# Patient Record
Sex: Female | Born: 1960 | Race: White | Hispanic: No | Smoking: Never smoker
Health system: Southern US, Community
[De-identification: ages and names within clinical notes are randomized; demographics above are authoritative.]

## PROBLEM LIST (undated history)

## (undated) DIAGNOSIS — M5030 Other cervical disc degeneration, unspecified cervical region: Secondary | ICD-10-CM

## (undated) DIAGNOSIS — N19 Unspecified kidney failure: Secondary | ICD-10-CM

## (undated) DIAGNOSIS — N289 Disorder of kidney and ureter, unspecified: Secondary | ICD-10-CM

## (undated) DIAGNOSIS — R569 Unspecified convulsions: Secondary | ICD-10-CM

## (undated) DIAGNOSIS — M961 Postlaminectomy syndrome, not elsewhere classified: Secondary | ICD-10-CM

## (undated) DIAGNOSIS — R519 Headache, unspecified: Secondary | ICD-10-CM

## (undated) DIAGNOSIS — C649 Malignant neoplasm of unspecified kidney, except renal pelvis: Secondary | ICD-10-CM

## (undated) DIAGNOSIS — K219 Gastro-esophageal reflux disease without esophagitis: Secondary | ICD-10-CM

## (undated) DIAGNOSIS — R011 Cardiac murmur, unspecified: Secondary | ICD-10-CM

## (undated) DIAGNOSIS — C679 Malignant neoplasm of bladder, unspecified: Secondary | ICD-10-CM

## (undated) DIAGNOSIS — M1990 Unspecified osteoarthritis, unspecified site: Secondary | ICD-10-CM

## (undated) DIAGNOSIS — G9050 Complex regional pain syndrome I, unspecified: Secondary | ICD-10-CM

## (undated) DIAGNOSIS — F329 Major depressive disorder, single episode, unspecified: Secondary | ICD-10-CM

## (undated) DIAGNOSIS — M797 Fibromyalgia: Secondary | ICD-10-CM

## (undated) DIAGNOSIS — F32A Depression, unspecified: Secondary | ICD-10-CM

## (undated) DIAGNOSIS — I10 Essential (primary) hypertension: Secondary | ICD-10-CM

## (undated) DIAGNOSIS — G500 Trigeminal neuralgia: Secondary | ICD-10-CM

## (undated) DIAGNOSIS — K259 Gastric ulcer, unspecified as acute or chronic, without hemorrhage or perforation: Secondary | ICD-10-CM

## (undated) DIAGNOSIS — K589 Irritable bowel syndrome without diarrhea: Secondary | ICD-10-CM

## (undated) DIAGNOSIS — F419 Anxiety disorder, unspecified: Secondary | ICD-10-CM

## (undated) DIAGNOSIS — E785 Hyperlipidemia, unspecified: Secondary | ICD-10-CM

## (undated) HISTORY — DX: Anxiety disorder, unspecified: F41.9

## (undated) HISTORY — DX: Hyperlipidemia, unspecified: E78.5

## (undated) HISTORY — DX: Gastro-esophageal reflux disease without esophagitis: K21.9

## (undated) HISTORY — DX: Complex regional pain syndrome I, unspecified: G90.50

## (undated) HISTORY — DX: Irritable bowel syndrome, unspecified: K58.9

## (undated) HISTORY — PX: CHOLECYSTECTOMY: SHX55

## (undated) HISTORY — DX: Depression, unspecified: F32.A

## (undated) HISTORY — DX: Essential (primary) hypertension: I10

## (undated) HISTORY — DX: Fibromyalgia: M79.7

## (undated) HISTORY — DX: Trigeminal neuralgia: G50.0

## (undated) HISTORY — DX: Gastric ulcer, unspecified as acute or chronic, without hemorrhage or perforation: K25.9

## (undated) HISTORY — DX: Unspecified osteoarthritis, unspecified site: M19.90

## (undated) HISTORY — DX: Postlaminectomy syndrome, not elsewhere classified: M96.1

## (undated) HISTORY — DX: Cardiac murmur, unspecified: R01.1

## (undated) HISTORY — DX: Unspecified kidney failure: N19

## (undated) HISTORY — DX: Unspecified convulsions: R56.9

## (undated) HISTORY — DX: Malignant neoplasm of unspecified kidney, except renal pelvis: C64.9

## (undated) HISTORY — PX: TRANSURETHRAL RESECTION OF BLADDER TUMOR WITH GYRUS (TURBT-GYRUS): SHX6458

## (undated) HISTORY — DX: Other cervical disc degeneration, unspecified cervical region: M50.30

## (undated) HISTORY — DX: Malignant neoplasm of bladder, unspecified: C67.9

## (undated) HISTORY — DX: Major depressive disorder, single episode, unspecified: F32.9

---

## 1993-04-29 HISTORY — PX: BACK SURGERY: SHX140

## 2002-04-29 HISTORY — PX: BRAIN SURGERY: SHX531

## 2002-04-29 HISTORY — PX: SPINAL CORD STIMULATOR IMPLANT: SHX2422

## 2002-04-29 HISTORY — PX: OTHER SURGICAL HISTORY: SHX169

## 2004-04-29 HISTORY — PX: OTHER SURGICAL HISTORY: SHX169

## 2009-04-29 HISTORY — PX: NEPHROURETERECTOMY: SHX5135

## 2015-04-30 HISTORY — PX: BREAST BIOPSY: SHX20

## 2018-01-08 ENCOUNTER — Ambulatory Visit (INDEPENDENT_AMBULATORY_CARE_PROVIDER_SITE_OTHER): Payer: Medicaid Other | Admitting: Urology

## 2018-01-08 ENCOUNTER — Encounter: Payer: Self-pay | Admitting: Urology

## 2018-01-08 VITALS — BP 145/78 | HR 91 | Ht 64.0 in | Wt 193.0 lb

## 2018-01-08 DIAGNOSIS — R32 Unspecified urinary incontinence: Secondary | ICD-10-CM

## 2018-01-08 DIAGNOSIS — C689 Malignant neoplasm of urinary organ, unspecified: Secondary | ICD-10-CM

## 2018-01-08 LAB — URINALYSIS, COMPLETE
BILIRUBIN UA: NEGATIVE
Glucose, UA: NEGATIVE
Ketones, UA: NEGATIVE
Leukocytes, UA: NEGATIVE
NITRITE UA: NEGATIVE
PH UA: 7 (ref 5.0–7.5)
Protein, UA: NEGATIVE
RBC UA: NEGATIVE
SPEC GRAV UA: 1.02 (ref 1.005–1.030)
Urobilinogen, Ur: 0.2 mg/dL (ref 0.2–1.0)

## 2018-01-08 LAB — BLADDER SCAN AMB NON-IMAGING

## 2018-01-08 MED ORDER — MIRABEGRON ER 25 MG PO TB24: 25.0000 mg | ORAL_TABLET | Freq: Every day | ORAL | 11 refills | Status: DC

## 2018-01-08 NOTE — Progress Notes (Addendum)
01/08/2018 3:08 PM   Lindsay Austin 1960/09/15 [id number]  Referring provider: Tandy Gaw, [address] [place], [place] [address]  CC: Establish care, history of urothelial cell cancer  HPI: I had the pleasure of seeing Lindsay Austin in urology clinic today in consultation for history of urothelial cell cancer from [name], [place].  She recently moved to the area from [place].  Her urologic history is notable for a left nephroureterectomy in 2011 at [facility] for presumed urothelial cell cancer.  Her pathology is not available to me currently, but we will obtain these records.  She also underwent follow-up TURBT in 2012 and 2013, as well as BCG.  She had been on surveillance with cystoscopy every 6 months, and CT scan yearly.  Her last CT scan was October 2018, she is also due for cystoscopy.  She denies any gross hematuria or flank pain.  She is a non-smoker.  She also reports urinary frequency and stress incontinence.  She denies urge incontinence.  She has been on Myrbetriq which she feels like helps her urgency significantly.  She reportedly had been followed for a cystocele and rectocele by a uro-gynecologist at [facility], and was planning to undergo surgery for this prior to her move.  Her primary complaint is feeling of pelvic pressure and prolapse.   PMH: Past Medical History:  Diagnosis Date  . Anxiety   . Arthritis   . Bladder cancer ([place])   . Cancer of kidney ([place])   . Depression   . GERD (gastroesophageal reflux disease)   . Heart murmur   . Hyperlipemia   . Hypertension   . Kidney failure   . Seizure ([place])   . Stomach ulcer     Surgical History: Past Surgical History:  Procedure Laterality Date  . NEPHROURETERECTOMY  2011  . TRANSURETHRAL RESECTION OF BLADDER TUMOR WITH GYRUS (TURBT-GYRUS)  2012, 2013    Allergies: No Known Allergies  Family History: History reviewed. No pertinent family history.  Social History:  reports that she has never  smoked. She has never used smokeless tobacco. She reports that she does not drink alcohol or use drugs.  ROS: Please see flowsheet from today's date for complete review of systems.  Physical Exam: BP (!) 145/78   Pulse 91   Ht 5\' 4"  (1.626 m)   Wt 193 lb (87.5 kg)   BMI 33.13 kg/m    Constitutional:  Alert and oriented, No acute distress. Cardiovascular: No clubbing, cyanosis, or edema. Respiratory: Normal respiratory effort, no increased work of breathing. GI: Abdomen is soft, nontender, nondistended, no abdominal masses GU: No CVA tenderness Lymph: No cervical or inguinal lymphadenopathy. Skin: No rashes, bruises or suspicious lesions. Neurologic: Grossly intact, no focal deficits, moving all 4 extremities. Psychiatric: Normal mood and affect.  Laboratory Data: Urinalysis today 9.  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  We will need to obtain her records and pathology from [facility].  She is due for surveillance with both cystoscopy and CT scan.  Additionally, she reports pelvic pressure and prolapse with previously diagnosed cystocele and rectocele, as well as stress incontinence.  She does desire intervention for this.  1. 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 See Dr. McDiarmid [id number], if he recommends surgical repair, may consider concomitant sling in setting of her simultaneous bothersome stress incontinence  ADDENDUM: We reviewed her 04/03/2018 CT urogram results, 1.3cm omental lesion of unclear significance. Need to compare to prior imaging. Her original path in 2011 was HG UCC 2cm, with  focal invasion into subepithelial tissue and negative margins. We are still attempting to obtain outside imaging results and will evaluate the stability of this omental lesion. If new, will require biopsy to r/o urothelial cell cancer recurrence.   Billey Co, [facility] [address] [place], [place] [address] [phone]

## 2018-01-15 ENCOUNTER — Ambulatory Visit
Payer: Medicaid Other | Attending: Student in an Organized Health Care Education/Training Program | Admitting: Student in an Organized Health Care Education/Training Program

## 2018-01-15 ENCOUNTER — Encounter: Payer: Self-pay | Admitting: Student in an Organized Health Care Education/Training Program

## 2018-01-15 ENCOUNTER — Other Ambulatory Visit: Payer: Self-pay

## 2018-01-15 VITALS — BP 165/105 | HR 102 | Temp 98.1°F | Resp 16 | Ht 64.0 in | Wt 179.0 lb

## 2018-01-15 DIAGNOSIS — M5030 Other cervical disc degeneration, unspecified cervical region: Secondary | ICD-10-CM | POA: Diagnosis not present

## 2018-01-15 DIAGNOSIS — Z79899 Other long term (current) drug therapy: Secondary | ICD-10-CM | POA: Insufficient documentation

## 2018-01-15 DIAGNOSIS — M25551 Pain in right hip: Secondary | ICD-10-CM | POA: Insufficient documentation

## 2018-01-15 DIAGNOSIS — Z888 Allergy status to other drugs, medicaments and biological substances status: Secondary | ICD-10-CM | POA: Insufficient documentation

## 2018-01-15 DIAGNOSIS — Q761 Klippel-Feil syndrome: Secondary | ICD-10-CM | POA: Insufficient documentation

## 2018-01-15 DIAGNOSIS — M549 Dorsalgia, unspecified: Secondary | ICD-10-CM | POA: Diagnosis not present

## 2018-01-15 DIAGNOSIS — I10 Essential (primary) hypertension: Secondary | ICD-10-CM | POA: Diagnosis not present

## 2018-01-15 DIAGNOSIS — M797 Fibromyalgia: Secondary | ICD-10-CM | POA: Insufficient documentation

## 2018-01-15 DIAGNOSIS — M25552 Pain in left hip: Secondary | ICD-10-CM | POA: Insufficient documentation

## 2018-01-15 DIAGNOSIS — Z8551 Personal history of malignant neoplasm of bladder: Secondary | ICD-10-CM | POA: Insufficient documentation

## 2018-01-15 DIAGNOSIS — Z85528 Personal history of other malignant neoplasm of kidney: Secondary | ICD-10-CM | POA: Insufficient documentation

## 2018-01-15 DIAGNOSIS — F329 Major depressive disorder, single episode, unspecified: Secondary | ICD-10-CM | POA: Diagnosis not present

## 2018-01-15 DIAGNOSIS — Z981 Arthrodesis status: Secondary | ICD-10-CM | POA: Insufficient documentation

## 2018-01-15 DIAGNOSIS — G9050 Complex regional pain syndrome I, unspecified: Secondary | ICD-10-CM | POA: Insufficient documentation

## 2018-01-15 DIAGNOSIS — F419 Anxiety disorder, unspecified: Secondary | ICD-10-CM | POA: Insufficient documentation

## 2018-01-15 DIAGNOSIS — G894 Chronic pain syndrome: Secondary | ICD-10-CM | POA: Insufficient documentation

## 2018-01-15 DIAGNOSIS — K589 Irritable bowel syndrome without diarrhea: Secondary | ICD-10-CM | POA: Insufficient documentation

## 2018-01-15 DIAGNOSIS — T85192S Other mechanical complication of implanted electronic neurostimulator (electrode) of spinal cord, sequela: Secondary | ICD-10-CM | POA: Diagnosis not present

## 2018-01-15 DIAGNOSIS — Z9889 Other specified postprocedural states: Secondary | ICD-10-CM | POA: Diagnosis not present

## 2018-01-15 DIAGNOSIS — E785 Hyperlipidemia, unspecified: Secondary | ICD-10-CM | POA: Diagnosis not present

## 2018-01-15 DIAGNOSIS — K219 Gastro-esophageal reflux disease without esophagitis: Secondary | ICD-10-CM | POA: Diagnosis not present

## 2018-01-15 NOTE — Progress Notes (Signed)
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 cervical spine fusion syndrome.  Of note patient has an impressive surgical history with a L5-S1 lumbar discectomy 1995 followed by L5-S1 lumbar fusion in 1998.  Patient had a spinal cord stimulator placed in 1999 with revision in 2000 and subsequent removal in November 2000 for cellulitis and associated complications.  She went on to have a thoracic laminectomy in March 2001.  These were done by Belzberg and Dr. Davis at [facility].  During her SCS revision, she did experience cellulitis secondary to staph infection.  Patient also has a history of trigeminal neuralgia status post gamma knife radiation in 2005.  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  Patient also has a history of a bladder tumor has had subsequent bladder biopsies after bladder tumor removal which were negative.  In 2018 patient went on to have a C4-C7 cervical fusion  for symptoms of cervical radiculopathy.  She states prior to the cervical fusion she did undergo 2 sets of cervical facet medial branch nerve blocks at C4, 5, 6, 7 which were beneficial for axial neck pain.  She never went on to have radiofrequency ablation.  In regards to medication management, patient has been on very high-dose opioid therapy in the past which she is currently weaning herself down from.  In the past she was on MS Contin 200 mg 3 times daily which she has reduced down to 200 mg daily.  She was also on Robaxin in the past.  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  She was on Lexapro 10 mg for her depression.  Patient denies any bowel or bladder dysfunction.  She denies ketamine infusion.  I had an extensive discussion with the patient regarding my concerns about her high dose MME.  Patient was previously on 200 mg of morphine up to 3 times a day but has weaned herself down to 200 mg daily.  She does not have any fills in the [place] PMP.  She states that she has been utilizing medications particularly morphine that she had in the past.   We had a discussion about opioid-induced hyperalgesia as well as tolerance.  Was very honest with the patient and told her that if she were a candidate for opioid therapy at this clinic it would be no more than 120 MME's.  This is a significant reduction since the patient was previously on 600 MME's in the past.  Before considering the patient for chronic opioid therapy, I will send the patient for substance abuse evaluation by way of pain psychology.  If no concerns from that evaluation and so long as UDS is appropriate, can consider taking patient on for therapy.  Patient has expressed interest in repeating cervical facet blocks and going through with cervical facet radiofrequency ablation which she was unable to do in [place].  Plan of Care (Initial workup plan)  Note: Please be advised that as per protocol, today's  visit has been an evaluation only. We have not taken over the patient's controlled substance management.  Ordered Lab-work, Procedure(s),   Referral(s), & Consult(s): Orders Placed This Encounter  Procedures  . Compliance Drug Analysis, Ur  . Ambulatory referral to Psychology   Pharmacotherapy (current): Medications ordered:  No orders of the defined types were placed in this encounter.  Medications administered during this visit: Litzi Suitt had no medications administered during this visit.   Pharmacological management options:  Opioid Analgesics: The patient was informed that there is no guarantee that she would be a candidate for opioid analgesics. The decision will be made following CDC guidelines. This decision will be based on the results of diagnostic studies, as well as Lindsay Austin's risk profile.   Membrane stabilizer: To be determined at a later time  Muscle relaxant: To be determined at a later time  NSAID: To be determined at a later time  Other analgesic(s): To be determined at a later time   Interventional management options: Ms. Bogdanski was informed that there is no guarantee that she would be a candidate for interventional therapies. The decision will be based on the results of diagnostic studies, as well as Lindsay Austin's risk profile.  Procedure(s) under consideration:  Cervical facet medial branch nerve blocks with possible cervical radiofrequency ablation at C4, 5, 6, 7 Caudal ESI Cervical thoracic trigger point injections   Provider-requested follow-up: Return in about 5 weeks (around 02/19/2018) for After Psychological evaluation.  Future Appointments  Date Time Provider Department Center  01/28/2018  3:30 PM Sninsky, Brian C, MD [facility] None  02/09/2018  2:00 PM MacDiarmid, Scott, MD [facility] None    Primary Care Physician: Markly, Linda, PA Location: [facility] Note by: Bilal Lateef, M.D, Date: 01/15/2018; Time: 11:51 AM  Patient  Instructions  1. Look into buprenorphine and or butrans 2.UDS 3. Referral to pain psych for initial evaluation   

## 2018-01-15 NOTE — Patient Instructions (Signed)
1. Look into buprenorphine and or butrans 2.UDS 3. Referral to pain psych for initial evaluation

## 2018-01-15 NOTE — Progress Notes (Signed)
Safety precautions to be maintained throughout the outpatient stay will include: orient to surroundings, keep bed in low position, maintain call bell within reach at all times, provide assistance with transfer out of bed and ambulation.  

## 2018-01-15 NOTE — Progress Notes (Deleted)
Patient's Name: Lindsay Austin  01/15/2018  [place] [address]    DOB: [birth year]    MRN: [id number]        Re: Results of urine drug screening test and change in treatment plan.     Dear [name]:  The Test: We do not use a POC (Point-of-care) immunoassay testing urine drug screen (UDS) test, due to the fact that for illicit drugs, the false-positive rate is 0% for cocaine, 2% for marijuana, 0.9% for amphetamines, and 1.2% for methamphetamines. Instead, our facility uses a forensic state-of-the-art ultra-high performance liquid chromatography and mass spectrometry system (UPLC/MS-MS), the most sophisticated and accurate method currently available. It is 1,000 times more precise and accurate than standard gas chromatography and mass spectrometry (GC/MS) testing. This is the test used to detect false positive and false negative results in immunoassay testing. The system can analyze 26 drug categories and 180 drug compounds. It will not only detect the presence of a parent compound its break down products (metabolites), but it will also provide the exact amounts, down to nanograms (1/1,000,000 of a milligram).  Findings: Findings (results) of this test are reported as abnormal or unexpected when there is a discrepancy or inconsistency between the expected results and those detected during testing. Expectations are based on the medication history provided by the patient at the time of sample collection.  Abnormal Results:  These generally fall under one of two primary categories: Category 1: Substances reported to be taken, but found to be absent: If you are being prescribed a pain medication, it should be detected in your urine sample. Not detecting the medication indicates that the medication is not in the system and has not been taken for a couple of days. Abnormal results under this category may suggest one of the following two possible medication agreement violation(s): 1).  Opioid diversion:  (Selling, lending, or giving medications to an individual for whom the medication was not intended or prescribed.) 2). Non-compliance with prescription instructions on how to take the medication. (Taking more medication than allowed.) Category 2: An unreported substance is found to be present: These results may suggest the following medication agreement violation(s): 1). The illegal use of a substance. This includes the use of illegal substances (such as cocaine, marijuana, PCP, etc.) or the illegal use of a legal prescription drug (such as using a medication that was not prescribed to you or for you; or purchasing a prescription drug from a street dealer or a friend). 2). Obtaining medications from multiple sources. (i.e.: "Doctor shopping")  Medication Policy: When it comes to illicit or illegal use, I have a "Zero Tolerance" policy.  Your Results: ***  Determination: The findings of this UDT are unacceptable and incompatible with appropriate, safe, and responsible use of controlled substances. These results are  incompatible with our medication policy, as well as our opioid treatment agreement. This represents non-compliance with the safe use of controlled substances.  To ensure your safety, I am modifying you treatment plan as follows:  New Treatment Plan: Effective immediately, you are no longer a candidate for opioid, or opioid-related medication management. This means that effective today, I will not be prescribing any opioid or opioid-related pain medication as part of your treatment. Your new treatment plan will exclude the use of any and all controlled substances. This decision  is based on my belief that the risks outweigh the benefits.  Appeals: This decision is final, irreversible, and not open for debate. This represents  a change in your treatment plan and not a discharge from our practice. Should you decide that what we have to offer is not in accordance to your  wishes, you have the right to transfer your care to another practice or facility. From this point on, we will remain available to you for injection therapies only.  If you are already taking pain medications, I recommend that you immediately begin tapering them down, so as to stop them. I recommend decreasing the number of pills you take on a daily basis, by one pill every seven (7) days. If you do not believe that you can do this on your own, please contact one of the following drug rehabilitation centers below:  [facility] [address], [place], [place] [address] Tel.: [phone]  [facility] [address] [place], [place] [address] Tel.: [phone]      [facility] [place], [place], [place] [address] Tel.: [phone]  [facility] [address] [place], [place] [address] Tel.: [phone]      [facility] [address], [place], [place] [address] Tel.: [phone]  [place], [place], [place] [address] Tel.: [phone]   Most sincerely,  ________________________ Beatriz Chancellor A. Dossie Arbour M.D. Pain Management

## 2018-01-21 LAB — COMPLIANCE DRUG ANALYSIS, UR

## 2018-01-26 ENCOUNTER — Other Ambulatory Visit: Payer: Self-pay | Admitting: Family Medicine

## 2018-01-26 DIAGNOSIS — Z1231 Encounter for screening mammogram for malignant neoplasm of breast: Secondary | ICD-10-CM

## 2018-01-27 ENCOUNTER — Encounter: Payer: Self-pay | Admitting: Family Medicine

## 2018-01-28 ENCOUNTER — Other Ambulatory Visit: Payer: Medicaid Other | Admitting: Urology

## 2018-02-04 ENCOUNTER — Ambulatory Visit
Admission: RE | Admit: 2018-02-04 | Discharge: 2018-02-04 | Disposition: A | Discharge status: Home / Self Care | Payer: Self-pay | Source: Ambulatory Visit | Attending: Urology | Admitting: Urology

## 2018-02-04 DIAGNOSIS — C689 Malignant neoplasm of urinary organ, unspecified: Secondary | ICD-10-CM

## 2018-02-05 ENCOUNTER — Encounter: Payer: Self-pay | Admitting: Psychology

## 2018-02-09 ENCOUNTER — Other Ambulatory Visit: Payer: Self-pay | Admitting: Urology

## 2018-02-18 ENCOUNTER — Encounter: Payer: Self-pay | Admitting: *Deleted

## 2018-03-18 ENCOUNTER — Encounter: Payer: Medicaid Other | Admitting: Nurse Practitioner

## 2018-04-01 ENCOUNTER — Encounter: Payer: Self-pay | Admitting: Gastroenterology

## 2018-04-01 ENCOUNTER — Ambulatory Visit (INDEPENDENT_AMBULATORY_CARE_PROVIDER_SITE_OTHER): Payer: Medicaid Other | Admitting: Gastroenterology

## 2018-04-01 VITALS — BP 147/78 | HR 81 | Ht 64.0 in | Wt 172.0 lb

## 2018-04-01 DIAGNOSIS — K219 Gastro-esophageal reflux disease without esophagitis: Secondary | ICD-10-CM

## 2018-04-01 NOTE — Progress Notes (Signed)
Lindsay Antigua, MD [address]  [place]  [place], [place] [address]  Main: [phone]  Fax: [phone]   Primary Care Physician: Tandy Gaw, [place]  Primary Gastroenterologist:  Dr. Vonda Austin  Chief Complaint  Patient presents with  . New Patient (Initial Visit)    referral from [place] [facility] for screening colonoscopy    HPI: Lindsay Austin is a 57 y.o. female is here to discuss a screening colonoscopy.  Patient reports history of one colonoscopy done 5 or 6 years ago in [place] and states polyps were removed.  She does not know any further details about the results or types of polyps.  She does not have the procedure report or pathology report.  Denies any family history of colon cancer.  Denies any blood in her stool.  Good appetite.  No nausea or vomiting.  Reports also having an EGD at the same time of her colonoscopy and thinks that it was done due to heartburn.  Reports intermittent dysphagia, very occasional.  It is usually able to eat solids and liquids without difficulty, but will have occasional symptoms, such as last episode was a year ago where she was swallowing steak and it felt like it got stuck and she massaged her throat and it went down.  No episodes of food impaction.  No prior dilations.  Takes Carafate daily and states it was given to her due to heartburn.  Denies any breakthrough symptoms.  Not on any acid reducers.  Current Outpatient Medications  Medication Sig Dispense Refill  . buPROPion (WELLBUTRIN SR) 150 MG 12 hr tablet Take 150 mg by mouth 2 (two) times daily.    Marland Kitchen dicyclomine (BENTYL) 20 MG tablet Take 20 mg by mouth 3 (three) times daily.    Marland Kitchen escitalopram (LEXAPRO) 10 MG tablet TAKE 1 TABLET BY MOUTH EVERY DAY FOR MOOD  2  . methocarbamol (ROBAXIN) 750 MG tablet Take 750 mg by mouth 2 (two) times daily as needed.  2  . Morphine Sulfate ER (MS CONTIN) 200 MG TBCR Take 200 mg by mouth daily.    . ondansetron (ZOFRAN-ODT) 4 MG  disintegrating tablet Take 4 mg by mouth every 8 (eight) hours as needed. for nausea  2  . oxybutynin (DITROPAN) 5 MG tablet Take 5 mg by mouth 3 (three) times daily.    . sucralfate (CARAFATE) 1 g tablet Take 1 g by mouth 2 (two) times daily.    . traZODone (DESYREL) 50 MG tablet TAKE 2 TABLETS BY MOUTH NIGHTLY AT BEDTIME  2  . Milnacipran (SAVELLA) 50 MG TABS tablet Take 50 mg by mouth 2 (two) times daily.    . mirabegron ER (MYRBETRIQ) 25 MG TB24 tablet Take 1 tablet (25 mg total) by mouth daily. (Patient not taking: Reported on 04/01/2018) 30 tablet 11   No current facility-administered medications for this visit.     Allergies as of 04/01/2018 - Review Complete 04/01/2018  Allergen Reaction Noted  . Methadone Palpitations 01/15/2018  . Other Swelling and Other (See Comments) 01/15/2018  . Tape Other (See Comments) 01/15/2018    ROS:  General: Negative for anorexia, weight loss, fever, chills, fatigue, weakness. ENT: Negative for hoarseness, difficulty swallowing , nasal congestion. CV: Negative for chest pain, angina, palpitations, dyspnea on exertion, peripheral edema.  Respiratory: Negative for dyspnea at rest, dyspnea on exertion, cough, sputum, wheezing.  GI: See history of present illness. GU:  Negative for dysuria, hematuria, urinary incontinence, urinary frequency, nocturnal urination.  Endo: Negative  for unusual weight change.    Physical Examination:   BP (!) 147/78   Pulse 81   Ht 5\' 4"  (1.626 m)   Wt 172 lb (78 kg)   BMI 29.52 kg/m   General: Well-nourished, well-developed in no acute distress.  Eyes: No icterus. Conjunctivae pink. Mouth: Oropharyngeal mucosa moist and pink , no lesions erythema or exudate. Neck: Supple, Trachea midline Abdomen: Bowel sounds are normal, nontender, nondistended, no hepatosplenomegaly or masses, no abdominal bruits or hernia , no rebound or guarding.   Extremities: No lower extremity edema. No clubbing or deformities. Neuro:  Alert and oriented x 3.  Grossly intact. Skin: Warm and dry, no jaundice.   Psych: Alert and cooperative, normal mood and affect.   Labs: CMP  No results found for: NA, K, CL, CO2, GLUCOSE, BUN, CREATININE, CALCIUM, PROT, ALBUMIN, AST, ALT, ALKPHOS, BILITOT, GFRNONAA, GFRAA No results found for: WBC, HGB, HCT, MCV, PLT  Imaging Studies: No results found.  Assessment and Plan:   [name] is a 57 y.o. y/o female referred for evaluation of screening colonoscopy and intermittent dysphagia  Patient is agreeable to proceeding with screening colonoscopy Due to intermittent dysphagia and EGD was also discussed and she is agreeable with proceeding  Given how infrequent her symptoms she may just have globus sensation and not true dysphagia However, will rule out any underlying lesions with EGD and obtain biopsies for EOE  We also discussed that the Carafate is not for heartburn and if there are no abnormalities in her esophagus to indicate Carafate she can discontinue it and she verbalized understanding  I have discussed alternative options, risks & benefits,  which include, but are not limited to, bleeding, infection, perforation,respiratory complication & drug reaction.  The patient agrees with this plan & written consent will be obtained.       Dr Lindsay Austin

## 2018-04-02 ENCOUNTER — Ambulatory Visit
Admission: RE | Admit: 2018-04-02 | Discharge: 2018-04-02 | Disposition: A | Discharge status: Home / Self Care | Payer: Medicaid Other | Source: Ambulatory Visit | Attending: Urology | Admitting: Urology

## 2018-04-02 DIAGNOSIS — C689 Malignant neoplasm of urinary organ, unspecified: Secondary | ICD-10-CM | POA: Diagnosis not present

## 2018-04-02 HISTORY — DX: Disorder of kidney and ureter, unspecified: N28.9

## 2018-04-02 LAB — POCT I-STAT CREATININE: Creatinine, Ser: 0.8 mg/dL (ref 0.44–1.00)

## 2018-04-02 MED ORDER — IOPAMIDOL (ISOVUE-300) INJECTION 61%: 125.0000 mL | Freq: Once | INTRAVENOUS | Status: DC | PRN

## 2018-04-02 MED ORDER — IOPAMIDOL (ISOVUE-300) INJECTION 61%
100.0000 mL | Freq: Once | INTRAVENOUS | Status: AC | PRN
  Administered 2018-04-02: 100 mL via INTRAVENOUS

## 2018-04-03 ENCOUNTER — Other Ambulatory Visit: Payer: Self-pay

## 2018-04-03 DIAGNOSIS — R1310 Dysphagia, unspecified: Secondary | ICD-10-CM

## 2018-04-03 DIAGNOSIS — Z1211 Encounter for screening for malignant neoplasm of colon: Secondary | ICD-10-CM

## 2018-04-03 DIAGNOSIS — K219 Gastro-esophageal reflux disease without esophagitis: Secondary | ICD-10-CM

## 2018-04-06 ENCOUNTER — Ambulatory Visit: Payer: Medicaid Other | Admitting: Urology

## 2018-04-06 ENCOUNTER — Encounter: Payer: Self-pay | Admitting: Urology

## 2018-04-06 VITALS — BP 176/80 | HR 62 | Ht 64.0 in | Wt 171.1 lb

## 2018-04-06 DIAGNOSIS — N3946 Mixed incontinence: Secondary | ICD-10-CM

## 2018-04-06 DIAGNOSIS — C689 Malignant neoplasm of urinary organ, unspecified: Secondary | ICD-10-CM

## 2018-04-06 LAB — URINALYSIS, COMPLETE
BILIRUBIN UA: NEGATIVE
Glucose, UA: NEGATIVE
KETONES UA: NEGATIVE
Leukocytes, UA: NEGATIVE
Nitrite, UA: NEGATIVE
Protein, UA: NEGATIVE
RBC UA: NEGATIVE
SPEC GRAV UA: 1.02 (ref 1.005–1.030)
Urobilinogen, Ur: 0.2 mg/dL (ref 0.2–1.0)
pH, UA: 6 (ref 5.0–7.5)

## 2018-04-06 LAB — MICROSCOPIC EXAMINATION
RBC, UA: NONE SEEN /hpf (ref 0–2)
WBC, UA: NONE SEEN /hpf (ref 0–5)

## 2018-04-06 NOTE — Progress Notes (Signed)
04/06/2018 1:57 PM   [name] May 28, 1960 [id number]  Referring provider: Tandy Gaw, [place],  [address]  Chief Complaint  Patient presents with  . Cysto    HPI: Dr Bethann Goo: Patient has been followed for urothelial carcinoma and has had a left nephroureterectomy and BCG dating back 2013.  She is on Myrbetriq for incontinence and is known to have prolapse as noted  The patient leaks with coughing sneezing and bending but not every time.  She has urge incontinence.  She has no bedwetting.  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  Both components are significant especially during coughing season.  She voids 2 or 3 times a night and voids every 2 hours during the day.  She has had 2 recent bladder infections.  She has not had previous bladder surgery.  She describes feeling fullness in the vagina but was nonspecific.  She did report pressing her hand in the vagina and even digitally to pass reasonably normal bowel movements.  She thinks there is a shelf.  She tries to avoid sitting but I felt it was more from her back and not from vaginal symptoms.  Modifying factors: There are no other modifying factors  Associated signs and symptoms: There are no other associated signs and symptoms Aggravating and relieving factors: There are no other aggravating or relieving factors Severity: Moderate Duration: Persistent  On pelvic examination at rest she had a small grade 1 cystocele.  When she would bear down she would have rotational descent of the anterior vaginal wall with a grade 2 cystocele that was smaller.  Her uterus descended from 8 or 9 cm to approximately 6 cm.  She had a small distal grade 2 rectocele.  She leaked a few drops with coughing.  Cystoscopy: She underwent flexible cystoscopy utilizing sterile technique.  Bladder mucosa and trigone were normal.  There is no carcinoma.  There is no carcinoma in situ.  There is no cystitis.   PMH: Past  Medical History:  Diagnosis Date  . Anxiety   . Arthritis   . Bladder cancer ([place])   . Cancer of kidney ([place])   . DDD (degenerative disc disease), cervical   . Depression   . Failed back syndrome   . Fibromyalgia   . GERD (gastroesophageal reflux disease)   . Heart murmur   . Hyperlipemia   . Hypertension   . IBS (irritable bowel syndrome)   . Kidney failure   . Renal insufficiency   . RSD (reflex sympathetic dystrophy)   . Seizure ([place])   . Stomach ulcer   . Trigeminal neuralgia     Surgical History: Past Surgical History:  Procedure Laterality Date  . NEPHROURETERECTOMY  2011  . TRANSURETHRAL RESECTION OF BLADDER TUMOR WITH GYRUS (TURBT-GYRUS)  2012, 2013    Home Medications:  Allergies as of 04/06/2018      Reactions   Methadone Palpitations   Other Swelling, Other (See Comments)   Tricyclic antidepressants Unable to void   Tape Other (See Comments)   blisters      Medication List        Accurate as of 04/06/18  1:57 PM. Always use your most recent med list.          dicyclomine 20 MG tablet Commonly known as:  BENTYL Take 20 mg by mouth 3 (three) times daily.   escitalopram 10 MG tablet Commonly known as:  LEXAPRO TAKE 1 TABLET BY MOUTH EVERY DAY FOR  MOOD   methocarbamol 750 MG tablet Commonly known as:  ROBAXIN Take 750 mg by mouth 2 (two) times daily as needed.   mirabegron ER 25 MG Tb24 tablet Commonly known as:  MYRBETRIQ Take 1 tablet (25 mg total) by mouth daily.   MS CONTIN 200 MG Tbcr Generic drug:  Morphine Sulfate ER Take 200 mg by mouth daily.   ondansetron 4 MG disintegrating tablet Commonly known as:  ZOFRAN-ODT Take 4 mg by mouth every 8 (eight) hours as needed. for nausea   oxybutynin 5 MG tablet Commonly known as:  DITROPAN Take 5 mg by mouth 3 (three) times daily.   SAVELLA 50 MG Tabs tablet Generic drug:  Milnacipran Take 50 mg by mouth 2 (two) times daily.   sucralfate 1 g tablet Commonly known as:  CARAFATE Take  1 g by mouth 2 (two) times daily.   traZODone 50 MG tablet Commonly known as:  DESYREL TAKE 2 TABLETS BY MOUTH NIGHTLY AT BEDTIME   WELLBUTRIN SR 150 MG 12 hr tablet Generic drug:  buPROPion Take 150 mg by mouth 2 (two) times daily.       Allergies:  Allergies  Allergen Reactions  . Methadone Palpitations  . Other Swelling and Other (See Comments)    Tricyclic antidepressants Unable to void   . Tape Other (See Comments)    blisters    Family History: Family History  Problem Relation Age of Onset  . Diabetes Mother   . Heart disease Mother   . Dementia Father   . Kidney disease Father     Social History:  reports that she has never smoked. She has never used smokeless tobacco. She reports that she does not drink alcohol or use drugs.  ROS:                                        Physical Exam: BP (!) 176/80 (BP Location: Left Arm, Patient Position: Sitting, Cuff Size: Normal)   Pulse 62   Ht 5\' 4"  (1.626 m)   Wt 171 lb 1.6 oz (77.6 kg)   BMI 29.37 kg/m   Constitutional:  Alert and oriented, No acute distress.   Laboratory Data: No results found for: WBC, HGB, HCT, MCV, PLT  Lab Results  Component Value Date   CREATININE 0.80 04/02/2018    No results found for: PSA  No results found for: TESTOSTERONE  No results found for: HGBA1C  Urinalysis    Component Value Date/Time   APPEARANCEUR Clear 04/06/2018 1313   GLUCOSEU Negative 04/06/2018 1313   BILIRUBINUR Negative 04/06/2018 1313   PROTEINUR Negative 04/06/2018 1313   NITRITE Negative 04/06/2018 1313   LEUKOCYTESUR Negative 04/06/2018 1313    Pertinent Imaging:   Assessment & Plan: The patient has mixed incontinence and no question an overactive bladder.  She is frequency nocturia.  She has mild prolapse symptoms but does have rectocele symptoms.  The role of urodynamics discussed.  We will also look at the size of the cystocele during urodynamics.  A picture was drawn.   If she ever had surgery she would likely best benefit from a transvaginal hysterectomy with possible vault suspension cystocele repair and graft and rectocele repair.  If the uterus was left in situ it may be difficult to actually perform a cystocele because at rest she has very little cystocele defects.  Depending on goals her incontinence likely could  be managed nonsurgically.  Likely she had a hysterectomy with fixation of the cuff to the ureteral sacral ligament she would probably not need a vault suspension.  I again with the uterus removed she may not need a cystocele repair.  Based upon today's examination one could do the rectocele and not the cystocele if the findings were sent to during surgery.  She has more of off elasticity issue with descensus than a central defect.  Urodynamics were ordered.  1. Urothelial cancer ([place]) Mixed incontinence   - Urinalysis, Complete   No follow-ups on file.  Reece Packer, MD  [facility] [address] [place], [place] [address] [phone]

## 2018-04-09 ENCOUNTER — Telehealth: Payer: Self-pay | Admitting: Urology

## 2018-04-09 NOTE — Telephone Encounter (Signed)
Lm to cb to reschd UDS results app She was scheduled with sninksy for results and she soul have been with Macdiarmid. She has not been schd for UDS yet. Patient needs to cb once she has been scheduled.   Sharyn Lull

## 2018-04-20 ENCOUNTER — Encounter

## 2018-04-20 ENCOUNTER — Ambulatory Visit: Payer: Self-pay | Admitting: Psychology

## 2018-05-04 ENCOUNTER — Telehealth: Payer: Self-pay | Admitting: Gastroenterology

## 2018-05-04 NOTE — Telephone Encounter (Signed)
Pt left vm to r/s her procedure 05/13/18 she will not have a driver that day please call pt

## 2018-05-05 ENCOUNTER — Encounter: Payer: Self-pay | Admitting: Nurse Practitioner

## 2018-05-06 NOTE — Telephone Encounter (Signed)
Returned patients call LVM for her to call back to reschedule her Colonoscopy from 05/13/18 to another date. Will await for her call back to reschedule.  Thanks [employer]

## 2018-05-07 ENCOUNTER — Ambulatory Visit: Payer: Medicaid Other | Admitting: Urology

## 2018-05-13 ENCOUNTER — Ambulatory Visit: Admit: 2018-05-13 | Payer: Medicaid Other | Admitting: Gastroenterology

## 2018-05-13 ENCOUNTER — Other Ambulatory Visit: Payer: Self-pay | Admitting: Urology

## 2018-05-13 SURGERY — COLONOSCOPY WITH PROPOFOL
Anesthesia: General

## 2018-05-26 ENCOUNTER — Other Ambulatory Visit (HOSPITAL_COMMUNITY): Payer: Self-pay | Admitting: Acute Care

## 2018-05-26 ENCOUNTER — Other Ambulatory Visit: Payer: Self-pay | Admitting: Acute Care

## 2018-05-26 DIAGNOSIS — M545 Low back pain: Secondary | ICD-10-CM

## 2018-05-26 DIAGNOSIS — G8929 Other chronic pain: Secondary | ICD-10-CM

## 2018-05-26 DIAGNOSIS — R202 Paresthesia of skin: Principal | ICD-10-CM

## 2018-05-26 DIAGNOSIS — R200 Anesthesia of skin: Secondary | ICD-10-CM

## 2018-06-01 ENCOUNTER — Encounter: Payer: Self-pay | Admitting: Urology

## 2018-06-01 ENCOUNTER — Ambulatory Visit (INDEPENDENT_AMBULATORY_CARE_PROVIDER_SITE_OTHER): Payer: Medicaid Other | Admitting: Urology

## 2018-06-01 VITALS — BP 162/89 | HR 112 | Ht 64.0 in | Wt 163.0 lb

## 2018-06-01 DIAGNOSIS — N3946 Mixed incontinence: Secondary | ICD-10-CM | POA: Diagnosis not present

## 2018-06-01 MED ORDER — MIRABEGRON ER 50 MG PO TB24: 50.0000 mg | ORAL_TABLET | Freq: Every day | ORAL | 11 refills | Status: DC

## 2018-06-01 NOTE — Progress Notes (Signed)
06/01/2018 3:35 PM   Lindsay Austin [birth year] [id number]  Referring provider: Ranae Austin, [address], [place] [address]  Chief Complaint  Patient presents with  . UDS results    HPI: Dr Lindsay Austin: Patient has been followed for urothelial carcinoma and has had a left nephroureterectomy and BCG dating back 2013.  She is on Myrbetriq for incontinence and is known to have prolapse as noted  The patient leaks with coughing sneezing and bending but not every time.  She has urge incontinence.  She has no bedwetting.  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  Both components are significant especially during coughing season.  She voids 2 or 3 times a night and voids every 2 hours during the day.  She has had 2 recent bladder infections.  She has not had previous bladder surgery.  She describes feeling fullness in the vagina but was nonspecific.  She did report pressing her hand in the vagina and even digitally to pass reasonably normal bowel movements.  She thinks there is a shelf.  She tries to avoid sitting but I felt it was more from her back and not from vaginal symptoms.  On pelvic examination at rest she had a small grade 1 cystocele.  When she would bear down she would have rotational descent of the anterior vaginal wall with a grade 2 cystocele that was smaller.  Her uterus descended from 8 or 9 cm to approximately 6 cm.  She had a small distal grade 2 rectocele.  She leaked a few drops with coughing.  Cystoscopy: normal  The patient has mixed incontinence and no question an overactive bladder.  She is frequency nocturia.  She has mild prolapse symptoms but does have rectocele symptoms.  The role of urodynamics discussed.  We will also look at the size of the cystocele during urodynamics.  A picture was drawn.  If she ever had surgery she would likely best benefit from a transvaginal hysterectomy with possible vault suspension cystocele repair and graft and  rectocele repair.  If the uterus was left in situ it may be difficult to actually perform a cystocele because at rest she has very little cystocele defects.  Depending on goals her incontinence likely could be managed nonsurgically.  Likely she had a hysterectomy with fixation of the cuff to the ureteral sacral ligament she would probably not need a vault suspension.  I again with the uterus removed she may not need a cystocele repair.  Based upon today's examination one could do the rectocele and not the cystocele if the findings were sent to during surgery.  She has more of off elasticity issue with descensus than a central defect.  Urodynamics were ordered.  Today Continued incontinence are stable Frequency stable During urodynamics the patient was catheterized for 30 mL.  Maximum bladder capacity was 100 mL.  Bladder had sensory urgency.  Bladder was unstable reaching a pressure of 17 cm of water.  She had multiple unstable bladder contractions.  She would leak.  Pressure reached 25 cm of water and she had to void.  At 100 mL of Valsalva leak point pressure was 86 cm of water.  It was difficult to assess the Valsalva leak point pressure he has a small capacity instability.  Lindsay Austin thinks the following was a voluntary void but was not definitive.  She voided 38 mils a max flow 5 mils per second.  Maximum voiding pressure was 25 cm of water.  EMG activity  increased during the voiding phase.  Bladder neck descended 3 cm.  She was quite uncomfortable it with back and leg pain during the test.  She said she never feels in control of her bladder.  It did not appear that she had bladder pain during filling.  The details of the urodynamics are signed and dictated  She really did not have much of a cystocele fluoroscopically.     PMH: Past Medical History:  Diagnosis Date  . Anxiety   . Arthritis   . Bladder cancer ([place])   . Cancer of kidney ([place])   . DDD (degenerative disc disease), cervical   .  Depression   . Failed back syndrome   . Fibromyalgia   . GERD (gastroesophageal reflux disease)   . Heart murmur   . Hyperlipemia   . Hypertension   . IBS (irritable bowel syndrome)   . Kidney failure   . Renal insufficiency   . RSD (reflex sympathetic dystrophy)   . Seizure ([place])   . Stomach ulcer   . Trigeminal neuralgia     Surgical History: Past Surgical History:  Procedure Laterality Date  . NEPHROURETERECTOMY  2011  . TRANSURETHRAL RESECTION OF BLADDER TUMOR WITH GYRUS (TURBT-GYRUS)  2012, 2013    Home Medications:  Allergies as of 06/01/2018      Reactions   Methadone Palpitations   Other Swelling, Other (See Comments)   Tricyclic antidepressants Unable to void   Tape Other (See Comments)   blisters      Medication List       Accurate as of June 01, 2018  3:35 PM. Always use your most recent med list.        dicyclomine 20 MG tablet Commonly known as:  BENTYL Take 20 mg by mouth 3 (three) times daily.   escitalopram 10 MG tablet Commonly known as:  LEXAPRO TAKE 1 TABLET BY MOUTH EVERY DAY FOR MOOD   methocarbamol 750 MG tablet Commonly known as:  ROBAXIN Take 750 mg by mouth 2 (two) times daily as needed.   mirabegron ER 50 MG Tb24 tablet Commonly known as:  MYRBETRIQ Take 1 tablet (50 mg total) by mouth daily.   MS CONTIN 200 MG Tbcr Generic drug:  Morphine Sulfate ER Take 200 mg by mouth daily.   ondansetron 4 MG disintegrating tablet Commonly known as:  ZOFRAN-ODT Take 4 mg by mouth every 8 (eight) hours as needed. for nausea   oxybutynin 5 MG tablet Commonly known as:  DITROPAN Take 5 mg by mouth 3 (three) times daily.   SAVELLA 50 MG Tabs tablet Generic drug:  Milnacipran Take 50 mg by mouth 2 (two) times daily.   sucralfate 1 g tablet Commonly known as:  CARAFATE Take 1 g by mouth 2 (two) times daily.   traZODone 50 MG tablet Commonly known as:  DESYREL TAKE 2 TABLETS BY MOUTH NIGHTLY AT BEDTIME   WELLBUTRIN SR 150 MG 12  hr tablet Generic drug:  buPROPion Take 150 mg by mouth 2 (two) times daily.       Allergies:  Allergies  Allergen Reactions  . Methadone Palpitations  . Other Swelling and Other (See Comments)    Tricyclic antidepressants Unable to void   . Tape Other (See Comments)    blisters    Family History: Family History  Problem Relation Age of Onset  . Diabetes Mother   . Heart disease Mother   . Dementia Father   . Kidney disease Father  Social History:  reports that she has never smoked. She has never used smokeless tobacco. She reports that she does not drink alcohol or use drugs.  ROS: UROLOGY Frequent Urination?: Yes Hard to postpone urination?: Yes Burning/pain with urination?: No Get up at night to urinate?: Yes Leakage of urine?: Yes Urine stream starts and stops?: No Trouble starting stream?: No Do you have to strain to urinate?: No Blood in urine?: No Urinary tract infection?: No Sexually transmitted disease?: No Injury to kidneys or bladder?: No Painful intercourse?: No Weak stream?: No Currently pregnant?: No Vaginal bleeding?: No Last menstrual period?: n  Gastrointestinal Nausea?: Yes Vomiting?: No Indigestion/heartburn?: No Diarrhea?: No Constipation?: No  Constitutional Fever: No Night sweats?: No Weight loss?: No Fatigue?: No  Skin Skin rash/lesions?: No Itching?: No  Eyes Blurred vision?: No Double vision?: No  Ears/Nose/Throat Sore throat?: No Sinus problems?: No  Hematologic/Lymphatic Swollen glands?: No Easy bruising?: No  Cardiovascular Leg swelling?: No Chest pain?: No  Respiratory Cough?: No Shortness of breath?: No  Endocrine Excessive thirst?: No  Musculoskeletal Back pain?: Yes Joint pain?: Yes  Neurological Headaches?: Yes Dizziness?: Yes  Psychologic Depression?: Yes Anxiety?: Yes  Physical Exam: BP (!) 162/89 (BP Location: Left Arm, Patient Position: Sitting)   Pulse (!) 112   Ht 5\' 4"   (1.626 m)   Wt 163 lb (73.9 kg)   BMI 27.98 kg/m   Constitutional:  Alert and oriented, No acute distress. HEENT: [place] AT, moist mucus membranes.  Trachea midline, no masses. Cardiovascular: No clubbing, cyanosis, or edema. Respiratory: Normal respiratory effort, no increased work of breathing. GI: Abdomen is soft, nontender, nondistended, no abdominal masses GU: No CVA tenderness.  Skin: No rashes, bruises or suspicious lesions. Lymph: No cervical or inguinal adenopathy. Neurologic: Grossly intact, no focal deficits, moving all 4 extremities. Psychiatric: Normal mood and affect.  Laboratory Data: No results found for: WBC, HGB, HCT, MCV, PLT  Lab Results  Component Value Date   CREATININE 0.80 04/02/2018    No results found for: PSA  No results found for: TESTOSTERONE  No results found for: HGBA1C  Urinalysis    Component Value Date/Time   APPEARANCEUR Clear 04/06/2018 1313   GLUCOSEU Negative 04/06/2018 1313   BILIRUBINUR Negative 04/06/2018 1313   PROTEINUR Negative 04/06/2018 1313   NITRITE Negative 04/06/2018 1313   LEUKOCYTESUR Negative 04/06/2018 1313    Pertinent Imaging:   Assessment & Plan: I would like to treat the patient's mixed incontinence nonsurgically.  I would be exceptionally reluctant to perform a sling based upon her clinical presentation and urodynamic findings.  It does not appear that a cystocele repair is needed but I would want to repeat her examination if she was going to have a rectocele repair.  Whether or not she had a hysterectomy would depend again on the next examination.  Patient said that the incontinence and odor that bothers her the most.  We will try to help her nonsurgically.  We will proceed accordingly and may reevaluate her with the pelvic examination in the future pending goals of rectocele and prolapse.  Reassess in 6 weeks on Myrbetriq samples and prescription  There are no diagnoses linked to this encounter.  No follow-ups  on file.  Reece Packer, MD  [facility] [address] [place], [place] [address] [phone]

## 2018-06-08 ENCOUNTER — Telehealth: Payer: Self-pay | Admitting: Urology

## 2018-06-08 NOTE — Telephone Encounter (Signed)
Pt had severe back pain "from the Myrbetriq" also bladder retention, heart palpitations. She stopped meds and wants something else called in please.

## 2018-06-09 ENCOUNTER — Ambulatory Visit
Admission: RE | Admit: 2018-06-09 | Discharge: 2018-06-09 | Disposition: A | Discharge status: Home / Self Care | Payer: Medicaid Other | Source: Ambulatory Visit | Attending: Acute Care | Admitting: Acute Care

## 2018-06-09 ENCOUNTER — Ambulatory Visit: Admission: RE | Admit: 2018-06-09 | Payer: Medicaid Other | Source: Ambulatory Visit

## 2018-06-09 DIAGNOSIS — R202 Paresthesia of skin: Secondary | ICD-10-CM | POA: Diagnosis present

## 2018-06-09 DIAGNOSIS — G8929 Other chronic pain: Secondary | ICD-10-CM | POA: Diagnosis present

## 2018-06-09 DIAGNOSIS — R200 Anesthesia of skin: Secondary | ICD-10-CM | POA: Insufficient documentation

## 2018-06-09 DIAGNOSIS — M545 Low back pain: Secondary | ICD-10-CM | POA: Insufficient documentation

## 2018-06-09 NOTE — Telephone Encounter (Signed)
Any other medication you suggest? She is currently on Oxybutynin, Myrbetriq 25 did not help and she was having severe reactions to Myrbetriq 50mg 

## 2018-06-10 NOTE — Telephone Encounter (Signed)
detrol 4 mg po daily alone or with oxybutynin

## 2018-06-10 NOTE — Telephone Encounter (Signed)
LMOM for patient to return call.

## 2018-06-16 MED ORDER — TOLTERODINE TARTRATE ER 4 MG PO CP24: 4.0000 mg | ORAL_CAPSULE | Freq: Every day | ORAL | 11 refills | Status: DC

## 2018-06-16 NOTE — Telephone Encounter (Signed)
LMOM again and informed patient Detrol will be sent to her pharmacy to try.

## 2018-06-17 ENCOUNTER — Telehealth: Payer: Self-pay

## 2018-06-17 DIAGNOSIS — N3946 Mixed incontinence: Secondary | ICD-10-CM

## 2018-06-17 NOTE — Telephone Encounter (Signed)
TOviaz 8 mg

## 2018-06-17 NOTE — Telephone Encounter (Signed)
Patient's insurance does not cover Detrol LA. They will cover Toviaz or Vesicare ([facility] only) Please advise

## 2018-06-22 MED ORDER — FESOTERODINE FUMARATE ER 8 MG PO TB24: 8.0000 mg | ORAL_TABLET | Freq: Every day | ORAL | 11 refills | Status: DC

## 2018-06-22 NOTE — Telephone Encounter (Signed)
Called pt informed her of the need to change medication due to insurance. Pt gave verbal understanding. New RX sent in.

## 2018-06-23 ENCOUNTER — Encounter: Payer: Medicaid Other | Attending: Psychology | Admitting: Psychology

## 2018-06-23 DIAGNOSIS — Z9889 Other specified postprocedural states: Secondary | ICD-10-CM

## 2018-06-23 DIAGNOSIS — G894 Chronic pain syndrome: Secondary | ICD-10-CM

## 2018-06-23 DIAGNOSIS — F331 Major depressive disorder, recurrent, moderate: Secondary | ICD-10-CM | POA: Diagnosis not present

## 2018-06-23 DIAGNOSIS — T85192S Other mechanical complication of implanted electronic neurostimulator (electrode) of spinal cord, sequela: Secondary | ICD-10-CM

## 2018-06-23 DIAGNOSIS — G9050 Complex regional pain syndrome I, unspecified: Secondary | ICD-10-CM

## 2018-06-23 DIAGNOSIS — Z981 Arthrodesis status: Secondary | ICD-10-CM

## 2018-06-23 DIAGNOSIS — Q761 Klippel-Feil syndrome: Secondary | ICD-10-CM
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
## 2018-06-29 ENCOUNTER — Other Ambulatory Visit: Payer: Self-pay

## 2018-06-29 ENCOUNTER — Ambulatory Visit (HOSPITAL_BASED_OUTPATIENT_CLINIC_OR_DEPARTMENT_OTHER): Payer: Medicaid Other | Admitting: Nurse Practitioner

## 2018-06-29 ENCOUNTER — Encounter: Payer: Self-pay | Admitting: Nurse Practitioner

## 2018-06-29 VITALS — BP 166/86 | HR 75 | Temp 98.7°F | Resp 18 | Ht 64.0 in | Wt 160.0 lb

## 2018-06-29 DIAGNOSIS — G894 Chronic pain syndrome: Secondary | ICD-10-CM

## 2018-06-29 NOTE — Progress Notes (Signed)
Safety precautions to be maintained throughout the outpatient stay will include: orient to surroundings, keep bed in low position, maintain call bell within reach at all times, provide assistance with transfer out of bed and ambulation.  

## 2018-07-13 ENCOUNTER — Ambulatory Visit: Payer: Medicaid Other | Admitting: Urology

## 2018-07-13 ENCOUNTER — Encounter: Payer: Self-pay | Admitting: Urology

## 2018-07-13 ENCOUNTER — Other Ambulatory Visit: Payer: Self-pay

## 2018-07-13 VITALS — BP 127/77 | HR 112 | Ht 64.0 in | Wt 163.0 lb

## 2018-07-13 DIAGNOSIS — N3946 Mixed incontinence: Secondary | ICD-10-CM | POA: Diagnosis not present

## 2018-07-13 MED ORDER — SOLIFENACIN SUCCINATE 5 MG PO TABS: 5.0000 mg | ORAL_TABLET | Freq: Every day | ORAL | 11 refills | Status: DC

## 2018-07-13 NOTE — Progress Notes (Signed)
07/13/2018 3:28 PM   Lindsay Austin 03-Sep-1960 [id number]  Referring provider: [name], [facility] [place], [place] [address]  Chief Complaint  Patient presents with  . Urinary Incontinence    6wk    HPI: Dr [id number] has been followed for urothelial carcinoma and has had a left nephroureterectomy and BCG dating back 2013. She is on Myrbetriq for incontinence and is known to have prolapse as noted  The patient leaks with coughing sneezing and bending but not every time. She has urge incontinence. She has no bedwetting. 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 Both components are significant especially during coughing season. She voids 2 or 3 times a night and voids every 2 hours during the day. She has had 2 recent bladder infections. She has not had previous bladder surgery.  She describes feeling fullness in the vagina but was nonspecific. She did report pressing her hand in the vagina and even digitally to pass reasonably normal bowel movements. She thinks there is a shelf. She tries to avoid sitting but I felt it was more from her back and not from vaginal symptoms.  On pelvic examination at rest she had a small grade 1 cystocele. When she would bear down she would have rotational descent of the anterior vaginal wall with a grade 2 cystocele that was smaller. Her uterus descended from 8 or 9 cm to approximately 6 cm. She had a small distal grade 2 rectocele. She leaked a few drops with coughing.  Cystoscopy: normal  The patient has mixed incontinence and no question an overactive bladder. She is frequency nocturia. She has mild prolapse symptoms but does have rectocele symptoms. The role of urodynamics discussed. We will also look at the size of the cystocele during urodynamics. A picture was drawn. If she ever had surgery she would likely best benefit from a transvaginal hysterectomy with possible vault suspension cystocele repair  and graft and rectocele repair. If the uterus was left in situ it may be difficult to actually perform a cystocele because at rest she has very little cystocele defects. Depending on goals her incontinence likely could be managed nonsurgically.  Likely she had a hysterectomy with fixation of the cuff to the ureteral sacral ligament she would probably not need a vault suspension. I again with the uterus removed she may not need a cystocele repair. Based upon today's examination one could do the rectocele and not the cystocele if the findings were sent to during surgery. She has more of off elasticity issue with descensus than a central defect.   During urodynamics the patient was catheterized for 30 mL.  Maximum bladder capacity was 100 mL.  Bladder had sensory urgency.  Bladder was unstable reaching a pressure of 17 cm of water.  She had multiple unstable bladder contractions.  She would leak.  Pressure reached 25 cm of water and she had to void.  At 100 mL of Valsalva leak point pressure was 86 cm of water.  It was difficult to assess the Valsalva leak point pressure he has a small capacity instability.  [name] thinks the following was a voluntary void but was not definitive.  She voided 38 mils a max flow 5 mils per second.  Maximum voiding pressure was 25 cm of water.  EMG activity increased during the voiding phase.  Bladder neck descended 3 cm.  She was quite uncomfortable it with back and leg pain during the test.  She said she never feels in control  of her bladder.  It did not appear that she had bladder pain during filling.  She really did not have much of a cystocele fluoroscopically.   I would like to treat the patient's mixed incontinence nonsurgically.  I would be exceptionally reluctant to perform a sling based upon her clinical presentation and urodynamic findings.  It does not appear that a cystocele repair is needed but I would want to repeat her examination if she was going to have a  rectocele repair.  Whether or not she had a hysterectomy would depend again on the next examination.  Patient said that the incontinence and odor that bothers her the most.  We will try to help her nonsurgically.  We will proceed accordingly and may reevaluate her with the pelvic examination in the future pending goals of rectocele and prolapse.  Reassess in 6 weeks on Myrbetriq samples and prescription  Today Myrbetriq caused retention and potentially elevated blood pressure.  Toviaz her insurance would cover but it did not help.  Apparently insurance does not cover Detrol.  She is still bothered by the pressure in the vagina and the fact that she has to do some splinting for bowel movements.  She will has urgency and frequency  Reevaluate in 6 weeks on Vesicare's prescription since insurance covers this.  Reexamine patient and discuss treatment goals and potential for surgery as noted prior    PMH: Past Medical History:  Diagnosis Date  . Anxiety   . Arthritis   . Bladder cancer ([facility])   . Cancer of kidney ([facility])   . DDD (degenerative disc disease), cervical   . Depression   . Failed back syndrome   . Fibromyalgia   . GERD (gastroesophageal reflux disease)   . Heart murmur   . Hyperlipemia   . Hypertension   . IBS (irritable bowel syndrome)   . Kidney failure   . Renal insufficiency   . RSD (reflex sympathetic dystrophy)   . Seizure ([facility])   . Stomach ulcer   . Trigeminal neuralgia     Surgical History: Past Surgical History:  Procedure Laterality Date  . NEPHROURETERECTOMY  2011  . TRANSURETHRAL RESECTION OF BLADDER TUMOR WITH GYRUS (TURBT-GYRUS)  2012, 2013    Home Medications:  Allergies as of 07/13/2018      Reactions   Methadone Palpitations   Other Swelling, Other (See Comments)   Tricyclic antidepressants Unable to void   Tape Other (See Comments)   blisters      Medication List       Accurate as of July 13, 2018  3:28 PM. Always use your most recent med  list.        dicyclomine 20 MG tablet Commonly known as:  BENTYL Take 20 mg by mouth 3 (three) times daily.   escitalopram 10 MG tablet Commonly known as:  LEXAPRO TAKE 1 TABLET BY MOUTH EVERY DAY FOR MOOD   fesoterodine 8 MG Tb24 tablet Commonly known as:  TOVIAZ Take 1 tablet (8 mg total) by mouth daily.   meclizine 25 MG tablet Commonly known as:  ANTIVERT Take 25 mg by mouth 3 (three) times daily as needed for dizziness.   methocarbamol 750 MG tablet Commonly known as:  ROBAXIN Take 750 mg by mouth 2 (two) times daily as needed.   MS Contin 200 MG Tbcr Generic drug:  Morphine Sulfate ER Take 200 mg by mouth daily.   ondansetron 4 MG disintegrating tablet Commonly known as:  ZOFRAN-ODT Take 4 mg by  mouth every 8 (eight) hours as needed. for nausea   pregabalin 50 MG capsule Commonly known as:  LYRICA Take 50 mg by mouth 3 (three) times daily.   sucralfate 1 g tablet Commonly known as:  CARAFATE Take 1 g by mouth 2 (two) times daily.   topiramate 50 MG tablet Commonly known as:  TOPAMAX TAKE 1/2 TABLET EVERY DAY FOR 1 WEEK, THEN TAKE 1 TABLET EVERY DAY   traZODone 50 MG tablet Commonly known as:  DESYREL TAKE 2 TABLETS BY MOUTH NIGHTLY AT BEDTIME   Wellbutrin SR 150 MG 12 hr tablet Generic drug:  buPROPion Take 150 mg by mouth 2 (two) times daily.       Allergies:  Allergies  Allergen Reactions  . Methadone Palpitations  . Other Swelling and Other (See Comments)    Tricyclic antidepressants Unable to void   . Tape Other (See Comments)    blisters    Family History: Family History  Problem Relation Age of Onset  . Diabetes Mother   . Heart disease Mother   . Dementia Father   . Kidney disease Father     Social History:  reports that she has never smoked. She has never used smokeless tobacco. She reports that she does not drink alcohol or use drugs.  ROS: UROLOGY Frequent Urination?: Yes Hard to postpone urination?: Yes Burning/pain  with urination?: No Get up at night to urinate?: Yes Leakage of urine?: Yes Urine stream starts and stops?: No Trouble starting stream?: No Do you have to strain to urinate?: No Blood in urine?: No Urinary tract infection?: No Sexually transmitted disease?: No Injury to kidneys or bladder?: No Painful intercourse?: No Weak stream?: No Currently pregnant?: No Vaginal bleeding?: No Last menstrual period?: n  Gastrointestinal Nausea?: Yes Vomiting?: No Indigestion/heartburn?: No Diarrhea?: No Constipation?: Yes  Constitutional Fever: No Night sweats?: No Weight loss?: No Fatigue?: Yes  Skin Skin rash/lesions?: No Itching?: No  Eyes Blurred vision?: No Double vision?: No  Ears/Nose/Throat Sore throat?: No Sinus problems?: Yes  Hematologic/Lymphatic Swollen glands?: No Easy bruising?: Yes  Cardiovascular Leg swelling?: No Chest pain?: No  Respiratory Cough?: No Shortness of breath?: No  Endocrine Excessive thirst?: No  Musculoskeletal Back pain?: Yes Joint pain?: Yes  Neurological Headaches?: Yes Dizziness?: Yes  Psychologic Depression?: Yes Anxiety?: Yes  Physical Exam: BP 127/77   Pulse (!) 112   Ht 5\' 4"  (1.626 m)   Wt 163 lb (73.9 kg)   BMI 27.98 kg/m   Constitutional:  Alert and oriented, No acute distress.   Laboratory Data: No results found for: WBC, HGB, HCT, MCV, PLT  Lab Results  Component Value Date   CREATININE 0.80 04/02/2018    No results found for: PSA  No results found for: TESTOSTERONE  No results found for: HGBA1C  Urinalysis    Component Value Date/Time   APPEARANCEUR Clear 04/06/2018 1313   GLUCOSEU Negative 04/06/2018 1313   BILIRUBINUR Negative 04/06/2018 1313   PROTEINUR Negative 04/06/2018 1313   NITRITE Negative 04/06/2018 1313   LEUKOCYTESUR Negative 04/06/2018 1313    Pertinent Imaging:   Assessment & Plan: Follow-up on Vesicare in 7 weeks  There are no diagnoses linked to this encounter.   No follow-ups on file.  Reece Packer, MD  [facility] [address], [place],  [address] [phone]

## 2018-07-20 ENCOUNTER — Telehealth: Payer: Self-pay | Admitting: Urology

## 2018-07-20 NOTE — Telephone Encounter (Signed)
Pt is having neck pain and headache, low back pain and abdominal pain. Pt has stopped Vesicare, she wanted to let you know.

## 2018-07-21 NOTE — Telephone Encounter (Signed)
Spoke to patient and she states she is not going to take the Vesicare because it is causing headache, abdominal pain, back pain. She will call us if she needs [place].

## 2018-07-23 ENCOUNTER — Other Ambulatory Visit: Payer: Self-pay

## 2018-07-23 ENCOUNTER — Encounter: Payer: Medicaid Other | Admitting: Student in an Organized Health Care Education/Training Program

## 2018-07-23 ENCOUNTER — Encounter: Payer: Self-pay | Admitting: Student in an Organized Health Care Education/Training Program

## 2018-07-23 ENCOUNTER — Ambulatory Visit
Payer: Medicaid Other | Attending: Nurse Practitioner | Admitting: Student in an Organized Health Care Education/Training Program

## 2018-07-23 VITALS — BP 148/90 | HR 110 | Temp 98.4°F | Resp 16 | Ht 64.0 in | Wt 163.0 lb

## 2018-07-23 DIAGNOSIS — M47816 Spondylosis without myelopathy or radiculopathy, lumbar region: Secondary | ICD-10-CM | POA: Insufficient documentation

## 2018-07-23 DIAGNOSIS — Z981 Arthrodesis status: Secondary | ICD-10-CM | POA: Diagnosis not present

## 2018-07-23 DIAGNOSIS — T85192S Other mechanical complication of implanted electronic neurostimulator (electrode) of spinal cord, sequela: Secondary | ICD-10-CM | POA: Diagnosis not present

## 2018-07-23 DIAGNOSIS — F329 Major depressive disorder, single episode, unspecified: Secondary | ICD-10-CM | POA: Diagnosis present

## 2018-07-23 DIAGNOSIS — Q761 Klippel-Feil syndrome: Secondary | ICD-10-CM | POA: Diagnosis present

## 2018-07-23 DIAGNOSIS — G9050 Complex regional pain syndrome I, unspecified: Secondary | ICD-10-CM

## 2018-07-23 DIAGNOSIS — Z9889 Other specified postprocedural states: Secondary | ICD-10-CM

## 2018-07-23 DIAGNOSIS — G894 Chronic pain syndrome: Secondary | ICD-10-CM | POA: Diagnosis present

## 2018-07-23 MED ORDER — MORPHINE SULFATE ER 60 MG PO TBCR: 60.0000 mg | EXTENDED_RELEASE_TABLET | Freq: Two times a day (BID) | ORAL | 0 refills | Status: DC

## 2018-07-23 NOTE — Patient Instructions (Addendum)
Sign contractPreparing for Procedure with Sedation Instructions: . Oral Intake: Do not eat or drink anything for at least 8 hours prior to your procedure. . Transportation: Public transportation is not allowed. Bring an adult driver. The driver must be physically present in our waiting room before any procedure can be started. Marland Kitchen Physical Assistance: Bring an adult capable of physically assisting you, in the event you need help. . Blood Pressure Medicine: Take your blood pressure medicine with a sip of water the morning of the procedure. . Insulin: Take only  of your normal insulin dose. . Preventing infections: Shower with an antibacterial soap the morning of your procedure. . Build-up your immune system: Take 1000 mg of Vitamin C with every meal (3 times a day) the day prior to your procedure. . Pregnancy: If you are pregnant, call and cancel the procedure. . Sickness: If you have a cold, fever, or any active infections, call and cancel the procedure. . Arrival: You must be in the facility at least 30 minutes prior to your scheduled procedure. . Children: Do not bring children with you. . Dress appropriately: Bring dark clothing that you would not mind if they get stained. . Valuables: Do not bring any jewelry or valuables. Procedure appointments are reserved for interventional treatments only. Marland Kitchen No Prescription Refills. . No medication changes will be discussed during procedure appointments. No disability issues will be discussed.

## 2018-07-23 NOTE — Progress Notes (Signed)
Patient's Name: Lindsay Austin  MRN: [id number]  Referring Provider: [name], [place]  DOB: March 10, 1961  PCP: [name], [place]  DOS: 07/23/2018  Note by: [name], MD  Service setting: Ambulatory outpatient  Specialty: Interventional Pain Management  Location: [facility] (AMB) Pain Management Facility    Patient type: Established   Primary Reason(s) for Visit: Encounter for evaluation before starting new chronic pain management plan of care (Level of risk: moderate) CC: Pain ("nerve pain")  HPI  Lindsay Austin is a 58 y.o. year old, female patient, who comes today for a follow-up evaluation to review the test results and decide on a treatment plan. She has Hx of lumbosacral spine surgery; History of lumbar fusion (L5-S1 fusion); Failed spinal cord stimulator, sequela; Cervical fusion syndrome (C4-C7); RSD (reflex sympathetic dystrophy); Major depressive disorder with current active episode; and Lumbar facet arthropathy on their problem list. Her primarily concern today is the Pain ("nerve pain")  Pain Assessment: Location:   ("nerve pain") Radiating:   Onset: More than a month ago Duration: Chronic pain Quality: Burning, Tingling, Crushing, Shooting Severity: 3 /10 (subjective, self-reported pain score)  Note: Reported level is compatible with observation.                         When using our objective Pain Scale, levels between 6 and 10/10 are said to belong in an emergency room, as it progressively worsens from a 6/10, described as severely limiting, requiring emergency care not usually available at an outpatient pain management facility. At a 6/10 level, communication becomes difficult and requires great effort. Assistance to reach the emergency department may be required. Facial flushing and profuse sweating along with potentially dangerous increases in heart rate and blood pressure will be evident. Effect on ADL:   Timing: Constant Modifying factors: lying down with feet elevated BP: (!) 148/90   HR: (!) 110  Lindsay Austin comes in today for a follow-up visit after her initial evaluation on Visit date not found. Today we went over the results of her tests. These were explained in "Layman's terms". During today's appointment we went over my diagnostic impression, as well as the proposed treatment plan.  Patient follows up today for medication management.  She has been evaluated by psychologist.  Low risk for substance abuse misuse, no red flags obtained on psych evaluation.  Patient states that she has been taking morphine 200 mg every other day.  Please refer to my detailed clinic note in September 2019 regarding treatment plan.  I again reinforced to the patient that we would not exceed a total daily dose of 120 mg of morphine.  This equates to 120 MME.  We can do 60 mg twice daily dosing.  In considering the treatment plan options, Lindsay Austin was reminded that I no longer take patients for medication management only. I asked her to let me know if she had no intention of taking advantage of the interventional therapies, so that we could make arrangements to provide this space to someone interested. I also made it clear that undergoing interventional therapies for the purpose of getting pain medications is very inappropriate on the part of a patient, and it will not be tolerated in this practice. This type of behavior would suggest true addiction and therefore it requires referral to an addiction specialist.   Further details on both, my assessment(s), as well as the proposed treatment plan, please see below.  Controlled Substance Pharmacotherapy Assessment REMS (Risk Evaluation  and Mitigation Strategy)  Analgesic: Morphine 60 mg twice daily, quantity 60/month Pill Count: None expected due to no prior prescriptions written by our practice. Lindsay Martins, RN  07/23/2018 10:51 AM  Sign when Signing Visit Safety precautions to be maintained throughout the outpatient stay will include: orient to  surroundings, keep bed in low position, maintain call bell within reach at all times, provide assistance with transfer out of bed and ambulation.    Pharmacokinetics: Liberation and absorption (onset of action): WNL Distribution (time to peak effect): WNL Metabolism and excretion (duration of action): WNL         Pharmacodynamics: Desired effects: Analgesia: Lindsay Austin reports 50% benefit. Functional ability: Patient reports that medication allows her to accomplish basic ADLs Clinically meaningful improvement in function (CMIF): Sustained CMIF goals met Perceived effectiveness: Described as relatively effective, allowing for increase in activities of daily living (ADL) Undesirable effects: Side-effects or Adverse reactions: None reported Monitoring: [place] PMP: Online review of the past 77-monthperiod previously conducted. Not applicable at this point since we have not taken over the patient's medication management yet. List of other Serum/Urine Drug Screening Test(s):  No results found for: AMPHSCRSER, BARBSCRSER, BENZOSCRSER, COCAINSCRSER, COCAINSCRNUR, PCPSCRSER, THCSCRSER, THCU, CANNABQUANT, [place] PInterior EArispeList of all UDS test(s) done:  Lab Results  Component Value Date   SUMMARY FINAL 01/15/2018   Last UDS on record: Summary  Date Value Ref Range Status  01/15/2018 FINAL  Final    Comment:    ==================================================================== TOXASSURE COMP DRUG ANALYSIS,UR ==================================================================== Test                             Result       Flag       Units Drug Present and Declared for Prescription Verification   Morphine                       >13514       EXPECTED   ng/mg creat   Normorphine                    2788         EXPECTED   ng/mg creat    Potential sources of large amounts of morphine in the absence of    codeine include administration of morphine or use of heroin.     Normorphine is an expected metabolite of morphine.   Hydromorphone                  699          EXPECTED   ng/mg creat    Hydromorphone may be present as a metabolite of morphine;    concentrations of hydromorphone rarely exceed 5% of the morphine    concentration when this is the source of hydromorphone.   Citalopram                     PRESENT      EXPECTED   Desmethylcitalopram            PRESENT      EXPECTED    Desmethylcitalopram is an expected metabolite of citalopram or    the enantiomeric form, escitalopram.   Trazodone                      PRESENT      EXPECTED   1,3 chlorophenyl piperazine  PRESENT      EXPECTED    1,3-chlorophenyl piperazine is an expected metabolite of    trazodone. Drug Absent but Declared for Prescription Verification   Methocarbamol                  Not Detected UNEXPECTED   Bupropion                      Not Detected UNEXPECTED   Milnacipran                    Not Detected UNEXPECTED ==================================================================== Test                      Result    Flag   Units      Ref Range   Creatinine              74               mg/dL      >=20 ==================================================================== Declared Medications:  The flagging and interpretation on this report are based on the  following declared medications.  Unexpected results may arise from  inaccuracies in the declared medications.  **Note: The testing scope of this panel includes these medications:  Bupropion  Escitalopram  Methocarbamol  Milnacipran  Morphine (Morphine Sulfate)  Trazodone  **Note: The testing scope of this panel does not include following  reported medications:  Dicyclomine  Mirabegron  Ondansetron  Oxybutynin  Sucralfate ==================================================================== For clinical consultation, please call [phone]. ====================================================================     UDS interpretation: No unexpected findings.          Medication Assessment Form: Patient introduced to form today Treatment compliance: Treatment may start today if patient agrees with proposed plan. Evaluation of compliance is not applicable at this point Risk Assessment Profile: Aberrant behavior: See initial evaluations. None observed or detected today Comorbid factors increasing risk of overdose: See initial evaluation. No additional risks detected today Opioid risk tool (ORT):  Opioid Risk  06/29/2018  Alcohol 0  Illegal Drugs 0  Rx Drugs 0  Alcohol 0  Illegal Drugs 0  Rx Drugs 0  Age between 16-45 years  0  History of Preadolescent Sexual Abuse 0  Psychological Disease 0  Depression 1  Opioid Risk Tool Scoring 1  Opioid Risk Interpretation Low Risk    ORT Scoring interpretation table:  Score <3 = Low Risk for SUD  Score between 4-7 = Moderate Risk for SUD  Score >8 = High Risk for Opioid Abuse   Risk of substance use disorder (SUD): Low  Risk Mitigation Strategies:  Patient opioid safety counseling: Completed today. Counseling provided to patient as per "Patient Counseling Document". Document signed by patient, attesting to counseling and understanding Patient-Prescriber Agreement (PPA): Obtained today.  Controlled substance notification to other providers: Written and sent today.  Pharmacologic Plan: Today we may be taking over the patient's pharmacological regimen. See below.             Laboratory Chemistry  Inflammation Markers (CRP: Acute Phase) (ESR: Chronic Phase) No results found for: CRP, ESRSEDRATE, LATICACIDVEN                       Rheumatology Markers No results found for: RF, ANA, LABURIC, URICUR, LYMEIGGIGMAB, LYMEABIGMQN, HLAB27                      Renal Function Markers Lab Results  Component Value Date   CREATININE 0.80 04/02/2018                             Recent Diagnostic Imaging Review   Lumbosacral Imaging: Lumbar MR wo contrast:   Results for orders placed during the hospital encounter of 06/09/18  MR LUMBAR SPINE WO CONTRAST   Narrative CLINICAL DATA:  Low back pain.  Multiple prior surgeries x7.  EXAM: MRI LUMBAR SPINE WITHOUT CONTRAST  TECHNIQUE: Multiplanar, multisequence MR imaging of the lumbar spine was performed. No intravenous contrast was administered.  COMPARISON:  None.  FINDINGS: Segmentation:  Standard.  Alignment:  Physiologic.  Vertebrae: No fracture, evidence of discitis, or aggressive bone lesion. T1 and T2 hyperintense L3 vertebral body bone lesion most consistent with a hemangioma.  Conus medullaris and cauda equina: Conus extends to the T12 level. Conus and cauda equina appear normal.  Paraspinal and other soft tissues: No acute paraspinal abnormality. Prior left nephrectomy.  Disc levels:  Disc spaces: Interbody fusion at L5-S1.  T12-L1: No significant disc bulge. No evidence of neural foraminal stenosis. No central canal stenosis.  L1-L2: No significant disc bulge. No evidence of neural foraminal stenosis. No central canal stenosis. Mild bilateral facet arthropathy.  L2-L3: No significant disc bulge. No evidence of neural foraminal stenosis. No central canal stenosis.  L3-L4: Mild broad-based disc bulge. No evidence of neural foraminal stenosis. No central canal stenosis.  L4-L5: Broad-based disc bulge. Severe bilateral facet arthropathy with ligamentum flavum infolding. Moderate spinal stenosis. Right lateral recess stenosis. No foraminal stenosis.  L5-S1: Interbody fusion. No evidence of neural foraminal stenosis. No central canal stenosis.  IMPRESSION: 1. At L4-5 there is a broad-based disc bulge. Severe bilateral facet arthropathy with ligamentum flavum infolding. Moderate spinal stenosis. Right lateral recess stenosis.   Electronically Signed   By: Kathreen Devoid   On: 06/09/2018 14:49      Complexity Note: Imaging results reviewed. Results shared  with Ms. Mcdougald, using Layman's terms.                         Meds   Current Outpatient Medications:  .  buPROPion (WELLBUTRIN SR) 150 MG 12 hr tablet, Take 150 mg by mouth 2 (two) times daily., Disp: , Rfl:  .  dicyclomine (BENTYL) 20 MG tablet, Take 20 mg by mouth 3 (three) times daily., Disp: , Rfl:  .  escitalopram (LEXAPRO) 10 MG tablet, TAKE 1 TABLET BY MOUTH EVERY DAY FOR MOOD, Disp: , Rfl: 2 .  meclizine (ANTIVERT) 25 MG tablet, Take 25 mg by mouth 3 (three) times daily as needed for dizziness., Disp: , Rfl:  .  methocarbamol (ROBAXIN) 750 MG tablet, Take 750 mg by mouth 2 (two) times daily as needed., Disp: , Rfl: 2 .  ondansetron (ZOFRAN-ODT) 4 MG disintegrating tablet, Take 4 mg by mouth every 8 (eight) hours as needed. for nausea, Disp: , Rfl: 2 .  pregabalin (LYRICA) 50 MG capsule, Take 50 mg by mouth 3 (three) times daily., Disp: , Rfl:  .  sucralfate (CARAFATE) 1 g tablet, Take 1 g by mouth 2 (two) times daily., Disp: , Rfl:  .  traZODone (DESYREL) 50 MG tablet, TAKE 2 TABLETS BY MOUTH NIGHTLY AT BEDTIME, Disp: , Rfl: 2 .  morphine (MS CONTIN) 60 MG 12 hr tablet, Take 1 tablet (60 mg total) by mouth every 12 (twelve) hours for 30 days. Must last 30  days., Disp: 60 tablet, Rfl: 0 .  topiramate (TOPAMAX) 50 MG tablet, TAKE 1/2 TABLET EVERY DAY FOR 1 WEEK, THEN TAKE 1 TABLET EVERY DAY, Disp: , Rfl:   ROS  Constitutional: Denies any fever or chills Gastrointestinal: No reported hemesis, hematochezia, vomiting, or acute GI distress Musculoskeletal: Denies any acute onset joint swelling, redness, loss of ROM, or weakness Neurological: No reported episodes of acute onset apraxia, aphasia, dysarthria, agnosia, amnesia, paralysis, loss of coordination, or loss of consciousness  Allergies  Ms. Ruder is allergic to methadone; other; and tape.  [place]  Drug: Ms. Lamica  reports no history of drug use. Alcohol:  reports no history of alcohol use. Tobacco:  reports that she has never smoked.  She has never used smokeless tobacco. Medical:  has a past medical history of Anxiety, Arthritis, Bladder cancer ([place]), Cancer of kidney ([place]), DDD (degenerative disc disease), cervical, Depression, Failed back syndrome, Fibromyalgia, GERD (gastroesophageal reflux disease), Heart murmur, Hyperlipemia, Hypertension, IBS (irritable bowel syndrome), Kidney failure, Renal insufficiency, RSD (reflex sympathetic dystrophy), Seizure ([place]), Stomach ulcer, and Trigeminal neuralgia. Surgical: Ms. Mcdanel  has a past surgical history that includes Transurethral resection of bladder tumor with gyrus (turbt-gyrus) (2012, 2013) and Nephroureterectomy (2011). Family: family history includes Dementia in her father; Diabetes in her mother; Heart disease in her mother; Kidney disease in her father.  Constitutional Exam  General appearance: Well nourished, well developed, and well hydrated. In no apparent acute distress Vitals:   07/23/18 1050  BP: (!) 148/90  Pulse: (!) 110  Resp: 16  Temp: 98.4 F (36.9 C)  TempSrc: Oral  SpO2: 96%  Weight: 163 lb (73.9 kg)  Height: 5' 4" (1.626 m)   BMI Assessment: Estimated body mass index is 27.98 kg/m as calculated from the following:   Height as of this encounter: 5' 4" (1.626 m).   Weight as of this encounter: 163 lb (73.9 kg).  BMI interpretation table: BMI level Category Range association with higher incidence of chronic pain  <18 kg/m2 Underweight   18.5-24.9 kg/m2 Ideal body weight   25-29.9 kg/m2 Overweight Increased incidence by 20%  30-34.9 kg/m2 Obese (Class I) Increased incidence by 68%  35-39.9 kg/m2 Severe obesity (Class II) Increased incidence by 136%  >40 kg/m2 Extreme obesity (Class III) Increased incidence by 254%   Patient's current BMI Ideal Body weight  Body mass index is 27.98 kg/m. Ideal body weight: 54.7 kg (120 lb 9.5 oz) Adjusted ideal body weight: 62.4 kg (137 lb 8.9 oz)   BMI Readings from Last 4 Encounters:  07/23/18 27.98 kg/m   07/13/18 27.98 kg/m  06/29/18 27.46 kg/m  06/01/18 27.98 kg/m   Wt Readings from Last 4 Encounters:  07/23/18 163 lb (73.9 kg)  07/13/18 163 lb (73.9 kg)  06/29/18 160 lb (72.6 kg)  06/01/18 163 lb (73.9 kg)  Psych/Mental status: Alert, oriented x 3 (person, place, & time)       Eyes: PERLA Respiratory: No evidence of acute respiratory distress   Cervical Spine Area Exam  Skin & Axial Inspection: Well healed scar from previous spine surgery detected Alignment: Symmetrical Functional ROM: Decreased ROM      Stability: No instability detected Muscle Tone/Strength: Functionally intact. No obvious neuro-muscular anomalies detected. Sensory (Neurological): Musculoskeletal pain pattern and neuropathic Palpation: No palpable anomalies             4 out of 5 strength bilateral upper extremity: Shoulder abduction, elbow flexion, elbow extension, thumb extension.  Upper Extremity (UE) Exam    Side: Right upper extremity  Side: Left upper extremity   Skin & Extremity Inspection: Skin color, temperature, and hair growth are WNL. No peripheral edema or cyanosis. No masses, redness, swelling, asymmetry, or associated skin lesions. No contractures.  Skin & Extremity Inspection: Skin color, temperature, and hair growth are WNL. No peripheral edema or cyanosis. No masses, redness, swelling, asymmetry, or associated skin lesions. No contractures.   Functional ROM: Decreased ROM for shoulder and elbow  Functional ROM: Decreased ROM for shoulder   Muscle Tone/Strength: Functionally intact. No obvious neuro-muscular anomalies detected.  Muscle Tone/Strength: Functionally intact. No obvious neuro-muscular anomalies detected.   Sensory (Neurological): Neuropathic pain pattern          Sensory (Neurological): Neuropathic pain pattern           Palpation: No palpable anomalies              Palpation: No palpable anomalies               Provocative Test(s):  Phalen's test: deferred Tinel's  test: deferred Apley's scratch test (touch opposite shoulder):  Action 1 (Across chest): deferred Action 2 (Overhead): deferred Action 3 (LB reach): deferred   Provocative Test(s):  Phalen's test: deferred Tinel's test: deferred Apley's scratch test (touch opposite shoulder):  Action 1 (Across chest): deferred Action 2 (Overhead): deferred Action 3 (LB reach): deferred     Thoracic Spine Area Exam  Skin & Axial Inspection: Well healed scar from previous spine surgery detected Alignment: Symmetrical Functional ROM: Decreased ROM Stability: No instability detected Muscle Tone/Strength: Functionally intact. No obvious neuro-muscular anomalies detected. Sensory (Neurological): Musculoskeletal pain pattern Muscle strength & Tone: No palpable anomalies  Lumbar Spine Area Exam  Skin & Axial Inspection: Well healed scar from previous spine surgery detected Alignment: Scoliosis detected Functional ROM: Decreased ROM       Stability: No instability detected Muscle Tone/Strength: Functionally intact. No obvious neuro-muscular anomalies detected. Sensory (Neurological): Musculoskeletal pain pattern and neuropathic Palpation: Complains of area being tender to palpation       Provocative Tests: Hyperextension/rotation test: deferred today       Lumbar quadrant test (Kemp's test): deferred today       Lateral bending test: deferred today       Patrick's Maneuver: deferred today                   FABER test: deferred today                   S-I anterior distraction/compression test: deferred today         S-I lateral compression test: deferred today         S-I Thigh-thrust test: deferred today         S-I Gaenslen's test: deferred today          Gait & Posture Assessment  Ambulation: Patient ambulates using a wheel chair Gait: Significantly limited. Dependent on assistive device to ambulate Posture: Difficulty with positional changes   Lower Extremity Exam    Side: Right  lower extremity  Side: Left lower extremity  Stability: No instability observed          Stability: No instability observed          Skin & Extremity Inspection: Skin color, temperature, and hair growth are WNL. No peripheral edema or cyanosis. No masses, redness, swelling, asymmetry, or associated skin lesions. No contractures.  Skin & Extremity Inspection: Skin color,  temperature, and hair growth are WNL. No peripheral edema or cyanosis. No masses, redness, swelling, asymmetry, or associated skin lesions. No contractures.  Functional ROM: Decreased ROM for all joints of the lower extremity          Functional ROM: Decreased ROM for all joints of the lower extremity          Muscle Tone/Strength: Functionally intact. No obvious neuro-muscular anomalies detected.  Muscle Tone/Strength: Functionally intact. No obvious neuro-muscular anomalies detected.  Sensory (Neurological): Neuropathic pain pattern  Sensory (Neurological): Neuropathic pain pattern  Palpation: No palpable anomalies  Palpation: No palpable anomalies   Assessment & Plan  Primary Diagnosis & Pertinent Problem List: The primary encounter diagnosis was Chronic pain syndrome. Diagnoses of Hx of lumbosacral spine surgery, History of lumbar fusion (L5-S1 fusion), Failed spinal cord stimulator, sequela, Cervical fusion syndrome (C4-C7), RSD (reflex sympathetic dystrophy) (bilateral hands and feet), Major depressive disorder with current active episode, unspecified depression episode severity, unspecified whether recurrent, and Lumbar facet arthropathy were also pertinent to this visit.  Visit Diagnosis: 1. Chronic pain syndrome   2. Hx of lumbosacral spine surgery   3. History of lumbar fusion (L5-S1 fusion)   4. Failed spinal cord stimulator, sequela   5. Cervical fusion syndrome (C4-C7)   6. RSD (reflex sympathetic dystrophy) (bilateral hands and feet)   7. Major depressive disorder with current active episode, unspecified  depression episode severity, unspecified whether recurrent   8. Lumbar facet arthropathy    General Recommendations: The pain condition that the patient suffers from is best treated with a multidisciplinary approach that involves an increase in physical activity to prevent de-conditioning and worsening of the pain cycle, as well as psychological counseling (formal and/or informal) to address the co-morbid psychological affects of pain. Treatment will often involve judicious use of pain medications and interventional procedures to decrease the pain, allowing the patient to participate in the physical activity that will ultimately produce long-lasting pain reductions. The goal of the multidisciplinary approach is to return the patient to a higher level of overall function and to restore their ability to perform activities of daily living.  Patient is a 58 year old female with a impressive past medical history who presents to establish care for chronic pain associated with RSD, lumbar postlaminectomy pain syndrome, failed back surgical syndrome, cervical spine fusion syndrome.  Of note patient has an impressive surgical history with a L5-S1 lumbar discectomy 1995 followed by L5-S1 lumbar fusion in 1998.  Patient had a spinal cord stimulator placed in 1999 with revision in 2000 and subsequent removal in November 2000 for cellulitis and associated complications.  She went on to have a thoracic laminectomy in March 2001.  These were done by [facility] and Dr. Rosana Hoes at [facility].  During her SCS revision, she did experience cellulitis secondary to staph infection.  Patient also has a history of trigeminal neuralgia status post gamma knife radiation in 2005.  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  Patient also has a history of a bladder tumor has had subsequent  bladder biopsies after bladder tumor removal which were negative.  In 2018 patient went on to have a C4-C7 cervical fusion for symptoms of cervical radiculopathy.  She states prior to the cervical fusion she did undergo 2 sets of cervical facet medial branch nerve blocks at C4, 5, 6, 7  which were beneficial for axial neck pain.  She never went on to have radiofrequency ablation.  In regards to medication management, patient has been on very high-dose opioid therapy in the past which she is currently weaning herself down from.  In the past she was on MS Contin 200 mg 3 times daily which she has reduced down to 200 mg every other day. I had an extensive discussion with the patient regarding my concerns about her high dose MME.  Patient was previously on 200 mg of morphine up to 3 times a day but has weaned herself down to 200 mg daily.  I explained to the patient at her first clinic visit that if she were to be taken on for chronic opioid therapy, her dose would not exceed 60 mg twice daily total MME equals 120.  Patient has been seen by pain psychology and no red flags regarding substance abuse misuse.  I informed her that I could take her on for opioid medications at a reduced dose for reasons listed above.  Patient is unaware how her body will respond but we can trial 60 mg twice daily.  Patient is having increased low back pain and lumbar MRI was performed in early February that shows severe lumbar facet arthropathy at lower lumbar levels primarily L2, L3, L4, L5.  Discussed diagnostic lumbar facet medial branch nerve blocks.  Risks and benefits were reviewed and patient like to proceed.  Plan: -Sign opioid contract -Morphine 60 mg twice daily, prescription for 1 month -Continue all multimodal analgesics including Robaxin 750 twice daily PRN, Lyrica 50 mg 3 times daily, Topamax 50 mg every other day. -Diagnostic lumbar facet medial branch nerve blocks at L2, L3, L4, L5   Plan of Care   Pharmacotherapy (Medications Ordered): Meds ordered this encounter  Medications  . morphine (MS CONTIN) 60 MG 12 hr tablet    Sig: Take 1 tablet (60 mg total) by mouth every 12 (twelve) hours for 30 days. Must last 30 days.    Dispense:  60 tablet    Refill:  0    [place] STOP ACT - Not applicable. Fill one day early if pharmacy is closed on scheduled refill date.   Lab-work, procedure(s), and/or referral(s): Orders Placed This Encounter  Procedures  . LUMBAR FACET(MEDIAL BRANCH NERVE BLOCK) MBNB    Time Note: Greater than 50% of the 40 minute(s) of face-to-face time spent with Ms. Heatwole, was spent in counseling/coordination of care regarding: Ms. Fratto primary cause of pain, the treatment plan, treatment alternatives, the risks and possible complications of proposed treatment, medication side effects, going over the informed consent, the opioid analgesic risks and possible complications, the results, interpretation and significance of  her recent diagnostic interventional treatment(s), the appropriate use of her medications, realistic expectations, the goals of pain management (increased in functionality), the difference between disability and impairment, the medication agreement, the importance of providing [place] with accurate post-procedure information, the patient's responsibilities when it comes to controlled substances and the need to collect and read the AVS material.  Pharmacological management options:  Opioid Analgesics: We'll take over management today. See above orders Membrane stabilizer: We have discussed the possibility of optimizing this mode of therapy, if tolerated Muscle relaxant: We have discussed the possibility of a trial NSAID: We have discussed the possibility of a trial Other analgesic(s): To be determined at a later time   Interventional management options:  Considering:   Diagnostic lumbar facet medial branch nerve blocks L2, L3, L4,  L5 Cervical facet medial branch  nerve blocks and possible cervical RFA had them done March 2018 prior to cervical spine fusion and they were effective.    Provider-requested follow-up: No follow-ups on file.  Future Appointments  Date Time Provider [place]  07/29/2018 10:00 AM [name], MD [facility] None  08/20/2018 12:15 PM [name], MD [facility] None  08/27/2018  3:00 PM Edgardo Roys, PsyD [facility] CPR  08/31/2018  3:45 PM Bjorn Loser, MD [facility] None  09/22/2018 10:00 AM Edgardo Roys, PsyD [facility] CPR  10/08/2018  1:00 PM Edgardo Roys, PsyD [facility] CPR  10/29/2018  1:00 PM Edgardo Roys, PsyD [facility] CPR  11/12/2018  1:00 PM Edgardo Roys, PsyD [facility] CPR    Primary Care Physician: [name], PA Location: [facility] Note by: [name], M.D Date: 07/23/2018; Time: 11:57 AM  Patient Instructions  Sign contractPreparing for Procedure with Sedation Instructions: . Oral Intake: Do not eat or drink anything for at least 8 hours prior to your procedure. . Transportation: Public transportation is not allowed. Bring an adult driver. The driver must be physically present in our waiting room before any procedure can be started. Marland Kitchen Physical Assistance: Bring an adult capable of physically assisting you, in the event you need help. . Blood Pressure Medicine: Take your blood pressure medicine with a sip of water the morning of the procedure. . Insulin: Take only  of your normal insulin dose. . Preventing infections: Shower with an antibacterial soap the morning of your procedure. . Build-up your immune system: Take 1000 mg of Vitamin C with every meal (3 times a day) the day prior to your procedure. . Pregnancy: If you are pregnant, call and cancel the procedure. . Sickness: If you have a cold, fever, or any active infections, call and cancel the procedure. . Arrival: You must be in the facility at least 30 minutes prior to your scheduled  procedure. . Children: Do not bring children with you. . Dress appropriately: Bring dark clothing that you would not mind if they get stained. . Valuables: Do not bring any jewelry or valuables. Procedure appointments are reserved for interventional treatments only. Marland Kitchen No Prescription Refills. . No medication changes will be discussed during procedure appointments. No disability issues will be discussed.

## 2018-07-23 NOTE — Progress Notes (Signed)
Safety precautions to be maintained throughout the outpatient stay will include: orient to surroundings, keep bed in low position, maintain call bell within reach at all times, provide assistance with transfer out of bed and ambulation.  

## 2018-07-29 ENCOUNTER — Ambulatory Visit: Payer: Self-pay | Admitting: Student in an Organized Health Care Education/Training Program

## 2018-08-05 ENCOUNTER — Ambulatory Visit: Payer: Self-pay | Admitting: Student in an Organized Health Care Education/Training Program

## 2018-08-20 ENCOUNTER — Other Ambulatory Visit: Payer: Self-pay

## 2018-08-20 ENCOUNTER — Ambulatory Visit
Payer: Medicaid Other | Attending: Student in an Organized Health Care Education/Training Program | Admitting: Student in an Organized Health Care Education/Training Program

## 2018-08-20 DIAGNOSIS — Z9889 Other specified postprocedural states: Secondary | ICD-10-CM | POA: Diagnosis not present

## 2018-08-20 DIAGNOSIS — G894 Chronic pain syndrome: Secondary | ICD-10-CM | POA: Diagnosis not present

## 2018-08-20 DIAGNOSIS — Z981 Arthrodesis status: Secondary | ICD-10-CM | POA: Diagnosis not present

## 2018-08-20 DIAGNOSIS — M47816 Spondylosis without myelopathy or radiculopathy, lumbar region: Secondary | ICD-10-CM

## 2018-08-20 DIAGNOSIS — T85192S Other mechanical complication of implanted electronic neurostimulator (electrode) of spinal cord, sequela: Secondary | ICD-10-CM

## 2018-08-20 DIAGNOSIS — M47812 Spondylosis without myelopathy or radiculopathy, cervical region: Secondary | ICD-10-CM

## 2018-08-20 DIAGNOSIS — F329 Major depressive disorder, single episode, unspecified: Secondary | ICD-10-CM

## 2018-08-20 DIAGNOSIS — G9050 Complex regional pain syndrome I, unspecified: Secondary | ICD-10-CM

## 2018-08-20 DIAGNOSIS — Q761 Klippel-Feil syndrome: Secondary | ICD-10-CM

## 2018-08-20 MED ORDER — MORPHINE SULFATE ER 60 MG PO TBCR: 60.0000 mg | EXTENDED_RELEASE_TABLET | Freq: Two times a day (BID) | ORAL | 0 refills | Status: AC

## 2018-08-20 MED ORDER — PREGABALIN 100 MG PO CAPS: 100.0000 mg | ORAL_CAPSULE | Freq: Two times a day (BID) | ORAL | 2 refills | Status: DC

## 2018-08-20 MED ORDER — MORPHINE SULFATE ER 60 MG PO TBCR: 60.0000 mg | EXTENDED_RELEASE_TABLET | Freq: Two times a day (BID) | ORAL | 0 refills | Status: DC

## 2018-08-20 NOTE — Progress Notes (Signed)
Pain Management Virtual Encounter Note - Virtual Visit via [place] (real-time audio visits between healthcare provider and patient).  Patient's Phone No. & Preferred Pharmacy:  [phone] (home); [phone] (mobile); (Preferred) [phone] [email or web address]  [id number] [name], [facility] [address] [place] [address] Phone: [phone] Fax: [phone]   Pre-screening note:  Our staff contacted [name] and offered her an "in person", "face-to-face" appointment versus a telephone encounter. She indicated preferring the telephone encounter, at this time.  Reason for Virtual Visit: COVID-19*  Social distancing based on CDC and AMA recommendations.   I contacted [name] on 08/20/2018 at 1:26 PM via video conference and clearly identified myself as [name], MD. I verified that I was speaking with the correct person using two identifiers (Name and date of birth: October 09, 1960).  Advanced Informed Consent I sought verbal advanced consent from [name] for virtual visit interactions. I informed [name] of possible security and privacy concerns, risks, and limitations associated with providing "not-in-person" medical evaluation and management services. I also informed [name] of the availability of "in-person" appointments. Finally, I informed her that there would be a charge for the virtual visit and that she could be  personally, fully or partially, financially responsible for it. [name] expressed understanding and agreed to proceed.   Historic Elements   [name] is a 58 y.o. year old, female patient evaluated today after her last encounter by our practice on 07/23/2018. Ms. Treat  has a past medical history of Anxiety, Arthritis, Bladder cancer ([place]), Cancer of kidney ([place]), DDD (degenerative disc disease), cervical, Depression, Failed back syndrome, Fibromyalgia, GERD (gastroesophageal reflux disease), Heart  murmur, Hyperlipemia, Hypertension, IBS (irritable bowel syndrome), Kidney failure, Renal insufficiency, RSD (reflex sympathetic dystrophy), Seizure ([place]), Stomach ulcer, and Trigeminal neuralgia. She also  has a past surgical history that includes Transurethral resection of bladder tumor with gyrus (turbt-gyrus) (2012, 2013) and Nephroureterectomy (2011). [name] has a current medication list which includes the following prescription(s): bupropion, dicyclomine, escitalopram, meclizine, methocarbamol, morphine, ondansetron, sucralfate, trazodone, triamcinolone cream, morphine, pregabalin, and topiramate. She  reports that she has never smoked. She has never used smokeless tobacco. She reports that she does not drink alcohol or use drugs. [name] is allergic to methadone; other; and tape.   HPI  I last saw her on 07/23/2018. She is being evaluated for medication management.   Patient is now endorsing neck pain that is worse with cervical extension.  She does have a history of cervical fusion from C4-C7.  Patient previously had cervical facet medial branch nerve blocks which she found effective but for some reason did not have an ablation done at her previous pain clinic.  She is requesting cervical facet blocks for her neck and shoulder pain.  This is reasonable since her neck pain seems to be related to cervical spondylosis and facet arthropathy.  Also refill medications as below.  Pharmacotherapy Assessment  Analgesic: Morphine 60 mg twice daily MME/day: 120 mg/day.   07/23/2018  1   07/23/2018  Morphine Sulf Er 60 MG Tablet  60.00 30 Bi Lat   [id number]   [place] ([id number])   0  120.00 MME  Medicaid   [place]     Monitoring: Pharmacotherapy: No side-effects or adverse reactions reported. [place] PMP: PDMP reviewed during this encounter.       Compliance: No problems identified or detected. Plan: Refer to "POC".  Review of recent tests  MR LUMBAR SPINE WO CONTRAST  CLINICAL DATA:  Low back pain.  Multiple  prior surgeries x7.  EXAM: MRI LUMBAR SPINE WITHOUT CONTRAST  TECHNIQUE: Multiplanar, multisequence MR imaging of the lumbar spine was performed. No intravenous contrast was administered.  COMPARISON:  None.  FINDINGS: Segmentation:  Standard.  Alignment:  Physiologic.  Vertebrae: No fracture, evidence of discitis, or aggressive bone lesion. T1 and T2 hyperintense L3 vertebral body bone lesion most consistent with a hemangioma.  Conus medullaris and cauda equina: Conus extends to the T12 level. Conus and cauda equina appear normal.  Paraspinal and other soft tissues: No acute paraspinal abnormality. Prior left nephrectomy.  Disc levels:  Disc spaces: Interbody fusion at L5-S1.  T12-L1: No significant disc bulge. No evidence of neural foraminal stenosis. No central canal stenosis.  L1-L2: No significant disc bulge. No evidence of neural foraminal stenosis. No central canal stenosis. Mild bilateral facet arthropathy.  L2-L3: No significant disc bulge. No evidence of neural foraminal stenosis. No central canal stenosis.  L3-L4: Mild broad-based disc bulge. No evidence of neural foraminal stenosis. No central canal stenosis.  L4-L5: Broad-based disc bulge. Severe bilateral facet arthropathy with ligamentum flavum infolding. Moderate spinal stenosis. Right lateral recess stenosis. No foraminal stenosis.  L5-S1: Interbody fusion. No evidence of neural foraminal stenosis. No central canal stenosis.  IMPRESSION: 1. At L4-5 there is a broad-based disc bulge. Severe bilateral facet arthropathy with ligamentum flavum infolding. Moderate spinal stenosis. Right lateral recess stenosis.  Electronically Signed   By: Kathreen Devoid   On: 06/09/2018 14:49   Procedure visit on 04/06/2018  Component Date Value Ref Range Status  . Specific Gravity, UA 04/06/2018 1.020  1.005 - 1.030 Final  . pH, UA 04/06/2018 6.0  5.0 - 7.5 Final  . Color, UA 04/06/2018 Yellow  Yellow Final   . Appearance Ur 04/06/2018 Clear  Clear Final  . Leukocytes, UA 04/06/2018 Negative  Negative Final  . Protein, UA 04/06/2018 Negative  Negative/Trace Final  . Glucose, UA 04/06/2018 Negative  Negative Final  . Ketones, UA 04/06/2018 Negative  Negative Final  . RBC, UA 04/06/2018 Negative  Negative Final  . Bilirubin, UA 04/06/2018 Negative  Negative Final  . Urobilinogen, Ur 04/06/2018 0.2  0.2 - 1.0 mg/dL Final  . Nitrite, UA 04/06/2018 Negative  Negative Final  . Microscopic Examination 04/06/2018 See below:   Final  . WBC, UA 04/06/2018 None seen  0 - 5 /hpf Final  . RBC, UA 04/06/2018 None seen  0 - 2 /hpf Final  . Epithelial Cells (non renal) 04/06/2018 0-10  0 - 10 /hpf Final  . Mucus, UA 04/06/2018 Present* Not Estab. Final  . Bacteria, UA 04/06/2018 Many* None seen/Few Final   Assessment  The primary encounter diagnosis was Chronic pain syndrome. Diagnoses of Hx of lumbosacral spine surgery, History of lumbar fusion (L5-S1 fusion), Failed spinal cord stimulator, sequela, Cervical fusion syndrome (C4-C7), RSD (reflex sympathetic dystrophy) (bilateral hands and feet), Major depressive disorder with current active episode, unspecified depression episode severity, unspecified whether recurrent, Lumbar facet arthropathy, Cervical facet joint syndrome, and Cervical spondylosis without myelopathy were also pertinent to this visit.  Plan of Care  I have discontinued Luwana Frenz's pregabalin. I am also having her start on pregabalin and morphine. Additionally, I am having her maintain her escitalopram, methocarbamol, ondansetron, traZODone, sucralfate, dicyclomine, buPROPion, meclizine, topiramate, triamcinolone cream, and morphine.  In regards to medication management, we will refill morphine 60 mg twice daily.  No dose escalation beyond this.  Patient's previous dose at prior pain  clinic was 200 mg 3 times daily which she weaned down to 200 mg twice daily and was then on 200 mg daily.   Before taking the patient on I told her that her maximum dose at this clinic would be 60 mg twice daily and that we could consider interventional therapies and non-opioid analgesics to further manage her pain.  Given increased neck pain and paresthesias of upper extremities, we discussed increasing Lyrica from 50 mg 3 times daily to 100 mg twice daily.  Furthermore patient could benefit from cervical facet medial branch nerve blocks which she has done in the past at C4, C5, C6, C7 bilaterally.    Nimrat Woolworth has a history of greater than 3 months of moderate to severe pain which is resulted in functional impairment.  The patient has tried various conservative therapeutic options such as NSAIDs, Tylenol, muscle relaxants, physical therapy which was inadequately effective.  Patient's pain is predominantly axial with findings suggestive of facet arthropathy.  Cervical facet medial branch nerve blocks were discussed with the patient.  Risks and benefits were reviewed.  Patient would like to proceed with bilateral C4, C5, C6, C7 medial branch nerve block.  Fill dates 08/22/2018 and 09/21/2018  Pharmacotherapy (Medications Ordered): Meds ordered this encounter  Medications  . pregabalin (LYRICA) 100 MG capsule    Sig: Take 1 capsule (100 mg total) by mouth 2 (two) times daily.    Dispense:  60 capsule    Refill:  2  . morphine (MS CONTIN) 60 MG 12 hr tablet    Sig: Take 1 tablet (60 mg total) by mouth every 12 (twelve) hours for 30 days. Must last 30 days.    Dispense:  60 tablet    Refill:  0    [place] STOP ACT - Not applicable. Fill one day early if pharmacy is closed on scheduled refill date.  . morphine (MS CONTIN) 60 MG 12 hr tablet    Sig: Take 1 tablet (60 mg total) by mouth every 12 (twelve) hours for 30 days. Must last 30 days.    Dispense:  60 tablet    Refill:  0    [place] STOP ACT - Not applicable. Fill one day early if pharmacy is closed on scheduled refill date.   Orders:  Orders Placed This  Encounter  Procedures  . CERVICAL FACET (MEDIAL BRANCH NERVE BLOCK)     Standing Status:   Future    Standing Expiration Date:   09/19/2018    Scheduling Instructions:     Side: Bilateral     Level: C3-4, C4-5, C5-6 Facet joints (C4, C5, C6, & C7 Medial Branch Nerves)     Sedation: With Sedation.     Timeframe: ASAA    Order Specific Question:   Where will this procedure be performed?    Answer:   [facility]   Follow-up plan:   Return for Procedure, After COVID-19 restrictions lifted.   I discussed the assessment and treatment plan with the patient. The patient was provided an opportunity to ask questions and all were answered. The patient agreed with the plan and demonstrated an understanding of the instructions.  Patient advised to call back or seek an in-person evaluation if the symptoms or condition worsens.  Total duration of non-face-to-face encounter: 25 minutes.  Note by: [name], MD Date: 08/20/2018; Time: 1:26 PM  Disclaimer:  * Given the special circumstances of the COVID-19 pandemic, the federal government has announced that [employer] for [employer] (  OCR) will exercise its enforcement discretion and will not impose penalties on physicians using telehealth in the event of noncompliance with regulatory requirements under the [place] and Accountability Act (HIPAA) in connection with the good faith provision of telehealth during the NATFT-73 national public health emergency. (AMA)

## 2018-08-27 ENCOUNTER — Ambulatory Visit: Payer: Medicaid Other | Admitting: Psychology

## 2018-08-31 ENCOUNTER — Ambulatory Visit: Payer: Medicaid Other | Admitting: Urology

## 2018-09-01 ENCOUNTER — Other Ambulatory Visit: Payer: Self-pay

## 2018-09-01 ENCOUNTER — Telehealth: Payer: Self-pay | Admitting: Urology

## 2018-09-01 ENCOUNTER — Telehealth (INDEPENDENT_AMBULATORY_CARE_PROVIDER_SITE_OTHER): Payer: Medicaid Other | Admitting: Urology

## 2018-09-01 DIAGNOSIS — C689 Malignant neoplasm of urinary organ, unspecified: Secondary | ICD-10-CM

## 2018-09-01 DIAGNOSIS — N3946 Mixed incontinence: Secondary | ICD-10-CM | POA: Diagnosis not present

## 2018-09-01 DIAGNOSIS — N8111 Cystocele, midline: Secondary | ICD-10-CM

## 2018-09-01 MED ORDER — MIRABEGRON ER 25 MG PO TB24: 25.0000 mg | ORAL_TABLET | Freq: Every day | ORAL | 3 refills | Status: DC

## 2018-09-01 NOTE — Progress Notes (Signed)
Virtual Visit via Telephone Note  I connected with Lindsay Austin on 09/01/2018 at [place] by telephone as the audio/visual did not allow sound and verified that I am speaking with the correct person using two identifiers.  They are located at home.    I am located at my home.    This visit type was conducted due to national recommendations for restrictions regarding the COVID-19 Pandemic (e.g. social distancing).  This format is felt to be most appropriate for this patient at this time.  All issues noted in this document were discussed and addressed.  No physical exam was performed.   I discussed the limitations, risks, security and privacy concerns of performing an evaluation and management service by telephone and the availability of in person appointments. I also discussed with the patient that there may be a patient responsible charge related to this service. The patient expressed understanding and agreed to proceed.   History of Present Illness: Lindsay Austin is a 58 year old Caucasian female with a history of urothelial carcinoma (s/p left nephroureterectomy and BCG dating back to 2013), cystocele, rectocele, OAB and mixed incontinence who has been given an 8 week trial of Vesicare.   She has failed Mybetriq.  It caused hypertension and urinary retention.  She has failed Toviaz and her insurance would not cover the medication and it was ineffective.  Her insurance would also not cover the Detrol.     She called the office back and stated the Vesicare was given her a headache, abdominal pain and back pain.  She has been without any type of incontinence medication for the last month.  She states that she is soaking through 2 depends during the day and 2 depends during the night.  She states when she gets the urge to urinate she cannot make it to the bathroom on time and the urine just floods out.  Patient denies any gross hematuria, dysuria or suprapubic/flank pain.  Patient denies any fevers, chills,  nausea or vomiting.   She states the Myrbetriq 25 mg daily had worked for her in the past and she would like to try that dosage again.  She also feels that her rectocele/cystocele may be getting worse as she is finding herself having to place her fingers in her vaginal canal and press up in order to have a bowel movement.  She states that she does not do this she feels that the bowel movements are blocked and she cannot defecate.     Observations/Objective: She was dressed in her robe and pajamas, but she appeared well groomed.  She also did not appear distressed.  Of note, her daughter is in the hospital in labor and is dilated to 4 cm.    Assessment and Plan:  1. Mixed incontinence  -We will send a prescription for Myrbetriq 25 mg daily to her pharmacy, she will contact us for any untoward side effects -She will follow-up in 1 month's time with Dr. Matilde Sprang to assess the effectiveness of the lower dose of the Myrbetriq  2. Cystocele/Rectocele - She will return in one month's time with Dr. Matilde Sprang for an exam to see if she has worsening of her cystocele/rectocele  3. History of urothelial carcinoma - Followed by Dr. Diamantina Providence  Follow Up Instructions:  Lindsay Austin will follow up with Dr. Matilde Sprang in one month.    I discussed the assessment and treatment plan with the patient. The patient was provided an opportunity to ask questions and all were answered.  The patient agreed with the plan and demonstrated an understanding of the instructions.   The patient was advised to call back or seek an in-person evaluation if the symptoms worsen or if the condition fails to improve as anticipated.  I provided 16 minutes of non-face-to-face time during this encounter.   Khyle Goodell, PA-C

## 2018-09-01 NOTE — Telephone Encounter (Signed)
Would you call Lindsay Austin and schedule her a follow up appointment with Dr. Matilde Sprang in one month?

## 2018-09-22 ENCOUNTER — Encounter: Payer: Self-pay | Admitting: Student in an Organized Health Care Education/Training Program

## 2018-09-22 ENCOUNTER — Encounter: Payer: Self-pay | Admitting: Psychology

## 2018-09-22 ENCOUNTER — Other Ambulatory Visit: Payer: Self-pay

## 2018-09-22 ENCOUNTER — Encounter: Payer: Medicaid Other | Attending: Psychology | Admitting: Psychology

## 2018-09-22 DIAGNOSIS — G9050 Complex regional pain syndrome I, unspecified: Secondary | ICD-10-CM | POA: Insufficient documentation

## 2018-09-22 DIAGNOSIS — F331 Major depressive disorder, recurrent, moderate: Secondary | ICD-10-CM | POA: Diagnosis not present

## 2018-09-22 DIAGNOSIS — Z9889 Other specified postprocedural states: Secondary | ICD-10-CM | POA: Diagnosis present

## 2018-09-22 DIAGNOSIS — Q761 Klippel-Feil syndrome: Secondary | ICD-10-CM | POA: Insufficient documentation

## 2018-09-22 DIAGNOSIS — T85192S Other mechanical complication of implanted electronic neurostimulator (electrode) of spinal cord, sequela: Secondary | ICD-10-CM | POA: Diagnosis present

## 2018-09-22 NOTE — Progress Notes (Signed)
Neuropsychology Visit  Patient:  Lindsay Austin   DOB: 01-Oct-1960  MR Number: [id number]  Location: [facility] [place], [place] [id number] [place] [id number] Dept: [phone]  Date of Service: 09/22/2018  Start: 10 AM End: 11 AM  TELEHEALTH NOTE  Due to national recommendations of social distancing due to COVID 19, an audio/video telehealth visit is felt to be most appropriate for this patient at this time.  See Chart message from today for the patient's consent to telehealth from [place].     I verified that I am speaking with the correct person using two identifiers.  Location of patient: Home   Location of provider: Office Method of communication: WebEx, video and auditory Names of participants :  Lindsay Austin obtaining consent  Established patient Time spent on call: 1 Hour  Duration of Service: 1 Hour  Provider/Observer:     Edgardo Roys PsyD  Chief Complaint:      Chief Complaint  Patient presents with  . Pain  . Back Pain  . Depression    Reason For Service:     Lindsay Austin is a 58 year old female referred by [name], MD for therapeutic interventions as part of a multidisciplinary approach to treating the patient's severe chronic pain.  The patient expressed desire to begin being followed by Dr. Holley Raring through his interventional pain clinic and pain management program.  The patient has a long history of multiple spinal surgeries through the years dating back to 25.  The patient also has a long history of treatment of her severe and chronic pain symptoms with opioid-based medications along with other medication interventions.  The patient also has a history of spinal cord stimulator placement with revisions and ultimately removed due to infection.  The patient was referred as part of this multidisciplinary  approach for treating her chronic longstanding pain.  I will not go into full detail regarding her surgical and medical history as it is been well documented and summarized in Dr. Elwyn Lade initial evaluation dated 01/15/2018.  However during the visit here at our office the patient reports that she had an initial injury on 11/08/1989.  She has had L5-S1 fusion and a total of 6 back surgeries, spinal cord stimulator, kidney cancer, bladder cancer and in 2018 a 3 level cervical fusion C4-C7 levels.  The patient reports that most recent MRI is shown significant degenerative disc disease and severe arthritis at L4-L5.  The patient reports that she uses a cane, walker and at times a wheelchair.  The patient reports that she moved here 1 year ago and had been living in [facility] prior to that.  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  The patient reports that she helps with her grandkids as far as childcare as much as she can.  Patient reports that it took her a long time to get her insurance transferred and approved here.  She reports that that finally was completed in August 2019.  The patient reports that she has been on opioid-based medications along with other medication since 1991.  The patient reports that the primary opiate-based medication she is taken is MS Contin.  The patient reports that she has had taken steps to reduce the amount of  opiate medication she is taking and while she knows that there are significant issues with long-term opiate use the patient is quite distressed by the potential of not being able to manage her pain in the future.  Treatment Interventions:  Coping/cognitive behavioral therapy  Participation Level:   Active  Participation Quality:  Appropriate and Attentive      Behavioral Observation:  Well Groomed, Alert, and  Appropriate.   Current Psychosocial Factors: The patient reports some stress due to Covid, but that her pain has continued and new injury to tail bone has been giving her a lot of trouble over the past week or so.  Patient reports that family stressor not bad.  Content of Session:   Reviewed current symptoms and continue to work on therapeutic interventions around significant chronic pain symptoms and RSD.  The patient reports that she has had a lot of pain in both of her feet recently but is unsure whether some of it may be due to the development of peripheral neuropathy versus her RSD.  It was originally diagnosed as RSD.  The patient reports that an injury to her coccyx bone has been particularly problematic for her.  Effectiveness of Interventions: The patient was motivated and very engaged during the set therapy session today and has already been working on some of the interventions we have begun developing after our first visit.  The interaction/appointment was done via WebEx with a very good video and audio connection throughout the entire hour of the visit.  Target Goals:   Target goals include building better coping resources and strategies around chronic pain symptoms and significant depressive symptoms.  Goals Last Reviewed:   09/22/2018  Goals Addressed Today:    Goals addressed today had to do with issues associated with her chronic pain and coping with new acute pains that she has been having.  Impression/Diagnosis:   Lindsay Austin is a 58 year old female referred by [name], MD for therapeutic interventions as part of a multidisciplinary approach to treating the patient's severe chronic pain.  The patient expressed desire to begin being followed by Dr. Holley Raring through his interventional pain clinic and pain management program.  The patient has a long history of multiple spinal surgeries through the years dating back to 43.  The patient also has a long history of treatment of her  severe and chronic pain symptoms with opioid-based medications along with other medication interventions.  The patient also has a history of spinal cord stimulator placement with revisions and ultimately removed due to infection.  The patient was referred as part of this multidisciplinary approach for treating her chronic longstanding pain.  The patient describes significant severe chronic pain issues as well as issues of depression anxiety that did not exist prior to 1991.  The patient reports that significant loss of function, chronic pain, RSD developing and numerous neurological issues resulting from her cervical and spinal issues are quite problematic for her.  Diagnosis:   Moderate episode of recurrent major depressive disorder (HCC)  H/O lumbosacral spine surgery  RSD (reflex sympathetic dystrophy)  Cervical fusion syndrome  Failed spinal cord stimulator, sequela    Ilean Skill, Psy.D. Clinical Psychologist Neuropsychologist

## 2018-09-25 ENCOUNTER — Other Ambulatory Visit
Admission: RE | Admit: 2018-09-25 | Discharge: 2018-09-25 | Disposition: A | Discharge status: Home / Self Care | Payer: Medicaid Other | Source: Ambulatory Visit | Attending: Student in an Organized Health Care Education/Training Program | Admitting: Student in an Organized Health Care Education/Training Program

## 2018-09-25 ENCOUNTER — Other Ambulatory Visit: Payer: Self-pay

## 2018-09-25 DIAGNOSIS — Z1159 Encounter for screening for other viral diseases: Secondary | ICD-10-CM | POA: Insufficient documentation

## 2018-09-26 LAB — NOVEL CORONAVIRUS, NAA (HOSP ORDER, SEND-OUT TO REF LAB; TAT 18-24 HRS): SARS-CoV-2, NAA: NOT DETECTED

## 2018-09-28 ENCOUNTER — Telehealth: Payer: Self-pay | Admitting: Urology

## 2018-09-28 NOTE — Telephone Encounter (Signed)
Appointment for nurse visit

## 2018-09-28 NOTE — Telephone Encounter (Signed)
Pt called and states that she feels like she has a UTI. She has freq urination burning she feels tired, she denies fever and nausea.Please advise.

## 2018-09-29 ENCOUNTER — Other Ambulatory Visit: Payer: Self-pay

## 2018-09-29 ENCOUNTER — Ambulatory Visit: Payer: Medicaid Other | Admitting: Family Medicine

## 2018-09-29 VITALS — BP 130/81 | HR 83

## 2018-09-29 DIAGNOSIS — R300 Dysuria: Secondary | ICD-10-CM

## 2018-09-29 LAB — URINALYSIS, COMPLETE
Bilirubin, UA: NEGATIVE
Glucose, UA: NEGATIVE
Ketones, UA: NEGATIVE
Nitrite, UA: NEGATIVE
Specific Gravity, UA: 1.02 (ref 1.005–1.030)
Urobilinogen, Ur: 0.2 mg/dL (ref 0.2–1.0)
pH, UA: 7 (ref 5.0–7.5)

## 2018-09-29 LAB — MICROSCOPIC EXAMINATION: WBC, UA: >30 /hpf — AB (ref 0–5)

## 2018-09-29 MED ORDER — SULFAMETHOXAZOLE-TRIMETHOPRIM 800-160 MG PO TABS: 1.0000 | ORAL_TABLET | Freq: Two times a day (BID) | ORAL | 0 refills | Status: DC

## 2018-09-29 NOTE — Progress Notes (Signed)
Patient presents today with urinary frequency and dysuria. Patient states her symptoms began 4 days ago. A urine was collected for UA, UCX. Patient states she has not been on ABX or had any Urological surgeries in the last 30 days. Dr. Erlene Quan reviewed the UA in office today. Bactrim DS was sent to pharmacy.

## 2018-09-30 ENCOUNTER — Encounter: Payer: Self-pay | Admitting: Student in an Organized Health Care Education/Training Program

## 2018-09-30 ENCOUNTER — Ambulatory Visit (HOSPITAL_BASED_OUTPATIENT_CLINIC_OR_DEPARTMENT_OTHER): Payer: Medicaid Other | Admitting: Student in an Organized Health Care Education/Training Program

## 2018-09-30 ENCOUNTER — Ambulatory Visit
Admission: RE | Admit: 2018-09-30 | Discharge: 2018-09-30 | Disposition: A | Discharge status: Home / Self Care | Payer: Medicaid Other | Source: Ambulatory Visit | Attending: Student in an Organized Health Care Education/Training Program | Admitting: Student in an Organized Health Care Education/Training Program

## 2018-09-30 ENCOUNTER — Telehealth: Payer: Self-pay | Admitting: *Deleted

## 2018-09-30 VITALS — BP 102/57 | HR 63 | Temp 98.8°F | Resp 20 | Ht 64.0 in | Wt 175.0 lb

## 2018-09-30 DIAGNOSIS — Z01818 Encounter for other preprocedural examination: Secondary | ICD-10-CM | POA: Diagnosis present

## 2018-09-30 DIAGNOSIS — M47812 Spondylosis without myelopathy or radiculopathy, cervical region: Secondary | ICD-10-CM | POA: Insufficient documentation

## 2018-09-30 DIAGNOSIS — G894 Chronic pain syndrome: Secondary | ICD-10-CM | POA: Insufficient documentation

## 2018-09-30 DIAGNOSIS — Q761 Klippel-Feil syndrome: Secondary | ICD-10-CM

## 2018-09-30 MED ORDER — DEXAMETHASONE SODIUM PHOSPHATE 10 MG/ML IJ SOLN
20.0000 mg | Freq: Once | INTRAMUSCULAR | Status: AC
  Administered 2018-09-30: 20 mg
  Filled 2018-09-30: qty 2

## 2018-09-30 MED ORDER — ROPIVACAINE HCL 2 MG/ML IJ SOLN
INTRAMUSCULAR | Status: AC
  Filled 2018-09-30: qty 10

## 2018-09-30 MED ORDER — ROPIVACAINE HCL 2 MG/ML IJ SOLN
18.0000 mL | Freq: Once | INTRAMUSCULAR | Status: AC
  Administered 2018-09-30: 10 mL via PERINEURAL
  Filled 2018-09-30: qty 20

## 2018-09-30 MED ORDER — FENTANYL CITRATE (PF) 100 MCG/2ML IJ SOLN
25.0000 ug | INTRAMUSCULAR | Status: DC | PRN
  Administered 2018-09-30: 100 ug via INTRAVENOUS
  Filled 2018-09-30: qty 2

## 2018-09-30 MED ORDER — LIDOCAINE HCL 2 % IJ SOLN
20.0000 mL | Freq: Once | INTRAMUSCULAR | Status: AC
  Administered 2018-09-30: 400 mg
  Filled 2018-09-30: qty 20

## 2018-09-30 NOTE — Patient Instructions (Signed)

## 2018-09-30 NOTE — Progress Notes (Signed)
Patient's Name: Lindsay Austin  MRN: [id number]  Referring Provider: [name], [place]  DOB: Jan 01, 1961  PCP: [name], [place]  DOS: 09/30/2018  Note by: [name], MD  Service setting: Ambulatory outpatient  Specialty: Interventional Pain Management  Patient type: Established  Location: [facility] (AMB) Pain Management Facility  Visit type: Interventional Procedure   Primary Reason for Visit: Interventional Pain Management Treatment. CC: Neck Pain  Procedure:          Anesthesia, Analgesia, Anxiolysis:  Type: Cervical Facet Medial Branch Block(s)  #1  Primary Purpose: Diagnostic Region: Posterolateral cervical spine Level: C4, C5, C6, & C7 Medial Branch Level(s). Injecting these levels blocks the C4-5, C5-6, and C6-7 cervical facet joints. Laterality: Bilateral  Type: Moderate (Conscious) Sedation combined with Local Anesthesia Indication(s): Analgesia and Anxiety Route: Intravenous (IV) IV Access: Secured Sedation: Meaningful verbal contact was maintained at all times during the procedure  Local Anesthetic: Lidocaine 1-2%  Position: Prone with head of the table raised to facilitate breathing.   Indications: 1. Cervical spondylosis without myelopathy   2. Preoperative testing   3. Cervical fusion syndrome (C4-C7)   4. Chronic pain syndrome    Pain Score: Pre-procedure: 5 /10 Post-procedure: 3 /10  Pre-op Assessment:  Lindsay Austin is a 58 y.o. (year old), female patient, seen today for interventional treatment. 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 Lindsay Austin has a current medication list which includes the following prescription(s): bupropion, dicyclomine, escitalopram, meclizine, methocarbamol, mirabegron er, morphine, ondansetron, pregabalin, sucralfate, sulfamethoxazole-trimethoprim, trazodone, triamcinolone cream, and topiramate, and the following Facility-Administered  Medications: fentanyl. Her primarily concern today is the Neck Pain  Initial Vital Signs:  Pulse/HCG Rate: 72ECG Heart Rate: 65 Temp: 99 F (37.2 C) Resp: 18 BP: 130/90 SpO2: 96 %  BMI: Estimated body mass index is 30.04 kg/m as calculated from the following:   Height as of this encounter: 5\' 4"  (1.626 m).   Weight as of this encounter: 175 lb (79.4 kg).  Risk Assessment: Allergies: Reviewed. She is allergic to methadone; other; and tape.  Allergy Precautions: None required Coagulopathies: Reviewed. None identified.  Blood-thinner therapy: None at this time Active Infection(s): Reviewed. None identified. Lindsay Austin is afebrile  Site Confirmation: Lindsay Austin was asked to confirm the procedure and laterality before marking the site Procedure checklist: Completed Consent: Before the procedure and under the influence of no sedative(s), amnesic(s), or anxiolytics, the patient was informed of the treatment options, risks and possible complications. To fulfill our ethical and legal obligations, as recommended by the American Medical Association's Code of Ethics, I have informed the patient of my clinical impression; the nature and purpose of the treatment or procedure; the risks, benefits, and possible complications of the intervention; the alternatives, including doing nothing; the risk(s) and benefit(s) of the alternative treatment(s) or procedure(s); and the risk(s) and benefit(s) of doing nothing. The patient was provided information about the general risks and possible complications associated with the procedure. These may include, but are not limited to: failure to achieve desired goals, infection, bleeding, organ or nerve damage, allergic reactions, paralysis, and death. In addition, the patient was informed of those risks and complications associated to Spine-related procedures, such as failure to decrease pain; infection (i.e.: Meningitis, epidural or intraspinal abscess); bleeding (i.e.:  epidural hematoma, subarachnoid hemorrhage, or any other type of intraspinal or peri-dural bleeding); organ or nerve damage (i.e.: Any type of peripheral nerve, nerve root, or  spinal cord injury) with subsequent damage to sensory, motor, and/or autonomic systems, resulting in permanent pain, numbness, and/or weakness of one or several areas of the body; allergic reactions; (i.e.: anaphylactic reaction); and/or death. Furthermore, the patient was informed of those risks and complications associated with the medications. These include, but are not limited to: allergic reactions (i.e.: anaphylactic or anaphylactoid reaction(s)); adrenal axis suppression; blood sugar elevation that in diabetics may result in ketoacidosis or comma; water retention that in patients with history of congestive heart failure may result in shortness of breath, pulmonary edema, and decompensation with resultant heart failure; weight gain; swelling or edema; medication-induced neural toxicity; particulate matter embolism and blood vessel occlusion with resultant organ, and/or nervous system infarction; and/or aseptic necrosis of one or more joints. Finally, the patient was informed that Medicine is not an exact science; therefore, there is also the possibility of unforeseen or unpredictable risks and/or possible complications that may result in a catastrophic outcome. The patient indicated having understood very clearly. We have given the patient no guarantees and we have made no promises. Enough time was given to the patient to ask questions, all of which were answered to the patient's satisfaction. Ms. Brach has indicated that she wanted to continue with the procedure. Attestation: I, the ordering provider, attest that I have discussed with the patient the benefits, risks, side-effects, alternatives, likelihood of achieving goals, and potential problems during recovery for the procedure that I have provided informed consent. Date   Time:  09/30/2018 11:46 AM  Pre-Procedure Preparation:  Monitoring: As per clinic protocol. Respiration, ETCO2, SpO2, BP, heart rate and rhythm monitor placed and checked for adequate function Safety Precautions: Patient was assessed for positional comfort and pressure points before starting the procedure. Time-out: I initiated and conducted the "Time-out" before starting the procedure, as per protocol. The patient was asked to participate by confirming the accuracy of the "Time Out" information. Verification of the correct person, site, and procedure were performed and confirmed by me, the nursing staff, and the patient. "Time-out" conducted as per Joint Commission's Universal Protocol (UP.01.01.01). Time: 1231  Description of Procedure:          Laterality: Bilateral. The procedure was performed in identical fashion on both sides. Level: C4, C5, C6, & C7 Medial Branch Level(s). Area Prepped: Posterior Cervico-thoracic Region Prepping solution: 35M DuraPrep (Iodine Povacrylex [0.7% available iodine] and Isopropyl Alcohol, 74% w/w) Safety Precautions: Aspiration looking for blood return was conducted prior to all injections. At no point did we inject any substances, as a needle was being advanced. Before injecting, the patient was told to immediately notify me if she was experiencing any new onset of "ringing in the ears, or metallic taste in the mouth". No attempts were made at seeking any paresthesias. Safe injection practices and needle disposal techniques used. Medications properly checked for expiration dates. SDV (single dose vial) medications used. After the completion of the procedure, all disposable equipment used was discarded in the proper designated medical waste containers. Local Anesthesia: Protocol guidelines were followed. The patient was positioned over the fluoroscopy table. The area was prepped in the usual manner. The time-out was completed. The target area was identified using fluoroscopy.  A 12-in long, straight, sterile hemostat was used with fluoroscopic guidance to locate the targets for each level blocked. Once located, the skin was marked with an approved surgical skin marker. Once all sites were marked, the skin (epidermis, dermis, and hypodermis), as well as deeper tissues (fat, connective tissue and muscle)  were infiltrated with a small amount of a short-acting local anesthetic, loaded on a 10cc syringe with a 25G, 1.5-in  Needle. An appropriate amount of time was allowed for local anesthetics to take effect before proceeding to the next step. Local Anesthetic: Lidocaine 2.0% The unused portion of the local anesthetic was discarded in the proper designated containers. Technical explanation of process:   C4 Medial Branch Nerve Block (MBB): The target area for the C4 dorsal medial articular branch is the lateral concave waist of the articular pillar of C4. Under fluoroscopic guidance, a Quincke needle was inserted until contact was made with os over the postero-lateral aspect of the articular pillar of C4 (target area). After negative aspiration for blood, 1 mL of the nerve block solution was injected without difficulty or complication. The needle was removed intact. C5 Medial Branch Nerve Block (MBB): The target area for the C5 dorsal medial articular branch is the lateral concave waist of the articular pillar of C5. Under fluoroscopic guidance, a Quincke needle was inserted until contact was made with os over the postero-lateral aspect of the articular pillar of C5 (target area). After negative aspiration for blood, 1 mL of the nerve block solution was injected without difficulty or complication. The needle was removed intact. C6 Medial Branch Nerve Block (MBB): The target area for the C6 dorsal medial articular branch is the lateral concave waist of the articular pillar of C6. Under fluoroscopic guidance, a Quincke needle was inserted until contact was made with os over the  postero-lateral aspect of the articular pillar of C6 (target area). After negative aspiration for blood, 1 mL of the nerve block solution was injected without difficulty or complication. The needle was removed intact. C7 Medial Branch Nerve Block (MBB): The target for the C7 dorsal medial articular branch lies on the superior-medial tip of the C7 transverse process. Under fluoroscopic guidance, a Quincke needle was inserted until contact was made with os over the postero-lateral aspect of the articular pillar of C7 (target area). After negative aspiration for blood, 1 mL of the nerve block solution was injected without difficulty or complication. The needle was removed intact. Procedural Needles: 25-gauge, 3.5-inch, Quincke needles used for all levels. Nerve block solution: 8 cc solution made of 6 cc of 0.2% ropivacaine, 2 cc of Decadron 10 mg/cc.  1 cc injected at each level above.  Total steroid dose is 20 mg of Decadron.     Once the entire procedure was completed, the treated area was cleaned, making sure to leave some of the prepping solution back to take advantage of its long term bactericidal properties.  Anatomy Reference Guide:       Vitals:   09/30/18 1305 09/30/18 1313 09/30/18 1323 09/30/18 1333  BP: (!) 114/98 (!) 107/46 (!) 100/54 (!) 102/57  Pulse: 63     Resp: 13 20 (!) 21 20  Temp:  98.8 F (37.1 C)  98.8 F (37.1 C)  TempSrc:      SpO2: 97% 97% 92% 95%  Weight:      Height:        Start Time: 1231 hrs. End Time: 1305 hrs.  Imaging Guidance (Spinal):          Type of Imaging Technique: Fluoroscopy Guidance (Spinal) Indication(s): Assistance in needle guidance and placement for procedures requiring needle placement in or near specific anatomical locations not easily accessible without such assistance. Exposure Time: Please see nurses notes. Contrast: None used. Fluoroscopic Guidance: I was personally present during the  use of fluoroscopy. "Tunnel Vision Technique"  used to obtain the best possible view of the target area. Parallax error corrected before commencing the procedure. "Direction-depth-direction" technique used to introduce the needle under continuous pulsed fluoroscopy. Once target was reached, antero-posterior, oblique, and lateral fluoroscopic projection used confirm needle placement in all planes. Images permanently stored in EMR. Interpretation: No contrast injected. I personally interpreted the imaging intraoperatively. Adequate needle placement confirmed in multiple planes. Permanent images saved into the patient's record.  Antibiotic Prophylaxis:   Anti-infectives (From admission, onward)   None     Indication(s): None identified  Post-operative Assessment:  Post-procedure Vital Signs:  Pulse/HCG Rate: 6362 Temp: 98.8 F (37.1 C) Resp: 20 BP: (!) 102/57 SpO2: 95 %  EBL: None  Complications: No immediate post-treatment complications observed by team, or reported by patient.  Note: The patient tolerated the entire procedure well. A repeat set of vitals were taken after the procedure and the patient was kept under observation following institutional policy, for this type of procedure. Post-procedural neurological assessment was performed, showing return to baseline, prior to discharge. The patient was provided with post-procedure discharge instructions, including a section on how to identify potential problems. Should any problems arise concerning this procedure, the patient was given instructions to immediately contact us, at any time, without hesitation. In any case, we plan to contact the patient by telephone for a follow-up status report regarding this interventional procedure.  Comments:  No additional relevant information. 5 out of 5 strength bilateral upper extremity: Shoulder abduction, elbow flexion, elbow extension, thumb extension.  Plan of Care  Orders:  Orders Placed This Encounter  Procedures   Novel Coronavirus,  NAA ([facility])    Send patient to pre-admission testing for collection. Estimated turn-around time: 72 hrs.    Standing Status:   Future    Standing Expiration Date:   11/24/2018   [facility] C-ARM 1-60 MIN NO REPORT    Intraoperative interpretation by procedural physician at [place].    Standing Status:   Standing    Number of Occurrences:   1    Order Specific Question:   Reason for exam:    Answer:   Assistance in needle guidance and placement for procedures requiring needle placement in or near specific anatomical locations not easily accessible without such assistance.   Medications ordered for procedure: Meds ordered this encounter  Medications   fentaNYL (SUBLIMAZE) injection 25-50 mcg    Make sure Narcan is available in the pyxis when using this medication. In the event of respiratory depression (RR< 8/min): Titrate NARCAN (naloxone) in increments of 0.1 to 0.2 mg IV at 2-3 minute intervals, until desired degree of reversal.   dexamethasone (DECADRON) injection 20 mg   ropivacaine (PF) 2 mg/mL (0.2%) (NAROPIN) injection 18 mL   lidocaine (XYLOCAINE) 2 % (with pres) injection 400 mg   Medications administered: We administered fentaNYL, dexamethasone, ropivacaine (PF) 2 mg/mL (0.2%), and lidocaine.  See the medical record for exact dosing, route, and time of administration.  Disposition: Discharge home  Discharge Date & Time: 09/30/2018; 1335 hrs.   Follow-up plan:   Return for [place] appointment.     Future Appointments  Date Time Provider [place]  10/08/2018  1:00 PM Edgardo Roys, PsyD [facility] CPR  10/12/2018 10:30 AM Bjorn Loser, MD [facility] None  10/20/2018  1:15 PM [name], MD [facility] None  10/29/2018  1:00 PM Edgardo Roys, PsyD [facility] CPR  11/12/2018  1:00 PM [place],  Minette Brine, PsyD [facility] CPR   Primary Care Physician: [name], [place] Location: [facility] Outpatient [facility] Management Facility Note by: [name], MD Date: 09/30/2018; Time: 1:55 PM  Disclaimer:  Medicine is not an exact science. The only guarantee in medicine is that nothing is guaranteed. It is important to note that the decision to proceed with this intervention was based on the information collected from the patient. The Data and conclusions were drawn from the patient's questionnaire, the interview, and the physical examination. Because the information was provided in large part by the patient, it cannot be guaranteed that it has not been purposely or unconsciously manipulated. Every effort has been made to obtain as much relevant data as possible for this evaluation. It is important to note that the conclusions that lead to this procedure are derived in large part from the available data. Always take into account that the treatment will also be dependent on availability of resources and existing treatment guidelines, considered by other Pain Management Practitioners as being common knowledge and practice, at the time of the intervention. For Medico-Legal purposes, it is also important to point out that variation in procedural techniques and pharmacological choices are the acceptable norm. The indications, contraindications, technique, and results of the above procedure should only be interpreted and judged by a Board-Certified Interventional Pain Specialist with extensive familiarity and expertise in the same exact procedure and technique.

## 2018-09-30 NOTE — Progress Notes (Signed)
Nursing Pain Medication Assessment:  Safety precautions to be maintained throughout the outpatient stay will include: orient to surroundings, keep bed in low position, maintain call bell within reach at all times, provide assistance with transfer out of bed and ambulation.  Medication Inspection Compliance: Ms. Somoza did not comply with our request to bring her pills to be counted. She was reminded that bringing the medication bottles, even when empty, is a requirement.  Medication: None brought in. Pill/Patch Count: None available to be counted. Bottle Appearance: No container available. Did not bring bottle(s) to appointment. Filled Date: N/A Last Medication intake:  Today 

## 2018-09-30 NOTE — Telephone Encounter (Signed)
Has not yet started antibiotics. No fever. Has symptoms of burning, urgency, and frequency. Spoke with Dr. Holley Raring, ok to come for procedure.

## 2018-10-01 LAB — CULTURE, URINE COMPREHENSIVE

## 2018-10-02 ENCOUNTER — Telehealth: Payer: Self-pay | Admitting: *Deleted

## 2018-10-02 NOTE — Telephone Encounter (Signed)
Called for post procedure follow-up.Having pain and numbness in arms after cervical facet. Advised to apply heat to neck area, may require more time to achieve relief, symptoms may be worse for a few days following procedure. Advised to call if fever, mental status change.

## 2018-10-08 ENCOUNTER — Encounter: Payer: Self-pay | Admitting: Psychology

## 2018-10-08 ENCOUNTER — Encounter: Payer: Medicaid Other | Attending: Psychology | Admitting: Psychology

## 2018-10-08 ENCOUNTER — Other Ambulatory Visit: Payer: Self-pay

## 2018-10-08 DIAGNOSIS — G9050 Complex regional pain syndrome I, unspecified: Secondary | ICD-10-CM | POA: Diagnosis present

## 2018-10-08 DIAGNOSIS — T85192S Other mechanical complication of implanted electronic neurostimulator (electrode) of spinal cord, sequela: Secondary | ICD-10-CM | POA: Diagnosis present

## 2018-10-08 DIAGNOSIS — F331 Major depressive disorder, recurrent, moderate: Secondary | ICD-10-CM | POA: Diagnosis not present

## 2018-10-08 DIAGNOSIS — Q761 Klippel-Feil syndrome: Secondary | ICD-10-CM | POA: Diagnosis not present

## 2018-10-08 DIAGNOSIS — Z9889 Other specified postprocedural states: Secondary | ICD-10-CM | POA: Diagnosis not present

## 2018-10-08 NOTE — Progress Notes (Signed)
Neuropsychology Visit  Patient:  Mry Lamia   DOB: 1960/07/14  MR Number: [id number]  Location: [facility] [place], [place] [id number] [place] [address] Dept: [phone]  Date of Service: 10/08/2018  Start: 1:30 PM End: 2 PM AM  TELEHEALTH NOTE  Due to national recommendations of social distancing due to COVID 19, an audio/video telehealth visit is felt to be most appropriate for this patient at this time.  See Chart message from today for the patient's consent to telehealth from Ronald.     I verified that I am speaking with the correct person using two identifiers.  Location of patient: Home   Location of provider: Office Method of communication: WebEx, video and auditory Names of participants :  Montavis Schubring obtaining consent  Established patient Time spent on call: 1/2 Hour  Duration of Service: 1/2 Hour  Provider/Observer:     Edgardo Roys PsyD  Chief Complaint:      Chief Complaint  Patient presents with  . Depression  . Pain  . Back Pain    Reason For Service:     Ceairra Mccarver is a 58 year old female referred by [name], MD for therapeutic interventions as part of a multidisciplinary approach to treating the patient's severe chronic pain.  The patient expressed desire to begin being followed by Dr. Holley Raring through his interventional pain clinic and pain management program.  The patient has a long history of multiple spinal surgeries through the years dating back to 17.  The patient also has a long history of treatment of her severe and chronic pain symptoms with opioid-based medications along with other medication interventions.  The patient also has a history of spinal cord stimulator placement with revisions and ultimately removed due to infection.  The patient was referred as part of this  multidisciplinary approach for treating her chronic longstanding pain.  I will not go into full detail regarding her surgical and medical history as it is been well documented and summarized in Dr. Elwyn Lade initial evaluation dated 01/15/2018.  However during the visit here at our office the patient reports that she had an initial injury on 11/08/1989.  She has had L5-S1 fusion and a total of 6 back surgeries, spinal cord stimulator, kidney cancer, bladder cancer and in 2018 a 3 level cervical fusion C4-C7 levels.  The patient reports that most recent MRI is shown significant degenerative disc disease and severe arthritis at L4-L5.  The patient reports that she uses a cane, walker and at times a wheelchair.  The patient reports that she moved here 1 year ago and had been living in [facility] prior to that.  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  The patient reports that she helps with her grandkids as far as childcare as much as she can.  Patient reports that it took her a long time to get her insurance transferred and approved here.  She reports that that finally was completed in August 2019.  The patient reports that she has been on opioid-based medications along with other medication since 1991.  The patient reports that the primary opiate-based medication she is taken is MS Contin.  The patient reports that she has had taken steps to reduce the amount  of opiate medication she is taking and while she knows that there are significant issues with long-term opiate use the patient is quite distressed by the potential of not being able to manage her pain in the future.  Treatment Interventions:  Coping/cognitive behavioral therapy  Participation Level:   Active  Participation Quality:  Appropriate and Attentive      Behavioral Observation:  Well  Groomed, Alert, and Appropriate.   Current Psychosocial Factors: The patient reports some stress due to Covid, but that her pain has continued and new injury to tail bone has been giving her a lot of trouble over the past week or so.  Patient reports that family stressor not bad.  Content of Session:   Reviewed current symptoms and continue to work on therapeutic interventions around significant chronic pain symptoms and RSD.  The patient reports that she has had a lot of pain in both of her feet recently but is unsure whether some of it may be due to the development of peripheral neuropathy versus her RSD.  It was originally diagnosed as RSD.  The patient reports that an injury to her coccyx bone has been particularly problematic for her.  Effectiveness of Interventions: The patient was motivated and very engaged during the set therapy session today and has already been working on some of the interventions we have begun developing after our first visit.  The interaction/appointment was done via WebEx with a very good video and audio connection throughout the entire hour of the visit.  Target Goals:   Target goals include building better coping resources and strategies around chronic pain symptoms and significant depressive symptoms.  Goals Last Reviewed:   10/08/2018  Goals Addressed Today:    Goals addressed today had to do with issues associated with her chronic pain and coping with new acute pains that she has been having.  Impression/Diagnosis:   Nicolena Schurman is a 58 year old female referred by [name], MD for therapeutic interventions as part of a multidisciplinary approach to treating the patient's severe chronic pain.  The patient expressed desire to begin being followed by Dr. Holley Raring through his interventional pain clinic and pain management program.  The patient has a long history of multiple spinal surgeries through the years dating back to 59.  The patient also has a long history of  treatment of her severe and chronic pain symptoms with opioid-based medications along with other medication interventions.  The patient also has a history of spinal cord stimulator placement with revisions and ultimately removed due to infection.  The patient was referred as part of this multidisciplinary approach for treating her chronic longstanding pain.  The patient describes significant severe chronic pain issues as well as issues of depression anxiety that did not exist prior to 1991.  The patient reports that significant loss of function, chronic pain, RSD developing and numerous neurological issues resulting from her cervical and spinal issues are quite problematic for her.    Ilean Skill, Psy.D. Clinical Psychologist Neuropsychologist

## 2018-10-12 ENCOUNTER — Ambulatory Visit: Payer: Medicaid Other | Admitting: Urology

## 2018-10-12 ENCOUNTER — Encounter: Payer: Self-pay | Admitting: Urology

## 2018-10-19 ENCOUNTER — Encounter: Payer: Self-pay | Admitting: Student in an Organized Health Care Education/Training Program

## 2018-10-20 ENCOUNTER — Encounter: Payer: Self-pay | Admitting: Student in an Organized Health Care Education/Training Program

## 2018-10-20 ENCOUNTER — Ambulatory Visit
Payer: Medicaid Other | Attending: Student in an Organized Health Care Education/Training Program | Admitting: Student in an Organized Health Care Education/Training Program

## 2018-10-20 ENCOUNTER — Other Ambulatory Visit: Payer: Self-pay

## 2018-10-20 DIAGNOSIS — M47816 Spondylosis without myelopathy or radiculopathy, lumbar region: Secondary | ICD-10-CM

## 2018-10-20 DIAGNOSIS — G894 Chronic pain syndrome: Secondary | ICD-10-CM | POA: Diagnosis not present

## 2018-10-20 DIAGNOSIS — T85192S Other mechanical complication of implanted electronic neurostimulator (electrode) of spinal cord, sequela: Secondary | ICD-10-CM

## 2018-10-20 DIAGNOSIS — M5136 Other intervertebral disc degeneration, lumbar region: Secondary | ICD-10-CM

## 2018-10-20 DIAGNOSIS — M51369 Other intervertebral disc degeneration, lumbar region without mention of lumbar back pain or lower extremity pain: Secondary | ICD-10-CM

## 2018-10-20 MED ORDER — MORPHINE SULFATE ER 60 MG PO TBCR: 60.0000 mg | EXTENDED_RELEASE_TABLET | Freq: Two times a day (BID) | ORAL | 0 refills | Status: AC

## 2018-10-20 MED ORDER — MORPHINE SULFATE ER 60 MG PO TBCR: 60.0000 mg | EXTENDED_RELEASE_TABLET | Freq: Two times a day (BID) | ORAL | 0 refills | Status: DC

## 2018-10-20 NOTE — Progress Notes (Signed)
Pain Management Virtual Encounter Note - Virtual Visit via Telephone Telehealth (real-time audio visits between healthcare provider and patient).   Patient's Phone No. & Preferred Pharmacy:  [phone] (home); [phone] (mobile); (Preferred) [phone] [email or web address]  [id number] [name], [facility] [place] [address] [place] [address] Phone: [phone] Fax: [phone]    Pre-screening note:  Our staff contacted Lindsay Austin and offered her an "in person", "face-to-face" appointment versus a telephone encounter. She indicated preferring the telephone encounter, at this time.   Reason for Virtual Visit: COVID-19*  Social distancing based on CDC and AMA recommendations.   I contacted Lindsay Austin on 10/20/2018 via telephone.      I clearly identified myself as [name], MD. I verified that I was speaking with the correct person using two identifiers (Name: Lindsay Austin, and date of birth: [birth year]).  Advanced Informed Consent I sought verbal advanced consent from Lindsay Austin for virtual visit interactions. I informed Lindsay Austin of possible security and privacy concerns, risks, and limitations associated with providing "not-in-person" medical evaluation and management services. I also informed Lindsay Austin of the availability of "in-person" appointments. Finally, I informed her that there would be a charge for the virtual visit and that she could be  personally, fully or partially, financially responsible for it. Lindsay Austin expressed understanding and agreed to proceed.   Historic Elements   Lindsay Austin is a 58 y.o. year old, female patient evaluated today after her last encounter by our practice on 10/02/2018. Lindsay Austin  has a past medical history of Anxiety, Arthritis, Bladder cancer ([place]), Cancer of kidney ([place]), DDD (degenerative disc disease), cervical, Depression, Failed back syndrome, Fibromyalgia, GERD (gastroesophageal reflux disease), Heart murmur,  Hyperlipemia, Hypertension, IBS (irritable bowel syndrome), Kidney failure, Renal insufficiency, RSD (reflex sympathetic dystrophy), Seizure ([place]), Stomach ulcer, and Trigeminal neuralgia. She also  has a past surgical history that includes Transurethral resection of bladder tumor with gyrus (turbt-gyrus) (2012, 2013) and Nephroureterectomy (2011). Lindsay Austin has a current medication list which includes the following prescription(s): bupropion, dicyclomine, escitalopram, meclizine, methocarbamol, morphine, morphine, ondansetron, pregabalin, sucralfate, trazodone, triamcinolone cream, and topiramate. She  reports that she has never smoked. She has never used smokeless tobacco. She reports that she does not drink alcohol or use drugs. Lindsay Austin is allergic to methadone; other; and tape.   HPI  Today, she is being contacted for medication management and post-procedure evaluation.  Evaluation of last interventional procedure  09/30/2018 Procedure:  Bilateral C4, C5, C6, & C7  Pre-procedure pain score:  5/10 Post-procedure pain score: 3/10         Influential Factors: Intra-procedural challenges: None observed.         Reported side-effects: None.        Post-procedural adverse reactions or complications: None reported         Sedation: Please see nurses note for DOS. When no sedatives are used, the analgesic levels obtained are directly associated to the effectiveness of the local anesthetics. However, when sedation is provided, the level of analgesia obtained during the initial 1 hour following the intervention, is believed to be the result of a combination of factors. These factors may include, but are not limited to: 1. The effectiveness of the local anesthetics used. 2. The effects of the analgesic(s) and/or anxiolytic(s) used. 3. The degree of discomfort experienced by the patient at the time of the procedure. 4. The patients ability and reliability in recalling and recording the events. 5. The  presence  and influence of possible secondary gains and/or psychosocial factors. Reported result: Relief experienced during the 1st hour after the procedure:   80 %(Ultra-Short Term Relief)            Interpretative annotation: Clinically appropriate result. Analgesia during this period is likely to be Local Anesthetic and/or IV Sedative (Analgesic/Anxiolytic) related.          Effects of local anesthetic: The analgesic effects attained during this period are directly associated to the localized infiltration of local anesthetics and therefore cary significant diagnostic value as to the etiological location, or anatomical origin, of the pain. Expected duration of relief is directly dependent on the pharmacodynamics of the local anesthetic used. Long-acting (4-6 hours) anesthetics used.  Reported result: Relief during the next 4 to 6 hour after the procedure: 30%   (Short-Term Relief)            Interpretative annotation: Unexpected result. Pharmacodynamically inconsistent response.          Long-term benefit: Defined as the period of time past the expected duration of local anesthetics (1 hour for short-acting and 4-6 hours for long-acting). With the possible exception of prolonged sympathetic blockade from the local anesthetics, benefits during this period are typically attributed to, or associated with, other factors such as analgesic sensory neuropraxia, antiinflammatory effects, or beneficial biochemical changes provided by agents other than the local anesthetics.  Reported result: Extended relief following procedure:0%   (Long-Term Relief)            Interpretative annotation: Unexpected result. No benefit. Therapeutic failure. Pain appears to be refractory to this treatment modality.          Postprocedural eval status post bilateral C4, C5, C6, C7 cervical facet medial branch nerve block.  Of note, after the procedure patient had worsening headaches, migraines for 5 days after the procedure.  She  also had worsening neck pain.  She states that this is improved.  She continues to endorse paresthesias of her bilateral upper extremities.  Do not recommend repeat cervical facet medial branch nerve block.  We will focus next on lumbar spine as patient states that her left lower back is bothering her quite a bit.  Pharmacotherapy Assessment   09/30/2018  1   08/20/2018  Morphine Sulf Er 60 MG Tablet  60.00 30 Bi Lat   [id number]   Nor ([id number])   0  120.00 MME  Medicaid       Monitoring: Pharmacotherapy: No side-effects or adverse reactions reported. [place] PMP: PDMP reviewed during this encounter.       Compliance: No problems identified. Effectiveness: Clinically acceptable. Plan: Refer to "POC".  Pertinent Labs   SAFETY SCREENING Profile Lab Results  Component Value Date   SARSCOV2NAA NOT DETECTED 09/25/2018   COVIDSOURCE NASOPHARYNGEAL 09/25/2018   Renal Function Lab Results  Component Value Date   CREATININE 0.80 04/02/2018   Hepatic Function No results found for: AST, ALT, ALBUMIN UDS Summary  Date Value Ref Range Status  01/15/2018 FINAL  Final    Comment:    ==================================================================== TOXASSURE COMP DRUG ANALYSIS,UR ==================================================================== Test                             Result       Flag       Units Drug Present and Declared for Prescription Verification   Morphine                       >  13514       EXPECTED   ng/mg creat   Normorphine                    2788         EXPECTED   ng/mg creat    Potential sources of large amounts of morphine in the absence of    codeine include administration of morphine or use of heroin.    Normorphine is an expected metabolite of morphine.   Hydromorphone                  699          EXPECTED   ng/mg creat    Hydromorphone may be present as a metabolite of morphine;    concentrations of hydromorphone rarely exceed 5% of the morphine     concentration when this is the source of hydromorphone.   Citalopram                     PRESENT      EXPECTED   Desmethylcitalopram            PRESENT      EXPECTED    Desmethylcitalopram is an expected metabolite of citalopram or    the enantiomeric form, escitalopram.   Trazodone                      PRESENT      EXPECTED   1,3 chlorophenyl piperazine    PRESENT      EXPECTED    1,3-chlorophenyl piperazine is an expected metabolite of    trazodone. Drug Absent but Declared for Prescription Verification   Methocarbamol                  Not Detected UNEXPECTED   Bupropion                      Not Detected UNEXPECTED   Milnacipran                    Not Detected UNEXPECTED ==================================================================== Test                      Result    Flag   Units      Ref Range   Creatinine              74               mg/dL      >=20 ==================================================================== Declared Medications:  The flagging and interpretation on this report are based on the  following declared medications.  Unexpected results may arise from  inaccuracies in the declared medications.  **Note: The testing scope of this panel includes these medications:  Bupropion  Escitalopram  Methocarbamol  Milnacipran  Morphine (Morphine Sulfate)  Trazodone  **Note: The testing scope of this panel does not include following  reported medications:  Dicyclomine  Mirabegron  Ondansetron  Oxybutynin  Sucralfate ==================================================================== For clinical consultation, please call [phone]. ====================================================================    Note: Above Lab results reviewed.  Recent imaging  MR LUMBAR SPINE WO CONTRAST CLINICAL DATA:  Low back pain.  Multiple prior surgeries x7.  EXAM: MRI LUMBAR SPINE WITHOUT CONTRAST  TECHNIQUE: Multiplanar, multisequence MR imaging of the lumbar  spine was performed. No intravenous contrast was administered.  COMPARISON:  None.  FINDINGS: Segmentation:  Standard.  Alignment:  Physiologic.  Vertebrae:  No fracture, evidence of discitis, or aggressive bone lesion. T1 and T2 hyperintense L3 vertebral body bone lesion most consistent with a hemangioma.  Conus medullaris and cauda equina: Conus extends to the T12 level. Conus and cauda equina appear normal.  Paraspinal and other soft tissues: No acute paraspinal abnormality. Prior left nephrectomy.  Disc levels:  Disc spaces: Interbody fusion at L5-S1.  T12-L1: No significant disc bulge. No evidence of neural foraminal stenosis. No central canal stenosis.  L1-L2: No significant disc bulge. No evidence of neural foraminal stenosis. No central canal stenosis. Mild bilateral facet arthropathy.  L2-L3: No significant disc bulge. No evidence of neural foraminal stenosis. No central canal stenosis.  L3-L4: Mild broad-based disc bulge. No evidence of neural foraminal stenosis. No central canal stenosis.  L4-L5: Broad-based disc bulge. Severe bilateral facet arthropathy with ligamentum flavum infolding. Moderate spinal stenosis. Right lateral recess stenosis. No foraminal stenosis.  L5-S1: Interbody fusion. No evidence of neural foraminal stenosis. No central canal stenosis.  IMPRESSION: 1. At L4-5 there is a broad-based disc bulge. Severe bilateral facet arthropathy with ligamentum flavum infolding. Moderate spinal stenosis. Right lateral recess stenosis.  Electronically Signed   By: Kathreen Devoid   On: 06/09/2018 14:49  Assessment  The primary encounter diagnosis was Lumbar facet arthropathy. Diagnoses of Lumbar spondylosis, Lumbar degenerative disc disease, Chronic pain syndrome, and Failed spinal cord stimulator, sequela were also pertinent to this visit.  Patient is status post bilateral cervical facet medial branch nerve blocks at C4, C5, C6, C7 which she states  were not helpful for her.  Patient denies at least 50% pain relief for 5 days.  Do not recommend repeating.  Patient is experiencing worsening axial low back pain, left greater than right.  She states that it radiates into her lower buttocks and makes it difficult for her to walk.  Lumbar MRI above shows lumbar facet arthropathy at L3-L4 and L4-L5 above her L5-S1 fusion.  Lindsay Austin has a history of greater than 3 months of moderate to severe pain which is resulted in functional impairment.  The patient has tried various conservative therapeutic options such as NSAIDs, Tylenol, muscle relaxants, physical therapy which was inadequately effective.  Patient's pain is predominantly axial with radiographic findings suggestive of facet arthropathy.  Lumbar facet medial branch nerve blocks were discussed with the patient.  Risks and benefits were reviewed.  Patient would like to proceed with bilateral L3, L4, L5 medial branch nerve block.  Refill morphine as below.  Patient has seen Dr. Sima Matas with pain psychology to help with pain coping skills.  Encourage patient to continue multimodal analgesics including Robaxin-750 milligrams daily, Lyrica 50 mg 3 times daily along with Topamax 50 mg.   Plan of Care  I have discontinued Lindsay Austin mirabegron ER and sulfamethoxazole-trimethoprim. I am also having her start on morphine. Additionally, I am having her maintain her escitalopram, methocarbamol, ondansetron, traZODone, sucralfate, dicyclomine, buPROPion, meclizine, topiramate, triamcinolone cream, pregabalin, and morphine.  Pharmacotherapy (Medications Ordered): Meds ordered this encounter  Medications  . morphine (MS CONTIN) 60 MG 12 hr tablet    Sig: Take 1 tablet (60 mg total) by mouth every 12 (twelve) hours for 30 days. Must last 30 days.    Dispense:  60 tablet    Refill:  0    [place] STOP ACT - Not applicable. Fill one day early if pharmacy is closed on scheduled refill date.  . morphine (MS  CONTIN) 60 MG 12 hr tablet    Sig: Take  1 tablet (60 mg total) by mouth every 12 (twelve) hours for 30 days. Must last 30 days.    Dispense:  60 tablet    Refill:  0    [place] STOP ACT - Not applicable. Fill one day early if pharmacy is closed on scheduled refill date.   Orders:  Orders Placed This Encounter  Procedures  . LUMBAR FACET(MEDIAL BRANCH NERVE BLOCK) MBNB    Standing Status:   Future    Standing Expiration Date:   11/19/2018    Scheduling Instructions:     Side: Bilateral     Level: L3-4, L4-5,  Facets (L3, L4, L5 Medial Branch Nerves)     Sedation: w/ sedation     Timeframe: ASAA    Order Specific Question:   Where will this procedure be performed?    Answer:   [facility]   Follow-up plan:   Return for Procedure bilateral lumbar facet medial branch nerve block with sedation.    Recent Visits Date Type Provider Dept  09/30/18 Procedure visit [name], MD [facility]  08/20/18 Office Visit [name], MD [facility]  07/23/18 Office Visit [name], MD [facility]  Showing recent visits within past 90 days and meeting all other requirements   Today's Visits Date Type Provider Dept  10/20/18 Office Visit [name], MD [facility]  Showing today's visits and meeting all other requirements   Future Appointments No visits were found meeting these conditions.  Showing future appointments within next 90 days and meeting all other requirements   I discussed the assessment and treatment plan with the patient. The patient was provided an opportunity to ask questions and all were answered. The patient agreed with the plan and demonstrated an understanding of the instructions.  Patient advised to call back or seek an in-person evaluation if the symptoms or condition worsens.  Total duration of non-face-to-face encounter: 25 minutes.  Note by: [name], MD Date: 10/20/2018; Time: 2:43 PM  Note: This  dictation was prepared with Dragon dictation. Any transcriptional errors that may result from this process are unintentional.  Disclaimer:  * Given the special circumstances of the COVID-19 pandemic, the federal government has announced that the Office for Civil Rights (OCR) will exercise its enforcement discretion and will not impose penalties on physicians using telehealth in the event of noncompliance with regulatory requirements under the [place] and [place] (HIPAA) in connection with the good faith provision of telehealth during the SHFWY-63 national public health emergency. ([place])

## 2018-10-29 ENCOUNTER — Encounter: Payer: Medicaid Other | Attending: Psychology | Admitting: Psychology

## 2018-10-29 ENCOUNTER — Other Ambulatory Visit: Payer: Self-pay

## 2018-10-29 DIAGNOSIS — G9050 Complex regional pain syndrome I, unspecified: Secondary | ICD-10-CM | POA: Insufficient documentation

## 2018-10-29 DIAGNOSIS — T85192S Other mechanical complication of implanted electronic neurostimulator (electrode) of spinal cord, sequela: Secondary | ICD-10-CM | POA: Insufficient documentation

## 2018-10-29 DIAGNOSIS — Z9889 Other specified postprocedural states: Secondary | ICD-10-CM | POA: Insufficient documentation

## 2018-10-29 DIAGNOSIS — Q761 Klippel-Feil syndrome: Secondary | ICD-10-CM | POA: Insufficient documentation

## 2018-10-29 DIAGNOSIS — F331 Major depressive disorder, recurrent, moderate: Secondary | ICD-10-CM | POA: Insufficient documentation

## 2018-10-30 ENCOUNTER — Other Ambulatory Visit: Payer: Self-pay

## 2018-10-30 ENCOUNTER — Other Ambulatory Visit
Admission: RE | Admit: 2018-10-30 | Discharge: 2018-10-30 | Disposition: A | Discharge status: Home / Self Care | Payer: Medicaid Other | Source: Ambulatory Visit | Attending: Student in an Organized Health Care Education/Training Program | Admitting: Student in an Organized Health Care Education/Training Program

## 2018-10-30 DIAGNOSIS — Z01812 Encounter for preprocedural laboratory examination: Secondary | ICD-10-CM | POA: Diagnosis present

## 2018-10-30 DIAGNOSIS — Z1159 Encounter for screening for other viral diseases: Secondary | ICD-10-CM | POA: Insufficient documentation

## 2018-10-31 LAB — SARS CORONAVIRUS 2 (TAT 6-24 HRS): SARS Coronavirus 2: NEGATIVE

## 2018-11-04 ENCOUNTER — Encounter: Payer: Self-pay | Admitting: Student in an Organized Health Care Education/Training Program

## 2018-11-04 ENCOUNTER — Ambulatory Visit (HOSPITAL_BASED_OUTPATIENT_CLINIC_OR_DEPARTMENT_OTHER): Payer: Medicaid Other | Admitting: Student in an Organized Health Care Education/Training Program

## 2018-11-04 ENCOUNTER — Other Ambulatory Visit: Payer: Self-pay

## 2018-11-04 ENCOUNTER — Ambulatory Visit
Admission: RE | Admit: 2018-11-04 | Discharge: 2018-11-04 | Disposition: A | Discharge status: Home / Self Care | Payer: Medicaid Other | Source: Ambulatory Visit | Attending: Student in an Organized Health Care Education/Training Program | Admitting: Student in an Organized Health Care Education/Training Program

## 2018-11-04 VITALS — BP 124/78 | HR 64 | Temp 98.2°F | Resp 15 | Ht 64.0 in | Wt 180.0 lb

## 2018-11-04 DIAGNOSIS — M21611 Bunion of right foot: Secondary | ICD-10-CM

## 2018-11-04 DIAGNOSIS — M47816 Spondylosis without myelopathy or radiculopathy, lumbar region: Secondary | ICD-10-CM | POA: Insufficient documentation

## 2018-11-04 MED ORDER — ROPIVACAINE HCL 2 MG/ML IJ SOLN
1.0000 mL | Freq: Once | INTRAMUSCULAR | Status: AC
  Administered 2018-11-04: 1 mL via EPIDURAL
  Filled 2018-11-04: qty 10

## 2018-11-04 MED ORDER — DEXAMETHASONE SODIUM PHOSPHATE 10 MG/ML IJ SOLN
10.0000 mg | Freq: Once | INTRAMUSCULAR | Status: AC
  Administered 2018-11-04: 10 mg
  Filled 2018-11-04: qty 1

## 2018-11-04 MED ORDER — ROPIVACAINE HCL 2 MG/ML IJ SOLN
INTRAMUSCULAR | Status: AC
  Filled 2018-11-04: qty 10

## 2018-11-04 MED ORDER — LIDOCAINE HCL 2 % IJ SOLN
20.0000 mL | Freq: Once | INTRAMUSCULAR | Status: AC
  Administered 2018-11-04: 400 mg

## 2018-11-04 MED ORDER — ROPIVACAINE HCL 2 MG/ML IJ SOLN
2.0000 mL | Freq: Once | INTRAMUSCULAR | Status: AC
  Administered 2018-11-04: 2 mL via EPIDURAL
  Filled 2018-11-04: qty 10

## 2018-11-04 MED ORDER — DIAZEPAM 5 MG PO TABS
ORAL_TABLET | ORAL | Status: AC
  Filled 2018-11-04: qty 1

## 2018-11-04 NOTE — Progress Notes (Signed)
Safety precautions to be maintained throughout the outpatient stay will include: orient to surroundings, keep bed in low position, maintain call bell within reach at all times, provide assistance with transfer out of bed and ambulation.  

## 2018-11-04 NOTE — Progress Notes (Signed)
Patient's Name: Lindsay Austin  MRN: [id number]  Referring Provider: [name], [place]  DOB: [birth year]  PCP: [name], [place]  DOS: 11/04/2018  Note by: [name], MD  Service setting: Ambulatory outpatient  Specialty: Interventional Pain Management  Patient type: Established  Location: [facility] (AMB) Pain Management Facility  Visit type: Interventional Procedure   Primary Reason for Visit: Interventional Pain Management Treatment. CC: Back Pain (lower)  Procedure:          Anesthesia, Analgesia, Anxiolysis:  Type: Lumbar Facet, Medial Branch Block(s)          Primary Purpose: Diagnostic Region: Posterolateral Lumbosacral Spine Level:  L3, L4, L5, Medial Branch Level(s). Injecting these levels blocks the L3-4, L4-5, and L5-S1 lumbar facet joints. Laterality: Bilateral  Type: Moderate (Conscious) Sedation combined with Local Anesthesia, received 5 mg PO Valium  Local Anesthetic: Lidocaine 1-2%  Position: Prone   Indications: 1. Lumbar facet arthropathy   2. Bunion of great toe of right foot    Pain Score: Pre-procedure: 5 /10 Post-procedure: 2 /10  Pre-op Assessment:  Lindsay Austin is a 58 y.o. (year old), female patient, seen today for interventional treatment. 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 Lindsay Austin has a current medication list which includes the following prescription(s): bupropion, dicyclomine, escitalopram, meclizine, methocarbamol, morphine, morphine, myrbetriq, ondansetron, pregabalin, sucralfate, topiramate, trazodone, and triamcinolone cream. Her primarily concern today is the Back Pain (lower)  Initial Vital Signs:  Pulse/HCG Rate: 64ECG Heart Rate: 62 Temp: 98.2 F (36.8 C) Resp: 16 BP: 118/70 SpO2: 100 %  BMI: Estimated body mass index is 30.9 kg/m as calculated from the following:   Height as of this encounter: 5\' 4"  (1.626 m).   Weight as of  this encounter: 180 lb (81.6 kg).  Risk Assessment: Allergies: Reviewed. She is allergic to methadone; other; and tape.  Allergy Precautions: None required Coagulopathies: Reviewed. None identified.  Blood-thinner therapy: None at this time Active Infection(s): Reviewed. None identified. Lindsay Austin is afebrile  Site Confirmation: Lindsay Austin was asked to confirm the procedure and laterality before marking the site Procedure checklist: Completed Consent: Before the procedure and under the influence of no sedative(s), amnesic(s), or anxiolytics, the patient was informed of the treatment options, risks and possible complications. To fulfill our ethical and legal obligations, as recommended by the American Medical Association's Code of Ethics, I have informed the patient of my clinical impression; the nature and purpose of the treatment or procedure; the risks, benefits, and possible complications of the intervention; the alternatives, including doing nothing; the risk(s) and benefit(s) of the alternative treatment(s) or procedure(s); and the risk(s) and benefit(s) of doing nothing. The patient was provided information about the general risks and possible complications associated with the procedure. These may include, but are not limited to: failure to achieve desired goals, infection, bleeding, organ or nerve damage, allergic reactions, paralysis, and death. In addition, the patient was informed of those risks and complications associated to Spine-related procedures, such as failure to decrease pain; infection (i.e.: Meningitis, epidural or intraspinal abscess); bleeding (i.e.: epidural hematoma, subarachnoid hemorrhage, or any other type of intraspinal or peri-dural bleeding); organ or nerve damage (i.e.: Any type of peripheral nerve, nerve root, or spinal cord injury) with subsequent damage to sensory, motor, and/or autonomic systems, resulting in permanent pain, numbness, and/or weakness of one or several  areas of the body; allergic reactions; (i.e.: anaphylactic reaction); and/or death. Furthermore,  the patient was informed of those risks and complications associated with the medications. These include, but are not limited to: allergic reactions (i.e.: anaphylactic or anaphylactoid reaction(s)); adrenal axis suppression; blood sugar elevation that in diabetics may result in ketoacidosis or comma; water retention that in patients with history of congestive heart failure may result in shortness of breath, pulmonary edema, and decompensation with resultant heart failure; weight gain; swelling or edema; medication-induced neural toxicity; particulate matter embolism and blood vessel occlusion with resultant organ, and/or nervous system infarction; and/or aseptic necrosis of one or more joints. Finally, the patient was informed that Medicine is not an exact science; therefore, there is also the possibility of unforeseen or unpredictable risks and/or possible complications that may result in a catastrophic outcome. The patient indicated having understood very clearly. We have given the patient no guarantees and we have made no promises. Enough time was given to the patient to ask questions, all of which were answered to the patient's satisfaction. Lindsay Austin has indicated that she wanted to continue with the procedure. Attestation: I, the ordering provider, attest that I have discussed with the patient the benefits, risks, side-effects, alternatives, likelihood of achieving goals, and potential problems during recovery for the procedure that I have provided informed consent. Date   Time: 11/04/2018  9:39 AM  Pre-Procedure Preparation:  Monitoring: As per clinic protocol. Respiration, ETCO2, SpO2, BP, heart rate and rhythm monitor placed and checked for adequate function Safety Precautions: Patient was assessed for positional comfort and pressure points before starting the procedure. Time-out: I initiated and  conducted the "Time-out" before starting the procedure, as per protocol. The patient was asked to participate by confirming the accuracy of the "Time Out" information. Verification of the correct person, site, and procedure were performed and confirmed by me, the nursing staff, and the patient. "Time-out" conducted as per Joint Commission's Universal Protocol (UP.01.01.01). Time: 1030  Description of Procedure:          Laterality: Bilateral. The procedure was performed in identical fashion on both sides. Levels:  L3, L4, L5, Medial Branch Level(s) Area Prepped: Posterior Lumbosacral Region Prepping solution: DuraPrep (Iodine Povacrylex [0.7% available iodine] and Isopropyl Alcohol, 74% w/w) Safety Precautions: Aspiration looking for blood return was conducted prior to all injections. At no point did we inject any substances, as a needle was being advanced. Before injecting, the patient was told to immediately notify me if she was experiencing any new onset of "ringing in the ears, or metallic taste in the mouth". No attempts were made at seeking any paresthesias. Safe injection practices and needle disposal techniques used. Medications properly checked for expiration dates. SDV (single dose vial) medications used. After the completion of the procedure, all disposable equipment used was discarded in the proper designated medical waste containers. Local Anesthesia: Protocol guidelines were followed. The patient was positioned over the fluoroscopy table. The area was prepped in the usual manner. The time-out was completed. The target area was identified using fluoroscopy. A 12-in long, straight, sterile hemostat was used with fluoroscopic guidance to locate the targets for each level blocked. Once located, the skin was marked with an approved surgical skin marker. Once all sites were marked, the skin (epidermis, dermis, and hypodermis), as well as deeper tissues (fat, connective tissue and muscle) were  infiltrated with a small amount of a short-acting local anesthetic, loaded on a 10cc syringe with a 25G, 1.5-in  Needle. An appropriate amount of time was allowed for local anesthetics to take effect before  proceeding to the next step. Local Anesthetic: Lidocaine 2.0% The unused portion of the local anesthetic was discarded in the proper designated containers. Technical explanation of process:  L3 Medial Branch Nerve Block (MBB): The target area for the L3 medial branch is at the junction of the postero-lateral aspect of the superior articular process and the superior, posterior, and medial edge of the transverse process of L4. Under fluoroscopic guidance, a Quincke needle was inserted until contact was made with os over the superior postero-lateral aspect of the pedicular shadow (target area). After negative aspiration for blood, 28mL of the nerve block solution was injected without difficulty or complication. The needle was removed intact. L4 Medial Branch Nerve Block (MBB): The target area for the L4 medial branch is at the junction of the postero-lateral aspect of the superior articular process and the superior, posterior, and medial edge of the transverse process of L5. Under fluoroscopic guidance, a Quincke needle was inserted until contact was made with os over the superior postero-lateral aspect of the pedicular shadow (target area). After negative aspiration for blood,1 mL of the nerve block solution was injected without difficulty or complication. The needle was removed intact. L5 Medial Branch Nerve Block (MBB): The target area for the L5 medial branch is at the junction of the postero-lateral aspect of the superior articular process and the superior, posterior, and medial edge of the sacral ala. Under fluoroscopic guidance, a Quincke needle was inserted until contact was made with os over the superior postero-lateral aspect of the pedicular shadow (target area). After negative aspiration for blood, 1  mL of the nerve block solution was injected without difficulty or complication. The needle was removed intact.  Nerve block solution: 10 cc solution made of 8 cc of 0.2% ropivacaine, 2 cc of Decadron 10 mg/cc.  1 to 1.5 cc injected at each level above bilaterally.  The unused portion of the solution was discarded in the proper designated containers. Procedural Needles: 25-gauge, 3.5-inch, Quincke needles used for all levels.   Once the entire procedure was completed, the treated area was cleaned, making sure to leave some of the prepping solution back to take advantage of its long term bactericidal properties.   Illustration of the posterior view of the lumbar spine and the posterior neural structures. Laminae of L2 through S1 are labeled. DPRL5, dorsal primary ramus of L5; DPRS1, dorsal primary ramus of S1; DPR3, dorsal primary ramus of L3; FJ, facet (zygapophyseal) joint L3-L4; I, inferior articular process of L4; LB1, lateral branch of dorsal primary ramus of L1; IAB, inferior articular branches from L3 medial branch (supplies L4-L5 facet joint); IBP, intermediate branch plexus; MB3, medial branch of dorsal primary ramus of L3; NR3, third lumbar nerve root; S, superior articular process of L5; SAB, superior articular branches from L4 (supplies L4-5 facet joint also); TP3, transverse process of L3.  Vitals:   11/04/18 1046 11/04/18 1050 11/04/18 1055 11/04/18 1100  BP: 122/87 127/77 124/80 124/78  Pulse:      Resp: 11 15 14 15   Temp:      SpO2: 98% 98% 98% 98%  Weight:      Height:         Start Time: 1030 hrs. End Time: 1502 hrs.  Imaging Guidance (Spinal):          Type of Imaging Technique: Fluoroscopy Guidance (Spinal) Indication(s): Assistance in needle guidance and placement for procedures requiring needle placement in or near specific anatomical locations not easily accessible without such assistance. Exposure  Time: Please see nurses notes. Contrast: None used. Fluoroscopic  Guidance: I was personally present during the use of fluoroscopy. "Tunnel Vision Technique" used to obtain the best possible view of the target area. Parallax error corrected before commencing the procedure. "Direction-depth-direction" technique used to introduce the needle under continuous pulsed fluoroscopy. Once target was reached, antero-posterior, oblique, and lateral fluoroscopic projection used confirm needle placement in all planes. Images permanently stored in EMR. Interpretation: No contrast injected. I personally interpreted the imaging intraoperatively. Adequate needle placement confirmed in multiple planes. Permanent images saved into the patient's record.  Antibiotic Prophylaxis:   Anti-infectives (From admission, onward)   None     Indication(s): None identified  Post-operative Assessment:  Post-procedure Vital Signs:  Pulse/HCG Rate: 64(!) 57 Temp: 98.2 F (36.8 C) Resp: 15 BP: 124/78 SpO2: 98 %  EBL: None  Complications: No immediate post-treatment complications observed by team, or reported by patient.  Note: The patient tolerated the entire procedure well. A repeat set of vitals were taken after the procedure and the patient was kept under observation following institutional policy, for this type of procedure. Post-procedural neurological assessment was performed, showing return to baseline, prior to discharge. The patient was provided with post-procedure discharge instructions, including a section on how to identify potential problems. Should any problems arise concerning this procedure, the patient was given instructions to immediately contact us, at any time, without hesitation. In any case, we plan to contact the patient by telephone for a follow-up status report regarding this interventional procedure.  Comments:  No additional relevant information.  Plan of Care  Orders:  Orders Placed This Encounter  Procedures   DG PAIN CL[facility] C-ARM 1-60 MIN NO REPORT     Intraoperative interpretation by procedural physician at [place].    Standing Status:   Standing    Number of Occurrences:   1    Order Specific Question:   Reason for exam:    Answer:   Assistance in needle guidance and placement for procedures requiring needle placement in or near specific anatomical locations not easily accessible without such assistance.   Ambulatory referral to Podiatry    Referral Priority:   Routine    Referral Type:   Consultation    Referral Reason:   Specialty Services Required    Referred to Provider:   Edrick Kins, DPM    Requested Specialty:   Podiatry    Number of Visits Requested:   1   Chronic Opioid Analgesic:   MS Contin 60 mg BID (pt transferred care from [place], was previously on 200 mg daily)}   Medications ordered for procedure: Meds ordered this encounter  Medications   lidocaine (XYLOCAINE) 2 % (with pres) injection 400 mg   ropivacaine (PF) 2 mg/mL (0.2%) (NAROPIN) injection 1 mL   dexamethasone (DECADRON) injection 10 mg   ropivacaine (PF) 2 mg/mL (0.2%) (NAROPIN) injection 2 mL   dexamethasone (DECADRON) injection 10 mg   Medications administered: We administered lidocaine, ropivacaine (PF) 2 mg/mL (0.2%), dexamethasone, ropivacaine (PF) 2 mg/mL (0.2%), and dexamethasone.  See the medical record for exact dosing, route, and time of administration.  Follow-up plan:   Recent Visits Date Type Provider Dept  10/20/18 Office Visit [name], MD [facility]  09/30/18 Procedure visit [name], MD [facility]  08/20/18 Office Visit [name], MD [facility]  Showing recent visits within past 90 days and meeting all other requirements   Today's Visits Date Type Provider Dept  11/04/18 Procedure visit [name], MD [facility]  Showing today's visits and meeting all other requirements   Future Appointments Date Type Provider Dept  12/17/18 Appointment [name], MD [facility]  Showing future appointments within next 90 days and meeting all other requirements   Disposition: Discharge home  Discharge Date & Time: 11/04/2018; 1101 hrs.   Primary Care Physician: [name], [place] Location: [facility] Note by: [name], MD Date: 11/04/2018; Time: 3:06 PM  Disclaimer:  Medicine is not an exact science. The only guarantee in medicine is that nothing is guaranteed. It is important to note that the decision to proceed with this intervention was based on the information collected from the patient. The Data and conclusions were drawn from the patient's questionnaire, the interview, and the physical examination. Because the information was provided in large part by the patient, it cannot be guaranteed that it has not been purposely or unconsciously manipulated. Every effort has been made to obtain as much relevant data as possible for this evaluation. It is important to note that the conclusions that lead to this procedure are derived in large part from the available data. Always take into account that the treatment will also be dependent on availability of resources and existing treatment guidelines, considered by other Pain Management Practitioners as being common knowledge and practice, at the time of the intervention. For Medico-Legal purposes, it is also important to point out that variation in procedural techniques and pharmacological choices are the acceptable norm. The indications, contraindications, technique, and results of the above procedure should only be interpreted and judged by a Board-Certified Interventional Pain Specialist with extensive familiarity and expertise in the same exact procedure and technique.

## 2018-11-05 ENCOUNTER — Telehealth: Payer: Self-pay

## 2018-11-05 NOTE — Telephone Encounter (Signed)
Patient was called and no problems was reported. 

## 2018-11-10 ENCOUNTER — Telehealth: Payer: Self-pay | Admitting: *Deleted

## 2018-11-10 NOTE — Telephone Encounter (Signed)
Attempted to call patient.  Left message for her to call our office.

## 2018-11-10 NOTE — Telephone Encounter (Signed)
Experiencing new pain in feet. Also a "popping" sensation in muscles throughout the body. Dr. Holley Raring informed of these symptoms. Advised that symptoms likely not related to facet block done on 11-10-18. Tell patient to monitor.

## 2018-11-12 ENCOUNTER — Encounter: Payer: Medicaid Other | Admitting: Psychology

## 2018-11-17 ENCOUNTER — Encounter: Payer: Self-pay | Admitting: Podiatry

## 2018-11-17 ENCOUNTER — Ambulatory Visit (INDEPENDENT_AMBULATORY_CARE_PROVIDER_SITE_OTHER): Payer: Medicaid Other

## 2018-11-17 ENCOUNTER — Ambulatory Visit: Payer: Medicaid Other | Admitting: Podiatry

## 2018-11-17 ENCOUNTER — Other Ambulatory Visit: Payer: Self-pay

## 2018-11-17 VITALS — BP 126/79 | HR 64 | Temp 97.9°F

## 2018-11-17 DIAGNOSIS — M2011 Hallux valgus (acquired), right foot: Secondary | ICD-10-CM | POA: Diagnosis not present

## 2018-11-17 DIAGNOSIS — M7751 Other enthesopathy of right foot: Secondary | ICD-10-CM

## 2018-11-17 DIAGNOSIS — M21621 Bunionette of right foot: Secondary | ICD-10-CM

## 2018-11-17 MED ORDER — METHYLPREDNISOLONE 4 MG PO TBPK: ORAL_TABLET | ORAL | 0 refills | Status: DC

## 2018-11-19 NOTE — Progress Notes (Signed)
   Subjective: 59 year old female presenting today as a new patient with a chief complaint of a painful bunion of the right foot that became symptomatic a few months ago. She states the right hallux is starting to curve over the second toe. She also reports burning, sharp pain to the plantar aspect of the right foot. She is currently in pain management. Walking and wearing shoes increases her pain. Patient is here for further evaluation and treatment.   Past Medical History:  Diagnosis Date  . Anxiety   . Arthritis   . Bladder cancer ([place])   . Cancer of kidney ([place])   . DDD (degenerative disc disease), cervical   . Depression   . Failed back syndrome   . Fibromyalgia   . GERD (gastroesophageal reflux disease)   . Heart murmur   . Hyperlipemia   . Hypertension   . IBS (irritable bowel syndrome)   . Kidney failure   . Renal insufficiency   . RSD (reflex sympathetic dystrophy)   . Seizure ([place])   . Stomach ulcer   . Trigeminal neuralgia      Objective: Physical Exam General: The patient is alert and oriented x3 in no acute distress.  Dermatology: Skin is cool, dry and supple bilateral lower extremities. Negative for open lesions or macerations.  Vascular: Palpable pedal pulses bilaterally. No edema or erythema noted. Capillary refill within normal limits.  Neurological: Epicritic and protective threshold grossly intact bilaterally.   Musculoskeletal Exam: Clinical evidence of Tailor's bunion deformity noted to the respective foot. There is a moderate pain on palpation range of motion of the fifth MPJ.  Clinical evidence of bunion deformity noted to the respective foot. There is moderate pain on palpation range of motion of the first MPJ. Lateral deviation of the hallux noted consistent with hallux abductovalgus.  Radiographic Exam: Increased intermetatarsal angle to the fourth interspace of the respective foot. Prominent fifth metatarsal head. Joint spaces preserved.  Increased  intermetatarsal angle greater than 15 with a hallux abductus angle greater than 30 noted on AP view. Moderate degenerative changes noted within the first MPJ.  Assessment: 1. Tailor's bunion deformity right 2. HAV w/ bunion deformity right  3. 3rd MPJ capsulitis right 4. 5th MPJ capsulitis right  5. H/o CRPS   Plan of Care:  1. Patient was evaluated. X-Rays reviewed.  2. Declined injections today.  3. Prescription for Medrol Dose Pak provided to patient. 4. Recommended good shoe gear.  5. Continue pain management with Dr. Zollie Scale, pain management.  6. Return to clinic in 6 weeks.    Edrick Kins, DPM [facility]  Dr. Edrick Kins, [address]                                        [place], [place] [address]                Office [phone]  Fax [phone]

## 2018-11-23 ENCOUNTER — Ambulatory Visit (INDEPENDENT_AMBULATORY_CARE_PROVIDER_SITE_OTHER): Payer: Medicaid Other | Admitting: Physician Assistant

## 2018-11-23 ENCOUNTER — Other Ambulatory Visit: Payer: Self-pay | Admitting: Physician Assistant

## 2018-11-23 ENCOUNTER — Other Ambulatory Visit: Payer: Self-pay

## 2018-11-23 ENCOUNTER — Encounter: Payer: Self-pay | Admitting: Physician Assistant

## 2018-11-23 DIAGNOSIS — R300 Dysuria: Secondary | ICD-10-CM | POA: Diagnosis not present

## 2018-11-23 LAB — URINALYSIS, COMPLETE
Bilirubin, UA: NEGATIVE
Glucose, UA: NEGATIVE
Ketones, UA: NEGATIVE
Nitrite, UA: NEGATIVE
Protein,UA: NEGATIVE
RBC, UA: NEGATIVE
Specific Gravity, UA: 1.015 (ref 1.005–1.030)
Urobilinogen, Ur: 0.2 mg/dL (ref 0.2–1.0)
pH, UA: 5 (ref 5.0–7.5)

## 2018-11-23 LAB — MICROSCOPIC EXAMINATION: RBC, Urine: NONE SEEN /hpf (ref 0–2)

## 2018-11-23 MED ORDER — NITROFURANTOIN MONOHYD MACRO 100 MG PO CAPS: 100.0000 mg | ORAL_CAPSULE | Freq: Two times a day (BID) | ORAL | 0 refills | Status: AC

## 2018-11-23 NOTE — Progress Notes (Signed)
11/23/2018 3:57 PM   Lindsay Austin December 10, 1960 [id number]  CC: Dysuria, urinary frequency  HPI: Lindsay Austin is a 58 y.o. female who presents today for evaluation of possible UTI. She is an established [facility] patient with PMH urothelial carcinoma s/p left nephroureterectomy and BCG, cystocele, rectocele, and OAB with mixed incontinence who last saw Lindsay Austin via telehealth on 09/01/2018 for follow-up of her OAB after a failed 8-week trial of Vesicare.  Patient reports dysuria and urinary frequency that started approximately 2 weeks ago. She describes her urine is foamy and smelly. While she reports some nausea at baseline, she denies vomiting, fevers, chills, flank pain, and gross hematuria. She has not taken anything at home to treat her symptoms.  Patient does have a history of UTI, most recently on 09/29/2018 and treated with Bactrim. She reports complete resolution of her UTI symptoms after completing this course of antibiotics.  In-office UA today positive for leukocyte esterase; urine microscopy with 11-30 WBCs/HPF and few bacteria. Urine culture pending.  She has a follow-up appointment scheduled with Dr. Matilde Sprang on 12/14/2018.   PMH: Past Medical History:  Diagnosis Date  . Anxiety   . Arthritis   . Bladder cancer ([place])   . Cancer of kidney ([place])   . DDD (degenerative disc disease), cervical   . Depression   . Failed back syndrome   . Fibromyalgia   . GERD (gastroesophageal reflux disease)   . Heart murmur   . Hyperlipemia   . Hypertension   . IBS (irritable bowel syndrome)   . Kidney failure   . Renal insufficiency   . RSD (reflex sympathetic dystrophy)   . Seizure ([place])   . Stomach ulcer   . Trigeminal neuralgia     Surgical History: Past Surgical History:  Procedure Laterality Date  . NEPHROURETERECTOMY  2011  . TRANSURETHRAL RESECTION OF BLADDER TUMOR WITH GYRUS (TURBT-GYRUS)  2012, 2013    Home Medications:  Allergies as of 11/23/2018      Reactions   Methadone Palpitations   Other Swelling, Other (See Comments)   Tricyclic antidepressants Unable to void   Tape Other (See Comments)   blisters      Medication List       Accurate as of November 23, 2018  3:57 PM. If you have any questions, ask your nurse or doctor.        STOP taking these medications   topiramate 50 MG tablet Commonly known as: TOPAMAX Stopped by: Debroah Loop, PA-C     TAKE these medications   dicyclomine 20 MG tablet Commonly known as: BENTYL Take 20 mg by mouth 2 (two) times a day.   escitalopram 10 MG tablet Commonly known as: LEXAPRO TAKE 1 TABLET BY MOUTH EVERY DAY FOR MOOD   meclizine 25 MG tablet Commonly known as: ANTIVERT Take 25 mg by mouth 3 (three) times daily as needed for dizziness.   methocarbamol 750 MG tablet Commonly known as: ROBAXIN Take 750 mg by mouth daily.   methylPREDNISolone 4 MG Tbpk tablet Commonly known as: MEDROL DOSEPAK 6 day dose pack - take as directed   morphine 60 MG 12 hr tablet Commonly known as: MS CONTIN Take 1 tablet (60 mg total) by mouth every 12 (twelve) hours for 30 days. Must last 30 days.   Myrbetriq 25 MG Tb24 tablet Generic drug: mirabegron ER TAKE 1 TABLET BY MOUTH EVERY DAY (STILL PA 11/23)   nitrofurantoin (macrocrystal-monohydrate) 100 MG capsule Commonly known as: MACROBID Take 1 capsule (100  mg total) by mouth every 12 (twelve) hours for 5 days. Started by: Debroah Loop, PA-C   ondansetron 4 MG disintegrating tablet Commonly known as: ZOFRAN-ODT Take 4 mg by mouth every 8 (eight) hours as needed. for nausea   pregabalin 100 MG capsule Commonly known as: Lyrica Take 1 capsule (100 mg total) by mouth 2 (two) times daily. What changed:   how much to take  when to take this   sucralfate 1 g tablet Commonly known as: CARAFATE Take 1 g by mouth 2 (two) times daily.   traZODone 50 MG tablet Commonly known as: DESYREL TAKE 2 TABLETS BY MOUTH NIGHTLY AT BEDTIME    triamcinolone cream 0.1 % Commonly known as: KENALOG Apply 1 application topically 2 (two) times daily.   Wellbutrin SR 150 MG 12 hr tablet Generic drug: buPROPion Take 150 mg by mouth 2 (two) times daily.       Allergies:  Allergies  Allergen Reactions  . Methadone Palpitations  . Other Swelling and Other (See Comments)    Tricyclic antidepressants Unable to void   . Tape Other (See Comments)    blisters    Family History: Family History  Problem Relation Age of Onset  . Diabetes Mother   . Heart disease Mother   . Dementia Father   . Kidney disease Father     Social History:   reports that she has never smoked. She has never used smokeless tobacco. She reports that she does not drink alcohol or use drugs.  ROS: UROLOGY Frequent Urination?: Yes Hard to postpone urination?: Yes Burning/pain with urination?: Yes Get up at night to urinate?: Yes Leakage of urine?: Yes Urine stream starts and stops?: No Trouble starting stream?: No Do you have to strain to urinate?: No Blood in urine?: No Urinary tract infection?: Yes Sexually transmitted disease?: No Injury to kidneys or bladder?: No Painful intercourse?: No Weak stream?: No Currently pregnant?: No Vaginal bleeding?: No Last menstrual period?: n  Gastrointestinal Nausea?: Yes Vomiting?: No Indigestion/heartburn?: Yes Diarrhea?: No Constipation?: No  Constitutional Fever: No Night sweats?: No Weight loss?: No Fatigue?: Yes  Skin Skin rash/lesions?: No Itching?: No  Eyes Blurred vision?: No Double vision?: No  Ears/Nose/Throat Sore throat?: No Sinus problems?: No  Hematologic/Lymphatic Swollen glands?: No Easy bruising?: No  Cardiovascular Leg swelling?: No Chest pain?: No  Respiratory Cough?: No Shortness of breath?: No  Endocrine Excessive thirst?: No  Musculoskeletal Back pain?: Yes Joint pain?: Yes  Neurological Headaches?: Yes Dizziness?: Yes  Psychologic  Depression?: Yes Anxiety?: Yes  Physical Exam: BP 121/75 (BP Location: Left Arm, Patient Position: Sitting, Cuff Size: Normal)   Pulse 66   Ht 5\' 4"  (1.626 m)   Wt 181 lb (82.1 kg)   BMI 31.07 kg/m   Constitutional:  Alert and oriented, No acute distress, nontoxic appearing, shifting uncomfortably in chair. Cardiovascular: No clubbing, cyanosis, or edema. Respiratory: Normal respiratory effort, no increased work of breathing. GU: No CVA tenderness Skin: No rashes, bruises or suspicious lesions. Neurologic: Grossly intact, no focal deficits, moving all 4 extremities. Psychiatric: Normal mood and affect.  Laboratory Data: Urinalysis    Component Value Date/Time   APPEARANCEUR Clear 11/23/2018 1532   GLUCOSEU Negative 11/23/2018 1532   BILIRUBINUR Negative 11/23/2018 1532   PROTEINUR Negative 11/23/2018 1532   NITRITE Negative 11/23/2018 1532   LEUKOCYTESUR 1+ (A) 11/23/2018 1532   Results for orders placed or performed in visit on 11/23/18  Microscopic Examination     Status: Abnormal  Collection Time: 11/23/18  3:32 PM   URINE  Result Value Ref Range Status   WBC, UA 11-30 (A) 0 - 5 /hpf Final   RBC None seen 0 - 2 /hpf Final   Epithelial Cells (non renal) 0-10 0 - 10 /hpf Final   Renal Epithel, UA 0-10 (A) None seen /hpf Final   Casts Present (A) None seen /lpf Final   Cast Type Granular casts (A) N/A Final   Bacteria, UA Few None seen/Few Final   Assessment & Plan:   1. Acute UTI, complicated Patient with symptoms and UA consistent with urinary tract infection. Vitals within normal limits, no CVA tenderness, and no vomiting/flank pain/fevers/chills; clinical suspicion low for upper tract involvement at this time. Will order nitrofurantoin given patient's complex orthopedic comorbidities to avoid FQ use, however will keep clinical threshold low for switching to another antibiotic with better parenchymal penetration given her history of nephrectomy.  I counseled the  patient to call the office immediately if she develops new fever, chills, nausea, vomiting, or flank pain in the coming days, as this would indicate that we should change her antibiotic. She expressed an understanding of this plan. -Urinalysis, complete -Urine culture -Nitrofurantoin 100mg  BID x5 days -Follow up with Dr. Matilde Sprang in August  Debroah Loop, [place] [address], [place], [place] [address] [phone]

## 2018-11-26 LAB — CULTURE, URINE COMPREHENSIVE

## 2018-12-14 ENCOUNTER — Other Ambulatory Visit: Payer: Self-pay

## 2018-12-14 ENCOUNTER — Ambulatory Visit (INDEPENDENT_AMBULATORY_CARE_PROVIDER_SITE_OTHER): Payer: Medicaid Other | Admitting: Urology

## 2018-12-14 ENCOUNTER — Encounter: Payer: Self-pay | Admitting: Urology

## 2018-12-14 VITALS — BP 126/84 | HR 78 | Ht 64.0 in | Wt 181.0 lb

## 2018-12-14 DIAGNOSIS — N3946 Mixed incontinence: Secondary | ICD-10-CM

## 2018-12-14 MED ORDER — TROSPIUM CHLORIDE ER 60 MG PO CP24: 60.0000 mg | ORAL_CAPSULE | Freq: Every day | ORAL | 11 refills | Status: DC

## 2018-12-14 NOTE — Progress Notes (Signed)
12/14/2018 9:30 AM   [name] 1960-10-04 [id number]  Referring provider: [name], [place],  [place] [address]  Chief Complaint  Patient presents with   Follow-up    HPI: Dr [email or web address] has been followed for urothelial carcinoma and has had a left nephroureterectomy and BCG dating back 2013. She is on Myrbetriq for incontinence        The patient leaks with coughing sneezing and bending but not every time. She has urge incontinence. She has no bedwetting. 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 Both components are significant especially during coughing season.   She describes feeling fullness in the vagina but was nonspecific. She did report pressing her hand in the vagina and even digitally to pass reasonably normal bowel movements. She thinks there is a shelf. She tries to avoid sitting but I felt it was more from her back and not from vaginal symptoms.  On pelvic examination at rest she had a small grade 1 cystocele. When she would bear down she would have rotational descent of the anterior vaginal wall with a grade 2 cystocele that was smaller. Her uterus descended from 8 or 9 cm to approximately 6 cm. She had a small distal grade 2 rectocele. She leaked a few drops with coughing.  The patient has mixed incontinence and no question an overactive bladder. She is frequency nocturia. She has mild prolapse symptoms but does have rectocele symptoms. The role of urodynamics discussed. We will also look at the size of the cystocele during urodynamics. A picture was drawn. If she ever had surgery she would likely best benefit from a transvaginal hysterectomy with possible vault suspension cystocele repair and graft and rectocele repair. If the uterus was left in situ it may be difficult to actually perform a cystocele because at rest she has very little cystocele defects. Depending on goals her incontinence likely could be  managed nonsurgically.  Likely if she had a hysterectomy with fixation of the cuff to the ureteral sacral ligament she would probably not need a vault suspension. I again with the uterus removed she may not need a cystocele repair. Based upon today's examination one could do the rectocele and not the cystocele if the findings were sent to during surgery. She has more of off elasticity issue with descensus than a central defect.   During urodynamics the patient was catheterized for 30 mL. Maximum bladder capacity was 100 mL. Bladder had sensory urgency. Bladder was unstable reaching a pressure of 17 cm of water. She had multiple unstable bladder contractions. She would leak. Pressure reached 25 cm of water and she had to void. At 100 mL of Valsalva leak point pressure was 86 cm of water. It was difficult to assess the Valsalva leak point pressure he has a small capacity instability. [name] thinks the following was a voluntary void but was not definitive. She voided 38 mils a max flow 5 mils per second. Maximum voiding pressure was 25 cm of water. EMG activity increased during the voiding phase. Bladder neck descended 3 cm. She was quite uncomfortable it with back and leg pain during the test. She said she never feels in control of her bladder. It did not appear that she had bladder pain during filling. She really did not have much of a cystocele fluoroscopically.   I would like to treat the patient's mixed incontinence nonsurgically. I would be exceptionally reluctant to perform a sling based upon her clinical presentation and urodynamic  findings. It does not appear that a cystocele repair is needed but I would want to repeat her examination if she was going to have a rectocele repair. Whether or not she had a hysterectomy would depend again on the next examination.  Patient said that the incontinence and odor that bothers her the most. We will try to help her nonsurgically. We  will proceed accordingly and may reevaluate her with the pelvic examination in the future pending goals of rectocele and prolapse. Reassess in 6 weeks on Myrbetriq samples and prescription  Today Myrbetriq caused retention and potentially elevated blood pressure.  Toviaz her insurance would cover but it did not help.  Apparently insurance does not cover Detrol.  She is still bothered by the pressure in the vagina and the fact that she has to do some splinting for bowel movements.  She will has urgency and frequency  Reevaluate in 6 weeks on Vesicare's prescription since insurance covers this.  Reexamine patient and discuss treatment goals and potential for surgery as noted prior  TOday Patient is having tremendous back issues.  She is getting injections for pinched nerves in neck and lower back.  She might have to see the neurosurgeon soon based upon the history.  She went back on Myrbetriq and is not helping.  It caused retention before.  She is tried almost all antimuscarinics including oxybutynin failed Vesicare.  Clinically not infected.  We did not talk about the bowel issue.  She has had some bowel leakage.  Modifying factors: There are no other modifying factors  Associated signs and symptoms: There are no other associated signs and symptoms Aggravating and relieving factors: There are no other aggravating or relieving factors Severity: Moderate Duration: Persistent   PMH: Past Medical History:  Diagnosis Date   Anxiety    Arthritis    Bladder cancer ([place])    Cancer of kidney ([place])    DDD (degenerative disc disease), cervical    Depression    Failed back syndrome    Fibromyalgia    GERD (gastroesophageal reflux disease)    Heart murmur    Hyperlipemia    Hypertension    IBS (irritable bowel syndrome)    Kidney failure    Renal insufficiency    RSD (reflex sympathetic dystrophy)    Seizure ([facility] [facility])    Stomach ulcer    Trigeminal neuralgia      Surgical History: Past Surgical History:  Procedure Laterality Date   NEPHROURETERECTOMY  2011   TRANSURETHRAL RESECTION OF BLADDER TUMOR WITH GYRUS (TURBT-GYRUS)  2012, 2013    Home Medications:  Allergies as of 12/14/2018      Reactions   Methadone Palpitations   Other Swelling, Other (See Comments)   Tricyclic antidepressants Unable to void   Tape Other (See Comments)   blisters      Medication List       Accurate as of December 14, 2018  9:30 AM. If you have any questions, ask your nurse or doctor.        dicyclomine 20 MG tablet Commonly known as: BENTYL Take 20 mg by mouth 2 (two) times a day.   escitalopram 10 MG tablet Commonly known as: LEXAPRO TAKE 1 TABLET BY MOUTH EVERY DAY FOR MOOD   meclizine 25 MG tablet Commonly known as: ANTIVERT Take 25 mg by mouth 3 (three) times daily as needed for dizziness.   methocarbamol 750 MG tablet Commonly known as: ROBAXIN Take 750 mg by mouth daily.   methylPREDNISolone 4  MG Tbpk tablet Commonly known as: MEDROL DOSEPAK 6 day dose pack - take as directed   Myrbetriq 25 MG Tb24 tablet Generic drug: mirabegron ER TAKE 1 TABLET BY MOUTH EVERY DAY (STILL PA 11/23)   nortriptyline 10 MG capsule Commonly known as: PAMELOR START NORTRIPTYLINE 10 MG NIGHTLY FOR TWO WEEKS, THEN INCREASE TO 20 MG NIGHTLY AND CONTINUE   ondansetron 4 MG disintegrating tablet Commonly known as: ZOFRAN-ODT Take 4 mg by mouth every 8 (eight) hours as needed. for nausea   pregabalin 100 MG capsule Commonly known as: Lyrica Take 1 capsule (100 mg total) by mouth 2 (two) times daily. What changed:   how much to take  when to take this   sucralfate 1 g tablet Commonly known as: CARAFATE Take 1 g by mouth 2 (two) times daily.   traZODone 50 MG tablet Commonly known as: DESYREL TAKE 2 TABLETS BY MOUTH NIGHTLY AT BEDTIME   triamcinolone cream 0.1 % Commonly known as: KENALOG Apply 1 application topically 2 (two) times daily.    Wellbutrin SR 150 MG 12 hr tablet Generic drug: buPROPion Take 150 mg by mouth 2 (two) times daily.       Allergies:  Allergies  Allergen Reactions   Methadone Palpitations   Other Swelling and Other (See Comments)    Tricyclic antidepressants Unable to void    Tape Other (See Comments)    blisters    Family History: Family History  Problem Relation Age of Onset   Diabetes Mother    Heart disease Mother    Dementia Father    Kidney disease Father     Social History:  reports that she has never smoked. She has never used smokeless tobacco. She reports that she does not drink alcohol or use drugs.  ROS: UROLOGY Frequent Urination?: Yes Hard to postpone urination?: No Burning/pain with urination?: No Get up at night to urinate?: Yes Leakage of urine?: Yes Urine stream starts and stops?: No Trouble starting stream?: No Do you have to strain to urinate?: No Blood in urine?: No Urinary tract infection?: No Sexually transmitted disease?: No Injury to kidneys or bladder?: No Painful intercourse?: No Weak stream?: No Currently pregnant?: No Vaginal bleeding?: No Last menstrual period?: N  Gastrointestinal Nausea?: No Vomiting?: No Indigestion/heartburn?: No Diarrhea?: No Constipation?: No  Constitutional Fever: No Night sweats?: No Weight loss?: No Fatigue?: No  Skin Skin rash/lesions?: No Itching?: No  Eyes Blurred vision?: No Double vision?: No  Ears/Nose/Throat Sore throat?: No Sinus problems?: No  Hematologic/Lymphatic Swollen glands?: No Easy bruising?: No  Cardiovascular Leg swelling?: No Chest pain?: No  Respiratory Cough?: No Shortness of breath?: No  Endocrine Excessive thirst?: No  Musculoskeletal Back pain?: No Joint pain?: No  Neurological Headaches?: No Dizziness?: No  Psychologic Depression?: No Anxiety?: No  Physical Exam: BP 126/84    Pulse 78    Ht 5\' 4"  (1.626 m)    Wt 181 lb (82.1 kg)    BMI  31.07 kg/m   Constitutional:  Alert and oriented, No acute distress.  Laboratory Data: No results found for: WBC, HGB, HCT, MCV, PLT  Lab Results  Component Value Date   CREATININE 0.80 04/02/2018    No results found for: PSA  No results found for: TESTOSTERONE  No results found for: HGBA1C  Urinalysis    Component Value Date/Time   APPEARANCEUR Clear 11/23/2018 1532   GLUCOSEU Negative 11/23/2018 1532   BILIRUBINUR Negative 11/23/2018 1532   PROTEINUR Negative 11/23/2018 1532  NITRITE Negative 11/23/2018 1532   LEUKOCYTESUR 1+ (A) 11/23/2018 1532    Pertinent Imaging:   Assessment & Plan: Patient has a significant overactive bladder specimen urodynamics.  With the Myrbetriq retention history again she might have a neurogenic bladder.  She is having bowel complaints as well.  Certainly we cannot do her refractory therapy.  She was visibly uncomfortable today.  I will call in trospium recognizing his limitations.  I will check on her in 4 months  There are no diagnoses linked to this encounter.  No follow-ups on file.  Reece Packer, MD  [facility] [address] [place], [place] [address] [phone]

## 2018-12-14 NOTE — Addendum Note (Signed)
Addended by: Verlene Mayer A on: 12/14/2018 10:08 AM   Modules accepted: Orders

## 2018-12-17 ENCOUNTER — Other Ambulatory Visit: Payer: Self-pay

## 2018-12-17 ENCOUNTER — Encounter: Payer: Self-pay | Admitting: Student in an Organized Health Care Education/Training Program

## 2018-12-17 ENCOUNTER — Ambulatory Visit
Payer: Medicaid Other | Attending: Student in an Organized Health Care Education/Training Program | Admitting: Student in an Organized Health Care Education/Training Program

## 2018-12-17 DIAGNOSIS — G9050 Complex regional pain syndrome I, unspecified: Secondary | ICD-10-CM

## 2018-12-17 DIAGNOSIS — M47816 Spondylosis without myelopathy or radiculopathy, lumbar region: Secondary | ICD-10-CM

## 2018-12-17 DIAGNOSIS — T85192S Other mechanical complication of implanted electronic neurostimulator (electrode) of spinal cord, sequela: Secondary | ICD-10-CM | POA: Diagnosis present

## 2018-12-17 DIAGNOSIS — M21611 Bunion of right foot: Secondary | ICD-10-CM | POA: Diagnosis not present

## 2018-12-17 DIAGNOSIS — F329 Major depressive disorder, single episode, unspecified: Secondary | ICD-10-CM

## 2018-12-17 DIAGNOSIS — G894 Chronic pain syndrome: Secondary | ICD-10-CM | POA: Diagnosis not present

## 2018-12-17 DIAGNOSIS — M47812 Spondylosis without myelopathy or radiculopathy, cervical region: Secondary | ICD-10-CM

## 2018-12-17 DIAGNOSIS — Z981 Arthrodesis status: Secondary | ICD-10-CM | POA: Diagnosis present

## 2018-12-17 DIAGNOSIS — Z9889 Other specified postprocedural states: Secondary | ICD-10-CM

## 2018-12-17 MED ORDER — MORPHINE SULFATE ER 60 MG PO TBCR: 60.0000 mg | EXTENDED_RELEASE_TABLET | Freq: Two times a day (BID) | ORAL | 0 refills | Status: AC

## 2018-12-17 NOTE — Progress Notes (Signed)
Pain Management Virtual Encounter Note - Virtual Visit via [place] (real-time audio visits between healthcare provider and patient).   Patient's Phone No. & Preferred Pharmacy:  [phone] (home); [phone] (mobile); (Preferred) [phone] [email or web address]  [id number] [name], [facility] [address] [place] [address] Phone: [phone] Fax: [phone]    Pre-screening note:  Our staff contacted Lindsay Austin and offered her an "in person", "face-to-face" appointment versus a telephone encounter. She indicated preferring the telephone encounter, at this time.   Reason for Virtual Visit: COVID-19*  Social distancing based on CDC and AMA recommendations.   I contacted Lindsay Austin on 12/17/2018 via video conference.      I clearly identified myself as [name], MD. I verified that I was speaking with the correct person using two identifiers (Name: Lindsay Austin, and date of birth: 03/04/1961).  Advanced Informed Consent I sought verbal advanced consent from Lindsay Austin for virtual visit interactions. I informed Lindsay Austin of possible security and privacy concerns, risks, and limitations associated with providing "not-in-person" medical evaluation and management services. I also informed Lindsay Austin of the availability of "in-person" appointments. Finally, I informed her that there would be a charge for the virtual visit and that she could be  personally, fully or partially, financially responsible for it. Lindsay Austin expressed understanding and agreed to proceed.   Historic Elements   Lindsay Austin is a 58 y.o. year old, female patient evaluated today after her last encounter by our practice on 11/10/2018. Lindsay Austin  has a past medical history of Anxiety, Arthritis, Bladder cancer ([place]), Cancer of kidney ([place]), DDD (degenerative disc disease), cervical, Depression, Failed back syndrome, Fibromyalgia, GERD (gastroesophageal reflux  disease), Heart murmur, Hyperlipemia, Hypertension, IBS (irritable bowel syndrome), Kidney failure, Renal insufficiency, RSD (reflex sympathetic dystrophy), Seizure ([place]), Stomach ulcer, and Trigeminal neuralgia. She also  has a past surgical history that includes Transurethral resection of bladder tumor with gyrus (turbt-gyrus) (2012, 2013) and Nephroureterectomy (2011). Lindsay Austin has a current medication list which includes the following prescription(s): bupropion, dicyclomine, escitalopram, meclizine, methocarbamol, nortriptyline, ondansetron, pregabalin, sucralfate, trazodone, triamcinolone cream, trospium chloride, and morphine. She  reports that she has never smoked. She has never used smokeless tobacco. She reports that she does not drink alcohol or use drugs. Lindsay Austin is allergic to methadone; other; and tape.   HPI  Today, she is being contacted for both, medication management and a post-procedure assessment.   Evaluation of last interventional procedure  11/04/2018 Procedure:  Bilateral L3,4,5 Facet Block #1 Pre-procedure pain score:  5/10 Post-procedure pain score: 2/10         Influential Factors: Intra-procedural challenges: None observed.         Reported side-effects: None.        Post-procedural adverse reactions or complications: None reported         Sedation: Please see nurses note for DOS. When no sedatives are used, the analgesic levels obtained are directly associated to the effectiveness of the local anesthetics. However, when sedation is provided, the level of analgesia obtained during the initial 1 hour following the intervention, is believed to be the result of a combination of factors. These factors may include, but are not limited to: 1. The effectiveness of the local anesthetics used. 2. The effects of the analgesic(s) and/or anxiolytic(s) used. 3. The degree of discomfort experienced by the patient at the time of the procedure. 4. The patients ability and reliability in  recalling and recording the  events. 5. The presence and influence of possible secondary gains and/or psychosocial factors. Reported result: Relief experienced during the 1st hour after the procedure: 20 % (Ultra-Short Term Relief)            Interpretative annotation: Clinically possible results. Analgesia during this period is likely to be Local Anesthetic and/or IV Sedative (Analgesic/Anxiolytic) related.          Effects of local anesthetic: The analgesic effects attained during this period are directly associated to the localized infiltration of local anesthetics and therefore cary significant diagnostic value as to the etiological location, or anatomical origin, of the pain. Expected duration of relief is directly dependent on the pharmacodynamics of the local anesthetic used. Long-acting (4-6 hours) anesthetics used.  Reported result: Relief during the next 4 to 6 hour after the procedure: 40 % (Short-Term Relief)            Interpretative annotation: Clinically appropriate result. Analgesia during this period is likely to be Local Anesthetic-related.          Long-term benefit: Defined as the period of time past the expected duration of local anesthetics (1 hour for short-acting and 4-6 hours for long-acting). With the possible exception of prolonged sympathetic blockade from the local anesthetics, benefits during this period are typically attributed to, or associated with, other factors such as analgesic sensory neuropraxia, antiinflammatory effects, or beneficial biochemical changes provided by agents other than the local anesthetics.  Reported result: Extended relief following procedure: 0 % (Long-Term Relief)            Interpretative annotation: Clinically possible results. No benefit.   Pharmacotherapy Assessment  Analgesic: MS Contin 60 mg BID (pt transferred care from PA, was previously on 200 mg daily)}   10/30/2018  1   10/20/2018  Morphine Sulf Er 60 MG Tablet  60.00 30 Bi Lat    [id number]   Nor (2541)   0  120.00 MME  Medicaid   [place]     Monitoring: Pharmacotherapy: No side-effects or adverse reactions reported. [place] PMP: PDMP reviewed during this encounter.       Compliance: No problems identified. Effectiveness: Clinically acceptable. Plan: Refer to "POC".  UDS:  Summary  Date Value Ref Range Status  01/15/2018 FINAL  Final    Comment:    ==================================================================== TOXASSURE COMP DRUG ANALYSIS,UR ==================================================================== Test                             Result       Flag       Units Drug Present and Declared for Prescription Verification   Morphine                       >13514       EXPECTED   ng/mg creat   Normorphine                    2788         EXPECTED   ng/mg creat    Potential sources of large amounts of morphine in the absence of    codeine include administration of morphine or use of heroin.    Normorphine is an expected metabolite of morphine.   Hydromorphone                  699          EXPECTED   ng/mg creat  Hydromorphone may be present as a metabolite of morphine;    concentrations of hydromorphone rarely exceed 5% of the morphine    concentration when this is the source of hydromorphone.   Citalopram                     PRESENT      EXPECTED   Desmethylcitalopram            PRESENT      EXPECTED    Desmethylcitalopram is an expected metabolite of citalopram or    the enantiomeric form, escitalopram.   Trazodone                      PRESENT      EXPECTED   1,3 chlorophenyl piperazine    PRESENT      EXPECTED    1,3-chlorophenyl piperazine is an expected metabolite of    trazodone. Drug Absent but Declared for Prescription Verification   Methocarbamol                  Not Detected UNEXPECTED   Bupropion                      Not Detected UNEXPECTED   Milnacipran                    Not Detected  UNEXPECTED ==================================================================== Test                      Result    Flag   Units      Ref Range   Creatinine              74               mg/dL      >=20 ==================================================================== Declared Medications:  The flagging and interpretation on this report are based on the  following declared medications.  Unexpected results may arise from  inaccuracies in the declared medications.  **Note: The testing scope of this panel includes these medications:  Bupropion  Escitalopram  Methocarbamol  Milnacipran  Morphine (Morphine Sulfate)  Trazodone  **Note: The testing scope of this panel does not include following  reported medications:  Dicyclomine  Mirabegron  Ondansetron  Oxybutynin  Sucralfate ==================================================================== For clinical consultation, please call [phone]. ====================================================================    Laboratory Chemistry Profile (12 mo)  Renal: 04/02/2018: Creatinine, Ser 0.80  No results found for: GFRAA, GFRNONAA Hepatic: No results found for requested labs within last 8760 hours. No results found for: AST, ALT Other: No results found for requested labs within last 8760 hours. Note: Above Lab results reviewed.  Imaging  Last 90 days:  Dg Foot Complete Right  Result Date: 11/17/2018 Please see detailed radiograph report in office note.  Dg [facility] Clinic C-arm 1-60 Min No Report  Result Date: 11/04/2018 Fluoro was used, but no Radiologist interpretation will be provided. Please refer to "NOTES" tab for provider progress note.  Dg Pain Clinic C-arm 1-60 Min No Report  Result Date: 10/26/2018 Fluoro was used, but no Radiologist interpretation will be provided. Please refer to "NOTES" tab for provider progress note.   Assessment  The primary encounter diagnosis was Chronic pain syndrome. Diagnoses of  Lumbar facet arthropathy, Bunion of great toe of right foot, Lumbar spondylosis, Cervical spondylosis without myelopathy, Failed spinal cord stimulator, sequela, Hx of lumbosacral spine surgery, History of lumbar fusion (L5-S1 fusion), RSD (reflex  sympathetic dystrophy) (bilateral hands and feet), and Major depressive disorder with current active episode, unspecified depression episode severity, unspecified whether recurrent were also pertinent to this visit.  Plan of Care  I have discontinued Lindsay Austin and methylPREDNISolone. I am also having her start on morphine. Additionally, I am having her maintain her escitalopram, methocarbamol, ondansetron, traZODone, sucralfate, dicyclomine, buPROPion, meclizine, triamcinolone cream, pregabalin, nortriptyline, and Trospium Chloride.  Continue multimodal analgesics including Robaxin-750 milligrams daily, nortriptyline 10 mg nightly, Lyrica 50 mg 3 times daily.  Negative diagnostic lumbar facet medial branch nerve block.  Do not recommend repeating or proceeding with lumbar facet RFA.  Pharmacotherapy (Medications Ordered): Meds ordered this encounter  Medications  . morphine (MS CONTIN) 60 MG 12 hr tablet    Sig: Take 1 tablet (60 mg total) by mouth every 12 (twelve) hours. Must last 30 days.    Dispense:  60 tablet    Refill:  0    [place] STOP ACT - Not applicable. Fill one day early if pharmacy is closed on scheduled refill date.   Orders:  Orders Placed This Encounter  Procedures  . ToxASSURE Select 13 (MW), Urine    Volume: 30 ml(s). Minimum 3 ml of urine is needed. Document temperature of fresh sample. Indications: Long term (current) use of opiate analgesic (N86.767)   Follow-up plan:   Return in about 8 weeks (around 02/11/2019) for Medication Management, virtual.   Recent Visits Date Type Provider Dept  11/04/18 Procedure visit [name], MD [facility]  10/20/18 Office Visit [name], MD [facility]  09/30/18 Procedure visit [name], MD [facility]  Showing recent visits within past 90 days and meeting all other requirements   Today's Visits Date Type Provider Dept  12/17/18 Office Visit [name], MD [facility]  Showing today's visits and meeting all other requirements   Future Appointments No visits were found meeting these conditions.  Showing future appointments within next 90 days and meeting all other requirements   I discussed the assessment and treatment plan with the patient. The patient was provided an opportunity to ask questions and all were answered. The patient agreed with the plan and demonstrated an understanding of the instructions.  Patient advised to call back or seek an in-person evaluation if the symptoms or condition worsens.  Total duration of non-face-to-face encounter: 25 minutes.  Note by: [name], MD Date: 12/17/2018; Time: 12:22 PM  Note: This dictation was prepared with Dragon dictation. Any transcriptional errors that may result from this process are unintentional.  Disclaimer:  * Given the special circumstances of the COVID-19 pandemic, the federal government has announced that the Office for Civil Rights (OCR) will exercise its enforcement discretion and will not impose penalties on physicians using telehealth in the event of noncompliance with regulatory requirements under the [place] (HIPAA) in connection with the good faith provision of telehealth during the MCNOB-09 national public health emergency. ([place])

## 2018-12-29 ENCOUNTER — Other Ambulatory Visit: Payer: Self-pay

## 2018-12-29 ENCOUNTER — Encounter: Payer: Self-pay | Admitting: Podiatry

## 2018-12-29 ENCOUNTER — Ambulatory Visit (INDEPENDENT_AMBULATORY_CARE_PROVIDER_SITE_OTHER): Payer: Medicaid Other | Admitting: Podiatry

## 2018-12-29 DIAGNOSIS — M7751 Other enthesopathy of right foot: Secondary | ICD-10-CM

## 2018-12-31 LAB — TOXASSURE SELECT 13 (MW), URINE

## 2018-12-31 NOTE — Progress Notes (Signed)
   Subjective: 58-year-old female presenting today for follow up evaluation of a bunion, a Tailor's bunion, 3rd and 5th MPJ capsulitis of the right foot. She states her pain has not changed since her previous visit. She is currently being treated by Dr. Latif with pain management. Walking and wearing shoes increases her pain. Patient is here for further evaluation and treatment.   Past Medical History:  Diagnosis Date  . Anxiety   . Arthritis   . Bladder cancer ([facility])   . Cancer of kidney ([facility])   . DDD (degenerative disc disease), cervical   . Depression   . Failed back syndrome   . Fibromyalgia   . GERD (gastroesophageal reflux disease)   . Heart murmur   . Hyperlipemia   . Hypertension   . IBS (irritable bowel syndrome)   . Kidney failure   . Renal insufficiency   . RSD (reflex sympathetic dystrophy)   . Seizure ([facility])   . Stomach ulcer   . Trigeminal neuralgia      Objective: Physical Exam General: The patient is alert and oriented x3 in no acute distress.  Dermatology: Skin is cool, dry and supple bilateral lower extremities. Negative for open lesions or macerations.  Vascular: Palpable pedal pulses bilaterally. No edema or erythema noted. Capillary refill within normal limits.  Neurological: Epicritic and protective threshold grossly intact bilaterally.   Musculoskeletal Exam: Clinical evidence of Tailor's bunion deformity noted to the respective foot. There is a moderate pain on palpation range of motion of the fifth MPJ.  Clinical evidence of bunion deformity noted to the respective foot. There is moderate pain on palpation range of motion of the first MPJ. Lateral deviation of the hallux noted consistent with hallux abductovalgus.  Assessment: 1. Tailor's bunion deformity right 2. HAV w/ bunion deformity right  3. 3rd MPJ capsulitis right 4. 5th MPJ capsulitis right  5. H/o CRPS   Plan of Care:  1. Patient was evaluated.  2. Continue pain management with Dr.  Latif. 60 mg Morphine BID.  3. Injection of 0.5 mLs Celestone Soluspan injected into the 3rd MPJ of the right foot.  4. Injection of 0.5 mLs Celestone Soluspan injected into the 5th MPJ of the right foot.  5. Appointment with Rick, Pedorthist, for custom molded orthotics.  6. Met pads dispensed.  7. Return to clinic as needed.   Brent M. Evans, DPM [facility]  Dr. Brent M. Evans, DPM    [address]                                        [place], [place] [address]                Office [phone]  Fax [phone]    

## 2019-01-13 ENCOUNTER — Other Ambulatory Visit: Payer: Medicaid Other | Admitting: Orthotics

## 2019-01-13 ENCOUNTER — Other Ambulatory Visit: Payer: Self-pay

## 2019-01-13 ENCOUNTER — Ambulatory Visit: Payer: Medicaid Other | Admitting: Orthotics

## 2019-01-13 DIAGNOSIS — M7751 Other enthesopathy of right foot: Secondary | ICD-10-CM

## 2019-01-13 DIAGNOSIS — M21621 Bunionette of right foot: Secondary | ICD-10-CM

## 2019-01-13 DIAGNOSIS — M2011 Hallux valgus (acquired), right foot: Secondary | ICD-10-CM

## 2019-01-13 NOTE — Progress Notes (Signed)
Gave patient f/o DB OTS to try and put into shoes with offloading the 1/5 mpj; this before she pays 398.00 MEDICAID self pay to try multiple complex foot condition.   She has an appoinment with me in October to see how she did wth the DBS inserts.

## 2019-01-21 ENCOUNTER — Telehealth: Payer: Self-pay

## 2019-01-21 NOTE — Telephone Encounter (Signed)
PA sent through [facility] on 01/21/2019 per [facility]

## 2019-01-21 NOTE — Telephone Encounter (Signed)
Her morphine 60 mg needs prior authorization per patient

## 2019-01-25 ENCOUNTER — Telehealth: Payer: Self-pay | Admitting: Student in an Organized Health Care Education/Training Program

## 2019-01-25 NOTE — Telephone Encounter (Signed)
PA was sent on 01/21/19 but no records were sit with it.  This was a re PA and requires that records be sent.  Faxed today at 1530.

## 2019-01-25 NOTE — Telephone Encounter (Signed)
Cecille Rubin checked in to this and resent PA with copy of history.

## 2019-01-25 NOTE — Telephone Encounter (Signed)
Patient is calling to see if her meds have been PA? She called last week? Morphine 60mg . Please let her know status.

## 2019-02-04 ENCOUNTER — Encounter: Payer: Medicaid Other | Admitting: Psychology

## 2019-02-17 ENCOUNTER — Other Ambulatory Visit: Payer: Self-pay

## 2019-02-17 ENCOUNTER — Ambulatory Visit (INDEPENDENT_AMBULATORY_CARE_PROVIDER_SITE_OTHER): Payer: Medicaid Other | Admitting: Orthotics

## 2019-02-17 DIAGNOSIS — M21621 Bunionette of right foot: Secondary | ICD-10-CM

## 2019-02-17 DIAGNOSIS — M2011 Hallux valgus (acquired), right foot: Secondary | ICD-10-CM

## 2019-02-17 DIAGNOSIS — M7751 Other enthesopathy of right foot: Secondary | ICD-10-CM

## 2019-02-17 NOTE — Progress Notes (Signed)
Cast for CMFO to address cap 4/5 of right foot and FHL b/l.  Medicaid but agreed to charges.

## 2019-02-22 ENCOUNTER — Telehealth: Payer: Self-pay

## 2019-02-22 NOTE — Telephone Encounter (Signed)
Patient left voicemail needs letter excusing from jury duty [phone] based on her condition please advise

## 2019-02-23 ENCOUNTER — Encounter: Payer: Self-pay | Admitting: Student in an Organized Health Care Education/Training Program

## 2019-03-03 ENCOUNTER — Telehealth: Payer: Self-pay | Admitting: Student in an Organized Health Care Education/Training Program

## 2019-03-03 ENCOUNTER — Encounter: Payer: Self-pay | Admitting: Student in an Organized Health Care Education/Training Program

## 2019-03-03 NOTE — Telephone Encounter (Signed)
Ok thanks 

## 2019-03-03 NOTE — Telephone Encounter (Signed)
Patient is out of meds. I added to Thurs Schedule for 9:30. She will need chart Update

## 2019-03-04 ENCOUNTER — Encounter: Payer: Self-pay | Admitting: Student in an Organized Health Care Education/Training Program

## 2019-03-04 ENCOUNTER — Other Ambulatory Visit: Payer: Self-pay

## 2019-03-04 ENCOUNTER — Ambulatory Visit
Payer: Medicaid Other | Attending: Student in an Organized Health Care Education/Training Program | Admitting: Student in an Organized Health Care Education/Training Program

## 2019-03-04 DIAGNOSIS — M47816 Spondylosis without myelopathy or radiculopathy, lumbar region: Secondary | ICD-10-CM

## 2019-03-04 DIAGNOSIS — T85192S Other mechanical complication of implanted electronic neurostimulator (electrode) of spinal cord, sequela: Secondary | ICD-10-CM

## 2019-03-04 DIAGNOSIS — G894 Chronic pain syndrome: Secondary | ICD-10-CM

## 2019-03-04 DIAGNOSIS — Z981 Arthrodesis status: Secondary | ICD-10-CM

## 2019-03-04 DIAGNOSIS — M47812 Spondylosis without myelopathy or radiculopathy, cervical region: Secondary | ICD-10-CM

## 2019-03-04 DIAGNOSIS — G9050 Complex regional pain syndrome I, unspecified: Secondary | ICD-10-CM

## 2019-03-04 DIAGNOSIS — Q761 Klippel-Feil syndrome: Secondary | ICD-10-CM

## 2019-03-04 DIAGNOSIS — Z9889 Other specified postprocedural states: Secondary | ICD-10-CM

## 2019-03-04 DIAGNOSIS — F329 Major depressive disorder, single episode, unspecified: Secondary | ICD-10-CM

## 2019-03-04 DIAGNOSIS — M5136 Other intervertebral disc degeneration, lumbar region: Secondary | ICD-10-CM

## 2019-03-04 MED ORDER — MORPHINE SULFATE ER 60 MG PO TBCR: 60.0000 mg | EXTENDED_RELEASE_TABLET | Freq: Two times a day (BID) | ORAL | 0 refills | Status: AC

## 2019-03-04 MED ORDER — MORPHINE SULFATE ER 60 MG PO TBCR: 60.0000 mg | EXTENDED_RELEASE_TABLET | Freq: Two times a day (BID) | ORAL | 0 refills | Status: DC

## 2019-03-04 NOTE — Progress Notes (Signed)
Pain Management Virtual Encounter Note - Virtual Visit via [place] (real-time audio visits between healthcare provider and patient).   Patient's Phone No. & Preferred Pharmacy:  [phone] (home); [phone] (mobile); (Preferred) [phone] [email or web address]  [facility] #[id number] [name], [facility] [place] [address] [place] [address] Phone: [phone] Fax: [phone]    Pre-screening note:  Our staff contacted Ms. Beye and offered her an "in person", "face-to-face" appointment versus a telephone encounter. She indicated preferring the telephone encounter, at this time.   Reason for Virtual Visit: COVID-19*  Social distancing based on CDC and AMA recommendations.   I contacted Vickee Bignell on 03/04/2019 via video conference.      I clearly identified myself as [name], MD. I verified that I was speaking with the correct person using two identifiers (Name: Shayley Kilber, and date of birth: [birth year]).  Advanced Informed Consent I sought verbal advanced consent from [name] for virtual visit interactions. I informed Ms. Carreon of possible security and privacy concerns, risks, and limitations associated with providing "not-in-person" medical evaluation and management services. I also informed Ms. Abrahams of the availability of "in-person" appointments. Finally, I informed her that there would be a charge for the virtual visit and that she could be  personally, fully or partially, financially responsible for it. Ms. Maslowski expressed understanding and agreed to proceed.   Historic Elements   Ms. Sarinity Stansfield is a 58 y.o. year old, female patient evaluated today after her last encounter by our practice on 03/03/2019. Ms. Blechman  has a past medical history of Anxiety, Arthritis, Bladder cancer ([place]), Cancer of kidney ([place]), DDD (degenerative disc disease), cervical, Depression, Failed back syndrome, Fibromyalgia, GERD (gastroesophageal reflux  disease), Heart murmur, Hyperlipemia, Hypertension, IBS (irritable bowel syndrome), Kidney failure, Renal insufficiency, RSD (reflex sympathetic dystrophy), Seizure ([place]), Stomach ulcer, and Trigeminal neuralgia. She also  has a past surgical history that includes Transurethral resection of bladder tumor with gyrus (turbt-gyrus) (2012, 2013) and Nephroureterectomy (2011). Ms. Garber has a current medication list which includes the following prescription(s): bupropion, dicyclomine, meclizine, methocarbamol, ondansetron, pregabalin, sucralfate, trazodone, triamcinolone cream, trospium chloride, albuterol, escitalopram, morphine, morphine, and morphine. She  reports that she has never smoked. She has never used smokeless tobacco. She reports that she does not drink alcohol or use drugs. Ms. Kollie is allergic to methadone; other; and tape.   HPI  Today, she is being contacted for medication management.   Having increased headaches and neck pain Also having increased low back pain, R hip worse than left hip (worse with weightbearing) Did have a fall (closet door) since last visit Lyrica increased to 75 mg TID with Dr Melrose Nakayama Started monthly injectable for migraines  Pharmacotherapy Assessment  Analgesic: MS Contin 60 mg BID (pt transferred care from [place], was previously on 200 mg daily)}   Monitoring: Pharmacotherapy: No side-effects or adverse reactions reported. [place] PMP: PDMP reviewed during this encounter.       Compliance: No problems identified. Effectiveness: Clinically acceptable. Plan: Refer to "POC".  UDS:  Summary  Date Value Ref Range Status  12/28/2018 Note  Final    Comment:    ==================================================================== Tox[employer] Select 13 (MW) ==================================================================== Test                             Result       Flag       Units Drug Present and Declared for Prescription Verification   Morphine                        >  6757        EXPECTED   ng/mg creat   Normorphine                    1834         EXPECTED   ng/mg creat    Potential sources of large amounts of morphine in the absence of    codeine include administration of morphine or use of heroin.    Normorphine is an expected metabolite of morphine.   Hydromorphone                  401          EXPECTED   ng/mg creat    Hydromorphone may be present as a metabolite of morphine;    concentrations of hydromorphone rarely exceed 5% of the morphine    concentration when this is the source of hydromorphone. ==================================================================== Test                      Result    Flag   Units      Ref Range   Creatinine              148              mg/dL      >=20 ==================================================================== Declared Medications:  The flagging and interpretation on this report are based on the  following declared medications.  Unexpected results may arise from  inaccuracies in the declared medications.  **Note: The testing scope of this panel includes these medications:  Morphine  **Note: The testing scope of this panel does not include the  following reported medications:  Bupropion  Dicyclomine  Escitalopram  Meclizine  Methocarbamol  Nortriptyline  Ondansetron  Pregabalin  Sucralfate  Trazodone  Triamcinolone  Trospium ==================================================================== For clinical consultation, please call [phone]. ====================================================================    Laboratory Chemistry Profile (12 mo)  Renal: 04/02/2018: Creatinine, Ser 0.80  No results found for: GFR, GFRAA, GFRNONAA Hepatic: No results found for requested labs within last 8760 hours. No results found for: AST, ALT Other: No results found for requested labs within last 8760 hours. Note: Above Lab results reviewed.   Assessment  The primary encounter diagnosis  was Chronic pain syndrome. Diagnoses of Lumbar facet arthropathy, Lumbar spondylosis, Cervical spondylosis without myelopathy, Failed spinal cord stimulator, sequela, RSD (reflex sympathetic dystrophy) (bilateral hands and feet), History of lumbar fusion (L5-S1 fusion), Hx of lumbosacral spine surgery, Major depressive disorder with current active episode, unspecified depression episode severity, unspecified whether recurrent, Cervical fusion syndrome (C4-C7), and Lumbar degenerative disc disease were also pertinent to this visit.  Plan of Care  I have discontinued Elinore Sybert's nortriptyline. I am also having her start on morphine, morphine, and morphine. Additionally, I am having her maintain her methocarbamol, ondansetron, traZODone, sucralfate, dicyclomine, buPROPion, meclizine, triamcinolone cream, pregabalin, Trospium Chloride, albuterol, and escitalopram.  Pharmacotherapy (Medications Ordered): Meds ordered this encounter  Medications  . morphine (MS CONTIN) 60 MG 12 hr tablet    Sig: Take 1 tablet (60 mg total) by mouth every 12 (twelve) hours. Must last 30 days.    Dispense:  60 tablet    Refill:  0    [place] STOP ACT - Not applicable. Fill one day early if pharmacy is closed on scheduled refill date.  . morphine (MS CONTIN) 60 MG 12 hr tablet    Sig: Take 1 tablet (60 mg total) by mouth every 12 (twelve) hours. Must  last 30 days.    Dispense:  60 tablet    Refill:  0    [place] STOP ACT - Not applicable. Fill one day early if pharmacy is closed on scheduled refill date.  . morphine (MS CONTIN) 60 MG 12 hr tablet    Sig: Take 1 tablet (60 mg total) by mouth every 12 (twelve) hours. Must last 30 days.    Dispense:  60 tablet    Refill:  0    [place] STOP ACT - Not applicable. Fill one day early if pharmacy is closed on scheduled refill date.    Follow-up plan:   No follow-ups on file.   Recent Visits Date Type Provider Dept  12/17/18 Office Visit [name], MD [facility]   Showing recent visits within past 90 days and meeting all other requirements   Today's Visits Date Type Provider Dept  03/04/19 Office Visit [name], MD [facility]  Showing today's visits and meeting all other requirements   Future Appointments No visits were found meeting these conditions.  Showing future appointments within next 90 days and meeting all other requirements   I discussed the assessment and treatment plan with the patient. The patient was provided an opportunity to ask questions and all were answered. The patient agreed with the plan and demonstrated an understanding of the instructions.  Patient advised to call back or seek an in-person evaluation if the symptoms or condition worsens.  Total duration of non-face-to-face encounter: 25 minutes.  Note by: [name], MD Date: 03/04/2019; Time: 10:01 AM  Note: This dictation was prepared with Dragon dictation. Any transcriptional errors that may result from this process are unintentional.  Disclaimer:  * Given the special circumstances of the COVID-19 pandemic, the federal government has announced that the Office for Civil Rights (OCR) will exercise its enforcement discretion and will not impose penalties on physicians using telehealth in the event of noncompliance with regulatory requirements under the [place] (HIPAA) in connection with the good faith provision of telehealth during the XX123456 national public health emergency. ([place])

## 2019-03-09 ENCOUNTER — Other Ambulatory Visit: Payer: Self-pay | Admitting: Family Medicine

## 2019-03-09 DIAGNOSIS — Z1231 Encounter for screening mammogram for malignant neoplasm of breast: Secondary | ICD-10-CM

## 2019-03-17 ENCOUNTER — Other Ambulatory Visit: Payer: Medicaid Other | Admitting: Orthotics

## 2019-03-17 ENCOUNTER — Other Ambulatory Visit: Payer: Self-pay | Admitting: Neurology

## 2019-03-17 DIAGNOSIS — R29898 Other symptoms and signs involving the musculoskeletal system: Secondary | ICD-10-CM

## 2019-03-31 ENCOUNTER — Other Ambulatory Visit: Payer: Self-pay

## 2019-03-31 ENCOUNTER — Ambulatory Visit (INDEPENDENT_AMBULATORY_CARE_PROVIDER_SITE_OTHER): Payer: Medicaid Other | Admitting: Orthotics

## 2019-03-31 DIAGNOSIS — M2011 Hallux valgus (acquired), right foot: Secondary | ICD-10-CM

## 2019-03-31 DIAGNOSIS — M21621 Bunionette of right foot: Secondary | ICD-10-CM

## 2019-03-31 DIAGNOSIS — M7751 Other enthesopathy of right foot: Secondary | ICD-10-CM

## 2019-03-31 NOTE — Progress Notes (Signed)
Patient came in today to pick up custom made foot orthotics.  The goals were accomplished and the patient reported no dissatisfaction with said orthotics.  Patient was advised of breakin period and how to report any issues. 

## 2019-04-01 ENCOUNTER — Ambulatory Visit
Admission: RE | Admit: 2019-04-01 | Discharge: 2019-04-01 | Disposition: A | Discharge status: Home / Self Care | Payer: Medicaid Other | Source: Ambulatory Visit | Attending: Neurology | Admitting: Neurology

## 2019-04-01 DIAGNOSIS — R29898 Other symptoms and signs involving the musculoskeletal system: Secondary | ICD-10-CM | POA: Insufficient documentation

## 2019-04-19 ENCOUNTER — Other Ambulatory Visit: Payer: Self-pay

## 2019-04-19 ENCOUNTER — Ambulatory Visit (INDEPENDENT_AMBULATORY_CARE_PROVIDER_SITE_OTHER): Payer: Medicaid Other | Admitting: Urology

## 2019-04-19 ENCOUNTER — Encounter: Payer: Self-pay | Admitting: Urology

## 2019-04-19 VITALS — BP 113/78 | HR 87 | Ht 64.0 in | Wt 181.0 lb

## 2019-04-19 DIAGNOSIS — N3946 Mixed incontinence: Secondary | ICD-10-CM

## 2019-04-19 DIAGNOSIS — R310 Gross hematuria: Secondary | ICD-10-CM

## 2019-04-19 LAB — MICROSCOPIC EXAMINATION
Bacteria, UA: NONE SEEN
RBC, Urine: NONE SEEN /hpf (ref 0–2)

## 2019-04-19 LAB — URINALYSIS, COMPLETE
Bilirubin, UA: NEGATIVE
Glucose, UA: NEGATIVE
Ketones, UA: NEGATIVE
Leukocytes,UA: NEGATIVE
Nitrite, UA: NEGATIVE
Protein,UA: NEGATIVE
RBC, UA: NEGATIVE
Specific Gravity, UA: 1.015 (ref 1.005–1.030)
Urobilinogen, Ur: 0.2 mg/dL (ref 0.2–1.0)
pH, UA: 6 (ref 5.0–7.5)

## 2019-04-19 NOTE — Progress Notes (Signed)
04/19/2019 9:52 AM   Lindsay Austin 10-Sep-1960 [id number]  Referring provider: [name], [place] [address] [place],  [place] [address]  Chief Complaint  Patient presents with  . Follow-up    HPI: Dr [id number] has been followed for urothelial carcinoma and has had a left nephroureterectomy and BCG dating back 2013. She is on Myrbetriq for incontinence        The patient leaks with coughing sneezing and bending but not every time. She has urge incontinence. She has no bedwetting. 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 Both components are significant especially during coughing season.   She describes feeling fullness in the vagina but was nonspecific. She did report pressing her hand in the vagina and even digitally to pass reasonably normal bowel movements. She thinks there is a shelf.   On pelvic examination at rest she had a small grade 1 cystocele. When she would bear down she would have rotational descent of the anterior vaginal wall with a grade 2 cystocele that was smaller. Her uterus descended from 8 or 9 cm to approximately 6 cm. She had a small distal grade 2 rectocele. She leaked a few drops with coughing.  The patient has mixed incontinence and no question an overactive bladder. She is frequency nocturia. She has mild prolapse symptoms but does have rectocele symptoms. If she ever had surgery she would likely best benefit from a transvaginal hysterectomy with possible vault suspension cystocele repair and graft and rectocele repair. If the uterus was left in situ it may be difficult to actually perform a cystocele because at rest she has very little cystocele defects. Depending on goals her incontinence likely could be managed nonsurgically.  Likely if she had a hysterectomy with fixation of the cuff to the ureteral sacral ligament she would probably not need a vault suspension. I again with the uterus removed she may not need a  cystocele repair. Based upon today's examination one could do the rectocele and not the cystocele if the findings were sent to during surgery. She has more of off elasticity issue with descensus than a central defect.   During urodynamics the patient was catheterized for 30 mL. Maximum bladder capacity was 100 mL. Bladder had sensory urgency. Bladder was unstable reaching a pressure of 17 cm of water. She had multiple unstable bladder contractions. She would leak. Pressure reached 25 cm of water and she had to void. At 100 mL of Valsalva leak point pressure was 86 cm of water. It was difficult to assess the Valsalva leak point pressure he has a small capacity instability. [name] thinks the following was a voluntary void but was not definitive. She voided 38 mils a max flow 5 mils per second. Maximum voiding pressure was 25 cm of water. EMG activity increased during the voiding phase. Bladder neck descended 3 cm. She was quite uncomfortable it with back and leg pain during the test. She said she never feels in control of her bladder. she really did not have much of a cystocele fluoroscopically.  I would like to treat the patient's mixed incontinence nonsurgically. I would be exceptionally reluctant to perform a sling based upon her clinical presentation and urodynamic findings. It does not appear that a cystocele repair is needed but I would want to repeat her examination if she was going to have a rectocele repair. Whether or not she had a hysterectomy would depend again on the next examination.  Patient said that the incontinence and odor that  bothers her the most. We will try to help her nonsurgically. We will proceed accordingly and may reevaluate her with the pelvic examination in the future pending goals of rectocele and prolapse. Reassess in 6 weeks on Myrbetriq samples and prescription  Myrbetriq caused retention and potentially elevated blood pressure. Toviaz her  insurance would cover but it did not help. Apparently insurance does not cover Detrol. She is still bothered by the pressure in the vagina and the fact that she has to do some splinting for bowel movements. She will has urgency and frequency  Reevaluate in 6 weeks on Vesicare's prescription since insurance covers this. Reexamine patient and discuss treatment goals and potential for surgery as noted prior  TOday Patient is having tremendous back issues.  She is getting injections for pinched nerves in neck and lower back.  She might have to see the neurosurgeon soon based upon the history.  She went back on Myrbetriq and is not helping.  It caused retention before.  She is tried almost all antimuscarinics including oxybutynin failed Vesicare.  Clinically not infected.  We did not talk about the bowel issue.  She has had some bowel leakage.  Patient has a significant overactive bladder specimen urodynamics.  With the Myrbetriq retention history again she might have a neurogenic bladder.  She is having bowel complaints as well.  Certainly we cannot do her refractory therapy.  She was visibly uncomfortable today.  I will call in trospium recognizing his limitations.  Today Patient is seeing neurosurgeon tomorrow for possible neck surgery and/or low back surgery.  She uses a cane for ongoing and worsening leg weakness.  She is falling.  She had some recent blood in urine may be associated with dysuria.  She has a history of bladder infections.  Urine looked normal but sent for culture  Modifying factors: There are no other modifying factors  Associated signs and symptoms: There are no other associated signs and symptoms Aggravating and relieving factors: There are no other aggravating or relieving factors Severity: Moderate Duration: Persistent       PMH: Past Medical History:  Diagnosis Date  . Anxiety   . Arthritis   . Bladder cancer ([place])   . Cancer of kidney ([place])   . DDD  (degenerative disc disease), cervical   . Depression   . Failed back syndrome   . Fibromyalgia   . GERD (gastroesophageal reflux disease)   . Heart murmur   . Hyperlipemia   . Hypertension   . IBS (irritable bowel syndrome)   . Kidney failure   . Renal insufficiency   . RSD (reflex sympathetic dystrophy)   . Seizure ([place])   . Stomach ulcer   . Trigeminal neuralgia     Surgical History: Past Surgical History:  Procedure Laterality Date  . NEPHROURETERECTOMY  2011  . TRANSURETHRAL RESECTION OF BLADDER TUMOR WITH GYRUS (TURBT-GYRUS)  2012, 2013    Home Medications:  Allergies as of 04/19/2019      Reactions   Methadone Palpitations   Other Swelling, Other (See Comments)   Tricyclic antidepressants Unable to void   Tape Other (See Comments)   blisters      Medication List       Accurate as of April 19, 2019  9:52 AM. If you have any questions, ask your nurse or doctor.        albuterol 108 (90 Base) MCG/ACT inhaler Commonly known as: VENTOLIN HFA 2 PUFFS EVERY 4 6 HOURS AS NEEDED FOR WHEEZING.  MAY [employer] INS[employer] FORMULARY.   dicyclomine 20 MG tablet Commonly known as: BENTYL Take 20 mg by mouth 2 (two) times a day.   Emgality 120 MG/ML Soaj Generic drug: Galcanezumab-gnlm Inject into the skin.   escitalopram 20 MG tablet Commonly known as: LEXAPRO Take 20 mg by mouth daily. with food   meclizine 25 MG tablet Commonly known as: ANTIVERT Take 25 mg by mouth 3 (three) times daily as needed for dizziness.   methocarbamol 750 MG tablet Commonly known as: ROBAXIN Take 750 mg by mouth daily.   morphine 60 MG 12 hr tablet Commonly known as: MS CONTIN Take 1 tablet (60 mg total) by mouth every 12 (twelve) hours. Must last 30 days.   morphine 60 MG 12 hr tablet Commonly known as: MS CONTIN Take 1 tablet (60 mg total) by mouth every 12 (twelve) hours. Must last 30 days. Start taking on: May 03, 2019   ondansetron 4 MG disintegrating  tablet Commonly known as: ZOFRAN-ODT Take 4 mg by mouth every 8 (eight) hours as needed. for nausea   pregabalin 50 MG capsule Commonly known as: LYRICA Take by mouth. What changed: Another medication with the same name was removed. Continue taking this medication, and follow the directions you see here. Changed by: Reece Packer, MD   sucralfate 1 g tablet Commonly known as: CARAFATE Take 1 g by mouth 2 (two) times daily.   traZODone 50 MG tablet Commonly known as: DESYREL TAKE 2 TABLETS BY MOUTH NIGHTLY AT BEDTIME   triamcinolone cream 0.1 % Commonly known as: KENALOG Apply 1 application topically 2 (two) times daily.   Trospium Chloride 60 MG Cp24 Take 1 capsule (60 mg total) by mouth daily.   Wellbutrin SR 150 MG 12 hr tablet Generic drug: buPROPion Take 150 mg by mouth 2 (two) times daily.       Allergies:  Allergies  Allergen Reactions  . Methadone Palpitations  . Other Swelling and Other (See Comments)    Tricyclic antidepressants Unable to void   . Tape Other (See Comments)    blisters    Family History: Family History  Problem Relation Age of Onset  . Diabetes Mother   . Heart disease Mother   . Dementia Father   . Kidney disease Father     Social History:  reports that she has never smoked. She has never used smokeless tobacco. She reports that she does not drink alcohol or use drugs.  ROS: UROLOGY Frequent Urination?: Yes Hard to postpone urination?: Yes Burning/pain with urination?: Yes Get up at night to urinate?: Yes Leakage of urine?: Yes Urine stream starts and stops?: Yes Trouble starting stream?: Yes Do you have to strain to urinate?: Yes Blood in urine?: Yes Urinary tract infection?: No Sexually transmitted disease?: No Injury to kidneys or bladder?: No Painful intercourse?: No Weak stream?: No Currently pregnant?: No Vaginal bleeding?: No Last menstrual period?: n  Gastrointestinal Nausea?: No Vomiting?:  No Indigestion/heartburn?: No Diarrhea?: No Constipation?: No  Constitutional Fever: No Night sweats?: No Weight loss?: No Fatigue?: No  Skin Skin rash/lesions?: No Itching?: No  Eyes Blurred vision?: No Double vision?: No  Ears/Nose/Throat Sore throat?: No Sinus problems?: No  Hematologic/Lymphatic Swollen glands?: No Easy bruising?: No  Cardiovascular Leg swelling?: No Chest pain?: No  Respiratory Cough?: No Shortness of breath?: No  Endocrine Excessive thirst?: No  Musculoskeletal Back pain?: Yes Joint pain?: Yes  Neurological Headaches?: Yes Dizziness?: Yes  Psychologic Depression?: Yes Anxiety?: Yes  Physical Exam:  BP 113/78   Pulse 87   Ht 5\' 4"  (1.626 m)   Wt 82.1 kg   BMI 31.07 kg/m   Constitutional:  Alert and oriented, No acute distress.   Laboratory Data: No results found for: WBC, HGB, HCT, MCV, PLT  Lab Results  Component Value Date   CREATININE 0.80 04/02/2018    No results found for: PSA  No results found for: TESTOSTERONE  No results found for: HGBA1C  Urinalysis    Component Value Date/Time   APPEARANCEUR Clear 11/23/2018 1532   GLUCOSEU Negative 11/23/2018 1532   BILIRUBINUR Negative 11/23/2018 1532   PROTEINUR Negative 11/23/2018 1532   NITRITE Negative 11/23/2018 1532   LEUKOCYTESUR 1+ (A) 11/23/2018 1532    Pertinent Imaging:   Assessment & Plan:  There is clearly not an easy path to help the patient's bladder dysfunction and rectocele.  She has a lot of medical and neurologic issues.  She has had a urothelial carcinoma followed by Dr. Diamantina [place].  I will order a CT scan and have patient come back to see him for cystoscopy and reevaluation.  Call if urine culture is positive. She agrees that certainly she cannot be thinking about for instance prolapse surgery at this stage or InterStim for frequency  There are no diagnoses linked to this encounter.  No follow-ups on file.  Reece Packer,  MD  [facility] [address] [place], [place] [address] [phone]

## 2019-04-19 NOTE — Patient Instructions (Signed)
Cystoscopy Cystoscopy is a procedure that is used to help diagnose and sometimes treat conditions that affect the lower urinary tract. The lower urinary tract includes the bladder and the urethra. The urethra is the tube that drains urine from the bladder. Cystoscopy is done using a thin, tube-shaped instrument with a light and camera at the end (cystoscope). The cystoscope may be hard or flexible, depending on the goal of the procedure. The cystoscope is inserted through the urethra, into the bladder. Cystoscopy may be recommended if you have:  Urinary tract infections that keep coming back.  Blood in the urine (hematuria).  An inability to control when you urinate (urinary incontinence) or an overactive bladder.  Unusual cells found in a urine sample.  A blockage in the urethra, such as a urinary stone.  Painful urination.  An abnormality in the bladder found during an intravenous pyelogram (IVP) or CT scan. Cystoscopy may also be done to remove a sample of tissue to be examined under a microscope (biopsy). Tell a health care provider about:  Any allergies you have.  All medicines you are taking, including vitamins, herbs, eye drops, creams, and over-the-counter medicines.  Any problems you or family members have had with anesthetic medicines.  Any blood disorders you have.  Any surgeries you have had.  Any medical conditions you have.  Whether you are pregnant or may be pregnant. What are the risks? Generally, this is a safe procedure. However, problems may occur, including:  Infection.  Bleeding.  Allergic reactions to medicines.  Damage to other structures or organs. What happens before the procedure?  Ask your health care provider about: ? Changing or stopping your regular medicines. This is especially important if you are taking diabetes medicines or blood thinners. ? Taking medicines such as aspirin and ibuprofen. These medicines can thin your blood. Do not take  these medicines unless your health care provider tells you to take them. ? Taking over-the-counter medicines, vitamins, herbs, and supplements.  Follow instructions from your health care provider about eating or drinking restrictions.  Ask your health care provider what steps will be taken to help prevent infection. These may include: ? Washing skin with a germ-killing soap. ? Taking antibiotic medicine.  You may have an exam or testing, such as: ? X-rays of the bladder, urethra, or kidneys. ? Urine tests to check for signs of infection.  Plan to have someone take you home from the hospital or clinic. What happens during the procedure?   You will be given one or more of the following: ? A medicine to help you relax (sedative). ? A medicine to numb the area (local anesthetic).  The area around the opening of your urethra will be cleaned.  The cystoscope will be passed through your urethra into your bladder.  Germ-free (sterile) fluid will flow through the cystoscope to fill your bladder. The fluid will stretch your bladder so that your health care provider can clearly examine your bladder walls.  Your doctor will look at the urethra and bladder. Your doctor may take a biopsy or remove stones.  The cystoscope will be removed, and your bladder will be emptied. The procedure may vary among health care providers and hospitals. What can I expect after the procedure? After the procedure, it is common to have:  Some soreness or pain in your abdomen and urethra.  Urinary symptoms. These include: ? Mild pain or burning when you urinate. Pain should stop within a few minutes after you urinate. This   may last for up to 1 week. ? A small amount of blood in your urine for several days. ? Feeling like you need to urinate but producing only a small amount of urine. Follow these instructions at home: Medicines  Take over-the-counter and prescription medicines only as told by your health care  provider.  If you were prescribed an antibiotic medicine, take it as told by your health care provider. Do not stop taking the antibiotic even if you start to feel better. General instructions  Return to your normal activities as told by your health care provider. Ask your health care provider what activities are safe for you.  Do not drive for 24 hours if you were given a sedative during your procedure.  Watch for any blood in your urine. If the amount of blood in your urine increases, call your health care provider.  Follow instructions from your health care provider about eating or drinking restrictions.  If a tissue sample was removed for testing (biopsy) during your procedure, it is up to you to get your test results. Ask your health care provider, or the department that is doing the test, when your results will be ready.  Drink enough fluid to keep your urine pale yellow.  Keep all follow-up visits as told by your health care provider. This is important. Contact a health care provider if you:  Have pain that gets worse or does not get better with medicine, especially pain when you urinate.  Have trouble urinating.  Have more blood in your urine. Get help right away if you:  Have blood clots in your urine.  Have abdominal pain.  Have a fever or chills.  Are unable to urinate. Summary  Cystoscopy is a procedure that is used to help diagnose and sometimes treat conditions that affect the lower urinary tract.  Cystoscopy is done using a thin, tube-shaped instrument with a light and camera at the end.  After the procedure, it is common to have some soreness or pain in your abdomen and urethra.  Watch for any blood in your urine. If the amount of blood in your urine increases, call your health care provider.  If you were prescribed an antibiotic medicine, take it as told by your health care provider. Do not stop taking the antibiotic even if you start to feel better. This  information is not intended to replace advice given to you by your health care provider. Make sure you discuss any questions you have with your health care provider. Document Released: 04/12/2000 Document Revised: 04/07/2018 Document Reviewed: 04/07/2018 Elsevier Patient Education  2020 [employer]  

## 2019-04-19 NOTE — Addendum Note (Signed)
Addended by: Verlene Mayer A on: 04/19/2019 10:19 AM   Modules accepted: Orders

## 2019-04-21 LAB — CULTURE, URINE COMPREHENSIVE

## 2019-04-29 ENCOUNTER — Other Ambulatory Visit: Payer: Self-pay

## 2019-04-29 ENCOUNTER — Emergency Department
Admission: EM | Admit: 2019-04-29 | Discharge: 2019-04-29 | Disposition: A | Discharge status: Home / Self Care | Payer: Medicaid Other | Attending: Emergency Medicine | Admitting: Emergency Medicine

## 2019-04-29 ENCOUNTER — Encounter: Payer: Self-pay | Admitting: Emergency Medicine

## 2019-04-29 ENCOUNTER — Emergency Department: Payer: Medicaid Other

## 2019-04-29 DIAGNOSIS — I10 Essential (primary) hypertension: Secondary | ICD-10-CM | POA: Diagnosis not present

## 2019-04-29 DIAGNOSIS — Z8551 Personal history of malignant neoplasm of bladder: Secondary | ICD-10-CM | POA: Insufficient documentation

## 2019-04-29 DIAGNOSIS — M5416 Radiculopathy, lumbar region: Secondary | ICD-10-CM | POA: Diagnosis not present

## 2019-04-29 DIAGNOSIS — Z79899 Other long term (current) drug therapy: Secondary | ICD-10-CM | POA: Diagnosis not present

## 2019-04-29 DIAGNOSIS — Z85528 Personal history of other malignant neoplasm of kidney: Secondary | ICD-10-CM | POA: Insufficient documentation

## 2019-04-29 DIAGNOSIS — M545 Low back pain: Secondary | ICD-10-CM | POA: Diagnosis present

## 2019-04-29 LAB — URINALYSIS, COMPLETE (UACMP) WITH MICROSCOPIC
Bacteria, UA: NONE SEEN
Bilirubin Urine: NEGATIVE
Glucose, UA: NEGATIVE mg/dL
Hgb urine dipstick: NEGATIVE
Ketones, ur: NEGATIVE mg/dL
Leukocytes,Ua: NEGATIVE
Nitrite: NEGATIVE
Protein, ur: NEGATIVE mg/dL
Specific Gravity, Urine: 1.017 (ref 1.005–1.030)
pH: 5 (ref 5.0–8.0)

## 2019-04-29 MED ORDER — METAXALONE 800 MG PO TABS: 800.0000 mg | ORAL_TABLET | Freq: Two times a day (BID) | ORAL | 0 refills | Status: DC | PRN

## 2019-04-29 MED ORDER — ORPHENADRINE CITRATE 30 MG/ML IJ SOLN
60.0000 mg | Freq: Two times a day (BID) | INTRAMUSCULAR | Status: DC
  Administered 2019-04-29: 60 mg via INTRAMUSCULAR
  Filled 2019-04-29: qty 2

## 2019-04-29 MED ORDER — METHYLPREDNISOLONE SODIUM SUCC 125 MG IJ SOLR
125.0000 mg | Freq: Once | INTRAMUSCULAR | Status: AC
  Administered 2019-04-29: 22:00:00 125 mg via INTRAMUSCULAR
  Filled 2019-04-29: qty 2

## 2019-04-29 MED ORDER — PREDNISONE 10 MG PO TABS: ORAL_TABLET | ORAL | 0 refills | Status: DC

## 2019-04-29 MED ORDER — HYDROCODONE-ACETAMINOPHEN 5-325 MG PO TABS
1.0000 | ORAL_TABLET | Freq: Once | ORAL | Status: AC
  Administered 2019-04-29: 1 via ORAL
  Filled 2019-04-29: qty 1

## 2019-04-29 MED ORDER — LIDOCAINE 5 % EX PTCH
1.0000 | MEDICATED_PATCH | CUTANEOUS | Status: DC
  Administered 2019-04-29: 1 via TRANSDERMAL
  Filled 2019-04-29: qty 1

## 2019-04-29 MED ORDER — METAXALONE 800 MG PO TABS: 800.0000 mg | ORAL_TABLET | Freq: Every day | ORAL | 0 refills | Status: DC | PRN

## 2019-04-29 MED ORDER — METAXALONE 800 MG PO TABS: 800.0000 mg | ORAL_TABLET | Freq: Three times a day (TID) | ORAL | 0 refills | Status: DC

## 2019-04-29 MED ORDER — LIDOCAINE 5 % EX PTCH: 1.0000 | MEDICATED_PATCH | CUTANEOUS | 0 refills | Status: DC

## 2019-04-29 NOTE — ED Notes (Signed)
Pt taken to bathroom in wheelchair as she states she can not walk that far due to pain .

## 2019-04-29 NOTE — Discharge Instructions (Addendum)
Please begin steroids tomorrow for inflammation.  You can take the Skelaxin in the morning.  Discontinue this if it makes you tired.  You may still take your normal muscle relaxer at night.  You can use the Lidoderm patch for pain.  Please call your neurosurgeon on Monday for a follow-up appointment.  Please return the emergency department for worsening of symptoms.

## 2019-04-29 NOTE — ED Provider Notes (Signed)
[facility] Emergency Department Provider Note  ____________________________________________  Time seen: Approximately 9:00 PM  I have reviewed the triage vital signs and the nursing notes.   HISTORY  Chief Complaint Back Pain    HPI Lindsay Austin is a 58 y.o. female that presents to the emergency department for evaluation of pain and numbness to right buttocks radiating into the back of right leg.  Patient states that area feels numb but she can still feel when she presses to area, just feels different.  She has a specific spot to her mid buttocks that does not feel numb, it feels "burning."  Patient has a history of bladder cancer and kidney cancer and wears a depends for incontinence.  She has had some constipation.  No saddle anesthesias.  She has a history of falls but has not fallen in a couple of weeks.  Patient is scheduled to have surgery on her neck with Dr. Lacinda Axon in a couple of weeks.  No new neck pain.  She takes a muscle relaxer at night before bed.  No fevers, nausea, vomiting, abdominal pain.   Past Medical History:  Diagnosis Date  . Anxiety   . Arthritis   . Bladder cancer ([place])   . Cancer of kidney ([place])   . DDD (degenerative disc disease), cervical   . Depression   . Failed back syndrome   . Fibromyalgia   . GERD (gastroesophageal reflux disease)   . Heart murmur   . Hyperlipemia   . Hypertension   . IBS (irritable bowel syndrome)   . Kidney failure   . Renal insufficiency   . RSD (reflex sympathetic dystrophy)   . Seizure ([place])   . Stomach ulcer   . Trigeminal neuralgia     Patient Active Problem List   Diagnosis Date Noted  . Cervical spondylosis without myelopathy 09/30/2018  . Preoperative testing 09/30/2018  . Major depressive disorder with current active episode 07/23/2018  . Lumbar facet arthropathy 07/23/2018  . Hx of lumbosacral spine surgery 01/15/2018  . History of lumbar fusion (L5-S1 fusion) 01/15/2018  . Failed  spinal cord stimulator, sequela 01/15/2018  . Cervical fusion syndrome (C4-C7) 01/15/2018  . Chronic pain syndrome 01/15/2018    Past Surgical History:  Procedure Laterality Date  . NEPHROURETERECTOMY  2011  . TRANSURETHRAL RESECTION OF BLADDER TUMOR WITH GYRUS (TURBT-GYRUS)  2012, 2013    Prior to Admission medications   Medication Sig Start Date End Date Taking? Authorizing Provider  albuterol (VENTOLIN HFA) 108 (90 Base) MCG/ACT inhaler 2 PUFFS EVERY 4 6 HOURS AS NEEDED FOR WHEEZING. MAY SUBSTITUTE INSURANCE FORMULARY. 01/22/19   [provider]  buPROPion (WELLBUTRIN SR) 150 MG 12 hr tablet Take 150 mg by mouth 2 (two) times daily.    [provider]  dicyclomine (BENTYL) 20 MG tablet Take 20 mg by mouth 2 (two) times a day.     [provider]  escitalopram (LEXAPRO) 20 MG tablet Take 20 mg by mouth daily. with food 01/22/19   [provider]  Galcanezumab-gnlm (EMGALITY) 120 MG/ML SOAJ Inject into the skin. 04/15/19   [provider]  lidocaine (LIDODERM) 5 % Place 1 patch onto the skin daily. Remove & Discard patch within 12 hours or as directed by MD 04/29/19   Laban Emperor, PA-C  meclizine (ANTIVERT) 25 MG tablet Take 25 mg by mouth 3 (three) times daily as needed for dizziness.    [provider]  metaxalone (SKELAXIN) 800 MG tablet Take 1  tablet (800 mg total) by mouth daily as needed for muscle spasms. 04/29/19 04/28/20  Laban Emperor, PA-C  methocarbamol (ROBAXIN) 750 MG tablet Take 750 mg by mouth daily.  12/10/17   [provider]  morphine (MS CONTIN) 60 MG 12 hr tablet Take 1 tablet (60 mg total) by mouth every 12 (twelve) hours. Must last 30 days. 04/03/19 05/03/19  [name], MD  morphine (MS CONTIN) 60 MG 12 hr tablet Take 1 tablet (60 mg total) by mouth every 12 (twelve) hours. Must last 30 days. 05/03/19 06/02/19  [name], MD  ondansetron (ZOFRAN-ODT) 4 MG disintegrating tablet Take 4 mg by mouth every 8  (eight) hours as needed. for nausea 12/10/17   [provider]  predniSONE (DELTASONE) 10 MG tablet Take 6 tablets on day 1, take 5 tablets on day 2, take 4 tablets on day 3, take 3 tablets on day 4, take 2 tablets on day 5, take 1 tablet on day 6 04/29/19   Laban Emperor, PA-C  pregabalin (LYRICA) 50 MG capsule Take by mouth. 04/15/19   [provider]  sucralfate (CARAFATE) 1 g tablet Take 1 g by mouth 2 (two) times daily.    [provider]  traZODone (DESYREL) 50 MG tablet TAKE 2 TABLETS BY MOUTH NIGHTLY AT BEDTIME 12/10/17   [provider]  triamcinolone cream (KENALOG) 0.1 % Apply 1 application topically 2 (two) times daily.    [provider]  Trospium Chloride 60 MG CP24 Take 1 capsule (60 mg total) by mouth daily. 12/14/18   Bjorn Loser, MD    Allergies Methadone, Other, and Tape  Family History  Problem Relation Age of Onset  . Diabetes Mother   . Heart disease Mother   . Dementia Father   . Kidney disease Father     Social History Social History   Tobacco Use  . Smoking status: Never Smoker  . Smokeless tobacco: Never Used  Substance Use Topics  . Alcohol use: Never  . Drug use: Never     Review of Systems  Constitutional: No fever/chills Cardiovascular: No chest pain. Respiratory: No cough. No SOB. Gastrointestinal: No abdominal pain.  No nausea, no vomiting.  Musculoskeletal: Positive for buttocks and leg pain. Skin: Negative for rash, abrasions, lacerations, ecchymosis. Neurological: Negative for headaches. Positive for  numbness or tingling   ____________________________________________   PHYSICAL EXAM:  VITAL SIGNS: ED Triage Vitals  Enc Vitals Group     BP 04/29/19 2014 116/78     Pulse Rate 04/29/19 2014 80     Resp 04/29/19 2014 18     Temp 04/29/19 2014 99.1 F (37.3 C)     Temp Source 04/29/19 2014 Oral     SpO2 04/29/19 2014 98 %     Weight 04/29/19 2016 175 lb (79.4 kg)     Height  04/29/19 2016 5\' 4"  (1.626 m)     Head Circumference --      Peak Flow --      Pain Score 04/29/19 2015 4     Pain Loc --      Pain Edu? --      Excl. in [place]? --      Constitutional: Alert and oriented. Well appearing and in no acute distress. Eyes: Conjunctivae are normal. PERRL. EOMI. Head: Atraumatic. ENT:      Ears:      Nose: No congestion/rhinnorhea.      Mouth/Throat: Mucous membranes are moist.  Neck: No stridor.  Cardiovascular: Normal  rate, regular rhythm.  Good peripheral circulation. Respiratory: Normal respiratory effort without tachypnea or retractions. Lungs CTAB. Good air entry to the bases with no decreased or absent breath sounds. Gastrointestinal: Bowel sounds 4 quadrants. Soft and nontender to palpation. No guarding or rigidity. No palpable masses. No distention. Musculoskeletal: Full range of motion to all extremities. No gross deformities appreciated.  Tenderness to palpation throughout right buttocks with pinpoint area of tenderness to right mid buttocks.  Strength equal in lower extremities bilaterally.  Full range of motion of bilateral toes. Neurologic:  Normal speech and language. No gross focal neurologic deficits are appreciated.  Skin:  Skin is warm, dry and intact. No rash noted. Psychiatric: Mood and affect are normal. Speech and behavior are normal. Patient exhibits appropriate insight and judgement.   ____________________________________________   LABS (all labs ordered are listed, but only abnormal results are displayed)  Labs Reviewed  URINALYSIS, COMPLETE (UACMP) WITH MICROSCOPIC - Abnormal; Notable for the following components:      Result Value   Color, Urine YELLOW (*)    APPearance HAZY (*)    All other components within normal limits   ____________________________________________  EKG   ____________________________________________  RADIOLOGY Robinette Haines, personally viewed and evaluated these images (plain radiographs) as  part of my medical decision making, as well as reviewing the written report by the radiologist.  DG Lumbar Spine 2-3 Views  Result Date: 04/29/2019 CLINICAL DATA:  Low back and right hip pain. EXAM: LUMBAR SPINE - 2-3 VIEW COMPARISON:  None. FINDINGS: Status post surgical stabilization of L5-S1. No fracture or spondylolisthesis is noted. Remaining disc spaces are unremarkable. Hypertrophy is seen involving posterior facet joints of L4-5 consistent with degenerative change. IMPRESSION: Status post surgical stabilization of L5-S1. No acute abnormality seen in the lumbar spine. Electronically Signed   By: Marijo Conception M.D.   On: 04/29/2019 21:39   DG Hip Unilat W or Wo Pelvis 2-3 Views Right  Result Date: 04/29/2019 CLINICAL DATA:  Acute right hip pain. EXAM: DG HIP (WITH OR WITHOUT PELVIS) 2-3V RIGHT COMPARISON:  None. FINDINGS: There is no evidence of hip fracture or dislocation. There is no evidence of arthropathy or other focal bone abnormality. IMPRESSION: Negative. Electronically Signed   By: Marijo Conception M.D.   On: 04/29/2019 21:38    ____________________________________________    PROCEDURES  Procedure(s) performed:    Procedures    Medications  orphenadrine (NORFLEX) injection 60 mg (60 mg Intramuscular Given 04/29/19 2221)  lidocaine (LIDODERM) 5 % 1 patch (1 patch Transdermal Patch Applied 04/29/19 2220)  methylPREDNISolone sodium succinate (SOLU-MEDROL) 125 mg/2 mL injection 125 mg (125 mg Intramuscular Given 04/29/19 2223)  HYDROcodone-acetaminophen (NORCO/VICODIN) 5-325 MG per tablet 1 tablet (1 tablet Oral Given 04/29/19 2219)     ____________________________________________   INITIAL IMPRESSION / ASSESSMENT AND PLAN / ED COURSE  Pertinent labs & imaging results that were available during my care of the patient were reviewed by me and considered in my medical decision making (see chart for details).  Review of the [place] CSRS was performed in accordance of the  [place] prior to dispensing any controlled drugs.   Patient's diagnosis is consistent with lumbar radiculopathy.  Vital signs and exam are reassuring.  X-rays are negative for acute bony abnormalities.  Urinalysis non contributory for cystitis.  Patient was given Solu-Medrol, Norflex, Vicodin, Lidoderm patch in the emergency department for pain and inflammation.  Patient will be discharged home with prescriptions for prednisone and Skelaxin.  Patient  is to follow up with primary care and neurosurgery as directed.  Patient will call Dr. Lacinda Axon on Monday.  Patient is given ED precautions to return to the ED for any worsening or new symptoms.  Lindsay Austin was evaluated in Emergency Department on 04/29/2019 for the symptoms described in the history of present illness. She was evaluated in the context of the global COVID-19 pandemic, which necessitated consideration that the patient might be at risk for infection with the SARS-CoV-2 virus that causes COVID-19. Institutional protocols and algorithms that pertain to the evaluation of patients at risk for COVID-19 are in a state of rapid change based on information released by regulatory bodies including the CDC and federal and state organizations. These policies and algorithms were followed during the patient's care in the ED.  ____________________________________________  FINAL CLINICAL IMPRESSION(S) / ED DIAGNOSES  Final diagnoses:  Lumbar radiculopathy      NEW MEDICATIONS STARTED DURING THIS VISIT:  ED Discharge Orders         Ordered    predniSONE (DELTASONE) 10 MG tablet     04/29/19 2233    metaxalone (SKELAXIN) 800 MG tablet  3 times daily,   Status:  Discontinued     04/29/19 2233    lidocaine (LIDODERM) 5 %  Every 24 hours     04/29/19 2233    metaxalone (SKELAXIN) 800 MG tablet  2 times daily PRN,   Status:  Discontinued     04/29/19 2233    metaxalone (SKELAXIN) 800 MG tablet  Daily PRN     04/29/19 2234              This  chart was dictated using voice recognition software/Dragon. Despite best efforts to proofread, errors can occur which can change the meaning. Any change was purely unintentional.    Laban Emperor, PA-C 04/29/19 2319    Harvest Dark, MD 04/29/19 2329

## 2019-04-29 NOTE — ED Triage Notes (Signed)
Patient complain of numbness, swelling and pain in right hip. Patient has extensive spinal surgeries and tried calling neurologist today and they were out of the office until Monday. Patient states pain ha gotten worse the last couple of days.

## 2019-05-05 ENCOUNTER — Other Ambulatory Visit: Payer: Self-pay | Admitting: Neurosurgery

## 2019-05-05 DIAGNOSIS — M5441 Lumbago with sciatica, right side: Secondary | ICD-10-CM

## 2019-05-05 DIAGNOSIS — M25559 Pain in unspecified hip: Secondary | ICD-10-CM

## 2019-05-13 ENCOUNTER — Ambulatory Visit
Admission: RE | Admit: 2019-05-13 | Discharge: 2019-05-13 | Disposition: A | Discharge status: Home / Self Care | Payer: Medicaid Other | Source: Ambulatory Visit | Attending: Neurosurgery | Admitting: Neurosurgery

## 2019-05-13 ENCOUNTER — Other Ambulatory Visit: Payer: Self-pay

## 2019-05-13 DIAGNOSIS — M5441 Lumbago with sciatica, right side: Secondary | ICD-10-CM | POA: Diagnosis present

## 2019-05-13 DIAGNOSIS — M25559 Pain in unspecified hip: Secondary | ICD-10-CM | POA: Insufficient documentation

## 2019-05-18 ENCOUNTER — Other Ambulatory Visit: Payer: Self-pay

## 2019-05-18 ENCOUNTER — Ambulatory Visit
Admission: RE | Admit: 2019-05-18 | Discharge: 2019-05-18 | Disposition: A | Discharge status: Home / Self Care | Payer: Medicaid Other | Source: Ambulatory Visit | Attending: Urology | Admitting: Urology

## 2019-05-18 DIAGNOSIS — R310 Gross hematuria: Secondary | ICD-10-CM | POA: Insufficient documentation

## 2019-05-18 LAB — POCT I-STAT CREATININE: Creatinine, Ser: 1.4 mg/dL — ABNORMAL HIGH (ref 0.44–1.00)

## 2019-05-18 MED ORDER — IOHEXOL 300 MG/ML  SOLN
125.0000 mL | Freq: Once | INTRAMUSCULAR | Status: AC | PRN
  Administered 2019-05-18: 100 mL via INTRAVENOUS

## 2019-05-19 ENCOUNTER — Ambulatory Visit (INDEPENDENT_AMBULATORY_CARE_PROVIDER_SITE_OTHER): Payer: Medicaid Other | Admitting: Urology

## 2019-05-19 ENCOUNTER — Encounter: Payer: Self-pay | Admitting: Urology

## 2019-05-19 VITALS — BP 117/70 | HR 80 | Ht 64.0 in | Wt 181.0 lb

## 2019-05-19 DIAGNOSIS — R310 Gross hematuria: Secondary | ICD-10-CM | POA: Diagnosis not present

## 2019-05-19 DIAGNOSIS — N3946 Mixed incontinence: Secondary | ICD-10-CM

## 2019-05-19 DIAGNOSIS — C689 Malignant neoplasm of urinary organ, unspecified: Secondary | ICD-10-CM

## 2019-05-19 MED ORDER — LIDOCAINE HCL URETHRAL/MUCOSAL 2 % EX GEL
1.0000 "application " | Freq: Once | CUTANEOUS | Status: AC
  Administered 2019-05-19: 1 via URETHRAL

## 2019-05-19 NOTE — Progress Notes (Signed)
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 Cystoscopy was performed with a flexible cystoscope. The urethra, bladder neck and entire bladder was visualized in a standard fashion.The left ureteral orifice was surgically absent, right ureteral orifices orthotopic.  Mucosa grossly normal without any abnormalities.  Retroflexion normal.  Imaging: I personally reviewed the CT urogram dated 05/18/2019.  No evidence of disease recurrence.  Stable 1.2 cm midline omental lesion unchanged from prior and likely benign.  Findings: Cystoscopy  Plan: RTC 1 year for cystoscopy, repeat CTU January 2023 Continue follow-up with Dr. Matilde Sprang regarding incontinence  [name], MD 05/19/2019

## 2019-05-19 NOTE — Addendum Note (Signed)
Addended by: Donalee Citrin on: 05/19/2019 03:06 PM   Modules accepted: Orders

## 2019-05-20 ENCOUNTER — Encounter: Payer: Medicaid Other | Admitting: Psychology

## 2019-06-02 ENCOUNTER — Encounter: Payer: Self-pay | Admitting: Student in an Organized Health Care Education/Training Program

## 2019-06-03 ENCOUNTER — Ambulatory Visit
Payer: Medicaid Other | Attending: Student in an Organized Health Care Education/Training Program | Admitting: Student in an Organized Health Care Education/Training Program

## 2019-06-03 ENCOUNTER — Other Ambulatory Visit: Payer: Self-pay

## 2019-06-03 ENCOUNTER — Encounter: Payer: Self-pay | Admitting: Student in an Organized Health Care Education/Training Program

## 2019-06-03 DIAGNOSIS — X58XXXS Exposure to other specified factors, sequela: Secondary | ICD-10-CM

## 2019-06-03 DIAGNOSIS — M4802 Spinal stenosis, cervical region: Secondary | ICD-10-CM

## 2019-06-03 DIAGNOSIS — G894 Chronic pain syndrome: Secondary | ICD-10-CM

## 2019-06-03 DIAGNOSIS — S7001XS Contusion of right hip, sequela: Secondary | ICD-10-CM | POA: Diagnosis not present

## 2019-06-03 DIAGNOSIS — Z981 Arthrodesis status: Secondary | ICD-10-CM

## 2019-06-03 DIAGNOSIS — T85192S Other mechanical complication of implanted electronic neurostimulator (electrode) of spinal cord, sequela: Secondary | ICD-10-CM

## 2019-06-03 DIAGNOSIS — M47816 Spondylosis without myelopathy or radiculopathy, lumbar region: Secondary | ICD-10-CM

## 2019-06-03 DIAGNOSIS — Z9889 Other specified postprocedural states: Secondary | ICD-10-CM

## 2019-06-03 DIAGNOSIS — Q761 Klippel-Feil syndrome: Secondary | ICD-10-CM

## 2019-06-03 DIAGNOSIS — S7001XA Contusion of right hip, initial encounter: Secondary | ICD-10-CM | POA: Insufficient documentation

## 2019-06-03 MED ORDER — METHOCARBAMOL 500 MG PO TABS: 500.0000 mg | ORAL_TABLET | Freq: Two times a day (BID) | ORAL | 2 refills | Status: DC | PRN

## 2019-06-03 MED ORDER — ACETAMINOPHEN 500 MG PO TABS: 1000.0000 mg | ORAL_TABLET | Freq: Two times a day (BID) | ORAL | 2 refills | Status: AC | PRN

## 2019-06-03 MED ORDER — MORPHINE SULFATE ER 60 MG PO TBCR: 60.0000 mg | EXTENDED_RELEASE_TABLET | Freq: Two times a day (BID) | ORAL | 0 refills | Status: DC

## 2019-06-03 MED ORDER — MORPHINE SULFATE ER 60 MG PO TBCR: 60.0000 mg | EXTENDED_RELEASE_TABLET | Freq: Two times a day (BID) | ORAL | 0 refills | Status: AC

## 2019-06-03 NOTE — Progress Notes (Signed)
Patient: Lindsay Austin  Service Category: E/M  Provider: [name], MD  DOB: Aug 19, 1960  DOS: 06/03/2019  Location: Office  MRN: [id number]  Setting: Ambulatory outpatient  Referring Provider: [name], PA  Type: Established Patient  Specialty: Interventional Pain Management  PCP: [name], PA  Location: Home  Delivery: TeleHealth     Virtual Encounter - Pain Management PROVIDER NOTE: Information contained herein reflects review and annotations entered in association with encounter. Interpretation of such information and data should be left to medically-trained personnel. Information provided to patient can be located elsewhere in the medical record under "Patient Instructions". Document created using STT-dictation technology, any transcriptional errors that may result from process are unintentional.    Contact & Pharmacy Preferred: [phone] Home: [phone] (home) Mobile: [phone] (mobile) E-mail: [email or web address]  [id number] [name], [place] [address] [place] [address] Phone: [phone] Fax: [phone]   Pre-screening  [name] offered "in-person" vs "virtual" encounter. She indicated preferring virtual for this encounter.   Reason COVID-19*  Social distancing based on CDC and AMA recommendations.   I contacted Lindsay Austin on 06/03/2019 via telephone.      I clearly identified myself as [name], MD. I verified that I was speaking with the correct person using two identifiers (Name: Lindsay Austin, and date of birth: 59-Apr-1962).  This visit was completed via telephone due to the restrictions of the COVID-19 pandemic. All issues as above were discussed and addressed but no physical exam was performed. If it was felt that the patient should be evaluated in the office, they were directed there. The patient verbally consented to this visit. Patient was unable to complete an audio/visual visit due to Technical difficulties  and/or Lack of internet. Due to the catastrophic nature of the COVID-19 pandemic, this visit was done through audio contact only.  Location of the patient: home address (see Epic for details)  Location of the provider: office Consent I sought verbal advanced consent from Lindsay Austin for virtual visit interactions. I informed Ms. Loeber of possible security and privacy concerns, risks, and limitations associated with providing "not-in-person" medical evaluation and management services. I also informed Ms. Sangiacomo of the availability of "in-person" appointments. Finally, I informed her that there would be a charge for the virtual visit and that she could be  personally, fully or partially, financially responsible for it. Ms. Gornik expressed understanding and agreed to proceed.   Historic Elements   Lindsay Austin is a 59 y.o. year old, female patient evaluated today after her last contact with our practice on 03/04/2019. Lindsay Austin  has a past medical history of Anxiety, Arthritis, Bladder cancer ([place]), Cancer of kidney ([place]), DDD (degenerative disc disease), cervical, Depression, Failed back syndrome, Fibromyalgia, GERD (gastroesophageal reflux disease), Heart murmur, Hyperlipemia, Hypertension, IBS (irritable bowel syndrome), Kidney failure, Renal insufficiency, RSD (reflex sympathetic dystrophy), Seizure ([place]), Stomach ulcer, and Trigeminal neuralgia. She also  has a past surgical history that includes Transurethral resection of bladder tumor with gyrus (turbt-gyrus) (2012, 2013) and Nephroureterectomy (2011). Lindsay Austin has a current medication list which includes the following prescription(s): albuterol, bupropion, dicyclomine, escitalopram, emgality, lidocaine, meclizine, [START ON 06/10/2019] morphine, [START ON 07/10/2019] morphine, [START ON 08/09/2019] morphine, ondansetron, pregabalin, rizatriptan, sucralfate, trazodone, triamcinolone cream, acetaminophen, and methocarbamol. She  reports that she has never  smoked. She has never used smokeless tobacco. She reports that she does not drink alcohol or use drugs. Lindsay Austin is allergic to methadone; other; and tape.  HPI  Today, she is being contacted for medication management.   Went to ER on new years eve for right hip pain and slight swelling (due to fall) At the ER, lumbar spine MRI and right hip MRI were obtained.  Right hip MRI showed soft tissue edema as well as likely muscle contusion in the right gluteus muscle.  Patient also has a degenerated right superior labrum.  She would like to discuss options to help manage her acute on chronic hip pain.   Patient also has been evaluated by Dr. Lacinda Axon for severe neck pain and migraines.  Of note patient does have a history of C4-C7 ACDF.  She had an MRI of her cervical spine done on 04/01/2019 which shows residual stenosis at C6-C7 causing moderate to severe central canal stenosis.  They have discussed surgical intervention.  Patient states that they are planning for a posterior decompression at C6-C7 with additional fusion from C5-T2.  Pharmacotherapy Assessment  Analgesic: MS Contin 60 mg BID (pt transferred care from [place], was previously on 200 mg daily)}   Monitoring: [place] PMP: PDMP reviewed during this encounter.       Pharmacotherapy: No side-effects or adverse reactions reported. Compliance: No problems identified. Effectiveness: Clinically acceptable. Plan: Refer to "POC".  UDS:  Summary  Date Value Ref Range Status  12/28/2018 Note  Final    Comment:    ==================================================================== [employer] Select 13 (MW) ==================================================================== Test                             Result       Flag       Units Drug Present and Declared for Prescription Verification   Morphine                       >6757        EXPECTED   ng/mg creat   Normorphine                    1834         EXPECTED   ng/mg creat    Potential sources  of large amounts of morphine in the absence of    codeine include administration of morphine or use of heroin.    Normorphine is an expected metabolite of morphine.   Hydromorphone                  401          EXPECTED   ng/mg creat    Hydromorphone may be present as a metabolite of morphine;    concentrations of hydromorphone rarely exceed 5% of the morphine    concentration when this is the source of hydromorphone. ==================================================================== Test                      Result    Flag   Units      Ref Range   Creatinine              148              mg/dL      >=20 ==================================================================== Declared Medications:  The flagging and interpretation on this report are based on the  following declared medications.  Unexpected results may arise from  inaccuracies in the declared medications.  **Note: The testing scope of this panel includes these medications:  Morphine  **Note: The testing scope  of this panel does not include the  following reported medications:  Bupropion  Dicyclomine  Escitalopram  Meclizine  Methocarbamol  Nortriptyline  Ondansetron  Pregabalin  Sucralfate  Trazodone  Triamcinolone  Trospium ==================================================================== For clinical consultation, please call [phone]. ====================================================================      Imaging  CT HEMATURIA WORKUP CLINICAL DATA:  Hematuria, status post left nephrectomy  EXAM: CT ABDOMEN AND PELVIS WITHOUT AND WITH CONTRAST  TECHNIQUE: Multidetector CT imaging of the abdomen and pelvis was performed following the standard protocol before and following the bolus administration of intravenous contrast.  CONTRAST:  19mL OMNIPAQUE IOHEXOL 300 MG/ML  SOLN  COMPARISON:  04/02/2018  FINDINGS: Lower chest: No acute abnormality.  Scarring of the left lung  base.  Hepatobiliary: No focal liver abnormality is seen. Status post cholecystectomy. No biliary dilatation.  Pancreas: Severe fatty atrophy of the pancreas. No pancreatic ductal dilatation or surrounding inflammatory changes.  Spleen: Normal in size without significant abnormality.  Adrenals/Urinary Tract: Adrenal glands are unremarkable. Status post left nephrectomy. No suspicious soft tissue or abnormal contrast enhancement at the nephrectomy site. Bladder is unremarkable.  Stomach/Bowel: Stomach is within normal limits. Appendix appears normal. No evidence of bowel wall thickening, distention, or inflammatory changes.  Vascular/Lymphatic: No significant vascular findings are present. No enlarged abdominal or pelvic lymph nodes.  Reproductive: No mass or other significant abnormality.  Other: No abdominal wall hernia or abnormality. No abdominopelvic ascites. Unchanged 1.2 cm fluid or fat attenuation nodule of the midline ventral peritoneum or omentum (series 7, image 59).  Musculoskeletal: No acute or significant osseous findings.  IMPRESSION: 1. Status post left nephrectomy without evidence of recurrent or metastatic disease. No suspicious soft tissue or contrast enhancement at the nephrectomy site. 2. Unchanged 1.2 cm fluid or fat attenuation nodule of the midline ventral peritoneum or omentum (series 7, image 59). Given location and stability, this may reflect a small postoperative seroma and is of doubtful clinical significance.  Electronically Signed   By: Eddie Candle M.D.   On: 05/18/2019 09:44  CLINICAL DATA:  Chronic right-sided low back pain with right hip and thigh pain. Recent falls.  EXAM: MRI LUMBAR SPINE WITHOUT CONTRAST  TECHNIQUE: Multiplanar, multisequence MR imaging of the lumbar spine was performed. No intravenous contrast was administered.  COMPARISON:  Radiography 04/29/2019.  MRI 06/09/2018.  FINDINGS: Segmentation: 5 lumbar type  vertebral bodies as numbered previously.  Alignment: Normal except for 1 or 2 mm of degenerative anterolisthesis at L4-5.  Vertebrae: Benign appearing marrow foci within L2, L3 and L4, many of which represent hemangiomas, unchanged since 1 year ago and consistent with benign findings.  Conus medullaris and cauda equina: Conus extends to the L1 level. Conus and cauda equina appear normal.  Paraspinal and other soft tissues: Absent left kidney.  Disc levels:  No significant finding at L2-3 or above.  L3-4: Mild bulging of the disc.  No compressive stenosis.  L4-5: Bilateral facet osteoarthritis with 1 or 2 mm of anterolisthesis. Bulging of the disc. Stenosis of both lateral recesses but without distinct neural compression. Findings could certainly relate to low back pain or referred facet syndrome pain.  L5-S1: Previous posterior decompression, diskectomy and fusion procedure. No evidence of stenosis. 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 No change.  IMPRESSION: L4-5: Bilateral facet arthropathy with 1 or 2 mm of anterolisthesis. Bulging of the disc. Narrowing of the subarticular lateral recesses that could possibly be symptomatic. The facet arthropathy could also be a cause of back pain or referred  facet syndrome pain. Findings appear similar the study of last year.  No evidence of fracture or other recent traumatic finding.   CLINICAL DATA:  Low back, right hip and thigh pain and swelling. The patient reports multiple falls. Initial encounter.  EXAM: MR OF THE RIGHT HIP WITHOUT CONTRAST  TECHNIQUE: Multiplanar, multisequence MR imaging was performed. No intravenous contrast was administered.  COMPARISON:  None.  FINDINGS: Bones: Marrow signal is normal in both hips and throughout the pelvis without fracture, contusion, stress change or focal lesion. There is no subchondral cyst formation or edema about the hips. No avascular necrosis  of the femoral heads.  Articular cartilage and labrum  Articular cartilage:  Mildly degenerated without focal defect.  Labrum:  The superior labrum is markedly degenerated and diminutive.  Joint or bursal effusion  Joint effusion:  None.  Bursae: Negative.  Muscles and tendons  Muscles and tendons: There is partial visualization extensive increased T2 signal abnormality in the right gluteal musculature which appears worst in the medial gluteus maximus. No muscle or tendon tear is identified. Mild right hamstring tendinosis is noted.  Other findings  Miscellaneous: Imaged intrapelvic contents demonstrate no acute or focal abnormality. The patient is status post hysterectomy.  IMPRESSION: Extensive edema in the right gluteal musculature which appears worst in the medial gluteus maximus is likely due to contusion given the patient's history of falls. Myositis is possible but thought less likely. This finding is imaged in the coronal plane only.  Negative for acute bony abnormality.  Markedly diminutive and degenerated right superior labrum. Mild cartilage thinning about the right hip is also noted.  Mild right hamstring tendinosis.  Assessment  The primary encounter diagnosis was Contusion of right hip, sequela. Diagnoses of Spinal stenosis in cervical region, Lumbar spondylosis, Failed spinal cord stimulator, sequela, History of lumbar fusion (L5-S1 fusion), Hx of lumbosacral spine surgery, Cervical fusion syndrome (C4-C7), and Chronic pain syndrome were also pertinent to this visit.  Plan of Care  I have discontinued Kasandra Grigg's methocarbamol, Trospium Chloride, and metaxalone. I am also having her start on methocarbamol, acetaminophen, morphine, and morphine. Additionally, I am having her maintain her ondansetron, traZODone, sucralfate, dicyclomine, buPROPion, meclizine, triamcinolone cream, albuterol, escitalopram, Emgality, pregabalin, lidocaine,  rizatriptan, and morphine.  Pharmacotherapy (Medications Ordered): Meds ordered this encounter  Medications  . methocarbamol (ROBAXIN) 500 MG tablet    Sig: Take 1-2 tablets (500-1,000 mg total) by mouth every 12 (twelve) hours as needed for muscle spasms.    Dispense:  120 tablet    Refill:  2    Do not place this medication, or any other prescription from our practice, on "Automatic Refill". Patient may have prescription filled one day early if pharmacy is closed on scheduled refill date.  Marland Kitchen acetaminophen (TYLENOL) 500 MG tablet    Sig: Take 2 tablets (1,000 mg total) by mouth 2 (two) times daily as needed.    Dispense:  100 tablet    Refill:  2  . morphine (MS CONTIN) 60 MG 12 hr tablet    Sig: Take 1 tablet (60 mg total) by mouth every 12 (twelve) hours. Must last 30 days.    Dispense:  60 tablet    Refill:  0    [place] STOP ACT - Not applicable. Fill one day early if pharmacy is closed on scheduled refill date.  . morphine (MS CONTIN) 60 MG 12 hr tablet    Sig: Take 1 tablet (60 mg total) by mouth every 12 (twelve) hours. Must  last 30 days.    Dispense:  60 tablet    Refill:  0    [place] STOP ACT - Not applicable. Fill one day early if pharmacy is closed on scheduled refill date.  . morphine (MS CONTIN) 60 MG 12 hr tablet    Sig: Take 1 tablet (60 mg total) by mouth every 12 (twelve) hours. Must last 30 days.    Dispense:  60 tablet    Refill:  0    [place] STOP ACT - Not applicable. Fill one day early if pharmacy is closed on scheduled refill date.   Orders:  No orders of the defined types were placed in this encounter.  Follow-up plan:   Return in about 3 months (around 08/31/2019) for Medication Management.    Recent Visits No visits were found meeting these conditions.  Showing recent visits within past 90 days and meeting all other requirements   Today's Visits Date Type Provider Dept  06/03/19 Office Visit [name], MD [facility]  Showing today's visits and  meeting all other requirements   Future Appointments No visits were found meeting these conditions.  Showing future appointments within next 90 days and meeting all other requirements   I discussed the assessment and treatment plan with the patient. The patient was provided an opportunity to ask questions and all were answered. The patient agreed with the plan and demonstrated an understanding of the instructions.  Patient advised to call back or seek an in-person evaluation if the symptoms or condition worsens.  Duration of encounter: 25 minutes.  Note by: [name], MD Date: 06/03/2019; Time: 8:36 AM

## 2019-06-07 ENCOUNTER — Ambulatory Visit: Payer: Medicaid Other | Admitting: Student in an Organized Health Care Education/Training Program

## 2019-06-22 ENCOUNTER — Other Ambulatory Visit: Payer: Self-pay | Admitting: Neurosurgery

## 2019-06-29 ENCOUNTER — Other Ambulatory Visit: Payer: Self-pay

## 2019-06-29 ENCOUNTER — Encounter
Admission: RE | Admit: 2019-06-29 | Discharge: 2019-06-29 | Disposition: A | Discharge status: Home / Self Care | Payer: Medicaid Other | Source: Ambulatory Visit | Attending: Neurosurgery | Admitting: Neurosurgery

## 2019-06-29 HISTORY — DX: Headache, unspecified: R51.9

## 2019-06-29 NOTE — Patient Instructions (Signed)
Your procedure is scheduled on: Monday July 05, 2019 Report to Day Surgery. To find out your arrival time please call [phone] between 1PM - 3PM on Friday July 02, 2019.  Remember: Instructions that are not followed completely may result in serious medical risk,  up to and including death, or upon the discretion of your surgeon and anesthesiologist your  surgery may need to be rescheduled.     _X__ 1. Do not eat food after midnight the night before your procedure.                 No gum chewing or hard candies. You may drink clear liquids up to 2 hours                 before you are scheduled to arrive for your surgery- DO not drink clear                 liquids within 2 hours of the start of your surgery.                 Clear Liquids include:  water, apple juice without pulp, clear Gatorade, G2 or                  Gatorade Zero (avoid Red/Purple/Blue), Black Coffee or Tea (Do not add                 anything to coffee or tea).   __X__2.  On the morning of surgery brush your teeth with toothpaste and water, you                may rinse your mouth with mouthwash if you wish.  Do not swallow any toothpaste of mouthwash.     _X__ 3.  No Alcohol for 24 hours before or after surgery.   _X__ 4.  Do Not Smoke or use e-cigarettes For 24 Hours Prior to Your Surgery.                 Do not use any chewable tobacco products for at least 6 hours prior to                 surgery.   __x__  6.  Notify your doctor if there is any change in your medical condition      (cold, fever, infections).     Do not wear jewelry, make-up, hairpins, clips or nail polish. Do not wear lotions, powders, or perfumes. You may wear deodorant. Do not shave 48 hours prior to surgery. Men may shave face and neck. Do not bring valuables to the hospital.    [facility] is not responsible for any belongings or valuables.  Contacts, dentures or bridgework may not be worn into surgery. Leave  your suitcase in the car. After surgery it may be brought to your room. For patients admitted to the hospital, discharge time is determined by your treatment team.   Patients discharged the day of surgery will not be allowed to drive home.   Make arrangements for someone to be with you for the first 24 hours of your Same Day Discharge.    __x__ Take these medicines the morning of surgery with A SIP OF WATER:    1. acetaminophen (TYLENOL  2. buPROPion (WELLBUTRIN SR)  3. morphine (MS CONTIN  4.escitalopram (LEXAPRO)   5. methocarbamol (ROBAXIN  6. pregabalin (LYRICA)  7.rizatriptan (MAXALT) (if needed)     __x__ Use CHG Soap as directed  __x__ Use inhalers on the day of surgery  __x__ Stop Anti-inflammatories such as ibuprofen, Aleve, naproxen, aspirin and or BC powders     __x__ Stop supplements until after surgery.  __x__ Do not start any herbal supplements before your surgery.

## 2019-07-01 ENCOUNTER — Other Ambulatory Visit: Payer: Self-pay

## 2019-07-01 ENCOUNTER — Encounter
Admission: RE | Admit: 2019-07-01 | Discharge: 2019-07-01 | Disposition: A | Discharge status: Home / Self Care | Payer: Medicaid Other | Source: Ambulatory Visit | Attending: Neurosurgery | Admitting: Neurosurgery

## 2019-07-01 DIAGNOSIS — Z01812 Encounter for preprocedural laboratory examination: Secondary | ICD-10-CM | POA: Insufficient documentation

## 2019-07-01 DIAGNOSIS — Z20822 Contact with and (suspected) exposure to covid-19: Secondary | ICD-10-CM | POA: Insufficient documentation

## 2019-07-01 LAB — CBC
HCT: 40.8 % (ref 36.0–46.0)
Hemoglobin: 13.1 g/dL (ref 12.0–15.0)
MCH: 30.3 pg (ref 26.0–34.0)
MCHC: 32.1 g/dL (ref 30.0–36.0)
MCV: 94.2 fL (ref 80.0–100.0)
Platelets: 170 10*3/uL (ref 150–400)
RBC: 4.33 MIL/uL (ref 3.87–5.11)
RDW: 13.7 % (ref 11.5–15.5)
WBC: 6.6 10*3/uL (ref 4.0–10.5)
nRBC: 0 % (ref 0.0–0.2)

## 2019-07-01 LAB — URINALYSIS, ROUTINE W REFLEX MICROSCOPIC
Bilirubin Urine: NEGATIVE
Glucose, UA: NEGATIVE mg/dL
Hgb urine dipstick: NEGATIVE
Ketones, ur: NEGATIVE mg/dL
Leukocytes,Ua: NEGATIVE
Nitrite: NEGATIVE
Protein, ur: NEGATIVE mg/dL
Specific Gravity, Urine: 1.015 (ref 1.005–1.030)
pH: 7 (ref 5.0–8.0)

## 2019-07-01 LAB — PROTIME-INR
INR: 1 (ref 0.8–1.2)
Prothrombin Time: 12.9 seconds (ref 11.4–15.2)

## 2019-07-01 LAB — TYPE AND SCREEN
ABO/RH(D): O NEG
Antibody Screen: NEGATIVE

## 2019-07-01 LAB — BASIC METABOLIC PANEL
Anion gap: 11 (ref 5–15)
BUN: 18 mg/dL (ref 6–20)
CO2: 27 mmol/L (ref 22–32)
Calcium: 9.1 mg/dL (ref 8.9–10.3)
Chloride: 101 mmol/L (ref 98–111)
Creatinine, Ser: 1.31 mg/dL — ABNORMAL HIGH (ref 0.44–1.00)
GFR calc Af Amer: 52 mL/min — ABNORMAL LOW (ref >60)
GFR calc non Af Amer: 45 mL/min — ABNORMAL LOW (ref >60)
Glucose, Bld: 84 mg/dL (ref 70–99)
Potassium: 4.5 mmol/L (ref 3.5–5.1)
Sodium: 139 mmol/L (ref 135–145)

## 2019-07-01 LAB — SURGICAL PCR SCREEN
MRSA, PCR: NEGATIVE
Staphylococcus aureus: POSITIVE — AB

## 2019-07-01 LAB — APTT: aPTT: 33 seconds (ref 24–36)

## 2019-07-01 LAB — SARS CORONAVIRUS 2 (TAT 6-24 HRS): SARS Coronavirus 2: NEGATIVE

## 2019-07-05 ENCOUNTER — Inpatient Hospital Stay: Payer: Medicaid Other | Admitting: Certified Registered"

## 2019-07-05 ENCOUNTER — Other Ambulatory Visit: Payer: Self-pay

## 2019-07-05 ENCOUNTER — Encounter: Payer: Self-pay | Admitting: Neurosurgery

## 2019-07-05 ENCOUNTER — Inpatient Hospital Stay
Admission: RE | Admit: 2019-07-05 | Discharge: 2019-07-10 | DRG: 459 | Disposition: A | Discharge status: Home / Self Care | Payer: Medicaid Other | Attending: Neurosurgery | Admitting: Neurosurgery

## 2019-07-05 ENCOUNTER — Encounter
Admission: RE | Disposition: A | Discharge status: Home / Self Care | Payer: Self-pay | Source: Home / Self Care | Attending: Neurosurgery

## 2019-07-05 ENCOUNTER — Inpatient Hospital Stay: Payer: Medicaid Other

## 2019-07-05 DIAGNOSIS — E872 Acidosis: Secondary | ICD-10-CM | POA: Diagnosis not present

## 2019-07-05 DIAGNOSIS — G9050 Complex regional pain syndrome I, unspecified: Secondary | ICD-10-CM | POA: Diagnosis present

## 2019-07-05 DIAGNOSIS — R339 Retention of urine, unspecified: Secondary | ICD-10-CM | POA: Diagnosis not present

## 2019-07-05 DIAGNOSIS — K219 Gastro-esophageal reflux disease without esophagitis: Secondary | ICD-10-CM | POA: Diagnosis present

## 2019-07-05 DIAGNOSIS — G894 Chronic pain syndrome: Secondary | ICD-10-CM | POA: Diagnosis present

## 2019-07-05 DIAGNOSIS — M960 Pseudarthrosis after fusion or arthrodesis: Secondary | ICD-10-CM | POA: Diagnosis present

## 2019-07-05 DIAGNOSIS — E785 Hyperlipidemia, unspecified: Secondary | ICD-10-CM | POA: Diagnosis present

## 2019-07-05 DIAGNOSIS — Z9682 Presence of neurostimulator: Secondary | ICD-10-CM

## 2019-07-05 DIAGNOSIS — Z91048 Other nonmedicinal substance allergy status: Secondary | ICD-10-CM

## 2019-07-05 DIAGNOSIS — I10 Essential (primary) hypertension: Secondary | ICD-10-CM | POA: Diagnosis present

## 2019-07-05 DIAGNOSIS — G500 Trigeminal neuralgia: Secondary | ICD-10-CM | POA: Diagnosis present

## 2019-07-05 DIAGNOSIS — M4802 Spinal stenosis, cervical region: Secondary | ICD-10-CM | POA: Diagnosis present

## 2019-07-05 DIAGNOSIS — R0602 Shortness of breath: Secondary | ICD-10-CM

## 2019-07-05 DIAGNOSIS — F329 Major depressive disorder, single episode, unspecified: Secondary | ICD-10-CM | POA: Diagnosis present

## 2019-07-05 DIAGNOSIS — Z20822 Contact with and (suspected) exposure to covid-19: Secondary | ICD-10-CM | POA: Diagnosis present

## 2019-07-05 DIAGNOSIS — J9602 Acute respiratory failure with hypercapnia: Secondary | ICD-10-CM | POA: Diagnosis not present

## 2019-07-05 DIAGNOSIS — I361 Nonrheumatic tricuspid (valve) insufficiency: Secondary | ICD-10-CM | POA: Diagnosis not present

## 2019-07-05 DIAGNOSIS — I248 Other forms of acute ischemic heart disease: Secondary | ICD-10-CM | POA: Diagnosis not present

## 2019-07-05 DIAGNOSIS — Z885 Allergy status to narcotic agent status: Secondary | ICD-10-CM

## 2019-07-05 DIAGNOSIS — R32 Unspecified urinary incontinence: Secondary | ICD-10-CM | POA: Diagnosis not present

## 2019-07-05 DIAGNOSIS — F419 Anxiety disorder, unspecified: Secondary | ICD-10-CM | POA: Diagnosis present

## 2019-07-05 DIAGNOSIS — G9341 Metabolic encephalopathy: Secondary | ICD-10-CM | POA: Diagnosis present

## 2019-07-05 DIAGNOSIS — G92 Toxic encephalopathy: Secondary | ICD-10-CM | POA: Diagnosis not present

## 2019-07-05 DIAGNOSIS — Z972 Presence of dental prosthetic device (complete) (partial): Secondary | ICD-10-CM

## 2019-07-05 DIAGNOSIS — M50023 Cervical disc disorder at C6-C7 level with myelopathy: Secondary | ICD-10-CM | POA: Diagnosis present

## 2019-07-05 DIAGNOSIS — K259 Gastric ulcer, unspecified as acute or chronic, without hemorrhage or perforation: Secondary | ICD-10-CM | POA: Diagnosis present

## 2019-07-05 DIAGNOSIS — K589 Irritable bowel syndrome without diarrhea: Secondary | ICD-10-CM | POA: Diagnosis present

## 2019-07-05 DIAGNOSIS — Z981 Arthrodesis status: Secondary | ICD-10-CM | POA: Diagnosis not present

## 2019-07-05 DIAGNOSIS — Z419 Encounter for procedure for purposes other than remedying health state, unspecified: Secondary | ICD-10-CM

## 2019-07-05 DIAGNOSIS — Z8249 Family history of ischemic heart disease and other diseases of the circulatory system: Secondary | ICD-10-CM

## 2019-07-05 DIAGNOSIS — J9601 Acute respiratory failure with hypoxia: Secondary | ICD-10-CM | POA: Diagnosis present

## 2019-07-05 DIAGNOSIS — M1990 Unspecified osteoarthritis, unspecified site: Secondary | ICD-10-CM | POA: Diagnosis present

## 2019-07-05 DIAGNOSIS — Z79899 Other long term (current) drug therapy: Secondary | ICD-10-CM

## 2019-07-05 DIAGNOSIS — Z85528 Personal history of other malignant neoplasm of kidney: Secondary | ICD-10-CM

## 2019-07-05 DIAGNOSIS — G959 Disease of spinal cord, unspecified: Secondary | ICD-10-CM | POA: Diagnosis present

## 2019-07-05 DIAGNOSIS — M797 Fibromyalgia: Secondary | ICD-10-CM | POA: Diagnosis present

## 2019-07-05 DIAGNOSIS — T50905A Adverse effect of unspecified drugs, medicaments and biological substances, initial encounter: Secondary | ICD-10-CM | POA: Diagnosis not present

## 2019-07-05 DIAGNOSIS — R112 Nausea with vomiting, unspecified: Secondary | ICD-10-CM

## 2019-07-05 DIAGNOSIS — J9811 Atelectasis: Secondary | ICD-10-CM | POA: Diagnosis not present

## 2019-07-05 DIAGNOSIS — Z888 Allergy status to other drugs, medicaments and biological substances status: Secondary | ICD-10-CM

## 2019-07-05 DIAGNOSIS — E162 Hypoglycemia, unspecified: Secondary | ICD-10-CM | POA: Diagnosis not present

## 2019-07-05 DIAGNOSIS — Z8551 Personal history of malignant neoplasm of bladder: Secondary | ICD-10-CM

## 2019-07-05 DIAGNOSIS — Z8711 Personal history of peptic ulcer disease: Secondary | ICD-10-CM

## 2019-07-05 DIAGNOSIS — I340 Nonrheumatic mitral (valve) insufficiency: Secondary | ICD-10-CM | POA: Diagnosis not present

## 2019-07-05 HISTORY — PX: POSTERIOR CERVICAL FUSION/FORAMINOTOMY: SHX5038

## 2019-07-05 HISTORY — PX: POSTERIOR CERVICAL LAMINECTOMY: SHX2248

## 2019-07-05 LAB — HEMOGLOBIN A1C
Hgb A1c MFr Bld: 5 % (ref 4.8–5.6)
Mean Plasma Glucose: 96.8 mg/dL

## 2019-07-05 LAB — GLUCOSE, CAPILLARY
Glucose-Capillary: 156 mg/dL — ABNORMAL HIGH (ref 70–99)
Glucose-Capillary: 193 mg/dL — ABNORMAL HIGH (ref 70–99)

## 2019-07-05 LAB — ABO/RH: ABO/RH(D): O NEG

## 2019-07-05 SURGERY — POSTERIOR CERVICAL FUSION/FORAMINOTOMY LEVEL 4
Anesthesia: General | Laterality: Bilateral

## 2019-07-05 MED ORDER — ONDANSETRON HCL 4 MG PO TABS: 4.0000 mg | ORAL_TABLET | Freq: Four times a day (QID) | ORAL | Status: DC | PRN

## 2019-07-05 MED ORDER — PROPOFOL 500 MG/50ML IV EMUL
INTRAVENOUS | Status: AC
  Filled 2019-07-05: qty 50

## 2019-07-05 MED ORDER — HYDROMORPHONE HCL 1 MG/ML IJ SOLN
0.5000 mg | INTRAMUSCULAR | Status: DC | PRN
  Administered 2019-07-05 – 2019-07-06 (×3): 0.5 mg via INTRAVENOUS
  Filled 2019-07-05 (×3): qty 1

## 2019-07-05 MED ORDER — SUMATRIPTAN SUCCINATE 50 MG PO TABS
50.0000 mg | ORAL_TABLET | Freq: Once | ORAL | Status: AC
  Administered 2019-07-05: 18:00:00 50 mg via ORAL
  Filled 2019-07-05: qty 1

## 2019-07-05 MED ORDER — DEXAMETHASONE SODIUM PHOSPHATE 10 MG/ML IJ SOLN
INTRAMUSCULAR | Status: DC | PRN
  Administered 2019-07-05: 10 mg via INTRAVENOUS

## 2019-07-05 MED ORDER — ACETAMINOPHEN 325 MG PO TABS
650.0000 mg | ORAL_TABLET | ORAL | Status: DC | PRN
  Administered 2019-07-07: 650 mg via ORAL
  Filled 2019-07-05: qty 2

## 2019-07-05 MED ORDER — ONDANSETRON HCL 4 MG/2ML IJ SOLN
INTRAMUSCULAR | Status: AC
  Filled 2019-07-05: qty 2

## 2019-07-05 MED ORDER — SODIUM CHLORIDE 0.9 % IV SOLN: INTRAVENOUS | Status: DC | PRN

## 2019-07-05 MED ORDER — SODIUM CHLORIDE 0.9 % IV SOLN: INTRAVENOUS | Status: DC

## 2019-07-05 MED ORDER — ONDANSETRON HCL 4 MG/2ML IJ SOLN
INTRAMUSCULAR | Status: DC | PRN
  Administered 2019-07-05: 4 mg via INTRAVENOUS

## 2019-07-05 MED ORDER — POLYETHYLENE GLYCOL 3350 17 G PO PACK: 17.0000 g | PACK | Freq: Every day | ORAL | Status: DC | PRN

## 2019-07-05 MED ORDER — LIDOCAINE-EPINEPHRINE 1 %-1:100000 IJ SOLN
INTRAMUSCULAR | Status: AC
  Filled 2019-07-05: qty 1

## 2019-07-05 MED ORDER — EPHEDRINE SULFATE 50 MG/ML IJ SOLN
INTRAMUSCULAR | Status: DC | PRN
  Administered 2019-07-05 (×2): 10 mg via INTRAVENOUS
  Administered 2019-07-05: 5 mg via INTRAVENOUS
  Administered 2019-07-05: 10 mg via INTRAVENOUS

## 2019-07-05 MED ORDER — ACETAMINOPHEN 500 MG PO TABS
1000.0000 mg | ORAL_TABLET | Freq: Four times a day (QID) | ORAL | Status: AC
  Administered 2019-07-05 – 2019-07-06 (×4): 1000 mg via ORAL
  Filled 2019-07-05 (×4): qty 2

## 2019-07-05 MED ORDER — PHENYLEPHRINE HCL-NACL 10-0.9 MG/250ML-% IV SOLN
INTRAVENOUS | Status: DC | PRN
  Administered 2019-07-05: 50 ug/min via INTRAVENOUS

## 2019-07-05 MED ORDER — PHENOL 1.4 % MT LIQD
1.0000 | OROMUCOSAL | Status: DC | PRN
  Filled 2019-07-05: qty 177

## 2019-07-05 MED ORDER — MIDAZOLAM HCL 2 MG/2ML IJ SOLN
INTRAMUSCULAR | Status: DC | PRN
  Administered 2019-07-05: 2 mg via INTRAVENOUS

## 2019-07-05 MED ORDER — SUCCINYLCHOLINE CHLORIDE 20 MG/ML IJ SOLN
INTRAMUSCULAR | Status: DC | PRN
  Administered 2019-07-05: 50 mg via INTRAVENOUS

## 2019-07-05 MED ORDER — MIDAZOLAM HCL 2 MG/2ML IJ SOLN
INTRAMUSCULAR | Status: AC
  Filled 2019-07-05: qty 2

## 2019-07-05 MED ORDER — BACITRACIN 500 UNIT/GM EX OINT
TOPICAL_OINTMENT | CUTANEOUS | Status: DC | PRN
  Administered 2019-07-05: 1 via TOPICAL

## 2019-07-05 MED ORDER — THROMBIN 5000 UNITS EX SOLR
CUTANEOUS | Status: DC | PRN
  Administered 2019-07-05: 5000 [IU] via TOPICAL

## 2019-07-05 MED ORDER — PREGABALIN 50 MG PO CAPS
50.0000 mg | ORAL_CAPSULE | Freq: Three times a day (TID) | ORAL | Status: DC
  Administered 2019-07-05: 50 mg via ORAL
  Filled 2019-07-05: qty 1

## 2019-07-05 MED ORDER — CEFAZOLIN SODIUM-DEXTROSE 2-4 GM/100ML-% IV SOLN
2.0000 g | Freq: Once | INTRAVENOUS | Status: AC
  Administered 2019-07-05: 2 g via INTRAVENOUS

## 2019-07-05 MED ORDER — DICYCLOMINE HCL 20 MG PO TABS
20.0000 mg | ORAL_TABLET | Freq: Two times a day (BID) | ORAL | Status: DC
  Administered 2019-07-05 – 2019-07-10 (×10): 20 mg via ORAL
  Filled 2019-07-05 (×12): qty 1

## 2019-07-05 MED ORDER — FENTANYL CITRATE (PF) 100 MCG/2ML IJ SOLN
INTRAMUSCULAR | Status: AC
  Filled 2019-07-05: qty 2

## 2019-07-05 MED ORDER — CEFAZOLIN SODIUM-DEXTROSE 2-4 GM/100ML-% IV SOLN
INTRAVENOUS | Status: AC
  Filled 2019-07-05: qty 100

## 2019-07-05 MED ORDER — PROPOFOL 10 MG/ML IV BOLUS
INTRAVENOUS | Status: AC
  Filled 2019-07-05: qty 20

## 2019-07-05 MED ORDER — REMIFENTANIL HCL 1 MG IV SOLR
INTRAVENOUS | Status: AC
  Filled 2019-07-05: qty 1000

## 2019-07-05 MED ORDER — REMIFENTANIL HCL 1 MG IV SOLR
INTRAVENOUS | Status: DC | PRN
  Administered 2019-07-05: 100 ug via INTRAVENOUS
  Administered 2019-07-05: 50 ug via INTRAVENOUS
  Administered 2019-07-05: .1 ug/kg/min via INTRAVENOUS

## 2019-07-05 MED ORDER — TRAZODONE HCL 100 MG PO TABS
100.0000 mg | ORAL_TABLET | Freq: Every day | ORAL | Status: DC
  Administered 2019-07-05 – 2019-07-09 (×5): 100 mg via ORAL
  Filled 2019-07-05 (×5): qty 1

## 2019-07-05 MED ORDER — FAMOTIDINE 20 MG PO TABS
ORAL_TABLET | ORAL | Status: AC
  Administered 2019-07-05: 20 mg via ORAL
  Filled 2019-07-05: qty 1

## 2019-07-05 MED ORDER — BACITRACIN ZINC 500 UNIT/GM EX OINT
TOPICAL_OINTMENT | CUTANEOUS | Status: AC
  Filled 2019-07-05: qty 28.35

## 2019-07-05 MED ORDER — LIDOCAINE-EPINEPHRINE 1 %-1:100000 IJ SOLN
INTRAMUSCULAR | Status: DC | PRN
  Administered 2019-07-05: 10 mL

## 2019-07-05 MED ORDER — SODIUM CHLORIDE 0.9 % IV SOLN
INTRAVENOUS | Status: DC | PRN
  Administered 2019-07-05: 40 mL

## 2019-07-05 MED ORDER — SENNA 8.6 MG PO TABS
1.0000 | ORAL_TABLET | Freq: Two times a day (BID) | ORAL | Status: DC
  Administered 2019-07-05 – 2019-07-07 (×4): 8.6 mg via ORAL
  Filled 2019-07-05 (×7): qty 1

## 2019-07-05 MED ORDER — MENTHOL 3 MG MT LOZG
1.0000 | LOZENGE | OROMUCOSAL | Status: DC | PRN
  Filled 2019-07-05: qty 9

## 2019-07-05 MED ORDER — PROPOFOL 500 MG/50ML IV EMUL
INTRAVENOUS | Status: DC | PRN
  Administered 2019-07-05: 170 mg via INTRAVENOUS
  Administered 2019-07-05: 125 ug/kg/min via INTRAVENOUS
  Administered 2019-07-05: 30 mg via INTRAVENOUS

## 2019-07-05 MED ORDER — BUPROPION HCL ER (SR) 150 MG PO TB12
150.0000 mg | ORAL_TABLET | Freq: Two times a day (BID) | ORAL | Status: DC
  Administered 2019-07-05 – 2019-07-07 (×4): 150 mg via ORAL
  Filled 2019-07-05 (×6): qty 1

## 2019-07-05 MED ORDER — ACETAMINOPHEN 650 MG RE SUPP: 650.0000 mg | RECTAL | Status: DC | PRN

## 2019-07-05 MED ORDER — ONDANSETRON HCL 4 MG/2ML IJ SOLN
4.0000 mg | Freq: Four times a day (QID) | INTRAMUSCULAR | Status: DC | PRN
  Administered 2019-07-08 – 2019-07-09 (×3): 4 mg via INTRAVENOUS
  Filled 2019-07-05 (×3): qty 2

## 2019-07-05 MED ORDER — FENTANYL CITRATE (PF) 100 MCG/2ML IJ SOLN
INTRAMUSCULAR | Status: AC
  Administered 2019-07-05: 25 ug via INTRAVENOUS
  Filled 2019-07-05: qty 2

## 2019-07-05 MED ORDER — BUPIVACAINE HCL (PF) 0.5 % IJ SOLN
INTRAMUSCULAR | Status: DC | PRN
  Administered 2019-07-05: 20 mL

## 2019-07-05 MED ORDER — VANCOMYCIN HCL 1000 MG IV SOLR
INTRAVENOUS | Status: DC | PRN
  Administered 2019-07-05: 1000 mg via TOPICAL

## 2019-07-05 MED ORDER — METHOCARBAMOL 1000 MG/10ML IJ SOLN
500.0000 mg | Freq: Four times a day (QID) | INTRAVENOUS | Status: DC
  Administered 2019-07-05: 500 mg via INTRAVENOUS
  Filled 2019-07-05 (×13): qty 5

## 2019-07-05 MED ORDER — INSULIN ASPART 100 UNIT/ML ~~LOC~~ SOLN: 0.0000 [IU] | Freq: Three times a day (TID) | SUBCUTANEOUS | Status: DC

## 2019-07-05 MED ORDER — MAGNESIUM CITRATE PO SOLN
1.0000 | Freq: Once | ORAL | Status: DC | PRN
  Filled 2019-07-05: qty 296

## 2019-07-05 MED ORDER — SUCRALFATE 1 G PO TABS
1.0000 g | ORAL_TABLET | Freq: Two times a day (BID) | ORAL | Status: DC
  Administered 2019-07-05 – 2019-07-10 (×10): 1 g via ORAL
  Filled 2019-07-05 (×10): qty 1

## 2019-07-05 MED ORDER — METHOCARBAMOL 500 MG PO TABS
1000.0000 mg | ORAL_TABLET | Freq: Four times a day (QID) | ORAL | Status: DC
  Administered 2019-07-05 – 2019-07-07 (×7): 1000 mg via ORAL
  Filled 2019-07-05 (×10): qty 2

## 2019-07-05 MED ORDER — SODIUM CHLORIDE 0.9% FLUSH: 3.0000 mL | INTRAVENOUS | Status: DC | PRN

## 2019-07-05 MED ORDER — FENTANYL CITRATE (PF) 100 MCG/2ML IJ SOLN
25.0000 ug | INTRAMUSCULAR | Status: DC | PRN
  Administered 2019-07-05 (×4): 25 ug via INTRAVENOUS

## 2019-07-05 MED ORDER — MORPHINE SULFATE ER 30 MG PO TBCR
90.0000 mg | EXTENDED_RELEASE_TABLET | Freq: Two times a day (BID) | ORAL | Status: DC
  Administered 2019-07-05 – 2019-07-06 (×3): 90 mg via ORAL
  Filled 2019-07-05 (×2): qty 3

## 2019-07-05 MED ORDER — BACITRACIN 50000 UNITS IM SOLR
INTRAMUSCULAR | Status: AC
  Filled 2019-07-05: qty 1

## 2019-07-05 MED ORDER — BISACODYL 5 MG PO TBEC: 5.0000 mg | DELAYED_RELEASE_TABLET | Freq: Every day | ORAL | Status: DC | PRN

## 2019-07-05 MED ORDER — FENTANYL CITRATE (PF) 100 MCG/2ML IJ SOLN
INTRAMUSCULAR | Status: DC | PRN
  Administered 2019-07-05: 50 ug via INTRAVENOUS

## 2019-07-05 MED ORDER — ALBUTEROL SULFATE (2.5 MG/3ML) 0.083% IN NEBU: 2.5000 mg | INHALATION_SOLUTION | RESPIRATORY_TRACT | Status: DC | PRN

## 2019-07-05 MED ORDER — THROMBIN 5000 UNITS EX SOLR
CUTANEOUS | Status: AC
  Filled 2019-07-05: qty 5000

## 2019-07-05 MED ORDER — ESCITALOPRAM OXALATE 10 MG PO TABS
20.0000 mg | ORAL_TABLET | Freq: Every day | ORAL | Status: DC
  Administered 2019-07-06 – 2019-07-07 (×2): 20 mg via ORAL
  Filled 2019-07-05 (×4): qty 2

## 2019-07-05 MED ORDER — LIDOCAINE HCL (CARDIAC) PF 100 MG/5ML IV SOSY
PREFILLED_SYRINGE | INTRAVENOUS | Status: DC | PRN
  Administered 2019-07-05: 100 mg via INTRAVENOUS

## 2019-07-05 MED ORDER — ROCURONIUM BROMIDE 100 MG/10ML IV SOLN
INTRAVENOUS | Status: DC | PRN
  Administered 2019-07-05: 5 mg via INTRAVENOUS

## 2019-07-05 MED ORDER — FAMOTIDINE 20 MG PO TABS: 20.0000 mg | ORAL_TABLET | Freq: Once | ORAL | Status: AC

## 2019-07-05 MED ORDER — ONDANSETRON HCL 4 MG/2ML IJ SOLN
4.0000 mg | Freq: Once | INTRAMUSCULAR | Status: AC | PRN
  Administered 2019-07-05: 4 mg via INTRAVENOUS

## 2019-07-05 MED ORDER — SODIUM CHLORIDE 0.9 % IV SOLN
250.0000 mL | INTRAVENOUS | Status: DC
  Administered 2019-07-05: 250 mL via INTRAVENOUS

## 2019-07-05 MED ORDER — LACTATED RINGERS IV SOLN: INTRAVENOUS | Status: DC

## 2019-07-05 MED ORDER — SODIUM CHLORIDE 0.9% FLUSH
3.0000 mL | Freq: Two times a day (BID) | INTRAVENOUS | Status: DC
  Administered 2019-07-05 – 2019-07-10 (×10): 3 mL via INTRAVENOUS

## 2019-07-05 SURGICAL SUPPLY — 91 items
BIT DRILL 13 (BIT) IMPLANT
BIT DRILL 2.4 F/3.5 SCRW (BIT) ×2 IMPLANT
BLADE BOVIE TIP EXT 4 (BLADE) ×2 IMPLANT
BLADE SURG 15 STRL LF DISP TIS (BLADE) ×1 IMPLANT
BLADE SURG 15 STRL SS (BLADE) ×1
BONE CANC CHIPS 20CC PCAN1/4 (Bone Implant) ×2 IMPLANT
BUR DIAMOND COARSE 4.0 RND (BURR) ×2 IMPLANT
BUR NEURO DRILL SOFT 3.0X3.8M (BURR) ×2 IMPLANT
BUR SABER DIAMOND 3.0 (BURR) ×2 IMPLANT
CANISTER SUCT 1200ML W/VALVE (MISCELLANEOUS) ×2 IMPLANT
CHIPS CANC BONE 20CC PCAN1/4 (Bone Implant) ×1 IMPLANT
CHLORAPREP W/TINT 26 (MISCELLANEOUS) ×4 IMPLANT
COLLAR CERV MED MED DENS 3 (SOFTGOODS) IMPLANT
COLLAR CERV SM MED DENS 3 (SOFTGOODS) IMPLANT
COLLAR CERV XL MED DENS 3 (SOFTGOODS) IMPLANT
COUNTER NEEDLE 20/40 LG (NEEDLE) ×2 IMPLANT
COVER LIGHT HANDLE STERIS (MISCELLANEOUS) ×4 IMPLANT
COVER WAND RF STERILE (DRAPES) ×2 IMPLANT
CRADLE LAMINECT ARM (MISCELLANEOUS) ×2 IMPLANT
CUP MEDICINE 2OZ PLAST GRAD ST (MISCELLANEOUS) ×4 IMPLANT
DERMABOND ADVANCED (GAUZE/BANDAGES/DRESSINGS) ×1
DERMABOND ADVANCED .7 DNX12 (GAUZE/BANDAGES/DRESSINGS) ×1 IMPLANT
DRAPE 3/4 80X56 (DRAPES) ×6 IMPLANT
DRAPE C-ARM 42X72 X-RAY (DRAPES) ×4 IMPLANT
DRAPE INCISE IOBAN 66X45 STRL (DRAPES) ×2 IMPLANT
DRAPE MICROSCOPE SPINE 48X150 (DRAPES) ×2 IMPLANT
DRAPE POUCH INSTRU U-SHP 10X18 (DRAPES) ×2 IMPLANT
DRAPE SURG 17X11 SM STRL (DRAPES) ×4 IMPLANT
DRAPE THYROID T SHEET (DRAPES) ×2 IMPLANT
DRSG OPSITE POSTOP 4X6 (GAUZE/BANDAGES/DRESSINGS) IMPLANT
DRSG OPSITE POSTOP 4X8 (GAUZE/BANDAGES/DRESSINGS) IMPLANT
DRSG TEGADERM 4X4.75 (GAUZE/BANDAGES/DRESSINGS) ×4 IMPLANT
DRSG TELFA 3X8 NADH (GAUZE/BANDAGES/DRESSINGS) ×4 IMPLANT
ELECT CAUTERY BLADE TIP 2.5 (TIP) ×2
ELECT EZSTD 165MM 6.5IN (MISCELLANEOUS) ×2
ELECTRODE CAUTERY BLDE TIP 2.5 (TIP) ×1 IMPLANT
ELECTRODE EZSTD 165MM 6.5IN (MISCELLANEOUS) ×1 IMPLANT
FEE INTRAOP MONITOR IMPULS NCS (MISCELLANEOUS) ×1 IMPLANT
GAUZE SPONGE 4X4 12PLY STRL (GAUZE/BANDAGES/DRESSINGS) ×2 IMPLANT
GAUZE XEROFORM 1X8 LF (GAUZE/BANDAGES/DRESSINGS) ×2 IMPLANT
GLOVE BIOGEL PI IND STRL 7.0 (GLOVE) ×1 IMPLANT
GLOVE BIOGEL PI INDICATOR 7.0 (GLOVE) ×1
GLOVE INDICATOR 7.0 STRL GRN (GLOVE) ×2 IMPLANT
GLOVE INDICATOR 8.0 STRL GRN (GLOVE) ×2 IMPLANT
GLOVE SURG SYN 7.0 (GLOVE) ×4 IMPLANT
GLOVE SURG SYN 8.0 (GLOVE) ×4 IMPLANT
GOWN STRL REUS W/ TWL XL LVL3 (GOWN DISPOSABLE) ×1 IMPLANT
GOWN STRL REUS W/TWL MED LVL3 (GOWN DISPOSABLE) ×2 IMPLANT
GOWN STRL REUS W/TWL XL LVL3 (GOWN DISPOSABLE) ×1
GRADUATE 1200CC STRL 31836 (MISCELLANEOUS) ×2 IMPLANT
HEMOSTAT SURGICEL 2X3 (HEMOSTASIS) IMPLANT
HEMOVAC 400CC 10FR (MISCELLANEOUS) ×2 IMPLANT
INTRAOP MONITOR FEE IMPULS NCS (MISCELLANEOUS) ×1
INTRAOP MONITOR FEE IMPULSE (MISCELLANEOUS) ×1
IV CATH ANGIO 12GX3 LT BLUE (NEEDLE) ×2 IMPLANT
KIT TURNOVER KIT A (KITS) ×2 IMPLANT
LAPSAC SURG PACK 8X10 (MISCELLANEOUS) ×2
MARKER SKIN DUAL TIP RULER LAB (MISCELLANEOUS) ×6 IMPLANT
NEEDLE HYPO 21X1.5 SAFETY (NEEDLE) ×2 IMPLANT
NEEDLE HYPO 22GX1.5 SAFETY (NEEDLE) ×2 IMPLANT
NEEDLE SPNL 22GX3.5 QUINCKE BK (NEEDLE) ×2 IMPLANT
NS IRRIG 1000ML POUR BTL (IV SOLUTION) ×2 IMPLANT
PACK LAMINECTOMY NEURO (CUSTOM PROCEDURE TRAY) ×2 IMPLANT
PACK SURG LAPSAC 8X10 (MISCELLANEOUS) ×1 IMPLANT
PIN CASPAR 14 (PIN) ×1 IMPLANT
PIN CASPAR 14MM (PIN) ×2
PIN MAYFIELD SKULL DISP (PIN) ×2 IMPLANT
PUTTY DBX 10CC (Bone Implant) ×4 IMPLANT
ROD LOR TI PREBENT 4.0X65 (Rod) ×4 IMPLANT
SCREW CFX 4.0 PLY 4.5X26 (Screw) ×4 IMPLANT
SCREW CFX 4.0 PLY 4.5X28 (Screw) ×4 IMPLANT
SCREW POLY 3.5X12 FOR 4 ROD (Screw) ×8 IMPLANT
SCREW SET SPINAL (Screw) ×16 IMPLANT
SPOGE SURGIFLO 8M (HEMOSTASIS) ×1
SPONGE KITTNER 5P (MISCELLANEOUS) IMPLANT
SPONGE SURGIFLO 8M (HEMOSTASIS) ×1 IMPLANT
STAPLER SKIN PROX 35W (STAPLE) ×2 IMPLANT
SUT BONE WAX W31G (SUTURE) ×2 IMPLANT
SUT ETHILON 3 0 PS 1 (SUTURE) ×8 IMPLANT
SUT NURALON 4 0 TR CR/8 (SUTURE) ×2 IMPLANT
SUT POLYSORB 2-0 5X18 GS-10 (SUTURE) ×8 IMPLANT
SUT PROLENE 5 0 RB 2 (SUTURE) IMPLANT
SUT VIC AB 0 CT1 18XCR BRD 8 (SUTURE) ×1 IMPLANT
SUT VIC AB 0 CT1 8-18 (SUTURE) ×1
SUT VICRYL 2-0 SH 8X27 (SUTURE) ×2 IMPLANT
SUT VICRYL 3-0 CR8 SH (SUTURE) ×2 IMPLANT
SYR 30ML LL (SYRINGE) ×6 IMPLANT
TAPE CLOTH 3X10 WHT NS LF (GAUZE/BANDAGES/DRESSINGS) ×4 IMPLANT
TOWEL OR 17X26 4PK STRL BLUE (TOWEL DISPOSABLE) ×4 IMPLANT
TRAY FOLEY MTR SLVR 16FR STAT (SET/KITS/TRAYS/PACK) ×2 IMPLANT
TUBING CONNECTING 10 (TUBING) ×2 IMPLANT

## 2019-07-05 NOTE — Anesthesia Procedure Notes (Signed)
Procedure Name: Intubation Date/Time: 07/05/2019 10:15 AM Performed by: Natasha Mead, CRNA Pre-anesthesia Checklist: Patient identified, Suction available, Emergency Drugs available, Patient being monitored and Timeout performed Patient Re-evaluated:Patient Re-evaluated prior to induction Oxygen Delivery Method: Circle system utilized Preoxygenation: Pre-oxygenation with 100% oxygen Induction Type: IV induction Ventilation: Mask ventilation without difficulty Laryngoscope Size: Miller and 2 Grade View: Grade I Tube type: Oral Tube size: 7.0 mm Number of attempts: 1 Airway Equipment and Method: Rigid stylet Placement Confirmation: ETT inserted through vocal cords under direct vision,  positive ETCO2 and breath sounds checked- equal and bilateral Secured at: 21 cm Tube secured with: Tape Dental Injury: Teeth and Oropharynx as per pre-operative assessment

## 2019-07-05 NOTE — H&P (Signed)
Lindsay Austin is an 58 y.o. female.   Chief Complaint: Hand numbness and weakness HPI: Ms. Sawinski is here for evaluation of ongoing symptoms in her upper extremities consisting of numbness and fine motor difficulty. She states that she is also having some difficulty now with her legs and numbness there and balance. She is using a cane for ambulation. The symptoms in her arms have been worsening and she feels is interfering with her ability to conduct her daily lifestyle. She also notes some pain that would go into the shoulders bilaterally. She still notes there is significant amount of pain that goes from the neck into the bottom of the scalp and also between the shoulder blades. She notes inability to fully move her neck going on given the pain. She has undergone previous anterior cervical fusion and does still have some mild swallowing problems after this.   Past Medical History:  Diagnosis Date  . Anxiety   . Arthritis   . Bladder cancer ([place])   . Cancer of kidney ([place])   . DDD (degenerative disc disease), cervical   . Depression   . Failed back syndrome   . Fibromyalgia   . GERD (gastroesophageal reflux disease)   . Headache    migraines   . Heart murmur   . Hyperlipemia   . Hypertension    not on any medications  . IBS (irritable bowel syndrome)   . Kidney failure   . Renal insufficiency   . RSD (reflex sympathetic dystrophy)   . Seizure ([place])   . Stomach ulcer   . Trigeminal neuralgia     Past Surgical History:  Procedure Laterality Date  . BACK SURGERY  1995   x 7  . BRAIN SURGERY  2004  . CHOLECYSTECTOMY    . colonscopy  2006  . NEPHROURETERECTOMY  2011  . SPINAL CORD STIMULATOR IMPLANT  2004  . TRANSURETHRAL RESECTION OF BLADDER TUMOR WITH GYRUS (TURBT-GYRUS)  2012, 2013  . trigeminal neuralgia  2004    Family History  Problem Relation Age of Onset  . Diabetes Mother   . Heart disease Mother   . Dementia Father   . Kidney disease Father    Social History:   reports that she has never smoked. She has never used smokeless tobacco. She reports that she does not drink alcohol or use drugs.  Allergies:  Allergies  Allergen Reactions  . Methadone Palpitations  . Other Swelling and Other (See Comments)    Tricyclic antidepressants Unable to void Tongue Swelling    . Tape Other (See Comments)    Blisters  Paper tape ok Tegaderm ok     Medications Prior to Admission  Medication Sig Dispense Refill  . acetaminophen (TYLENOL) 500 MG tablet Take 2 tablets (1,000 mg total) by mouth 2 (two) times daily as needed. 100 tablet 2  . albuterol (VENTOLIN HFA) 108 (90 Base) MCG/ACT inhaler Inhale 2 puffs into the lungs every 4 (four) hours as needed for wheezing or shortness of breath.     Marland Kitchen buPROPion (WELLBUTRIN SR) 150 MG 12 hr tablet Take 150 mg by mouth 2 (two) times daily.    Marland Kitchen dicyclomine (BENTYL) 20 MG tablet Take 20 mg by mouth 2 (two) times a day.     . escitalopram (LEXAPRO) 20 MG tablet Take 20 mg by mouth daily. with food    . methocarbamol (ROBAXIN) 500 MG tablet Take 1-2 tablets (500-1,000 mg total) by mouth every 12 (twelve) hours as needed for muscle  spasms. 120 tablet 2  . morphine (MS CONTIN) 60 MG 12 hr tablet Take 1 tablet (60 mg total) by mouth every 12 (twelve) hours. Must last 30 days. 60 tablet 0  . ondansetron (ZOFRAN-ODT) 4 MG disintegrating tablet Take 4 mg by mouth every 8 (eight) hours as needed for nausea or vomiting.   2  . pregabalin (LYRICA) 50 MG capsule Take 50 mg by mouth 3 (three) times daily.     . rizatriptan (MAXALT) 10 MG tablet Take 10 mg by mouth as needed for migraine.     . sucralfate (CARAFATE) 1 g tablet Take 1 g by mouth 2 (two) times daily.    . traZODone (DESYREL) 50 MG tablet Take 100 mg by mouth at bedtime.   2  . triamcinolone cream (KENALOG) 0.1 % Apply 1 application topically 2 (two) times daily.    . Galcanezumab-gnlm (EMGALITY) 120 MG/ML SOAJ Inject 120 mg into the skin every 28 (twenty-eight) days.      Marland Kitchen lidocaine (LIDODERM) 5 % Place 1 patch onto the skin daily. Remove & Discard patch within 12 hours or as directed by MD (Patient not taking: Reported on 06/29/2019) 10 patch 0  . lidocaine (XYLOCAINE) 4 % external solution Apply topically as needed.    Derrill Memo ON 07/10/2019] morphine (MS CONTIN) 60 MG 12 hr tablet Take 1 tablet (60 mg total) by mouth every 12 (twelve) hours. Must last 30 days. 60 tablet 0  . [START ON 08/09/2019] morphine (MS CONTIN) 60 MG 12 hr tablet Take 1 tablet (60 mg total) by mouth every 12 (twelve) hours. Must last 30 days. 60 tablet 0    Results for orders placed or performed during the hospital encounter of 07/05/19 (from the past 48 hour(s))  ABO/Rh     Status: None (Preliminary result)   Collection Time: 07/05/19  8:30 AM  Result Value Ref Range   ABO/RH(D) PENDING    No results found.  Review of Systems General ROS: Negative Respiratory ROS: Negative Cardiovascular ROS: Negative Gastrointestinal ROS: Negative Genito-Urinary ROS: Negative Musculoskeletal ROS: Positive for neck pain Neurological ROS: Positive for arm pain and numbness Dermatological ROS: Negative   Blood pressure 114/87, pulse 86, temperature (!) 97 F (36.1 C), temperature source Temporal, resp. rate 16, SpO2 94 %. Physical Exam   General appearance: Alert, cooperative, in no acute distress Neck: Well-healed anterior cervical incision, tenderness to palpation of the midline and paramedian regions posteriorly CV: Regular rate and rhythm Pulm: Clear to auscultation  Mental status: alertness: alert, affect: normal Speech: fluent and clear Motor:strength symmetric 4+ out of 5 in deltoid, bicep, tricep which appear limited by pain. She has 5-5 strength in interossei, 4+ out of 5 in grip Sensory: intact to light touch in bilateral upper extremities today Reflexes: 2+ and symmetric bilaterally for biceps, patella Gait: Wide-based gait, slightly antalgic, using cane  Assessment/Plan We  will proceed with C5-T2 PCDF for cervical myelopathy and incomplete anterior fusion  Deetta Perla, MD 07/05/2019, 9:09 AM

## 2019-07-05 NOTE — Op Note (Signed)
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 Lateral Mass screws at C5/C6 3. Pedicle screw fixation at T1, T2 4. C6, C7 laminectomy  SURGEON: Surgeon(s) and Role: * Malen Gauze, MD - Primary  Marin Olp, [place], Asisstant  ANESTHESIA:General   OPERATIVE FINDINGS: Stenosis atC6/7with Pseudoarthrosis  OPERATIVE REPORT:  Indications [name] was seen in clinic on 2/23 with history of cervical stenosis at C6/7 after prior C4-7 ACDF. She has had incomplete fusion at C6/7 and symptoms of numbness and weakness in hands. Given the stenosis and concern for symptoms including neck pain, a posterior decompression and fusion was recommended. The risks of hematoma, infection, poor bone healing, failure of fusion, cord injury, weakness, numbness, neck pain, stroke, and death were discussed in detail. All questions were answered and the patient elected to proceed with the surgery.    Procedure After obtaining informed consent, the patient was taken to the Operating Room where general anesthesia was induced and the patient intubated. Vascular access was obtained including central line. Neuromonitoring electrodes had been placed and adequate baselines for MEP and SSEP obtained.  The Mayfield pins were applied, and the patient was positioned prone with neck in a neutral position with slight extension on a standard OR table with appropriate padding of pressure points. Monitoring was stable.    The patient was prepped and draped in the usual sterile fashion and a timeout was performed per protocol. Local anesthesia was instilled with epinephrine along the planned incision site. A midline  incision was performed from C4 to T2. The incision was carried to the level of the fascia and then to the lamina from C4 to T2 taking care not to disrupt the musculature and ligament adjacent to these levels. The lateral masses of C5-C7 were cleared of all tissue.   We then proceeded with placement of the C5-T2 screws. The midpoint of the lateral mass of C5-6 was established and a cortical entry site was made with a 3 mm matchstick drill bit. A drill guide was set 40mm and advanced to the superior lateral quadrant. A ball-tipped probe was passed and had no evidence of breech. A 3-0 tap was advanced and subsequently 3.5 x 8mm Depuy Symphony lateral mass screws were positioned until they were noted to be hand tight at C5, C6 bilaterally. No monitoring changes were noted once this portion of the procedure was completed. We then placed T1 and T2 pedicle screws after entry point was established along the transverse process ridge and midpoint of the facet. The gear shifts were advanced under anatomic AP imaging. Once to depth we then placed 4.5x7mm pedicle screws at T1 and 4.5x40mm at T2. No monitoring changes were noted.  Motor evoked and sensory potentials remained stable throughout without any changes.    Next, the decompression was performed. A high speed drill with 72mm bit was used to drill the lamina laterally on each side from C6 to C7 to expose the ligament. Once the central portion of bone was freed, the lamina were removed using dissection technique as one piece exposing the dura beneath. There were areas of compression laterally and these were removed with Kerrison rongeur until the dura was without compression. Hemostais was then achieved and the wound was irrigated profusely.  A 3 mm matchstick drill bit was then used to  decorticate all lateral masses and facet joints spanning C5-T2. Components of allograft and packed within each facet joint and lateral gutters spanning C5-T2. 4.39mm cut rods were placed  bilaterally and secured with set screws. Monitoring confirmed that there were no changes. Xray confirmed good placement of screws.   A medium hemovac was tunneled inferiorly through the skin. The muscle and fascia were closed with 0 vicryl suture. Exparel was placed into the soft tissue. Vancomycin powder was placed along the fascia. The subcutaneous and dermis was closed with 2-0 and 3-0 vicryl suture. The skin was closed with 3-0 Nylon. A dressing was placed.    The patient was returned to supine position and head fixation removed. The patient had general anesthesia reversed and was extubated following the procedure. She was taken to the PACU where she was at baseline strength. Following the procedure, I spoke with the patient's family about the procedure and answered all questions.      ESTIMATED BLOOD LOSS:300cc   SPECIMENS:None    IMPLANT PUTTY DBX 10CC - [id number]  Inventory Item: PUTTY DBX 10CC Serial no.: [id number] Model/Cat no.: [id number]  Implant name: Janae Sauce 10CC - [id number] Laterality: Bilateral Area: Spine Cervical  Manufacturer: Loralyn Freshwater TRANSPLANT FNDN Date of Manufacture:    Action: Implanted Number Used: 1   Device Identifier:  Device Identifier Type:    PUTTY DBX 10CC - [id number]  Inventory Item: PUTTY DBX 10CC Serial no.: [id number] Model/Cat no.: [id number]  Implant name: Edwina Barth - [id number] Laterality: Bilateral Area: Spine Cervical  Manufacturer: Loralyn [employer]water TRANSPLANT FNDN Date of Manufacture:    Action: Implanted Number Used: 1   Device Identifier:  Device Identifier Type:    SCREW POLY 3.5X12 FOR 4 ROD - [id number]  Inventory Item: SCREW POLY 3.5X12 FOR 4 ROD Serial no.:  Model/Cat no.: [id number]  Implant name: SCREW POLY 3.5X12 FOR 4 ROD - [id number] Laterality: N/A Area: Spine Cervical  Manufacturer: Lurena Nida [place] Date of Manufacture:    Action: Implanted Number Used: 4    Device Identifier:  Device Identifier Type:    4.5 x 68mm symphony screw  Inventory Item:  Serial no.:  Model/Cat no.: [id number]  Implant name: 4.5 x 8mm symphony screw Laterality: N/A Area: Spine Cervical  Manufacturer: [employer] Date of Manufacture:    Action: Implanted Number Used: 2   Device Identifier:  Device Identifier Type:    4.5 x 68mm symphony screw  Inventory Item:  Serial no.:  Model/Cat no.: [id number]  Implant name: 4.5 x 14mm symphony screw Laterality: N/A Area: Spine Cervical  Manufacturer: [employer] Date of Manufacture:    Action: Implanted Number Used: 2   Device Identifier:  Device Identifier Type:    SCREW SET SPINAL - [id number]  Inventory Item: SCREW SET SPINAL Serial no.:  Model/Cat no.: [id number]  Implant name: SCREW SET SPINAL - [id number] Laterality: N/A Area: Spine Cervical  Manufacturer: Luck Date of Manufacture:    Action: Implanted Number Used: 8   Device Identifier:  Device Identifier Type:    4.0 x 65 prebent rod  Inventory Item:  Serial no.:  Model/Cat no.: [id number]  Implant name: 4.0 x 65 prebent rod Laterality: N/A Area: Spine Cervical  Manufacturer: [place] Date of Manufacture:    Action: Implanted Number Used: 2   Device Identifier:  Device Identifier Type:    BONE [facility] CHIPS 20CC - [id number]  Inventory Item: BONE CANC CHIPS 20CC Serial no.: [id number] Model/Cat no.: [id number]  Implant name: BONE  [facility] CHIPS [facility] - [id number] Laterality: N/A Area: Spine Cervical  Manufacturer: [employer] Date of Manufacture:    Action: Implanted Number Used: 1   Device Identifier:  Device Identifier Type:       ATTESTATION: I performed the procedure in its entirety with the assistance of Marin Olp, physician assistant  Deetta Perla [phone]

## 2019-07-05 NOTE — Transfer of Care (Signed)
Immediate Anesthesia Transfer of Care Note  Patient: Lindsay Austin  Procedure(s) Performed: C5-T2 POSTERIOR DECOMPRESSION AND FUSION (Bilateral ) C6-7 LAMINECTOMY (Bilateral )  Patient Location: PACU  Anesthesia Type:General  Level of Consciousness: awake and drowsy  Airway & Oxygen Therapy: Patient Spontanous Breathing and Patient connected to face mask oxygen  Post-op Assessment: Report given to RN  Post vital signs: stable  Last Vitals:  Vitals Value Taken Time  BP 118/83 07/05/19 1455  Temp    Pulse 82 07/05/19 1455  Resp    SpO2 100 % 07/05/19 1455  Vitals shown include unvalidated device data.  Last Pain:  Vitals:   07/05/19 0837  TempSrc: Temporal  PainSc: 3          Complications: No apparent anesthesia complications

## 2019-07-05 NOTE — Interval H&P Note (Signed)
History and Physical Interval Note:  07/05/2019 9:13 AM  Lindsay Austin  has presented today for surgery, with the diagnosis of g95.9 cervical myelopathy.  The various methods of treatment have been discussed with the patient and family. After consideration of risks, benefits and other options for treatment, the patient has consented to  Procedure(s): C5-T2 POSTERIOR DECOMPRESSION AND FUSION (Bilateral) C6-7 LAMINECTOMY (Bilateral) as a surgical intervention.  The patient's history has been reviewed, patient examined, no change in status, stable for surgery.  I have reviewed the patient's chart and labs.  Questions were answered to the patient's satisfaction.     Deetta Perla

## 2019-07-05 NOTE — Anesthesia Postprocedure Evaluation (Signed)
Anesthesia Post Note  Patient: Lindsay Austin  Procedure(s) Performed: C5-T2 POSTERIOR DECOMPRESSION AND FUSION (Bilateral ) C6-7 LAMINECTOMY (Bilateral )  Patient location during evaluation: PACU Anesthesia Type: General Level of consciousness: awake and alert Pain management: pain level controlled Vital Signs Assessment: post-procedure vital signs reviewed and stable Respiratory status: spontaneous breathing and respiratory function stable Cardiovascular status: stable Anesthetic complications: no     Last Vitals:  Vitals:   07/05/19 0837 07/05/19 1455  BP: 114/87 118/83  Pulse: 86 82  Resp: 16 (!) 23  Temp: (!) 36.1 C (!) 35.9 C  SpO2: 94% 100%    Last Pain:  Vitals:   07/05/19 0837  TempSrc: Temporal  PainSc: 3                  Frady Taddeo K

## 2019-07-05 NOTE — Progress Notes (Signed)
Patient arrived from PACU in stable condition. Able to consume small amount of jello and a gingerale. Refused insulin BS 156. Patient given PRN dilaudid at 5:05pm for pain score 9/10. Tolerated PO medications without difficulty .

## 2019-07-05 NOTE — Anesthesia Procedure Notes (Addendum)
Arterial Line Insertion Start/End3/11/2019 10:29 AM Performed by: Natasha Mead, CRNA, CRNA  Preanesthetic checklist: patient identified, IV checked, site marked, risks and benefits discussed, surgical consent, monitors and equipment checked, pre-op evaluation, timeout performed and anesthesia consent Patient sedated Right, radial was placed Catheter size: 20 G Hand hygiene performed   Attempts: 2 Procedure performed without using ultrasound guided technique. Following insertion, dressing applied. Post procedure assessment: normal  Patient tolerated the procedure well with no immediate complications.

## 2019-07-05 NOTE — Anesthesia Preprocedure Evaluation (Signed)
Anesthesia Evaluation  Patient identified by MRN, date of birth, ID band Patient awake    Reviewed: Allergy & Precautions, NPO status , Patient's Chart, lab work & pertinent test results  History of Anesthesia Complications Negative for: history of anesthetic complications  Airway Mallampati: III       Dental  (+) Upper Dentures, Partial Lower   Pulmonary neg sleep apnea, neg COPD, Not current smoker,           Cardiovascular hypertension, Pt. on medications (-) Past MI and (-) CHF (-) dysrhythmias + Valvular Problems/Murmurs (murmur, no tx)      Neuro/Psych Seizures - (none in 25 years),  Anxiety Depression    GI/Hepatic PUD, GERD  Medicated and Controlled,  Endo/Other  neg diabetes  Renal/GU Renal InsufficiencyRenal disease (s/p nephrectomy for renal ca)     Musculoskeletal   Abdominal   Peds  Hematology   Anesthesia Other Findings   Reproductive/Obstetrics                             Anesthesia Physical Anesthesia Plan  ASA: III  Anesthesia Plan: General   Post-op Pain Management:    Induction: Intravenous  PONV Risk Score and Plan: 3 and Ondansetron, Dexamethasone and Midazolam  Airway Management Planned: Oral ETT  Additional Equipment:   Intra-op Plan:   Post-operative Plan:   Informed Consent: I have reviewed the patients History and Physical, chart, labs and discussed the procedure including the risks, benefits and alternatives for the proposed anesthesia with the patient or authorized representative who has indicated his/her understanding and acceptance.       Plan Discussed with:   Anesthesia Plan Comments:         Anesthesia Quick Evaluation

## 2019-07-06 ENCOUNTER — Inpatient Hospital Stay: Payer: Medicaid Other

## 2019-07-06 LAB — GLUCOSE, CAPILLARY
Glucose-Capillary: 111 mg/dL — ABNORMAL HIGH (ref 70–99)
Glucose-Capillary: 91 mg/dL (ref 70–99)
Glucose-Capillary: 91 mg/dL (ref 70–99)
Glucose-Capillary: 123 mg/dL — ABNORMAL HIGH (ref 70–99)

## 2019-07-06 MED ORDER — PREGABALIN 50 MG PO CAPS
100.0000 mg | ORAL_CAPSULE | Freq: Three times a day (TID) | ORAL | Status: DC
  Administered 2019-07-06 (×3): 100 mg via ORAL
  Filled 2019-07-06 (×3): qty 2

## 2019-07-06 MED ORDER — HYDROMORPHONE HCL 1 MG/ML IJ SOLN
1.0000 mg | INTRAMUSCULAR | Status: DC | PRN
  Administered 2019-07-06 (×2): 1 mg via INTRAVENOUS
  Filled 2019-07-06 (×2): qty 1

## 2019-07-06 MED ORDER — LIDOCAINE 5 % EX PTCH
2.0000 | MEDICATED_PATCH | CUTANEOUS | Status: DC
  Administered 2019-07-06 – 2019-07-10 (×5): 2 via TRANSDERMAL
  Filled 2019-07-06 (×5): qty 2

## 2019-07-06 MED ORDER — HYDROMORPHONE HCL 2 MG PO TABS: 2.0000 mg | ORAL_TABLET | ORAL | Status: DC | PRN

## 2019-07-06 NOTE — TOC Initial Note (Signed)
Transition of Care [facility]) - Initial/Assessment Note    Patient Details  Name: Lindsay Austin MRN: [id number] Date of Birth: [birth year]  [facility] CM/SW Contact:    Su Hilt, RN Phone Number: 07/06/2019, 2:33 PM  Clinical Narrative:                 Met with the patient to discuss DC plan and needs, she plans to go to outpatient PT, she iwll call Dr cooks office when she gets her daughters days off and make an appointment when her daughter can take her, she stated that her insurance will not cover [place] She needs a RW, I notified Brad with [employer] of the need and it will be brought into the room before DC She has no additional needs, will continue to monitor Expected Discharge Plan: OP Rehab Barriers to Discharge: Continued Medical Work up   Patient Goals and CMS Choice Patient states their goals for this hospitalization and ongoing recovery are:: go home      Expected Discharge Plan and Services Expected Discharge Plan: OP Rehab   Discharge Planning Services: CM Consult                     DME Arranged: Walker rolling DME Agency: [employer] Date DME Agency Contacted: 07/06/19 Time DME Agency Contacted: [phone] Representative spoke with at DME Agency: [place] Arranged: NA          Prior Living Arrangements/Services     Patient language and need for interpreter reviewed:: Yes Do you feel safe going back to the place where you live?: Yes      Need for Family Participation in Patient Care: No (Comment) Care giver support system in place?: Yes (comment)   Criminal Activity/Legal Involvement Pertinent to Current Situation/Hospitalization: No - Comment as needed  Activities of Daily Living Home Assistive Devices/Equipment: Dentures (specify type), Other (Comment), Eyeglasses, Electric scooter, Radio producer (specify quad or straight) ADL Screening (condition at time of admission) Patient's cognitive ability adequate to safely complete daily activities?: Yes Is the  patient deaf or have difficulty hearing?: No Does the patient have difficulty seeing, even when wearing glasses/contacts?: No Does the patient have difficulty concentrating, remembering, or making decisions?: Yes Patient able to express need for assistance with ADLs?: No Does the patient have difficulty dressing or bathing?: No Independently performs ADLs?: Yes (appropriate for developmental age) Does the patient have difficulty walking or climbing stairs?: Yes Weakness of Legs: Both Weakness of Arms/Hands: Both  Permission Sought/Granted   Permission granted to share information with : Yes, Verbal Permission Granted              Emotional Assessment Appearance:: Appears stated age Attitude/Demeanor/Rapport: Engaged Affect (typically observed): Appropriate Orientation: : Oriented to  Time, Oriented to Place, Oriented to Self Alcohol / Substance Use: Not Applicable Psych Involvement: No (comment)  Admission diagnosis:  S/P cervical spinal fusion [Z98.1] Patient Active Problem List   Diagnosis Date Noted  . S/P cervical spinal fusion 07/05/2019  . Contusion of right hip 06/03/2019  . Spinal stenosis in cervical region 06/03/2019  . Cervical spondylosis without myelopathy 09/30/2018  . Preoperative testing 09/30/2018  . Major depressive disorder with current active episode 07/23/2018  . Lumbar spondylosis 07/23/2018  . Hx of lumbosacral spine surgery 01/15/2018  . History of lumbar fusion (L5-S1 fusion) 01/15/2018  . Failed spinal cord stimulator, sequela 01/15/2018  . Cervical fusion syndrome (C4-C7) 01/15/2018  . Chronic pain syndrome 01/15/2018   PCP:  [name], [place] Pharmacy:   [id number] [place] [place] [address]Phone: [phone]: [phone]    Social Determinants of Health (SDOH) Interventions    Readmission Risk Interventions No flowsheet data found.

## 2019-07-06 NOTE — Evaluation (Addendum)
Occupational Therapy Evaluation Patient Details Name: Lindsay Utterback MRN: [id number] DOB: 1961-02-17 Today's Date: 07/06/2019    History of Present Illness Pt is 59 y/o F with PMH: cervical stenosis at C6/7 after prior C4-7 ACDF. She has had incomplete fusion at C6/7 and symptoms of numbness and weakness in hands. Pt now s/p arthrodesis, posterior Cervical C5 - T2, lateral mass screws at C5/C6, pedicle screw fixation at T1, T2 and C6, C7 laminectomy on 07/05/2019.   Clinical Impression   Pt was seen for OT evaluation this date. Prior to hospital admission, pt was Indep with BADLs with some assist from family with IADLs. Pt lives with her dtr and SIL as well as two grandsons and has second daughter that will be flying in for two weeks to help care for pt when she d/c's. Currently pt demonstrates impairments as described below (See OT problem list) which functionally limit her ability to perform ADL/self-care tasks. Pt required MOD A for side-lying to sit to utilize logroll technique. Pt 's cervical collar donned in EOB sitting with pt tolerating brace well throughout and confirming understanding of rationale and wear recommendations. Pt currently requires MAX A for LB ADLs including threading brief/pants in sitting, MIN A for UB ADLs d/t limited tolerance for employing UEs in ADLs at this time 2/2 pain, MIN A for ADL transfers from elevated surface, and CGA for static standing during fxl tasks with RW.  Pt would benefit from skilled OT to address noted impairments and functional limitations (see below for any additional details) in order to maximize safety and independence while minimizing falls risk and caregiver burden. Upon hospital discharge, recommend HHOT to maximize pt safety and return to functional independence during meaningful occupations of daily life.     Follow Up Recommendations  Home health OT;Supervision - Intermittent    Equipment Recommendations  3 in 1 bedside commode     Recommendations for Other Services       Precautions / Restrictions Precautions Precautions: Cervical Required Braces or Orthoses: Cervical Brace Cervical Brace: Hard collar(for OOB)      Mobility Bed Mobility Overal bed mobility: Needs Assistance Bed Mobility: Sidelying to Sit   Sidelying to sit: Mod assist          Transfers Overall transfer level: Needs assistance Equipment used: Rolling walker (2 wheeled) Transfers: Sit to/from Stand Sit to Stand: Min assist              Balance Overall balance assessment: Needs assistance Sitting-balance support: No upper extremity supported;Feet supported Sitting balance-Leahy Scale: Good     Standing balance support: Bilateral upper extremity supported Standing balance-Leahy Scale: Fair Standing balance comment: CGA with UE support for static stand                           ADL either performed or assessed with clinical judgement   ADL                                         General ADL Comments: requires MIN A for seated UB ADLs, requires MAX A for LB seated/standing ADLs including threading and donning pull up brief. Requires MIN A with transfers with RW, and CGA in static standing for balance.     Vision Baseline Vision/History: Wears glasses Wears Glasses: At all times Patient Visual Report: No change from baseline  Perception     Praxis      Pertinent Vitals/Pain Pain Assessment: Faces Faces Pain Scale: Hurts whole lot Pain Location: cervical/upper thoracic spine region-increased with any UE mvmt Pain Descriptors / Indicators: Aching;Shooting Pain Intervention(s): Limited activity within patient's tolerance;Monitored during session;Premedicated before session;Repositioned     Hand Dominance Right   Extremity/Trunk Assessment Upper Extremity Assessment Upper Extremity Assessment: Generalized weakness;RUE deficits/detail;LUE deficits/detail(grossly shld 3-/5, elbow  3/5, grip 4/5) RUE Deficits / Details: reports more sensation in R UE than L RUE Sensation: decreased light touch LUE Sensation: decreased light touch   Lower Extremity Assessment Lower Extremity Assessment: Defer to PT evaluation;Overall WFL for tasks assessed(reports more numbness in R LE versus L)   Cervical / Trunk Assessment Cervical / Trunk Assessment: (head/neck forward)   Communication Communication Communication: No difficulties   Cognition Arousal/Alertness: Awake/alert Behavior During Therapy: WFL for tasks assessed/performed Overall Cognitive Status: Within Functional Limits for tasks assessed                                     General Comments       Exercises Other Exercises Other Exercises: OT facilitaes education re: cervical brace wear and pt verbalized understanding Other Exercises: OT facilitates education re: task modifications for LB ADLs to reduce amount of bending forward as this is uncomfortable. Pt verbalized understanding, but would benefit from f/u   Shoulder Instructions      Home Living Family/patient expects to be discharged to:: Private residence Living Arrangements: Children(daughter, son in law, and 2 grandsons) Available Help at Discharge: Family;Available PRN/intermittently(other daughter will be flying in and staying for 2 weeks when pt gets home from hospital to assist) Type of Home: House Home Access: Stairs to enter [employer] of Steps: 4 Entrance Stairs-Rails: Can reach both Home Layout: Two level;Able to live on main level with bedroom/bathroom Alternate Level Stairs-Number of Steps: 12, but has chair lift to second floor if needed   Bathroom Shower/Tub: Tub/shower unit(removable shower head)   Bathroom Toilet: Handicapped height     Home Equipment: Environmental consultant - 4 wheels;Cane - quad;Adaptive equipment Adaptive Equipment: Reacher Additional Comments: pt endorses >5 falls in a year      Prior  Functioning/Environment Level of Independence: Independent with assistive device(s)        Comments: Pt reports having several canes around and using intermittently. She reports generally being Indep with BADLs, but requiring assist for IADLs including transportation (states she can drive, but prefers to only run errands when her daughter is off and can take her)        OT Problem List: Decreased strength;Decreased range of motion;Decreased activity tolerance;Decreased knowledge of use of DME or AE;Impaired sensation;Pain      OT Treatment/Interventions:      OT Goals(Current goals can be found in the care plan section) Acute Rehab OT Goals Patient Stated Goal: to get relief from this pain OT Goal Formulation: With patient Time For Goal Achievement: 07/20/19 Potential to Achieve Goals: Good  OT Frequency: Min 2X/week   Barriers to D/C:            Co-evaluation              AM-PAC OT "6 Clicks" Daily Activity     Outcome Measure Help from another person eating meals?: None Help from another person taking care of personal grooming?: A Little Help from another person toileting, which includes using  toliet, bedpan, or urinal?: A Lot Help from another person bathing (including washing, rinsing, drying)?: A Lot Help from another person to put on and taking off regular upper body clothing?: A Little Help from another person to put on and taking off regular lower body clothing?: A Lot 6 Click Score: 16   End of Session Equipment Utilized During Treatment: Gait belt;Rolling walker Nurse Communication: Mobility status  Activity Tolerance: Patient tolerated treatment well;Patient limited by pain Patient left: in chair;with call bell/phone within reach;with chair alarm set  OT Visit Diagnosis: Unsteadiness on feet (R26.81);Muscle weakness (generalized) (M62.81)                Time: 0910-1006 OT Time Calculation (min): 56 min Charges:  OT General Charges $OT Visit: 1  Visit OT Evaluation $OT Eval Moderate Complexity: 1 Mod OT Treatments $Self Care/Home Management : 23-37 mins $Therapeutic Activity: 8-22 mins  Gerrianne Scale, MS, OTR/L ascom [phone] 07/06/19, 11:29 AM

## 2019-07-06 NOTE — Evaluation (Signed)
Physical Therapy Evaluation Patient Details Name: Lindsay Austin MRN: [id number] DOB: 03/24/1961 Today's Date: 07/06/2019   History of Present Illness  59 y/o F with PMH: cervical stenosis at C6/7 after prior C4-7 ACDF. She has had incomplete fusion at C6/7 and symptoms of numbness and weakness in hands. Pt now s/p arthrodesis, posterior Cervical C5 - T2, lateral mass screws at C5/C6, pedicle screw fixation at T1, T2 and C6, C7 laminectomy on 07/05/2019.  Clinical Impression  Pt pleasant t/o session, but at times hesitant to initiate movement, etc.  She was ultimately able to ambulate ~35 ft in the room with plenty of extra time and encouragement.  Pt with very slow and guarded ambulation, reliant on the walker (but quick to fatigue in shoudlers/UEs due to its use).  She did not have any buckling or overt safety issues, but pt lacked confidence t/o the effort.  Pt will have 24/7 assist at home and will be able to keep herself relatively limited re: activity.  Pt in a lot of pain and groaning with essentially every movement t/o the session.  Pt will benefit from continued to PT to insure safe transition home, work on ambulation and strength and generally increase function - apparently pt will need to have a significant lumbar surgery when she has gotten through rehabbing post neck sx.    Follow Up Recommendations Home health PT;Follow surgeon's recommendation for DC plan and follow-up therapies;Supervision for mobility/OOB    Equipment Recommendations  Rolling walker with 5" wheels;3in1 (PT)    Recommendations for Other Services       Precautions / Restrictions Precautions Precautions: Cervical Required Braces or Orthoses: Cervical Brace Cervical Brace: ([place]) Restrictions Weight Bearing Restrictions: No      Mobility  Bed Mobility Overal bed mobility: Needs Assistance Bed Mobility: Sidelying to Sit   Sidelying to sit: Mod assist       General bed mobility comments: in recliner on  arrival  Transfers Overall transfer level: Needs assistance Equipment used: Rolling walker (2 wheeled) Transfers: Sit to/from Stand Sit to Stand: Min assist         General transfer comment: Pt hesitant and needing extra time and cuing, but was able to very guardedly get to standing with only light assist to insure keeping momentum and not "falling" back into recliner  Ambulation/Gait Ambulation/Gait assistance: Mod assist Gait Distance (Feet): 35 Feet Assistive device: Rolling walker (2 wheeled)       General Gait Details: Pt with very slow and guarded ambulation, reliant on walker, but no overt LOBs or buckling.  Pt with groaning and c/o pain t/o the entire session but showed great determination to get to door and back despite extra time and effort with slow and guarded gait.  Stairs            Wheelchair Mobility    Modified Rankin (Stroke Patients Only)       Balance Overall balance assessment: Needs assistance Sitting-balance support: No upper extremity supported;Feet supported Sitting balance-Leahy Scale: Good Sitting balance - Comments: Pt c/o pain with upright sitting w/o support, but did not have unsteadiness   Standing balance support: Bilateral upper extremity supported Standing balance-Leahy Scale: Fair Standing balance comment: needed walker for stability, but again hesitancy and pain were limiters t/o standing efforts                             Pertinent Vitals/Pain Pain Assessment: 0-10 Pain Score: 8  Faces Pain Scale: Hurts whole lot Pain Location: cervical/shoulder area as well as chronic low back and R leg Pain Descriptors / Indicators: Aching;Shooting Pain Intervention(s): Limited activity within patient's tolerance;Monitored during session;Premedicated before session;Repositioned    Home Living Family/patient expects to be discharged to:: Private residence Living Arrangements: Children Available Help at Discharge:  Family;Available PRN/intermittently(other daughter will be flying in and staying for 2 weeks whe) Type of Home: House Home Access: Stairs to enter Entrance Stairs-Rails: Can reach both Entrance Stairs-Number of Steps: 4 Home Layout: Two level;Able to live on main level with bedroom/bathroom Home Equipment: Walker - 4 wheels;Cane - quad;Adaptive equipment Additional Comments: pt endorses >5 falls in a year    Prior Function Level of Independence: Independent with assistive device(s)         Comments: Pt states she uses a walker or cane all the time, has had 10+ falls in the last 6 months but able to care for 2 young grandchildren     Hand Dominance   Dominant Hand: Right    Extremity/Trunk Assessment   Upper Extremity Assessment Upper Extremity Assessment: Generalized weakness RUE Deficits / Details: acute on chronic L UE numbness > R sensory issues RUE Sensation: decreased light touch LUE Sensation: decreased light touch    Lower Extremity Assessment Lower Extremity Assessment: Generalized weakness(R hip/thigh pain, acute on chronic exacerbation)    Cervical / Trunk Assessment Cervical / Trunk Assessment: (head/neck forward)  Communication   Communication: No difficulties  Cognition Arousal/Alertness: Awake/alert Behavior During Therapy: WFL for tasks assessed/performed;Anxious Overall Cognitive Status: Within Functional Limits for tasks assessed                                 General Comments: Pt with some apparent memory issues asking the same questions multiple times t/o session.       General Comments          Assessment/Plan    PT Assessment Patient needs continued PT services  PT Problem List Decreased strength;Decreased activity tolerance;Decreased balance;Decreased mobility;Decreased knowledge of use of DME;Decreased safety awareness;Decreased range of motion;Pain;Decreased knowledge of precautions;Decreased coordination       PT  Treatment Interventions Gait training;Stair training;DME instruction;Functional mobility training;Therapeutic activities;Therapeutic exercise;Balance training;Neuromuscular re-education;Patient/family education    PT Goals (Current goals can be found in the Care Plan section)  Acute Rehab PT Goals Patient Stated Goal: get relief from pain PT Goal Formulation: With patient Time For Goal Achievement: 07/20/19 Potential to Achieve Goals: Good    Frequency BID   Barriers to discharge        Co-evaluation               AM-PAC PT "6 Clicks" Mobility  Outcome Measure Help needed turning from your back to your side while in a flat bed without using bedrails?: A Little Help needed moving from lying on your back to sitting on the side of a flat bed without using bedrails?: A Little Help needed moving to and from a bed to a chair (including a wheelchair)?: A Little Help needed standing up from a chair using your arms (e.g., wheelchair or bedside chair)?: A Little Help needed to walk in hospital room?: A Little Help needed climbing 3-5 steps with a railing? : A Lot 6 Click Score: 17    End of Session Equipment Utilized During Treatment: Gait belt Activity Tolerance: Patient limited by pain Patient left: with chair alarm set;with call bell/phone  within reach   PT Visit Diagnosis: Muscle weakness (generalized) (M62.81);Difficulty in walking, not elsewhere classified (R26.2);Other abnormalities of gait and mobility (R26.89);Pain;Other symptoms and signs involving the nervous system (R29.898) Pain - part of body: (cervicalgia)    Time: [id number] PT Time Calculation (min) (ACUTE ONLY): 32 min   Charges:   PT Evaluation $PT Eval Low Complexity: 1 Low PT Treatments $Gait Training: 8-22 mins        Kreg Shropshire, DPT 07/06/2019, 11:54 AM

## 2019-07-06 NOTE — Progress Notes (Signed)
PT Cancellation Note  Patient Details Name: Lindsay Austin MRN: [id number] DOB: 1960/11/17   Cancelled Treatment:    Reason Eval/Treat Not Completed: Pain limiting ability to participate Pt out of room for imaging mid day.  Came by a few times this afternoon - firstly she reports she just needs some time to settle back in and let pain calm down from the activity, next attempt she was on the bed pan and finally she very pleasantly deferred secondary to feeling very tired, having continued high pain levels and reporting spasms in neck/L shoulder.  Pt reports she is eager to do more walking tomorrow, but just "not feeling it" today.  Kreg Shropshire, DPT 07/06/2019, 3:57 PM

## 2019-07-07 ENCOUNTER — Inpatient Hospital Stay: Payer: Medicaid Other

## 2019-07-07 DIAGNOSIS — J9601 Acute respiratory failure with hypoxia: Secondary | ICD-10-CM | POA: Diagnosis present

## 2019-07-07 DIAGNOSIS — E162 Hypoglycemia, unspecified: Secondary | ICD-10-CM | POA: Diagnosis present

## 2019-07-07 DIAGNOSIS — G9341 Metabolic encephalopathy: Secondary | ICD-10-CM | POA: Diagnosis present

## 2019-07-07 DIAGNOSIS — Z981 Arthrodesis status: Secondary | ICD-10-CM

## 2019-07-07 DIAGNOSIS — G894 Chronic pain syndrome: Secondary | ICD-10-CM

## 2019-07-07 DIAGNOSIS — K259 Gastric ulcer, unspecified as acute or chronic, without hemorrhage or perforation: Secondary | ICD-10-CM | POA: Diagnosis present

## 2019-07-07 DIAGNOSIS — F329 Major depressive disorder, single episode, unspecified: Secondary | ICD-10-CM

## 2019-07-07 DIAGNOSIS — I10 Essential (primary) hypertension: Secondary | ICD-10-CM | POA: Diagnosis present

## 2019-07-07 DIAGNOSIS — K219 Gastro-esophageal reflux disease without esophagitis: Secondary | ICD-10-CM

## 2019-07-07 LAB — BASIC METABOLIC PANEL
Anion gap: 9 (ref 5–15)
BUN: 11 mg/dL (ref 6–20)
CO2: 24 mmol/L (ref 22–32)
Calcium: 8.6 mg/dL — ABNORMAL LOW (ref 8.9–10.3)
Chloride: 109 mmol/L (ref 98–111)
Creatinine, Ser: 0.96 mg/dL (ref 0.44–1.00)
GFR calc Af Amer: >60 mL/min (ref >60)
GFR calc non Af Amer: >60 mL/min (ref >60)
Glucose, Bld: 113 mg/dL — ABNORMAL HIGH (ref 70–99)
Potassium: 5.7 mmol/L — ABNORMAL HIGH (ref 3.5–5.1)
Sodium: 142 mmol/L (ref 135–145)

## 2019-07-07 LAB — BLOOD GAS, ARTERIAL
Acid-Base Excess: 3.2 mmol/L — ABNORMAL HIGH (ref 0.0–2.0)
Bicarbonate: 31.7 mmol/L — ABNORMAL HIGH (ref 20.0–28.0)
FIO2: 0.44
O2 Saturation: 96.8 %
Patient temperature: 37
pCO2 arterial: 66 mmHg (ref 32.0–48.0)
pH, Arterial: 7.29 — ABNORMAL LOW (ref 7.350–7.450)
pO2, Arterial: 98 mmHg (ref 83.0–108.0)

## 2019-07-07 LAB — GLUCOSE, CAPILLARY
Glucose-Capillary: 111 mg/dL — ABNORMAL HIGH (ref 70–99)
Glucose-Capillary: 102 mg/dL — ABNORMAL HIGH (ref 70–99)
Glucose-Capillary: 90 mg/dL (ref 70–99)
Glucose-Capillary: 68 mg/dL — ABNORMAL LOW (ref 70–99)
Glucose-Capillary: 126 mg/dL — ABNORMAL HIGH (ref 70–99)
Glucose-Capillary: 91 mg/dL (ref 70–99)

## 2019-07-07 LAB — CBC
HCT: 33.7 % — ABNORMAL LOW (ref 36.0–46.0)
Hemoglobin: 11.3 g/dL — ABNORMAL LOW (ref 12.0–15.0)
MCH: 30.9 pg (ref 26.0–34.0)
MCHC: 33.5 g/dL (ref 30.0–36.0)
MCV: 92.1 fL (ref 80.0–100.0)
Platelets: 119 10*3/uL — ABNORMAL LOW (ref 150–400)
RBC: 3.66 MIL/uL — ABNORMAL LOW (ref 3.87–5.11)
RDW: 13.5 % (ref 11.5–15.5)
WBC: 13.7 10*3/uL — ABNORMAL HIGH (ref 4.0–10.5)
nRBC: 0 % (ref 0.0–0.2)

## 2019-07-07 LAB — MRSA PCR SCREENING: MRSA by PCR: NEGATIVE

## 2019-07-07 LAB — TROPONIN I (HIGH SENSITIVITY): Troponin I (High Sensitivity): 159 ng/L (ref <18)

## 2019-07-07 LAB — CK: Total CK: 196 U/L (ref 38–234)

## 2019-07-07 LAB — POTASSIUM: Potassium: 3.8 mmol/L (ref 3.5–5.1)

## 2019-07-07 LAB — CKMB (ARMC ONLY): CK, MB: 5.1 ng/mL — ABNORMAL HIGH (ref 0.5–5.0)

## 2019-07-07 LAB — BRAIN NATRIURETIC PEPTIDE: B Natriuretic Peptide: 244 pg/mL — ABNORMAL HIGH (ref 0.0–100.0)

## 2019-07-07 MED ORDER — CHLORHEXIDINE GLUCONATE 0.12 % MT SOLN
15.0000 mL | Freq: Two times a day (BID) | OROMUCOSAL | Status: DC
  Administered 2019-07-07 – 2019-07-10 (×4): 15 mL via OROMUCOSAL
  Filled 2019-07-07 (×5): qty 15

## 2019-07-07 MED ORDER — ORAL CARE MOUTH RINSE
15.0000 mL | Freq: Two times a day (BID) | OROMUCOSAL | Status: DC
  Administered 2019-07-07 – 2019-07-10 (×5): 15 mL via OROMUCOSAL

## 2019-07-07 MED ORDER — SODIUM CHLORIDE 0.9 % IV SOLN: INTRAVENOUS | Status: DC

## 2019-07-07 MED ORDER — MORPHINE SULFATE ER 30 MG PO TBCR
30.0000 mg | EXTENDED_RELEASE_TABLET | Freq: Two times a day (BID) | ORAL | Status: DC
  Administered 2019-07-07 – 2019-07-09 (×4): 30 mg via ORAL
  Filled 2019-07-07 (×5): qty 2

## 2019-07-07 MED ORDER — ACETAMINOPHEN 10 MG/ML IV SOLN
1000.0000 mg | Freq: Four times a day (QID) | INTRAVENOUS | Status: AC
  Administered 2019-07-07 – 2019-07-08 (×4): 1000 mg via INTRAVENOUS
  Filled 2019-07-07 (×4): qty 100

## 2019-07-07 MED ORDER — HYDROMORPHONE HCL 1 MG/ML IJ SOLN
1.0000 mg | Freq: Once | INTRAMUSCULAR | Status: AC
  Administered 2019-07-07: 1 mg via INTRAVENOUS
  Filled 2019-07-07: qty 1

## 2019-07-07 MED ORDER — HYDRALAZINE HCL 20 MG/ML IJ SOLN
5.0000 mg | INTRAMUSCULAR | Status: DC | PRN
  Filled 2019-07-07: qty 0.25

## 2019-07-07 MED ORDER — CHLORHEXIDINE GLUCONATE CLOTH 2 % EX PADS
6.0000 | MEDICATED_PAD | Freq: Every day | CUTANEOUS | Status: DC
  Administered 2019-07-10: 6 via TOPICAL

## 2019-07-07 MED ORDER — SODIUM POLYSTYRENE SULFONATE 15 GM/60ML PO SUSP
30.0000 g | Freq: Once | ORAL | Status: AC
  Administered 2019-07-07: 30 g via RECTAL
  Filled 2019-07-07: qty 120

## 2019-07-07 MED ORDER — DEXTROSE 50 % IV SOLN: 50.0000 mL | INTRAVENOUS | Status: DC | PRN

## 2019-07-07 MED ORDER — HYDRALAZINE HCL 50 MG PO TABS: 25.0000 mg | ORAL_TABLET | Freq: Three times a day (TID) | ORAL | Status: DC | PRN

## 2019-07-07 MED ORDER — PIPERACILLIN-TAZOBACTAM 3.375 G IVPB
3.3750 g | Freq: Three times a day (TID) | INTRAVENOUS | Status: DC
  Administered 2019-07-07 – 2019-07-08 (×3): 3.375 g via INTRAVENOUS
  Filled 2019-07-07 (×5): qty 50

## 2019-07-07 MED ORDER — NALOXONE HCL 0.4 MG/ML IJ SOLN
0.1000 mg | Freq: Once | INTRAMUSCULAR | Status: AC
  Administered 2019-07-07: 0.1 mg via INTRAVENOUS
  Filled 2019-07-07: qty 1

## 2019-07-07 MED ORDER — SODIUM ZIRCONIUM CYCLOSILICATE 5 G PO PACK
10.0000 g | PACK | Freq: Once | ORAL | Status: DC
  Administered 2019-07-07: 10 g via ORAL
  Filled 2019-07-07: qty 2

## 2019-07-07 NOTE — Consult Note (Signed)
Pharmacy Antibiotic Note  Lindsay Austin is a 59 y.o. female admitted on 07/05/2019 with pneumonia.  Pharmacy has been consulted for Zosyn  Dosing.  D/w MD negative MRSA PCR  Plan: Zosyn 3.375 g Q8H extended infusion      Temp (24hrs), Avg:98.7 F (37.1 C), Min:98.3 F (36.8 C), Max:99 F (37.2 C)  Recent Labs  Lab 07/01/19 1034 07/07/19 1104  WBC 6.6 13.7*  CREATININE 1.31* 0.96    Estimated Creatinine Clearance: 65.8 mL/min (by C-G formula based on SCr of 0.96 mg/dL).    Allergies  Allergen Reactions  . Methadone Palpitations  . Other Swelling and Other (See Comments)    Tricyclic antidepressants Unable to void Tongue Swelling    . Tape Other (See Comments)    Blisters  Paper tape ok Tegaderm ok     Antimicrobials this admission:   Dose adjustments this admission:   Microbiology results:   Thank you for allowing pharmacy to be a part of this patient's care.  Rowland Lathe 07/07/2019 4:30 PM

## 2019-07-07 NOTE — Progress Notes (Signed)
OVERNIGHT Nurse reports patient with lethargy, low oxygen saturation and urinary retenton in patient with s/p cervical spine fusion.  ABG shows respiratory acidosis/failure with hypoxia and hypercapnia Bediside patient lethargic with slurred but appropriate speech.  She reports similar problemes with decreased mental status with use of lyrical. She reports lyrica dose at home decreased to 50 mg twice a day.  Acute respiratory failure likely multifactorial with post surgery, pain, atelectsis and medications. Transfer to stepdown unit for bipap therapy to treat acute failure. Discussed with patient.  Narcotics and lyrica temporarily on hold. Will treat acute pain with short acting opiates as needed until ventilation is better Urinary retention also as result of meds, surgery etc. Will need to continue to monitor strict I & O. Place foley catheter if indicated by need for repeated in an out cath

## 2019-07-07 NOTE — Progress Notes (Signed)
Bladder scan assessed 839ml in retention.  Will perform foley cath.

## 2019-07-07 NOTE — Progress Notes (Signed)
Upon entering pt room for purposes of 11:30pm bladder scan pt noted pale and difficult to arouse/awaken, VS obtained and O2 sat noted at 78%, HR-114, Francena Hanly, RN notified, O2 applied at 6L via nasal cannula,  O2 sat increased to 96% on 6L, pt pallor and orientation improved, Sharion Settler, NP notified of pt status and concerns, order for ABG received, NP made aware of results, NP arrived to floor to assess pt, pt to be transported to step down unit per NP

## 2019-07-07 NOTE — Consult Note (Addendum)
Medical Consultation   Lindsay Austin  [id number]  DOB: 10-Mar-1961  DOA: 07/05/2019  PCP: [name], [place]    Outpatient Specialists:    Requesting physician: PA, Estill Bamberg, working with Dr. Lacinda Axon of Neurosurgeon  Reason for consultation: Oxygen desaturation   History of Present Illness: Lindsay Austin is an 59 y.o. female with a past medical history of hypertension, hyperlipidemia, GERD, depression, anxiety, IBS, seizure, gastric ulcer, fibromyalgia, kidney cancer, trigeminal neuralgia, RSD (reflex sympathetic dystrophy), who was admitted by neurosurgeon and underwent C5-T2 posterior cervical decompression and fusion two days ago. We are asked to consult due to oxygen desaturation.  Per Estill Bamberg PA, pt underwent C5-T2 posterior cervical decompression and fusion Monday. Pt was found to be very drowsy and had oxygen desaturation to 83% on room air last night and this morning.  Patient was initially put on BiPAP. When I saw pt on the floor, she is off BiPAP. Her oxygen saturation is 91 to 95% on room air.  Patient is drowsy, but easily arousable, and is orientated x3. She easily falls asleep. When she falls asleep, her oxygen desaturated 89-91% on room air, and today improved to 95% when she is awake up. She denies any chest pain, cough, fever or chills.  No nausea vomiting, diarrhea or abdominal pain.  No symptoms of UTI.  She states that she still has some left hand fingers numbness and neck pain. Of note, pt has hx of fibromyalgia.  She is on chronic MS contin 60 mg every 12 hours, which was increasesd to 90 mg every 12 hours.   Patient is also on Lexapro, Wellbutrin and Lyrica.   Review of Systems:   General: no fevers, chills, no changes in body weight, no changes in appetite Skin: no rash HEENT: no blurry vision, hearing changes or sore throat Pulm: no dyspnea, coughing, wheezing CV: no chest pain, palpitations, shortness of breath Abd: no nausea/vomiting, abdominal pain,  diarrhea/constipation GU: no dysuria, hematuria, polyuria Ext: no arthralgias, myalgias.  Has heck pain Neuro: has left hand fingers numbness. Has drowsiness.   Past Medical History: Past Medical History:  Diagnosis Date  . Anxiety   . Arthritis   . Bladder cancer ([place])   . Cancer of kidney ([place])   . DDD (degenerative disc disease), cervical   . Depression   . Failed back syndrome   . Fibromyalgia   . GERD (gastroesophageal reflux disease)   . Headache    migraines   . Heart murmur   . Hyperlipemia   . Hypertension    not on any medications  . IBS (irritable bowel syndrome)   . Kidney failure   . Renal insufficiency   . RSD (reflex sympathetic dystrophy)   . Seizure ([place])   . Stomach ulcer   . Trigeminal neuralgia     Past Surgical History: Past Surgical History:  Procedure Laterality Date  . BACK SURGERY  1995   x 7  . BRAIN SURGERY  2004  . CHOLECYSTECTOMY    . colonscopy  2006  . NEPHROURETERECTOMY  2011  . POSTERIOR CERVICAL FUSION/FORAMINOTOMY Bilateral 07/05/2019   Procedure: C5-T2 POSTERIOR DECOMPRESSION AND FUSION;  Surgeon: Deetta Perla, MD;  Location: [facility] ORS;  Service: Neurosurgery;  Laterality: Bilateral;  . POSTERIOR CERVICAL LAMINECTOMY Bilateral 07/05/2019   Procedure: C6-7 LAMINECTOMY;  Surgeon: Deetta Perla, MD;  Location: [facility] ORS;  Service: Neurosurgery;  Laterality: Bilateral;  . SPINAL CORD STIMULATOR IMPLANT  2004  . TRANSURETHRAL RESECTION OF BLADDER TUMOR WITH GYRUS (TURBT-GYRUS)  2012, 2013  .  trigeminal neuralgia  2004     Allergies:   Allergies  Allergen Reactions  . Methadone Palpitations  . Other Swelling and Other (See Comments)    Tricyclic antidepressants Unable to void Tongue Swelling    . Tape Other (See Comments)    Blisters  Paper tape ok Tegaderm ok      Social History:  reports that she has never smoked. She has never used smokeless tobacco. She reports that she does not drink alcohol or use drugs.   Family  History: Family History  Problem Relation Age of Onset  . Diabetes Mother   . Heart disease Mother   . Dementia Father   . Kidney disease Father    Physical Exam: Vitals:   07/07/19 1000 07/07/19 1100 07/07/19 1300 07/07/19 1400  BP: (!) 142/93 134/68 139/82 (!) 143/66  Pulse: 90 100 89 86  Resp: 17 19 13 11   Temp:      TempSrc:      SpO2: 96% 93% 93% 94%    General: Not in acute distress HEENT: PERRL, EOMI, no scleral icterus, No JVD or bruit Cardiac: S1/S2, RRR, gallops or rubs Pulm: Clear to auscultation bilaterally. No rales, wheezing, rhonchi or rubs. Abd: Soft, nondistended, nontender, no rebound pain, no organomegaly, BS present Ext: No edema. 2+DP/PT pulse bilaterally Musculoskeletal: No joint deformities, erythema, or stiffness, ROM full Skin: No rashes.  Neuro:  Patient is drowsy, but easily arousable, and is orientated x3.  Moves all extremities normally Psych: Patient is not psychotic, no suicidal or hemocidal ideation.    Labs:  CBC: Recent Labs  Lab 07/01/19 1034 07/07/19 1104  WBC 6.6 13.7*  HGB 13.1 11.3*  HCT 40.8 33.7*  MCV 94.2 92.1  PLT 170 119*    Basic Metabolic Panel: Recent Labs  Lab 07/01/19 1034 07/07/19 1104  NA 139 142  K 4.5 5.7*  CL 101 109  CO2 27 24  GLUCOSE 84 113*  BUN 18 11  CREATININE 1.31* 0.96  CALCIUM 9.1 8.6*   GFR Estimated Creatinine Clearance: 65.8 mL/min (by C-G formula based on SCr of 0.96 mg/dL). Liver Function Tests: No results for input(s): AST, ALT, ALKPHOS, BILITOT, PROT, ALBUMIN in the last 168 hours. No results for input(s): LIPASE, AMYLASE in the last 168 hours. No results for input(s): AMMONIA in the last 168 hours. Coagulation profile Recent Labs  Lab 07/01/19 1034  INR 1.0    Cardiac Enzymes: No results for input(s): CKTOTAL, CKMB, CKMBINDEX, TROPONINI in the last 168 hours. BNP: Invalid input(s): POCBNP CBG: Recent Labs  Lab 07/06/19 2059 07/07/19 0405 07/07/19 0854 07/07/19 1242  07/07/19 1538  GLUCAP 123* 111* 102* 90 68*   D-Dimer No results for input(s): DDIMER in the last 72 hours. Hgb A1c Recent Labs    07/05/19 1720  HGBA1C 5.0   Lipid Profile No results for input(s): CHOL, HDL, LDLCALC, TRIG, CHOLHDL, LDLDIRECT in the last 72 hours. Thyroid function studies No results for input(s): TSH, T4TOTAL, T3FREE, THYROIDAB in the last 72 hours.  Invalid input(s): FREET3 Anemia work up No results for input(s): VITAMINB12, FOLATE, FERRITIN, TIBC, IRON, RETICCTPCT in the last 72 hours. Urinalysis    Component Value Date/Time   COLORURINE YELLOW (A) 07/01/2019 1034   APPEARANCEUR CLEAR (A) 07/01/2019 1034   APPEARANCEUR Clear 04/19/2019 1025   LABSPEC 1.015 07/01/2019 1034   PHURINE 7.0 07/01/2019 1034   GLUCOSEU NEGATIVE 07/01/2019 1034   HGBUR NEGATIVE 07/01/2019 1034   BILIRUBINUR NEGATIVE 07/01/2019 1034   BILIRUBINUR  Negative 04/19/2019 [place] 07/01/2019 1034   PROTEINUR NEGATIVE 07/01/2019 1034   NITRITE NEGATIVE 07/01/2019 [place] 07/01/2019 1034     Microbiology Recent Results (from the past 240 hour(s))  Surgical pcr screen     Status: Abnormal   Collection Time: 07/01/19 11:00 AM   Specimen: Nasal Mucosa; Nasal Swab  Result Value Ref Range Status   MRSA, PCR NEGATIVE NEGATIVE Final   Staphylococcus aureus POSITIVE (A) NEGATIVE Final    Comment: (NOTE) The Xpert SA Assay (FDA approved for NASAL specimens in patients 23 years of age and older), is one component of a comprehensive surveillance program. It is not intended to diagnose infection nor to guide or monitor treatment. Performed at [facility], [place], [place] [address]   SARS CORONAVIRUS 2 (TAT 6-24 HRS)     Status: None   Collection Time: 07/01/19 12:20 PM  Result Value Ref Range Status   SARS Coronavirus 2 NEGATIVE NEGATIVE Final    Comment: (NOTE) SARS-CoV-2 target nucleic acids are NOT DETECTED. The  SARS-CoV-2 RNA is generally detectable in upper and lower respiratory specimens during the acute phase of infection. Negative results do not preclude SARS-CoV-2 infection, do not rule out co-infections with other pathogens, and should not be used as the sole basis for treatment or other patient management decisions. Negative results must be combined with clinical observations, patient history, and epidemiological information. The expected result is Negative. Fact Sheet for Patients: SugarRoll.be Fact Sheet for Healthcare Providers: https://www.woods-mathews.com/ This test is not yet approved or cleared by the [place] FDA and  has been authorized for detection and/or diagnosis of SARS-CoV-2 by FDA under an Emergency Use Authorization (EUA). This EUA will remain  in effect (meaning this test can be used) for the duration of the COVID-19 declaration under Section 56 4(b)(1) of the Act, 21 U.S.C. section 360bbb-3(b)(1), unless the authorization is terminated or revoked sooner. Performed at [facility], [place] [address] [place], [place] [address]   MRSA PCR Screening     Status: None   Collection Time: 07/07/19  4:20 AM   Specimen: Nasal Mucosa; Nasopharyngeal  Result Value Ref Range Status   MRSA by PCR NEGATIVE NEGATIVE Final    Comment:        The GeneXpert MRSA Assay (FDA approved for NASAL specimens only), is one component of a comprehensive MRSA colonization surveillance program. It is not intended to diagnose MRSA infection nor to guide or monitor treatment for MRSA infections. Performed at [facility], [address] [place], [place] [address]        Inpatient Medications:   Scheduled Meds: . chlorhexidine  15 mL Mouth Rinse BID  . Chlorhexidine Gluconate Cloth  6 each Topical Daily  . dicyclomine  20 mg Oral BID  .  HYDROmorphone (DILAUDID) injection  1 mg Intravenous Once  . insulin aspart  0-15  Units Subcutaneous TID WC  . lidocaine  2 patch Transdermal Q24H  . mouth rinse  15 mL Mouth Rinse q12n4p  . morphine  30 mg Oral Q12H  . senna  1 tablet Oral BID  . sodium chloride flush  3 mL Intravenous Q12H  . sodium polystyrene  30 g Rectal Once  . sucralfate  1 g Oral BID  . traZODone  100 mg Oral QHS   Continuous Infusions: . sodium chloride Stopped (07/06/19 0218)  . sodium chloride 75 mL/hr at 07/07/19 1351  Radiological Exams on Admission: DG Cervical Spine 2 or 3 views  Result Date: 07/06/2019 CLINICAL DATA:  Postoperative cervical fusion EXAM: CERVICAL SPINE - 2-3 VIEW COMPARISON:  07/05/2019, 04/01/2019 FINDINGS: Status post prior C4-C7 ACDF. Posterior spinal fusion hardware from C5-T2. Hardware appears well seated and in good alignment. Surgical drain is present. Prevertebral soft tissues are within normal limits. IMPRESSION: Satisfactory postoperative appearance of C4-C7 ACDF and C5-T2 posterior spinal fusion. Electronically Signed   By: Davina Poke D.O.   On: 07/06/2019 11:51   CT CERVICAL SPINE WO CONTRAST  Result Date: 07/06/2019 CLINICAL DATA:  Severe neck pain. Neck surgery 07/05/2019 including C6 and C7 laminectomy and posterior hardware fusion C5 through T2. Prior ACDF C4 through C7. EXAM: CT CERVICAL SPINE WITHOUT CONTRAST TECHNIQUE: Multidetector CT imaging of the cervical spine was performed without intravenous contrast. Multiplanar CT image reconstructions were also generated. COMPARISON:  Cervical MRI 04/01/2019. Cervical spine radiographs 07/06/2019 FINDINGS: Alignment: Normal Skull base and vertebrae: Negative for fracture. ACDF C4 through C7. Interval posterior fusion C4 through T1 with laminectomy at C6 and C7. Soft tissues and spinal canal: Posterior drain is present in the left epidural space at C6-7. This could also be an infusion catheter. No soft tissue hematoma or mass. Few gas bubbles in the posterior soft tissues due to recent surgery. Disc levels:   C2-3: Mild disc degeneration.  Negative for stenosis C3-4: Disc degeneration and spondylosis with diffuse uncinate spurring causing moderate foraminal stenosis bilaterally. Mild spinal canal narrowing. C4-5: ACDF. No solid bony union is present. Diffuse uncinate spurring causing moderate to severe foraminal encroachment bilaterally. Mild spinal stenosis. C5-6: ACDF. Persistent lucency through the disc space without bony union. Diffuse uncinate spurring causing mild to moderate foraminal stenosis bilaterally. Posterior decompression. Posterior fusion hardware in good position C6-7: ACDF. Persistent lucency through the disc space compatible with pseudarthrosis. Mild to moderate foraminal stenosis bilaterally due to uncinate spurring. Posterior laminectomy. Posterior hardware fusion in good position. C7-T1: Negative for spinal or foraminal stenosis. Bilateral pedicle screw fusion in good position. T1-2: Bilateral pedicle screws in good position. Negative for stenosis. Upper chest: Lung apices clear bilaterally. Other: None IMPRESSION: Interval posterior screw fusion from C5 through T1. Hardware in good position. Decompressive laminectomy at C5-6 and C6-7 ACDF at C4-5, C5-6, C6-7. No bony fusion is documented at these 3 fusion levels. There is significant foraminal stenosis at multiple levels in the cervical spine due to uncinate spurring. Electronically Signed   By: Franchot Gallo M.D.   On: 07/06/2019 13:47   DG Chest Port 1 View  Result Date: 07/07/2019 CLINICAL DATA:  Shortness of breath, hypertension EXAM: PORTABLE CHEST 1 VIEW COMPARISON:  None. FINDINGS: Heart size is within normal limits. Low lung volumes. Bilateral perihilar and left basilar linear atelectasis. Additional somewhat streaky bibasilar opacities. No pleural effusion or pneumothorax. Fixation hardware noted at the lower thoracic spine and cervicothoracic junction. IMPRESSION: Low lung volumes with bilateral perihilar and left basilar linear  atelectasis. Additional streaky bibasilar opacities which may reflect atelectasis versus developing infiltrate. Electronically Signed   By: Davina Poke D.O.   On: 07/07/2019 11:24    Impression/Recommendations Principal Problem:   Acute respiratory failure with hypoxia (HCC) Active Problems:   Chronic pain syndrome   Major depressive disorder with current active episode   S/P cervical spinal fusion   HTN (hypertension)   Acute metabolic encephalopathy   Hypoglycemia   Gastric ulcer   Acute respiratory failure with hypoxia [facility]): Etiology is not clear.  Potential  differential diagnosis is overall use of pain medication and sedation.  CXR showed low lung volumes with bilateral perihilar and left basilar linear atelectasis. Additional streaky bibasilar opacities which may reflect atelectasis versus developing infiltrate.  Patient has leukocytosis with WBC 13.7. cannot rule out pneumonia (possible aspiration PNA?). Will start antibiotics empirically. BNP 244.  -will hold off MS Contin - give 0.1 mg of Narcan -Hold Lexapro, Wellbutrin, Lyrica, Robaxin -Nasal cannula oxygen to maintain oxygen saturation 93% -As needed albuterol -Incentive spirometry -Vanco and zosyn -->MRSA negative, therefore, only give zosyn. -get blood culture  Addendum: After pt was given a low dose of narcan 0.1 mg, she developed withdrawal with tachycardia and agitation -Give 1 mg of Dilaudid -Start MS Contin 30 mg twice daily  Chronic pain syndrome: -hold pain narcotics now.  Major depressive disorder with current active episode: -hold home meds  HTN (hypertension): -IV hydralazine as needed  S/P cervical spinal fusion: -hold narcotic pain meds and Lyrica, Robaxin  Hx of gastric ulcer: -Carafate  Acute metabolic encephalopathy: Possibly due to sedation. -Frequent neuro check -Hold sedative medications as above - f/u CT head  Hypoglycemia: Blood sugar 68 -Check blood sugar every 2 hour -As  needed D50     Thank you for this consultation.  Our [facility] hospitalist team will follow the patient with you.   Time Spent:  30 min  Ivor Costa M.D. [facility] 07/07/2019, 4:19 PM

## 2019-07-07 NOTE — Progress Notes (Signed)
PT Cancellation Note  Patient Details Name: Lindsay Austin MRN: [id number] DOB: 01/18/1961   Cancelled Treatment:    Reason Eval/Treat Not Completed: Medical issues which prohibited therapy(Per chart review, patient noted with transfer to CCU overnight for respiratory support.  Currently on venturi mask with persistent lethargy. Not appropriate for PT at this time.  Will re-attempt in PM as medically appropriate and available.)   Jaylon Boylen H. Owens Shark, PT, DPT, NCS 07/07/19, 10:00 AM [phone]

## 2019-07-07 NOTE — H&P (Signed)
Medical Consultation   Raeden Battie  [id number]  DOB: [birth year]  DOA: 07/05/2019  PCP: [name], [place]    Outpatient Specialists:    Requesting physician: PA, Estill Bamberg, working with Dr. Lacinda Axon of Neurosurgeon  Reason for consultation: Oxygen desaturation   History of Present Illness: Marvena Zenger is an 59 y.o. female with a past medical history of hypertension, hyperlipidemia, GERD, depression, anxiety, IBS, seizure, gastric ulcer, fibromyalgia, kidney cancer, trigeminal neuralgia, RSD (reflex sympathetic dystrophy), who was admitted by neurosurgeon and underwent C5-T2 posterior cervical decompression and fusion two days ago. We are asked to consult due to oxygen desaturation.  Per Estill Bamberg PA, pt underwent C5-T2 posterior cervical decompression and fusion Monday. Pt was found to be very drowsy and had oxygen desaturation to 83% on room air last night and this morning.  Patient was initially put on BiPAP. When I saw pt on the floor, she is off BiPAP. Her oxygen saturation is 91 to 95% on room air.  Patient is drowsy, but easily arousable, and is orientated x3. She easily falls asleep. When she falls asleep, her oxygen desaturated 89-91% on room air, and today improved to 95% when she is awake up. She denies any chest pain, cough, fever or chills.  No nausea vomiting, diarrhea or abdominal pain.  No symptoms of UTI.  She states that she still has some left hand fingers numbness and neck pain. Of note, pt has hx of fibromyalgia.  She is on chronic MS contin 60 mg every 12 hours, which was increasesd to 90 mg every 12 hours.   Patient is also on Lexapro, Wellbutrin and Lyrica.   Review of Systems:   General: no fevers, chills, no changes in body weight, no changes in appetite Skin: no rash HEENT: no blurry vision, hearing changes or sore throat Pulm: no dyspnea, coughing, wheezing CV: no chest pain, palpitations, shortness of breath Abd: no nausea/vomiting, abdominal pain,  diarrhea/constipation GU: no dysuria, hematuria, polyuria Ext: no arthralgias, myalgias.  Has heck pain Neuro: has left hand fingers numbness. Has drowsiness.   Past Medical History: Past Medical History:  Diagnosis Date  . Anxiety   . Arthritis   . Bladder cancer ([place])   . Cancer of kidney ([place])   . DDD (degenerative disc disease), cervical   . Depression   . Failed back syndrome   . Fibromyalgia   . GERD (gastroesophageal reflux disease)   . Headache    migraines   . Heart murmur   . Hyperlipemia   . Hypertension    not on any medications  . IBS (irritable bowel syndrome)   . Kidney failure   . Renal insufficiency   . RSD (reflex sympathetic dystrophy)   . Seizure ([place])   . Stomach ulcer   . Trigeminal neuralgia     Past Surgical History: Past Surgical History:  Procedure Laterality Date  . BACK SURGERY  1995   x 7  . BRAIN SURGERY  2004  . CHOLECYSTECTOMY    . colonscopy  2006  . NEPHROURETERECTOMY  2011  . POSTERIOR CERVICAL FUSION/FORAMINOTOMY Bilateral 07/05/2019   Procedure: C5-T2 POSTERIOR DECOMPRESSION AND FUSION;  Surgeon: Deetta Perla, MD;  Location: [facility] ORS;  Service: Neurosurgery;  Laterality: Bilateral;  . POSTERIOR CERVICAL LAMINECTOMY Bilateral 07/05/2019   Procedure: C6-7 LAMINECTOMY;  Surgeon: Deetta Perla, MD;  Location: [facility] ORS;  Service: Neurosurgery;  Laterality: Bilateral;  . SPINAL CORD STIMULATOR IMPLANT  2004  . TRANSURETHRAL RESECTION OF BLADDER TUMOR WITH GYRUS (TURBT-GYRUS)  2012, 2013  . trigeminal neuralgia  2004     Allergies:   Allergies  Allergen Reactions  . Methadone Palpitations  . Other Swelling and Other (See Comments)    Tricyclic antidepressants Unable to void Tongue Swelling    . Tape Other (See Comments)    Blisters  Paper tape ok Tegaderm ok      Social History:  reports that she has never smoked. She has never used smokeless tobacco. She reports that she does not drink alcohol or use drugs.   Family  History: Family History  Problem Relation Age of Onset  . Diabetes Mother   . Heart disease Mother   . Dementia Father   . Kidney disease Father    Physical Exam: Vitals:   07/07/19 1000 07/07/19 1100 07/07/19 1300 07/07/19 1400  BP: (!) 142/93 134/68 139/82 (!) 143/66  Pulse: 90 100 89 86  Resp: 17 19 13 11   Temp:      TempSrc:      SpO2: 96% 93% 93% 94%    General: Not in acute distress HEENT: PERRL, EOMI, no scleral icterus, No JVD or bruit Cardiac: S1/S2, RRR, gallops or rubs Pulm: Clear to auscultation bilaterally. No rales, wheezing, rhonchi or rubs. Abd: Soft, nondistended, nontender, no rebound pain, no organomegaly, BS present Ext: No edema. 2+DP/PT pulse bilaterally Musculoskeletal: No joint deformities, erythema, or stiffness, ROM full Skin: No rashes.  Neuro:  Patient is drowsy, but easily arousable, and is orientated x3.  Moves all extremities normally Psych: Patient is not psychotic, no suicidal or hemocidal ideation.    Labs:  CBC: Recent Labs  Lab 07/01/19 1034 07/07/19 1104  WBC 6.6 13.7*  HGB 13.1 11.3*  HCT 40.8 33.7*  MCV 94.2 92.1  PLT 170 119*    Basic Metabolic Panel: Recent Labs  Lab 07/01/19 1034 07/07/19 1104  NA 139 142  K 4.5 5.7*  CL 101 109  CO2 27 24  GLUCOSE 84 113*  BUN 18 11  CREATININE 1.31* 0.96  CALCIUM 9.1 8.6*   GFR Estimated Creatinine Clearance: 65.8 mL/min (by C-G formula based on SCr of 0.96 mg/dL). Liver Function Tests: No results for input(s): AST, ALT, ALKPHOS, BILITOT, PROT, ALBUMIN in the last 168 hours. No results for input(s): LIPASE, AMYLASE in the last 168 hours. No results for input(s): AMMONIA in the last 168 hours. Coagulation profile Recent Labs  Lab 07/01/19 1034  INR 1.0    Cardiac Enzymes: No results for input(s): CKTOTAL, CKMB, CKMBINDEX, TROPONINI in the last 168 hours. BNP: Invalid input(s): POCBNP CBG: Recent Labs  Lab 07/06/19 2059 07/07/19 0405 07/07/19 0854 07/07/19 1242  07/07/19 1538  GLUCAP 123* 111* 102* 90 68*   D-Dimer No results for input(s): DDIMER in the last 72 hours. Hgb A1c Recent Labs    07/05/19 1720  HGBA1C 5.0   Lipid Profile No results for input(s): CHOL, HDL, LDLCALC, TRIG, CHOLHDL, LDLDIRECT in the last 72 hours. Thyroid function studies No results for input(s): TSH, T4TOTAL, T3FREE, THYROIDAB in the last 72 hours.  Invalid input(s): FREET3 Anemia work up No results for input(s): VITAMINB12, FOLATE, FERRITIN, TIBC, IRON, RETICCTPCT in the last 72 hours. Urinalysis    Component Value Date/Time   COLORURINE YELLOW (A) 07/01/2019 1034   APPEARANCEUR CLEAR (A) 07/01/2019 1034   APPEARANCEUR Clear 04/19/2019 1025   LABSPEC 1.015 07/01/2019 1034   PHURINE 7.0 07/01/2019 1034   GLUCOSEU NEGATIVE 07/01/2019  [place] 07/01/2019 [place] 07/01/2019 1034   BILIRUBINUR Negative 04/19/2019 [place] 07/01/2019 [place] 07/01/2019 1034   NITRITE NEGATIVE 07/01/2019 [place] 07/01/2019 1034     Microbiology Recent Results (from the past 240 hour(s))  Surgical pcr screen     Status: Abnormal   Collection Time: 07/01/19 11:00 AM   Specimen: Nasal Mucosa; Nasal Swab  Result Value Ref Range Status   MRSA, PCR NEGATIVE NEGATIVE Final   Staphylococcus aureus POSITIVE (A) NEGATIVE Final    Comment: (NOTE) The Xpert SA Assay (FDA approved for NASAL specimens in patients 68 years of age and older), is one component of a comprehensive surveillance program. It is not intended to diagnose infection nor to guide or monitor treatment. Performed at [facility], [place], [place] [address]   SARS CORONAVIRUS 2 (TAT 6-24 HRS)     Status: None   Collection Time: 07/01/19 12:20 PM  Result Value Ref Range Status   SARS Coronavirus 2 NEGATIVE NEGATIVE Final    Comment: (NOTE) SARS-CoV-2 target nucleic acids are NOT DETECTED. The  SARS-CoV-2 RNA is generally detectable in upper and lower respiratory specimens during the acute phase of infection. Negative results do not preclude SARS-CoV-2 infection, do not rule out co-infections with other pathogens, and should not be used as the sole basis for treatment or other patient management decisions. Negative results must be combined with clinical observations, patient history, and epidemiological information. The expected result is Negative. Fact Sheet for Patients: SugarRoll.be Fact Sheet for Healthcare Providers: https://www.woods-mathews.com/ This test is not yet approved or cleared by the [place] FDA and  has been authorized for detection and/or diagnosis of SARS-CoV-2 by FDA under an Emergency Use Authorization (EUA). This EUA will remain  in effect (meaning this test can be used) for the duration of the COVID-19 declaration under Section 56 4(b)(1) of the Act, 21 U.S.C. section 360bbb-3(b)(1), unless the authorization is terminated or revoked sooner. Performed at [facility], [place] [address], [place], [place] [address]   MRSA PCR Screening     Status: None   Collection Time: 07/07/19  4:20 AM   Specimen: Nasal Mucosa; Nasopharyngeal  Result Value Ref Range Status   MRSA by PCR NEGATIVE NEGATIVE Final    Comment:        The GeneXpert MRSA Assay (FDA approved for NASAL specimens only), is one component of a comprehensive MRSA colonization surveillance program. It is not intended to diagnose MRSA infection nor to guide or monitor treatment for MRSA infections. Performed at [facility], [address] [place], [place] [address]        Inpatient Medications:   Scheduled Meds: . chlorhexidine  15 mL Mouth Rinse BID  . Chlorhexidine Gluconate Cloth  6 each Topical Daily  . dicyclomine  20 mg Oral BID  .  HYDROmorphone (DILAUDID) injection  1 mg Intravenous Once  . insulin aspart  0-15  Units Subcutaneous TID WC  . lidocaine  2 patch Transdermal Q24H  . mouth rinse  15 mL Mouth Rinse q12n4p  . morphine  30 mg Oral Q12H  . senna  1 tablet Oral BID  . sodium chloride flush  3 mL Intravenous Q12H  . sodium polystyrene  30 g Rectal Once  . sucralfate  1 g Oral BID  . traZODone  100 mg Oral QHS   Continuous Infusions: . sodium  chloride Stopped (07/06/19 0218)  . sodium chloride 75 mL/hr at 07/07/19 1351     Radiological Exams on Admission: DG Cervical Spine 2 or 3 views  Result Date: 07/06/2019 CLINICAL DATA:  Postoperative cervical fusion EXAM: CERVICAL SPINE - 2-3 VIEW COMPARISON:  07/05/2019, 04/01/2019 FINDINGS: Status post prior C4-C7 ACDF. Posterior spinal fusion hardware from C5-T2. Hardware appears well seated and in good alignment. Surgical drain is present. Prevertebral soft tissues are within normal limits. IMPRESSION: Satisfactory postoperative appearance of C4-C7 ACDF and C5-T2 posterior spinal fusion. Electronically Signed   By: Davina Poke D.O.   On: 07/06/2019 11:51   CT CERVICAL SPINE WO CONTRAST  Result Date: 07/06/2019 CLINICAL DATA:  Severe neck pain. Neck surgery 07/05/2019 including C6 and C7 laminectomy and posterior hardware fusion C5 through T2. Prior ACDF C4 through C7. EXAM: CT CERVICAL SPINE WITHOUT CONTRAST TECHNIQUE: Multidetector CT imaging of the cervical spine was performed without intravenous contrast. Multiplanar CT image reconstructions were also generated. COMPARISON:  Cervical MRI 04/01/2019. Cervical spine radiographs 07/06/2019 FINDINGS: Alignment: Normal Skull base and vertebrae: Negative for fracture. ACDF C4 through C7. Interval posterior fusion C4 through T1 with laminectomy at C6 and C7. Soft tissues and spinal canal: Posterior drain is present in the left epidural space at C6-7. This could also be an infusion catheter. No soft tissue hematoma or mass. Few gas bubbles in the posterior soft tissues due to recent surgery. Disc levels:   C2-3: Mild disc degeneration.  Negative for stenosis C3-4: Disc degeneration and spondylosis with diffuse uncinate spurring causing moderate foraminal stenosis bilaterally. Mild spinal canal narrowing. C4-5: ACDF. No solid bony union is present. Diffuse uncinate spurring causing moderate to severe foraminal encroachment bilaterally. Mild spinal stenosis. C5-6: ACDF. Persistent lucency through the disc space without bony union. Diffuse uncinate spurring causing mild to moderate foraminal stenosis bilaterally. Posterior decompression. Posterior fusion hardware in good position C6-7: ACDF. Persistent lucency through the disc space compatible with pseudarthrosis. Mild to moderate foraminal stenosis bilaterally due to uncinate spurring. Posterior laminectomy. Posterior hardware fusion in good position. C7-T1: Negative for spinal or foraminal stenosis. Bilateral pedicle screw fusion in good position. T1-2: Bilateral pedicle screws in good position. Negative for stenosis. Upper chest: Lung apices clear bilaterally. Other: None IMPRESSION: Interval posterior screw fusion from C5 through T1. Hardware in good position. Decompressive laminectomy at C5-6 and C6-7 ACDF at C4-5, C5-6, C6-7. No bony fusion is documented at these 3 fusion levels. There is significant foraminal stenosis at multiple levels in the cervical spine due to uncinate spurring. Electronically Signed   By: Franchot Gallo M.D.   On: 07/06/2019 13:47   DG Chest Port 1 View  Result Date: 07/07/2019 CLINICAL DATA:  Shortness of breath, hypertension EXAM: PORTABLE CHEST 1 VIEW COMPARISON:  None. FINDINGS: Heart size is within normal limits. Low lung volumes. Bilateral perihilar and left basilar linear atelectasis. Additional somewhat streaky bibasilar opacities. No pleural effusion or pneumothorax. Fixation hardware noted at the lower thoracic spine and cervicothoracic junction. IMPRESSION: Low lung volumes with bilateral perihilar and left basilar linear  atelectasis. Additional streaky bibasilar opacities which may reflect atelectasis versus developing infiltrate. Electronically Signed   By: Davina Poke D.O.   On: 07/07/2019 11:24    Impression/Recommendations Principal Problem:   Acute respiratory failure with hypoxia (HCC) Active Problems:   Chronic pain syndrome   Major depressive disorder with current active episode   S/P cervical spinal fusion   HTN (hypertension)   Acute metabolic encephalopathy   Hypoglycemia  Gastric ulcer   Acute respiratory failure with hypoxia [facility]): Etiology is not clear.  Potential differential diagnosis is overall use of pain medication and sedation.  CXR showed low lung volumes with bilateral perihilar and left basilar linear atelectasis. Additional streaky bibasilar opacities which may reflect atelectasis versus developing infiltrate.  Patient has leukocytosis with WBC 13.7. cannot rule out pneumonia. Will start antibiotics empirically. BNP 244.  -will hold off MS Contin - give 0.1 mg of Narcan -Hold Lexapro, Wellbutrin, Lyrica, Robaxin -Nasal cannula oxygen to maintain oxygen saturation 93% -As needed albuterol -Incentive spirometry -Vanco and zosyn. -get blood culture  Chronic pain syndrome: -hold pain narcotics now.  Major depressive disorder with current active episode: -hold home meds  HTN (hypertension): -IV hydralazine as needed  S/P cervical spinal fusion: -hold narcotic pain meds and Lyrica, Robaxin  Hx of gastric ulcer: -Carafate  Acute metabolic encephalopathy: Possibly due to sedation. -Frequent neuro check -Hold sedative medications as above - f/u CT head  Hypoglycemia: Blood sugar 68 -Check blood sugar every 2 hour -As needed D50       Thank you for this consultation.  Our [facility] hospitalist team will follow the patient with you.   Time Spent:  30 min  Ivor Costa M.D. [facility] 07/07/2019, 4:19 PM

## 2019-07-07 NOTE — Progress Notes (Signed)
Procedure: C5-T2 posterior decompression and fusion  Procedure date: 07/05/2019 Diagnosis: cervical myelopathy, arthrodesis   History: Lindsay Austin is s/p C5-T2 posterior cervical decompression and fusion   POD2: Transferred to ICU overnight due to declining O2 levels despite receiving oxygen via nasal cannula. Bipap in place. Unable to rate pain specifically but states that it is "high". Denies any new upper or lower extremity pain/numbness/tingling.   POD1: Pain 7-8/10. Hand numbness and tingling have improved. Complains of sharp pain in left upper arm.  Foley removed - she have not voided yet.   POD0: Tolerated procedure well. Evaluated in post op recovery still disoriented from anesthesia but able to answer questions and obey commands. Complains of pain in posterior cervical spine. Unable to determine if any improvement in upper extremities. Denies new pain/numbness/tingling in upper or lower extremities.   Physical Exam: Vitals:   07/07/19 0006 07/07/19 0015  BP: (!) 112/58 124/81  Pulse: (!) 116 (!) 108  Resp: 15   Temp: 99 F (37.2 C)   SpO2: (!) 83% 96%    General: Alert and oriented - laying in hospital bed - Bipap present. Mitts on Strength:5/5 throughout upper and lower extremities. Pain with upper extremity testing.  Sensation: intact and symmetric throughout upper and lower extremities.    Data:  Recent Labs  Lab 07/01/19 1034  NA 139  K 4.5  CL 101  CO2 27  BUN 18  CREATININE 1.31*  GLUCOSE 84  CALCIUM 9.1   No results for input(s): AST, ALT, ALKPHOS in the last 168 hours.  Invalid input(s): TBILI   Recent Labs  Lab 07/01/19 1034  WBC 6.6  HGB 13.1  HCT 40.8  PLT 170   Recent Labs  Lab 07/01/19 1034  APTT 33  INR 1.0         Other tests/results:   EXAM: CT CERVICAL SPINE WITHOUT CONTRAST 07/06/2019  FINDINGS: Alignment: Normal  Skull base and vertebrae: Negative for fracture. ACDF C4 through C7. Interval posterior fusion C4  through T1 with laminectomy at C6 and C7.  Soft tissues and spinal canal: Posterior drain is present in the left epidural space at C6-7. This could also be an infusion catheter.  No soft tissue hematoma or mass. Few gas bubbles in the posterior soft tissues due to recent surgery.  Disc levels:  C2-3: Mild disc degeneration.  Negative for stenosis  C3-4: Disc degeneration and spondylosis with diffuse uncinate spurring causing moderate foraminal stenosis bilaterally. Mild spinal canal narrowing.  C4-5: ACDF. No solid bony union is present. Diffuse uncinate spurring causing moderate to severe foraminal encroachment bilaterally. Mild spinal stenosis.  C5-6: ACDF. Persistent lucency through the disc space without bony union. Diffuse uncinate spurring causing mild to moderate foraminal stenosis bilaterally. Posterior decompression. Posterior fusion hardware in good position  C6-7: ACDF. Persistent lucency through the disc space compatible with pseudarthrosis. Mild to moderate foraminal stenosis bilaterally due to uncinate spurring. Posterior laminectomy. Posterior hardware fusion in good position.  C7-T1: Negative for spinal or foraminal stenosis. Bilateral pedicle screw fusion in good position.  T1-2: Bilateral pedicle screws in good position. Negative for stenosis.  Upper chest: Lung apices clear bilaterally.  Other: None  IMPRESSION: Interval posterior screw fusion from C5 through T1. Hardware in good position. Decompressive laminectomy at C5-6 and C6-7  ACDF at C4-5, C5-6, C6-7. No bony fusion is documented at these 3 fusion levels. There is significant foraminal stenosis at multiple levels in the cervical spine due to uncinate spurring.  EXAM: CERVICAL SPINE - 2-3 VIEW 07/06/2019 COMPARISON:  07/05/2019, 04/01/2019  FINDINGS: Status post prior C4-C7 ACDF. Posterior spinal fusion hardware from C5-T2. Hardware appears well seated and in good  alignment. Surgical drain is present. Prevertebral soft tissues are within normal limits.  IMPRESSION: Satisfactory postoperative appearance of C4-C7 ACDF and C5-T2 posterior spinal fusion. Assessment/Plan:  Lindsay Austin is POD2 s/p C5-T2 posterior cervical decompression and fusion. Will continue to monitor  - monitor drain output - 0 ml - mobilize - pain control - on chronic morphine 60 mg every 12 hours. Increases to 90 mg every 12 hours.  - DVT prophylaxis - PTOT - Brace -Imaging - post op films complete. CT imaging complete (upper extremity pain).   Marin Olp PA-C Department of Neurosurgery

## 2019-07-07 NOTE — Progress Notes (Signed)
Transported to CCU on BIPAP.

## 2019-07-07 NOTE — Progress Notes (Signed)
Pt. Placed on BIPAP 10/5 35%. Tolerating well. Will continue to assess.

## 2019-07-07 NOTE — Progress Notes (Signed)
Patient upon assessment remains lethargic.  She was wearing BiPAP this morning @ 28% FiO2.  Saturating in the high 90's.  BiPAP was removed.  Patient is mouth breathing and still lethargic.  Her vitals are WDL. However she is desaturating d/t her mouth breathing.  I placed her on a venturi mask at 3L 28%FiO2.  Patient eyes are brisk around 33mm.  Heart heart sounds S1S2.  Lung sounds are clear.  Her belly sounds upon auscultation are active.  Bladder scan this morning by night shift 0600 recorded 246ml of urine with no output since reading.  I will bladder scan again and in/out cath if necessary.  If bladder retention persists, I will introduce indwelling foley.  Spoke with daughter on the phone this morning and explained BiPAP therapy.  Using caution with swallowing as patient is lethargic and slurring her words and she has significant PO medicines for this morning.  Blood sugar recorded 109. Informed physician of patients current state, awaiting response.

## 2019-07-07 NOTE — Progress Notes (Signed)
Patient transported to CCU on BIPAP, report given to RN, daughter Wilma Flavin contacted and made aware of transport, verbalized understanding.

## 2019-07-07 NOTE — Progress Notes (Signed)
OT Cancellation Note  Patient Details Name: Lindsay Austin MRN: [id number] DOB: [birth year]   Cancelled Treatment:    Reason Eval/Treat Not Completed: Fatigue/lethargy limiting ability to participate;Medical issues which prohibited therapy  Upon observation, speaking with RN, and chart review of pt this AM, she appears lethargic with some slurring of speech noted. Pt also with venturi mask when OT presents. Pt does not appear appropriate for therapy at this time. Will f/u for treatment as pt becomes more appropriate for therapy participation. Thank you.  Gerrianne Scale, [place], OTR/L ascom [phone] 07/07/19, 10:30 AM

## 2019-07-08 ENCOUNTER — Inpatient Hospital Stay (HOSPITAL_COMMUNITY)
Admission: RE | Admit: 2019-07-08 | Discharge: 2019-07-08 | Disposition: A | Discharge status: Home / Self Care | Payer: Medicaid Other | Source: Home / Self Care | Attending: Internal Medicine | Admitting: Internal Medicine

## 2019-07-08 DIAGNOSIS — I361 Nonrheumatic tricuspid (valve) insufficiency: Secondary | ICD-10-CM

## 2019-07-08 DIAGNOSIS — J9601 Acute respiratory failure with hypoxia: Secondary | ICD-10-CM

## 2019-07-08 DIAGNOSIS — I340 Nonrheumatic mitral (valve) insufficiency: Secondary | ICD-10-CM

## 2019-07-08 LAB — CBC WITH DIFFERENTIAL/PLATELET
Abs Immature Granulocytes: 0.03 10*3/uL (ref 0.00–0.07)
Basophils Absolute: 0 10*3/uL (ref 0.0–0.1)
Basophils Relative: 0 %
Eosinophils Absolute: 0 10*3/uL (ref 0.0–0.5)
Eosinophils Relative: 1 %
HCT: 30.7 % — ABNORMAL LOW (ref 36.0–46.0)
Hemoglobin: 10.2 g/dL — ABNORMAL LOW (ref 12.0–15.0)
Immature Granulocytes: 0 %
Lymphocytes Relative: 14 %
Lymphs Abs: 1.1 10*3/uL (ref 0.7–4.0)
MCH: 31.1 pg (ref 26.0–34.0)
MCHC: 33.2 g/dL (ref 30.0–36.0)
MCV: 93.6 fL (ref 80.0–100.0)
Monocytes Absolute: 0.6 10*3/uL (ref 0.1–1.0)
Monocytes Relative: 8 %
Neutro Abs: 5.8 10*3/uL (ref 1.7–7.7)
Neutrophils Relative %: 77 %
Platelets: 135 10*3/uL — ABNORMAL LOW (ref 150–400)
RBC: 3.28 MIL/uL — ABNORMAL LOW (ref 3.87–5.11)
RDW: 12.9 % (ref 11.5–15.5)
WBC: 7.6 10*3/uL (ref 4.0–10.5)
nRBC: 0 % (ref 0.0–0.2)

## 2019-07-08 LAB — COMPREHENSIVE METABOLIC PANEL
ALT: 14 U/L (ref 0–44)
AST: 18 U/L (ref 15–41)
Albumin: 3.1 g/dL — ABNORMAL LOW (ref 3.5–5.0)
Alkaline Phosphatase: 40 U/L (ref 38–126)
Anion gap: 6 (ref 5–15)
BUN: 10 mg/dL (ref 6–20)
CO2: 32 mmol/L (ref 22–32)
Calcium: 8 mg/dL — ABNORMAL LOW (ref 8.9–10.3)
Chloride: 105 mmol/L (ref 98–111)
Creatinine, Ser: 0.85 mg/dL (ref 0.44–1.00)
GFR calc Af Amer: >60 mL/min (ref >60)
GFR calc non Af Amer: >60 mL/min (ref >60)
Glucose, Bld: 89 mg/dL (ref 70–99)
Potassium: 3.1 mmol/L — ABNORMAL LOW (ref 3.5–5.1)
Sodium: 143 mmol/L (ref 135–145)
Total Bilirubin: 0.8 mg/dL (ref 0.3–1.2)
Total Protein: 5.7 g/dL — ABNORMAL LOW (ref 6.5–8.1)

## 2019-07-08 LAB — TROPONIN I (HIGH SENSITIVITY): Troponin I (High Sensitivity): 154 ng/L (ref <18)

## 2019-07-08 LAB — PROCALCITONIN: Procalcitonin: <0.1 ng/mL

## 2019-07-08 LAB — ECHOCARDIOGRAM COMPLETE

## 2019-07-08 LAB — MAGNESIUM: Magnesium: 1.9 mg/dL (ref 1.7–2.4)

## 2019-07-08 LAB — GLUCOSE, CAPILLARY: Glucose-Capillary: 73 mg/dL (ref 70–99)

## 2019-07-08 MED ORDER — PREGABALIN 50 MG PO CAPS
50.0000 mg | ORAL_CAPSULE | Freq: Two times a day (BID) | ORAL | Status: DC
  Administered 2019-07-08 – 2019-07-10 (×5): 50 mg via ORAL
  Filled 2019-07-08 (×5): qty 1

## 2019-07-08 MED ORDER — HYDROMORPHONE HCL 1 MG/ML IJ SOLN
0.5000 mg | Freq: Once | INTRAMUSCULAR | Status: AC
  Administered 2019-07-08: 0.5 mg via INTRAVENOUS

## 2019-07-08 MED ORDER — ACETAMINOPHEN 650 MG RE SUPP: 650.0000 mg | RECTAL | Status: DC

## 2019-07-08 MED ORDER — TRAMADOL HCL 50 MG PO TABS
50.0000 mg | ORAL_TABLET | Freq: Four times a day (QID) | ORAL | Status: DC | PRN
  Administered 2019-07-08 – 2019-07-10 (×4): 50 mg via ORAL
  Filled 2019-07-08 (×4): qty 1

## 2019-07-08 MED ORDER — ACETAMINOPHEN 325 MG PO TABS
650.0000 mg | ORAL_TABLET | ORAL | Status: DC
  Administered 2019-07-08 – 2019-07-10 (×9): 650 mg via ORAL
  Filled 2019-07-08 (×9): qty 2

## 2019-07-08 MED ORDER — HYDROMORPHONE HCL 1 MG/ML IJ SOLN
INTRAMUSCULAR | Status: AC
  Filled 2019-07-08: qty 1

## 2019-07-08 MED ORDER — POTASSIUM CHLORIDE CRYS ER 20 MEQ PO TBCR
40.0000 meq | EXTENDED_RELEASE_TABLET | ORAL | Status: AC
  Administered 2019-07-08 (×2): 40 meq via ORAL
  Filled 2019-07-08 (×2): qty 2

## 2019-07-08 MED ORDER — AMOXICILLIN-POT CLAVULANATE 875-125 MG PO TABS
1.0000 | ORAL_TABLET | Freq: Two times a day (BID) | ORAL | Status: DC
  Administered 2019-07-08 – 2019-07-09 (×4): 1 via ORAL
  Filled 2019-07-08 (×5): qty 1

## 2019-07-08 MED ORDER — ENOXAPARIN SODIUM 40 MG/0.4ML ~~LOC~~ SOLN
40.0000 mg | SUBCUTANEOUS | Status: DC
  Administered 2019-07-08 – 2019-07-09 (×2): 40 mg via SUBCUTANEOUS
  Filled 2019-07-08 (×2): qty 0.4

## 2019-07-08 NOTE — Evaluation (Signed)
Physical Therapy Re-Evaluation Patient Details Name: Lindsay Austin MRN: [id number] DOB: Apr 14, 1961 Today's Date: 07/08/2019   History of Present Illness  59 y/o F with PMH: cervical stenosis at C6/7 after prior C4-7 ACDF. She has had incomplete fusion at C6/7 and symptoms of numbness and weakness in hands. Pt now s/p arthrodesis, posterior Cervical C5 - T2, lateral mass screws at C5/C6, pedicle screw fixation at T1, T2 and C6, C7 laminectomy on 07/05/2019. Hospital course complicated by transfer to CCU due to acute hypoxic/hypercarbic respiratory failure, acute toxic encephalopathy due to polypharmacy; cleared to resume therapy services.  Clinical Impression  Patient cleared for and agreeable to re-evaluation this date.  Endorses significant pain in cervical region, L shoulder; meds requested and administered per RN during session. Noted difficulty with shoulder/UE elevation; generalized weakness noted throughout all extremities, primarily limited by pain.  Currently requiring mod assist for bed mobility; min/mod assist for sit/stand, basic transfers and short-distance gait (5' x2) with RW.  Patient very guarded and fearful of LE buckling, but none noted during session. Additional gait/mobility limited by pain at this time.  Will continue to assess/progress as medically appropriate. Of note, vitals stable and WFL throughout session. Would benefit from skilled PT to address above deficits and promote optimal return to PLOF.; Recommend transition to HHPT upon discharge from acute hospitalization.     Follow Up Recommendations Home health PT    Equipment Recommendations  Rolling walker with 5" wheels;3in1 (PT)    Recommendations for Other Services       Precautions / Restrictions Precautions Precautions: Cervical;Fall Required Braces or Orthoses: Cervical Brace Restrictions Weight Bearing Restrictions: No      Mobility  Bed Mobility Overal bed mobility: Needs Assistance Bed Mobility: Supine  to Sit   Sidelying to sit: Min assist Supine to sit: Mod assist     General bed mobility comments: assist for truncal elevation  Transfers Overall transfer level: Needs assistance Equipment used: Rolling walker (2 wheeled) Transfers: Sit to/from Stand Sit to Stand: Min assist         General transfer comment: requires use of UEs (but difficulty with placement on RW), assist for lift off and steadying  Ambulation/Gait Ambulation/Gait assistance: Min assist;Mod assist Gait Distance (Feet): (5' x2) Assistive device: Rolling walker (2 wheeled)       General Gait Details: broad BOS with decreased step height/length; very slow, cautious and guarded; fearful of buckling, but no buckling noted during session  Stairs            Wheelchair Mobility    Modified Rankin (Stroke Patients Only)       Balance Overall balance assessment: Needs assistance Sitting-balance support: No upper extremity supported;Feet supported Sitting balance-Leahy Scale: Good     Standing balance support: Bilateral upper extremity supported Standing balance-Leahy Scale: Fair                               Pertinent Vitals/Pain Pain Assessment: Faces Faces Pain Scale: Hurts whole lot Pain Location: cervical, L shoulder area Pain Descriptors / Indicators: Aching;Shooting;Guarding;Grimacing Pain Intervention(s): Limited activity within patient's tolerance;Monitored during session;Repositioned;Patient requesting pain meds-RN notified;RN gave pain meds during session    [place] expects to be discharged to:: Private residence   Available Help at Discharge: Family;Available PRN/intermittently Type of Home: House Home Access: Stairs to enter Entrance Stairs-Rails: Can reach both Entrance Stairs-Number of Steps: 4 Home Layout: Two level;Able to live on main level  with bedroom/bathroom Home Equipment: Walker - 4 wheels;Cane - quad;Adaptive equipment Additional  Comments: pt endorses >5 falls in a year    Prior Function Level of Independence: Independent with assistive device(s)         Comments: Pt states she uses a walker or cane all the time, has had 10+ falls in the last 6 months but able to care for 2 young grandchildren     Hand Dominance   Dominant Hand: Right    Extremity/Trunk Assessment   Upper Extremity Assessment Upper Extremity Assessment: Generalized weakness(generally limited by pain with active shoulder elevation; chronic paresthesia L > R UE)    Lower Extremity Assessment Lower Extremity Assessment: Generalized weakness(grossly at least 4-/5 throughout)       Communication   Communication: No difficulties  Cognition Arousal/Alertness: Awake/alert Behavior During Therapy: WFL for tasks assessed/performed Overall Cognitive Status: Within Functional Limits for tasks assessed                                        General Comments      Exercises Other Exercises Other Exercises: Toilet transfer, SPT with RW, min/mod assist; sit/stand with RW, min/mod assist; dep for hygiene and peri-care.  Difficulty with functional use of UEs due to cervical pain with attempted movement. Other Exercises: Reviewed cervical precautions and functional implications; min cuing for adherence during session. Other Exercises: Dep for donning cervical brace, sitting edge of bed   Assessment/Plan    PT Assessment Patient needs continued PT services  PT Problem List Decreased strength;Decreased activity tolerance;Decreased balance;Decreased mobility;Decreased knowledge of use of DME;Decreased safety awareness;Decreased range of motion;Pain;Decreased knowledge of precautions;Decreased coordination       PT Treatment Interventions Gait training;Stair training;DME instruction;Functional mobility training;Therapeutic activities;Therapeutic exercise;Balance training;Neuromuscular re-education;Patient/family education    PT  Goals (Current goals can be found in the Care Plan section)  Acute Rehab PT Goals Patient Stated Goal: to improve pain PT Goal Formulation: With patient Time For Goal Achievement: 07/22/19 Potential to Achieve Goals: Good    Frequency BID   Barriers to discharge        Co-evaluation               AM-PAC PT "6 Clicks" Mobility  Outcome Measure Help needed turning from your back to your side while in a flat bed without using bedrails?: A Little Help needed moving from lying on your back to sitting on the side of a flat bed without using bedrails?: A Lot Help needed moving to and from a bed to a chair (including a wheelchair)?: A Little Help needed standing up from a chair using your arms (e.g., wheelchair or bedside chair)?: A Little Help needed to walk in hospital room?: A Little Help needed climbing 3-5 steps with a railing? : A Lot 6 Click Score: 16    End of Session Equipment Utilized During Treatment: Gait belt Activity Tolerance: Patient limited by pain Patient left: with chair alarm set;with call bell/phone within reach   PT Visit Diagnosis: Muscle weakness (generalized) (M62.81);Difficulty in walking, not elsewhere classified (R26.2);Other abnormalities of gait and mobility (R26.89);Pain;Other symptoms and signs involving the nervous system (R29.898)    Time: [id number] PT Time Calculation (min) (ACUTE ONLY): 33 min   Charges:   PT Evaluation $PT Re-evaluation: 1 Re-eval PT Treatments $Therapeutic Activity: 23-37 mins        Maysen Bonsignore H. Owens Shark, PT, DPT, NCS  07/08/19, 10:42 AM [phone]

## 2019-07-08 NOTE — Progress Notes (Signed)
Procedure: C5-T2 posterior decompression and fusion  Procedure date: 07/05/2019 Diagnosis: cervical myelopathy, arthrodesis   History: Lindsay Austin is s/p C5-T2 posterior cervical decompression and fusion   POD3: She is recovering well. Tolerated room air this AM and through the day without issue. Still complains of 7/10 posterior neck pain.Received PT today and spent time in the hospital chair. Foley still present - states she is incontinent at baseline. Denies chest pain, shortness of breath, new upper or lower extremity pain/numbness/tingling, weakness.   POD2: Transferred to ICU overnight due to declining O2 levels despite receiving oxygen via nasal cannula. Bipap in place. Unable to rate pain specifically but states that it is "high". Denies any new upper or lower extremity pain/numbness/tingling.   POD1: Pain 7-8/10. Hand numbness and tingling have improved. Complains of sharp pain in left upper arm.  Foley removed - she have not voided yet.   POD0: Tolerated procedure well. Evaluated in post op recovery still disoriented from anesthesia but able to answer questions and obey commands. Complains of pain in posterior cervical spine. Unable to determine if any improvement in upper extremities. Denies new pain/numbness/tingling in upper or lower extremities.   Physical Exam: Vitals:   07/08/19 1942 07/08/19 2000  BP: (!) 112/93 (!) 155/79  Pulse: 68 65  Resp: 17 14  Temp:    SpO2: 96% 95%    General: Awake, Alert and oriented - laying in hospital bed, appears comfortable.   Strength:5/5 throughout lower extremities. Pain with left upper extremity testing. 5/5 throughout right upper extremity. 5/5 left bicep, tricep. At least 4/5 deltoid but difficult to assess due to pain limitation   Sensation: intact and symmetric throughout upper and lower extremities.   Skin: minimal dressing saturation. No active bleeding at drain insertion site.   Data:  Recent Labs  Lab 07/07/19 1104  07/07/19 1731 07/08/19 0726  NA 142  --  143  K 5.7*   < > 3.1*  CL 109  --  105  CO2 24  --  32  BUN 11  --  10  CREATININE 0.96  --  0.85  GLUCOSE 113*  --  89  CALCIUM 8.6*  --  8.0*   < > = values in this interval not displayed.   Recent Labs  Lab 07/08/19 0726  AST 18  ALT 14  ALKPHOS 40     Recent Labs  Lab 07/07/19 1104 07/07/19 1104 07/08/19 0726  WBC 13.7*   < > 7.6  HGB 11.3*   < > 10.2*  HCT 33.7*   < > 30.7*  PLT 119*  --  135*   < > = values in this interval not displayed.   No results for input(s): APTT, INR in the last 168 hours.       Other tests/results:   EXAM: CT CERVICAL SPINE WITHOUT CONTRAST 07/06/2019  FINDINGS: Alignment: Normal  Skull base and vertebrae: Negative for fracture. ACDF C4 through C7. Interval posterior fusion C4 through T1 with laminectomy at C6 and C7.  Soft tissues and spinal canal: Posterior drain is present in the left epidural space at C6-7. This could also be an infusion catheter.  No soft tissue hematoma or mass. Few gas bubbles in the posterior soft tissues due to recent surgery.  Disc levels:  C2-3: Mild disc degeneration.  Negative for stenosis  C3-4: Disc degeneration and spondylosis with diffuse uncinate spurring causing moderate foraminal stenosis bilaterally. Mild spinal canal narrowing.  C4-5: ACDF. No solid bony union is  present. Diffuse uncinate spurring causing moderate to severe foraminal encroachment bilaterally. Mild spinal stenosis.  C5-6: ACDF. Persistent lucency through the disc space without bony union. Diffuse uncinate spurring causing mild to moderate foraminal stenosis bilaterally. Posterior decompression. Posterior fusion hardware in good position  C6-7: ACDF. Persistent lucency through the disc space compatible with pseudarthrosis. Mild to moderate foraminal stenosis bilaterally due to uncinate spurring. Posterior laminectomy. Posterior hardware fusion in good  position.  C7-T1: Negative for spinal or foraminal stenosis. Bilateral pedicle screw fusion in good position.  T1-2: Bilateral pedicle screws in good position. Negative for stenosis.  Upper chest: Lung apices clear bilaterally.  Other: None  IMPRESSION: Interval posterior screw fusion from C5 through T1. Hardware in good position. Decompressive laminectomy at C5-6 and C6-7  ACDF at C4-5, C5-6, C6-7. No bony fusion is documented at these 3 fusion levels. There is significant foraminal stenosis at multiple levels in the cervical spine due to uncinate spurring.     EXAM: CERVICAL SPINE - 2-3 VIEW 07/06/2019 COMPARISON:  07/05/2019, 04/01/2019  FINDINGS: Status post prior C4-C7 ACDF. Posterior spinal fusion hardware from C5-T2. Hardware appears well seated and in good alignment. Surgical drain is present. Prevertebral soft tissues are within normal limits.  IMPRESSION: Satisfactory postoperative appearance of C4-C7 ACDF and C5-T2 posterior spinal fusion. Assessment/Plan:  Lindsay Austin is POD3 s/p C5-T2 posterior cervical decompression and fusion. Will continue to monitor  - monitor drain output - 100 overnight - mobilize - pain control - regimen per hospitalist recommendations. - Appreciate participation in patient care - DVT prophylaxis - lovenox - PTOT - Brace  -Imaging - post op films complete. CT imaging complete (upper extremity pain).   Marin Olp PA-C Department of Neurosurgery

## 2019-07-08 NOTE — Progress Notes (Signed)
PT Cancellation Note  Patient Details Name: Lindsay Austin MRN: [id number] DOB: 04/18/1961   Cancelled Treatment:    Reason Eval/Treat Not Completed: (Treatment session attempted.  Patient declining participation with session secondary to "cold, nauseous and in pain".  Unable to facilitate bed-level, OOB to chair or position change. Will continue efforts next date as medically appropriate.)   Ademola Vert H. Owens Shark, PT, DPT, NCS 07/08/19, 2:43 PM [phone]

## 2019-07-08 NOTE — Progress Notes (Signed)
*  PRELIMINARY RESULTS* Echocardiogram 2D Echocardiogram has been performed.  Sherrie Sport 07/08/2019, 2:35 PM

## 2019-07-08 NOTE — Progress Notes (Addendum)
[facility] Consultation Progress Note  Patient: Lindsay Austin [id number]   PCP: [name], [place] DOB: December 13, 1960   DOA: 07/05/2019   DOS: 07/08/2019   Date of Service: the patient was seen and examined on 07/08/2019 Primary service: Deetta Perla, MD    Brief hospital course: Pt. with PMH of HTN, HLD, GERD, depression, anxiety, chronic pain syndrome, fibromyalgia, trigeminal neuralgia, IBS, RSD; admitted on 07/05/2019, with complaint of back pain and underwent C5-T2 posterior cervical decompression and fusion on 07/05/2019, was found to have acute toxic encephalopathy and acute hypoxic and hypercarbic respiratory failure postoperatively on 07/07/2019 likely secondary to medications. Currently further plan is gradually uptitrate medication and rule out pneumonia as well as further cardiac work-up.  Subjective: Denies any chest pain or shortness of breath.  Denies any nausea or vomiting.  Had multiple bowel movements last night.  Unable to sleep last night.  No fever no chills.  Assessment and Plan: 1.  Acute metabolic encephalopathy from polypharmacy Patient's medications were increased recently secondary to her uncontrolled pain. ABG showed hypercarbia. Currently on room air. Gradually resume home pain medication at home dose. Patient is on Lexapro Wellbutrin Lyrica and Robaxin which is also currently on hold. We will resume Lyrica 50 mg twice daily dose. Continue incentive spirometry. Can go to telemetry.  2.  Concern for aspiration pneumonia. Chest x-ray does show evidence of possible infiltrate. Although clinically patient does not appear to have any pneumonia. Likely atelectasis. Continue antibiotic for now protection check procalcitonin level We will threshold to discontinue anybody. At worst case patient will require 5 days of treatment for antibiotic given recent surgery.  3.  Elevated troponin.  Likely from demand ischemia. No chest pain no shortness of breath no  angina. Check EKG. Troponins are stable. If possible add aspirin 81 mg. Check echocardiogram. No indication for inpatient cardiac work-up or cardiology consult for now given lack of symptoms and stability of the troponins.  Recommendation on discharge: Outpatient follow-up with PCP Gradual up titration of pain medication with neurosurgery and PCP  Diet: Cardiac diet DVT Prophylaxis: SCD, pharmacological prophylaxis Per neurosurgery  Family Communication: no family was present at bedside, at the time of interview.    Disposition: We will continue to follow the patient.   Other Consultants: none Procedures: Echocardiogram ordered  Antibiotics: Anti-infectives (From admission, onward)   Start     Dose/Rate Route Frequency Ordered Stop   07/07/19 1645  piperacillin-tazobactam (ZOSYN) IVPB 3.375 g     3.375 g 12.5 mL/hr over 240 Minutes Intravenous Every 8 hours 07/07/19 1635     07/05/19 1343  vancomycin (VANCOCIN) powder  Status:  Discontinued       As needed 07/05/19 1343 07/05/19 1445   07/05/19 0830  ceFAZolin (ANCEF) IVPB 2g/100 mL premix     2 g 200 mL/hr over 30 Minutes Intravenous  Once 07/05/19 0823 07/05/19 1058      Objective: Physical Exam: Vitals:   07/08/19 0600 07/08/19 0605 07/08/19 0700 07/08/19 0800  BP: (!) 142/73  138/76   Pulse: 60 (!) 56 72   Resp: 13 16 17    Temp:    98.3 F (36.8 C)  TempSrc:    Oral  SpO2: 100% 95% 97%     Intake/Output Summary (Last 24 hours) at 07/08/2019 0829 Last data filed at 07/08/2019 0800 Gross per 24 hour  Intake 830.59 ml  Output 1905 ml  Net -1074.41 ml   There were no vitals filed for this visit. General: alert and  oriented to time, place, and person. Appear in mild distress, affect appropriate Eyes: PERRL, Conjunctiva normal ENT: Oral Mucosa Clear, moist  Neck: no JVD, no Abnormal Mass Or lumps Cardiovascular: S1 and S2 Present, no Murmur, peripheral pulses symmetrical Respiratory: normal respiratory effort,  Bilateral Air entry equal and Decreased, no use of accessory muscle, Clear to Auscultation, no Crackles, no wheezes Abdomen: Bowel Sound present, Soft and no tenderness, no hernia Skin: no rashes  Extremities: no Pedal edema, no calf tenderness Neurologic: without any new focal findings Gait not checked due to patient safety concerns  Data Reviewed: CBC: Recent Labs  Lab 07/01/19 1034 07/07/19 1104 07/08/19 0726  WBC 6.6 13.7* 7.6  NEUTROABS  --   --  5.8  HGB 13.1 11.3* 10.2*  HCT 40.8 33.7* 30.7*  MCV 94.2 92.1 93.6  PLT 170 119* [id number]*   Basic Metabolic Panel: Recent Labs  Lab 07/01/19 1034 07/07/19 1104 07/07/19 1731 07/08/19 0726  NA 139 142  --  143  K 4.5 5.7* 3.8 3.1*  CL 101 109  --  105  CO2 27 24  --  32  GLUCOSE 84 113*  --  89  BUN 18 11  --  10  CREATININE 1.31* 0.96  --  0.85  CALCIUM 9.1 8.6*  --  8.0*  MG  --   --   --  1.9   Liver Function Tests: Recent Labs  Lab 07/08/19 0726  AST 18  ALT 14  ALKPHOS 40  BILITOT 0.8  PROT 5.7*  ALBUMIN 3.1*   No results for input(s): LIPASE, AMYLASE in the last 168 hours. No results for input(s): AMMONIA in the last 168 hours. Cardiac Enzymes: Recent Labs  Lab 07/07/19 1934  CKTOTAL 196  CKMB 5.1*   BNP (last 3 results) Recent Labs    07/07/19 1254  BNP 244.0*   CBG: Recent Labs  Lab 07/07/19 1242 07/07/19 1538 07/07/19 1649 07/07/19 2018 07/08/19 0738  GLUCAP 90 68* 126* 91 73   Recent Results (from the past 240 hour(s))  Surgical pcr screen     Status: Abnormal   Collection Time: 07/01/19 11:00 AM   Specimen: Nasal Mucosa; Nasal Swab  Result Value Ref Range Status   MRSA, PCR NEGATIVE NEGATIVE Final   Staphylococcus aureus POSITIVE (A) NEGATIVE Final    Comment: (NOTE) The Xpert SA Assay (FDA approved for NASAL specimens in patients 10 years of age and older), is one component of a comprehensive surveillance program. It is not intended to diagnose infection nor to guide or monitor  treatment. Performed at [facility], [place], [place] [address]   SARS CORONAVIRUS 2 (TAT 6-24 HRS)     Status: None   Collection Time: 07/01/19 12:20 PM  Result Value Ref Range Status   SARS Coronavirus 2 NEGATIVE NEGATIVE Final    Comment: (NOTE) SARS-CoV-2 target nucleic acids are NOT DETECTED. The SARS-CoV-2 RNA is generally detectable in upper and lower respiratory specimens during the acute phase of infection. Negative results do not preclude SARS-CoV-2 infection, do not rule out co-infections with other pathogens, and should not be used as the sole basis for treatment or other patient management decisions. Negative results must be combined with clinical observations, patient history, and epidemiological information. The expected result is Negative. Fact Sheet for Patients: SugarRoll.be Fact Sheet for Healthcare Providers: https://www.woods-mathews.com/ This test is not yet approved or cleared by the [place] FDA and  has been authorized for detection and/or diagnosis  of SARS-CoV-2 by FDA under an Emergency Use Authorization (EUA). This EUA will remain  in effect (meaning this test can be used) for the duration of the COVID-19 declaration under Section 56 4(b)(1) of the Act, 21 U.S.C. section 360bbb-3(b)(1), unless the authorization is terminated or revoked sooner. Performed at [facility], [place] [address], [place], [place] [address]   MRSA PCR Screening     Status: None   Collection Time: 07/07/19  4:20 AM   Specimen: Nasal Mucosa; Nasopharyngeal  Result Value Ref Range Status   MRSA by PCR NEGATIVE NEGATIVE Final    Comment:        The GeneXpert MRSA Assay (FDA approved for NASAL specimens only), is one component of a comprehensive MRSA colonization surveillance program. It is not intended to diagnose MRSA infection nor to guide or monitor treatment for MRSA infections. Performed at  [facility], [address] [place], [place] [address]   CULTURE, BLOOD (ROUTINE X 2) w Reflex to ID Panel     Status: None (Preliminary result)   Collection Time: 07/07/19  5:29 PM   Specimen: BLOOD  Result Value Ref Range Status   Specimen Description BLOOD BLOOD LEFT HAND  Final   Special Requests   Final    BOTTLES DRAWN AEROBIC AND ANAEROBIC Blood Culture results may not be optimal due to an inadequate volume of blood received in culture bottles   Culture   Final    NO GROWTH < 24 HOURS Performed at [facility], [address] [place], [place] [address]    Report Status PENDING  Incomplete  CULTURE, BLOOD (ROUTINE X 2) w Reflex to ID Panel     Status: None (Preliminary result)   Collection Time: 07/07/19  5:29 PM   Specimen: BLOOD  Result Value Ref Range Status   Specimen Description BLOOD BLOOD LEFT WRIST  Final   Special Requests   Final    BOTTLES DRAWN AEROBIC AND ANAEROBIC Blood Culture results may not be optimal due to an inadequate volume of blood received in culture bottles   Culture   Final    NO GROWTH < 24 HOURS Performed at [facility], [address], [place], [place] [address]    Report Status PENDING  Incomplete    Studies: CT HEAD WO CONTRAST  Result Date: 07/07/2019 CLINICAL DATA:  Recent cervicothoracic spinal fusion. Postoperative encephalopathy. EXAM: CT HEAD WITHOUT CONTRAST TECHNIQUE: Contiguous axial images were obtained from the base of the skull through the vertex without intravenous contrast. COMPARISON:  None. FINDINGS: Brain: The brain shows a normal appearance without evidence of malformation, atrophy, old or acute small or large vessel infarction, mass lesion, hemorrhage, hydrocephalus or extra-axial collection. Vascular: No hyperdense vessel. No evidence of atherosclerotic calcification. Skull: Normal. No traumatic finding. No focal bone lesion. Sinuses/Orbits: Sinuses are clear. Orbits appear normal. Mastoids are  clear. Other: None significant IMPRESSION: Normal head CT. Electronically Signed   By: Nelson Chimes M.D.   On: 07/07/2019 20:16   DG Chest Port 1 View  Result Date: 07/07/2019 CLINICAL DATA:  Shortness of breath, hypertension EXAM: PORTABLE CHEST 1 VIEW COMPARISON:  None. FINDINGS: Heart size is within normal limits. Low lung volumes. Bilateral perihilar and left basilar linear atelectasis. Additional somewhat streaky bibasilar opacities. No pleural effusion or pneumothorax. Fixation hardware noted at the lower thoracic spine and cervicothoracic junction. IMPRESSION: Low lung volumes with bilateral perihilar and left basilar linear atelectasis. Additional streaky bibasilar opacities which may reflect atelectasis  versus developing infiltrate. Electronically Signed   By: Davina Poke D.O.   On: 07/07/2019 11:24     Scheduled Meds: . chlorhexidine  15 mL Mouth Rinse BID  . Chlorhexidine Gluconate Cloth  6 each Topical Daily  . dicyclomine  20 mg Oral BID  . lidocaine  2 patch Transdermal Q24H  . mouth rinse  15 mL Mouth Rinse q12n4p  . morphine  30 mg Oral Q12H  . pregabalin  50 mg Oral BID  . senna  1 tablet Oral BID  . sodium chloride flush  3 mL Intravenous Q12H  . sucralfate  1 g Oral BID  . traZODone  100 mg Oral QHS   Continuous Infusions: . sodium chloride Stopped (07/06/19 0218)  . acetaminophen Stopped (07/08/19 0334)  . piperacillin-tazobactam (ZOSYN)  IV 3.375 g (07/08/19 0533)   PRN Meds: acetaminophen **OR** acetaminophen, albuterol, bisacodyl, dextrose, hydrALAZINE, magnesium citrate, menthol-cetylpyridinium **OR** phenol, ondansetron **OR** ondansetron (ZOFRAN) IV, polyethylene glycol, sodium chloride flush  Time spent: 35 minutes  Author: Berle Mull, MD [facility] 07/08/2019 8:29 AM  To reach On-call, see care teams to locate the attending and reach out to them via [email or web address]. If 7PM-7AM, please contact night-coverage If you still have difficulty  reaching the attending provider, please page the [facility] (Director on Call) for [facility] on amion for assistance.

## 2019-07-09 ENCOUNTER — Inpatient Hospital Stay: Payer: Medicaid Other

## 2019-07-09 LAB — BASIC METABOLIC PANEL
Anion gap: 8 (ref 5–15)
BUN: 11 mg/dL (ref 6–20)
CO2: 28 mmol/L (ref 22–32)
Calcium: 8.1 mg/dL — ABNORMAL LOW (ref 8.9–10.3)
Chloride: 105 mmol/L (ref 98–111)
Creatinine, Ser: 1.07 mg/dL — ABNORMAL HIGH (ref 0.44–1.00)
GFR calc Af Amer: >60 mL/min (ref >60)
GFR calc non Af Amer: 57 mL/min — ABNORMAL LOW (ref >60)
Glucose, Bld: 104 mg/dL — ABNORMAL HIGH (ref 70–99)
Potassium: 3 mmol/L — ABNORMAL LOW (ref 3.5–5.1)
Sodium: 141 mmol/L (ref 135–145)

## 2019-07-09 LAB — PROCALCITONIN: Procalcitonin: <0.1 ng/mL

## 2019-07-09 MED ORDER — METOCLOPRAMIDE HCL 5 MG/ML IJ SOLN: 10.0000 mg | Freq: Once | INTRAMUSCULAR | Status: DC

## 2019-07-09 MED ORDER — MORPHINE SULFATE ER 30 MG PO TBCR
60.0000 mg | EXTENDED_RELEASE_TABLET | Freq: Two times a day (BID) | ORAL | Status: DC
  Administered 2019-07-09 – 2019-07-10 (×2): 60 mg via ORAL
  Filled 2019-07-09 (×2): qty 2

## 2019-07-09 MED ORDER — ESCITALOPRAM OXALATE 10 MG PO TABS
20.0000 mg | ORAL_TABLET | Freq: Every day | ORAL | Status: DC
  Administered 2019-07-09 – 2019-07-10 (×2): 20 mg via ORAL
  Filled 2019-07-09 (×2): qty 2

## 2019-07-09 MED ORDER — BUPROPION HCL ER (SR) 150 MG PO TB12
150.0000 mg | ORAL_TABLET | Freq: Two times a day (BID) | ORAL | Status: DC
  Administered 2019-07-09 – 2019-07-10 (×2): 150 mg via ORAL
  Filled 2019-07-09 (×3): qty 1

## 2019-07-09 NOTE — Progress Notes (Signed)
[facility] Consultation Progress Note  Patient: Lindsay Austin [id number]   PCP: [name], [place] DOB: 1960/10/16   DOA: 07/05/2019   DOS: 07/09/2019   Date of Service: the patient was seen and examined on 07/09/2019 Primary service: Deetta Perla, MD    Brief hospital course: Pt. with PMH of HTN, HLD, GERD, depression, anxiety, chronic pain syndrome, fibromyalgia, trigeminal neuralgia, IBS, RSD; admitted on 07/05/2019, with complaint of back pain and underwent C5-T2 posterior cervical decompression and fusion on 07/05/2019, was found to have acute toxic encephalopathy and acute hypoxic and hypercarbic respiratory failure postoperatively on 07/07/2019 likely secondary to medications. Currently further plan is gradually uptitrate medication and rule out pneumonia as well as further cardiac work-up.  Subjective: Reports pain well controlled.  No nausea no vomiting in the time of my evaluation but reports nausea when OT was seeing the patient in the afternoon.  Having bowel movements.  No fever no chills.  Assessment and Plan: 1.  Acute metabolic encephalopathy from polypharmacy Patient's medications were increased recently secondary to her uncontrolled pain. ABG showed hypercarbia. Currently on room air. Gradually resume home pain medication at home dose. Patient is on Lexapro Wellbutrin Lyrica and Robaxin which is also currently on hold. We will resume Lyrica 50 mg twice daily dose. Continue incentive spirometry. Can go to telemetry.  2.  Concern for aspiration pneumonia. Chest x-ray does show evidence of possible infiltrate. Likely atelectasis. Procalcitonin negative. Continue antibiotics for total of 5 days.  3.  Elevated troponin.  Likely from demand ischemia. No chest pain no shortness of breath no angina. Check EKG. Troponins are stable. If possible add aspirin 81 mg. Check echocardiogram. No indication for inpatient cardiac work-up or cardiology consult for now given lack of  symptoms and stability of the troponins.  4.  Intractable nausea and vomiting. IBS. Patient is on scheduled Bentyl which I will continue for now. As needed Zofran. Check x-ray abdomen.  5.  Chronic pain syndrome. Patient is on MS Contin 60 mg twice daily at home plus Lyrica 50 mg 3 times daily. Patient is also on Robaxin. Currently holding Robaxin. Increasing MS Contin to home dose 60 mg twice daily. Continue Lyrica 50 mg twice daily only.  Recommendation on discharge: Outpatient follow-up with PCP Gradual up titration of pain medication with neurosurgery and PCP  Diet: Cardiac diet DVT Prophylaxis: SCD, pharmacological prophylaxis Per neurosurgery  Family Communication: no family was present at bedside, at the time of interview.    Disposition: We will continue to follow the patient.   Other Consultants: none Procedures: Echocardiogram ordered  Antibiotics: Anti-infectives (From admission, onward)   Start     Dose/Rate Route Frequency Ordered Stop   07/08/19 1330  amoxicillin-clavulanate (AUGMENTIN) 875-125 MG per tablet 1 tablet     1 tablet Oral Every 12 hours 07/08/19 1325 07/12/19 0959   07/07/19 1645  piperacillin-tazobactam (ZOSYN) IVPB 3.375 g  Status:  Discontinued     3.375 g 12.5 mL/hr over 240 Minutes Intravenous Every 8 hours 07/07/19 1635 07/08/19 1325   07/05/19 1343  vancomycin (VANCOCIN) powder  Status:  Discontinued       As needed 07/05/19 1343 07/05/19 1445   07/05/19 0830  ceFAZolin (ANCEF) IVPB 2g/100 mL premix     2 g 200 mL/hr over 30 Minutes Intravenous  Once 07/05/19 0823 07/05/19 1058      Objective: Physical Exam: Vitals:   07/09/19 0800 07/09/19 0834 07/09/19 0916 07/09/19 1544  BP: 134/67  137/71 139/76  Pulse:  62  60 62  Resp: 13  16 17   Temp:  98.5 F (36.9 C) 98.9 F (37.2 C) 99.1 F (37.3 C)  TempSrc:  Oral Oral Oral  SpO2: 94%  97% 98%    Intake/Output Summary (Last 24 hours) at 07/09/2019 1905 Last data filed at 07/09/2019  1711 Gross per 24 hour  Intake --  Output 905 ml  Net -905 ml   There were no vitals filed for this visit. General: alert and oriented to time, place, and person. Appear in mild distress, affect appropriate Eyes: PERRL, Conjunctiva normal ENT: Oral Mucosa Clear, moist  Neck: no JVD, no Abnormal Mass Or lumps Cardiovascular: S1 and S2 Present, no Murmur, peripheral pulses symmetrical Respiratory: normal respiratory effort, Bilateral Air entry equal and Decreased, no use of accessory muscle, Clear to Auscultation, no Crackles, no wheezes Abdomen: Bowel Sound present, Soft and no tenderness, no hernia Skin: no rashes  Extremities: no Pedal edema, no calf tenderness Neurologic: without any new focal findings Gait not checked due to patient safety concerns  Data Reviewed: CBC: Recent Labs  Lab 07/07/19 1104 07/08/19 0726  WBC 13.7* 7.6  NEUTROABS  --  5.8  HGB 11.3* 10.2*  HCT 33.7* 30.7*  MCV 92.1 93.6  PLT 119* [id number]*   Basic Metabolic Panel: Recent Labs  Lab 07/07/19 1104 07/07/19 1731 07/08/19 0726 07/09/19 0655  NA 142  --  143 141  K 5.7* 3.8 3.1* 3.0*  CL 109  --  105 105  CO2 24  --  32 28  GLUCOSE 113*  --  89 104*  BUN 11  --  10 11  CREATININE 0.96  --  0.85 1.07*  CALCIUM 8.6*  --  8.0* 8.1*  MG  --   --  1.9  --    Liver Function Tests: Recent Labs  Lab 07/08/19 0726  AST 18  ALT 14  ALKPHOS 40  BILITOT 0.8  PROT 5.7*  ALBUMIN 3.1*   No results for input(s): LIPASE, AMYLASE in the last 168 hours. No results for input(s): AMMONIA in the last 168 hours. Cardiac Enzymes: Recent Labs  Lab 07/07/19 1934  CKTOTAL 196  CKMB 5.1*   BNP (last 3 results) Recent Labs    07/07/19 1254  BNP 244.0*   CBG: Recent Labs  Lab 07/07/19 1242 07/07/19 1538 07/07/19 1649 07/07/19 2018 07/08/19 0738  GLUCAP 90 68* 126* 91 73   Recent Results (from the past 240 hour(s))  Surgical pcr screen     Status: Abnormal   Collection Time: 07/01/19 11:00 AM    Specimen: Nasal Mucosa; Nasal Swab  Result Value Ref Range Status   MRSA, PCR NEGATIVE NEGATIVE Final   Staphylococcus aureus POSITIVE (A) NEGATIVE Final    Comment: (NOTE) The Xpert SA Assay (FDA approved for NASAL specimens in patients 4 years of age and older), is one component of a comprehensive surveillance program. It is not intended to diagnose infection nor to guide or monitor treatment. Performed at [facility], [place], [place] [address]   SARS CORONAVIRUS 2 (TAT 6-24 HRS)     Status: None   Collection Time: 07/01/19 12:20 PM  Result Value Ref Range Status   SARS Coronavirus 2 NEGATIVE NEGATIVE Final    Comment: (NOTE) SARS-CoV-2 target nucleic acids are NOT DETECTED. The SARS-CoV-2 RNA is generally detectable in upper and lower respiratory specimens during the acute phase of infection. Negative results do not preclude SARS-CoV-2 infection, do not rule  out co-infections with other pathogens, and should not be used as the sole basis for treatment or other patient management decisions. Negative results must be combined with clinical observations, patient history, and epidemiological information. The expected result is Negative. Fact Sheet for Patients: SugarRoll.be Fact Sheet for Healthcare Providers: https://www.woods-mathews.com/ This test is not yet approved or cleared by the [place] FDA and  has been authorized for detection and/or diagnosis of SARS-CoV-2 by FDA under an Emergency Use Authorization (EUA). This EUA will remain  in effect (meaning this test can be used) for the duration of the COVID-19 declaration under Section 56 4(b)(1) of the Act, 21 U.S.C. section 360bbb-3(b)(1), unless the authorization is terminated or revoked sooner. Performed at [facility], [place] [address] [place], [place] [address]   MRSA PCR Screening     Status: None   Collection Time: 07/07/19  4:20 AM    Specimen: Nasal Mucosa; Nasopharyngeal  Result Value Ref Range Status   MRSA by PCR NEGATIVE NEGATIVE Final    Comment:        The GeneXpert MRSA Assay (FDA approved for NASAL specimens only), is one component of a comprehensive MRSA colonization surveillance program. It is not intended to diagnose MRSA infection nor to guide or monitor treatment for MRSA infections. Performed at [facility], [address] [place], [place] [address]   CULTURE, BLOOD (ROUTINE X 2) w Reflex to ID Panel     Status: None (Preliminary result)   Collection Time: 07/07/19  5:29 PM   Specimen: BLOOD  Result Value Ref Range Status   Specimen Description BLOOD BLOOD LEFT HAND  Final   Special Requests   Final    BOTTLES DRAWN AEROBIC AND ANAEROBIC Blood Culture results may not be optimal due to an inadequate volume of blood received in culture bottles   Culture   Final    NO GROWTH 2 DAYS Performed at [facility], [address], [place], [place] [address]    Report Status PENDING  Incomplete  CULTURE, BLOOD (ROUTINE X 2) w Reflex to ID Panel     Status: None (Preliminary result)   Collection Time: 07/07/19  5:29 PM   Specimen: BLOOD  Result Value Ref Range Status   Specimen Description BLOOD BLOOD LEFT WRIST  Final   Special Requests   Final    BOTTLES DRAWN AEROBIC AND ANAEROBIC Blood Culture results may not be optimal due to an inadequate volume of blood received in culture bottles   Culture   Final    NO GROWTH 2 DAYS Performed at [facility], [address] [place], [place] [address]    Report Status PENDING  Incomplete    Studies: No results found.   Scheduled Meds: . acetaminophen  650 mg Oral Q4H   Or  . acetaminophen  650 mg Rectal Q4H  . amoxicillin-clavulanate  1 tablet Oral Q12H  . chlorhexidine  15 mL Mouth Rinse BID  . Chlorhexidine Gluconate Cloth  6 each Topical Daily  . dicyclomine  20 mg Oral BID  . enoxaparin (LOVENOX) injection  40 mg  Subcutaneous Q24H  . lidocaine  2 patch Transdermal Q24H  . mouth rinse  15 mL Mouth Rinse q12n4p  . morphine  60 mg Oral Q12H  . pregabalin  50 mg Oral BID  . senna  1 tablet Oral BID  . sodium chloride flush  3 mL Intravenous Q12H  . sucralfate  1 g Oral BID  . traZODone  100 mg Oral QHS   Continuous Infusions: . sodium chloride Stopped (07/06/19 0218)   PRN Meds: albuterol, bisacodyl, dextrose, hydrALAZINE, magnesium citrate, menthol-cetylpyridinium **OR** phenol, ondansetron **OR** ondansetron (ZOFRAN) IV, polyethylene glycol, sodium chloride flush, traMADol  Time spent: 35 minutes  Author: Berle Mull, MD [facility] 07/09/2019 7:05 PM  To reach On-call, see care teams to locate the attending and reach out to them via [email or web address]. If 7PM-7AM, please contact night-coverage If you still have difficulty reaching the attending provider, please page the [facility] (Director on Call) for [facility] on amion for assistance.

## 2019-07-09 NOTE — Progress Notes (Signed)
Order received to discontinue foley catheter for a trial of voiding from [place], [place]

## 2019-07-09 NOTE — Progress Notes (Signed)
OT Cancellation Note  Patient Details Name: Lindsay Austin MRN: [id number] DOB: 09-25-1960   Cancelled Treatment:    Reason Eval/Treat Not Completed: Fatigue/lethargy limiting ability to participate.  Attempted to see pt for OT treatment this afternoon, but pt feeling nauseous and unwilling to participate.  Provided pt with basin and notified RN.  Will follow up at next opportunity.  Myrtie Hawk Garet Hooton, OTR/L 07/09/19, 3:26 PM

## 2019-07-09 NOTE — Progress Notes (Signed)
Physical Therapy Treatment Patient Details Name: Lindsay Austin MRN: [id number] DOB: Jan 27, 1961 Today's Date: 07/09/2019    History of Present Illness 59 y/o F with PMH: cervical stenosis at C6/7 after prior C4-7 ACDF. She has had incomplete fusion at C6/7 and symptoms of numbness and weakness in hands. Pt now s/p arthrodesis, posterior Cervical C5 - T2, lateral mass screws at C5/C6, pedicle screw fixation at T1, T2 and C6, C7 laminectomy on 07/05/2019. Hospital course complicated by transfer to CCU due to acute hypoxic/hypercarbic respiratory failure, acute toxic encephalopathy due to polypharmacy; cleared to resume therapy services.    PT Comments    Pt recently medicated for pain needing to use bathroom.  Min a with bed mobility. Ambulated to bathroom with independent self care.  She is able to walk 40' in hallway but overall limited by occasional shooting pains.  Returned to supine after gait.  Will need stair training in AM.   Follow Up Recommendations  Home health PT;Supervision for mobility/OOB     Equipment Recommendations  Rolling walker with 5" wheels;3in1 (PT)    Recommendations for Other Services       Precautions / Restrictions Precautions Precautions: Cervical;Fall Required Braces or Orthoses: Cervical Brace Restrictions Weight Bearing Restrictions: No    Mobility  Bed Mobility Overal bed mobility: Needs Assistance Bed Mobility: Supine to Sit;Sit to Supine     Supine to sit: Min assist Sit to supine: Min guard      Transfers Overall transfer level: Needs assistance Equipment used: Rolling walker (2 wheeled) Transfers: Sit to/from Stand Sit to Stand: Min assist            Ambulation/Gait Ambulation/Gait assistance: Min Web designer (Feet): 40 Feet Assistive device: Rolling walker (2 wheeled) Gait Pattern/deviations: Step-through pattern;Decreased step length - right;Decreased step length - left Gait velocity: decreased   General Gait  Details: no buckling iwth increased confidence.  Stopps occasionally due to shooting pain but overall does well.   Stairs             Wheelchair Mobility    Modified Rankin (Stroke Patients Only)       Balance Overall balance assessment: Needs assistance Sitting-balance support: No upper extremity supported;Feet supported Sitting balance-Leahy Scale: Good     Standing balance support: Bilateral upper extremity supported Standing balance-Leahy Scale: Fair Standing balance comment: needed walker for stability,                            Cognition Arousal/Alertness: Awake/alert Behavior During Therapy: WFL for tasks assessed/performed Overall Cognitive Status: Within Functional Limits for tasks assessed                                        Exercises Other Exercises Other Exercises: to bathroom for medium BM    General Comments        Pertinent Vitals/Pain Pain Assessment: Faces Faces Pain Scale: Hurts even more Pain Location: cervical, L shoulder area Pain Descriptors / Indicators: Aching;Shooting;Guarding;Grimacing Pain Intervention(s): Premedicated before session;Monitored during session;Limited activity within patient's tolerance    Home Living                      Prior Function            PT Goals (current goals can now be found in the care plan section) Progress towards  PT goals: Progressing toward goals    Frequency    BID      PT Plan Current plan remains appropriate    Co-evaluation              AM-PAC PT "6 Clicks" Mobility   Outcome Measure  Help needed turning from your back to your side while in a flat bed without using bedrails?: A Little Help needed moving from lying on your back to sitting on the side of a flat bed without using bedrails?: A Little Help needed moving to and from a bed to a chair (including a wheelchair)?: A Little Help needed standing up from a chair using your arms  (e.g., wheelchair or bedside chair)?: A Little Help needed to walk in hospital room?: A Little Help needed climbing 3-5 steps with a railing? : A Little 6 Click Score: 18    End of Session Equipment Utilized During Treatment: Gait belt Activity Tolerance: Patient limited by pain Patient left: in bed;with call bell/phone within reach;with bed alarm set;with family/visitor present Nurse Communication: Mobility status       Time: [id number] PT Time Calculation (min) (ACUTE ONLY): 15 min  Charges:  $Gait Training: 8-22 mins                    Chesley Noon, PTA 07/09/19, 12:37 PM

## 2019-07-09 NOTE — Progress Notes (Signed)
Physical Therapy Treatment Patient Details Name: Lindsay Austin MRN: [id number] DOB: 30-Nov-1960 Today's Date: 07/09/2019    History of Present Illness 59 y/o F with PMH: cervical stenosis at C6/7 after prior C4-7 ACDF. She has had incomplete fusion at C6/7 and symptoms of numbness and weakness in hands. Pt now s/p arthrodesis, posterior Cervical C5 - T2, lateral mass screws at C5/C6, pedicle screw fixation at T1, T2 and C6, C7 laminectomy on 07/05/2019. Hospital course complicated by transfer to CCU due to acute hypoxic/hypercarbic respiratory failure, acute toxic encephalopathy due to polypharmacy; cleared to resume therapy services.    PT Comments    Gradual improvement in functional performance and overall activity tolerance; participating with increased activity, requiring less physical assist this date.  Remains generally limited by pan, but eager to participate/progress as able.    Follow Up Recommendations  Home health PT;Supervision for mobility/OOB     Equipment Recommendations  Rolling walker with 5" wheels;3in1 (PT)    Recommendations for Other Services       Precautions / Restrictions Precautions Precautions: Cervical;Fall Required Braces or Orthoses: Cervical Brace Restrictions Weight Bearing Restrictions: No    Mobility  Bed Mobility Overal bed mobility: Needs Assistance Bed Mobility: Supine to Sit     Supine to sit: Min assist Sit to supine: Min guard   General bed mobility comments: able to complete with less physical assist this date  Transfers Overall transfer level: Needs assistance Equipment used: Rolling walker (2 wheeled) Transfers: Sit to/from Stand Sit to Stand: Min assist         General transfer comment: cuing for hand placement  Ambulation/Gait Ambulation/Gait assistance: Min assist Gait Distance (Feet): 50 Feet Assistive device: Rolling walker (2 wheeled) Gait Pattern/deviations: Step-through pattern;Decreased step length -  right;Decreased step length - left Gait velocity: decreased   General Gait Details: slow and guarded gait performance; decreased step height/length, but no overt buckling or LOB.  Distance limited by pain and generalized fatigue   Stairs             Wheelchair Mobility    Modified Rankin (Stroke Patients Only)       Balance Overall balance assessment: Needs assistance Sitting-balance support: No upper extremity supported;Feet supported Sitting balance-Leahy Scale: Good     Standing balance support: Bilateral upper extremity supported Standing balance-Leahy Scale: Fair Standing balance comment: needed walker for stability,                            Cognition Arousal/Alertness: Awake/alert Behavior During Therapy: WFL for tasks assessed/performed Overall Cognitive Status: Within Functional Limits for tasks assessed                                        Exercises Other Exercises Other Exercises: Bed/BSC with RW, min assist for lift off/balance; sit/stand from [facility] with RW, cga/min assist.    General Comments        Pertinent Vitals/Pain Pain Assessment: Faces Faces Pain Scale: Hurts even more Pain Location: cervical, L shoulder area Pain Descriptors / Indicators: Aching;Shooting;Guarding;Grimacing Pain Intervention(s): Limited activity within patient's tolerance;Monitored during session;Premedicated before session;Repositioned    Home Living                      Prior Function            PT Goals (current goals  can now be found in the care plan section) Acute Rehab PT Goals Patient Stated Goal: to improve pain PT Goal Formulation: With patient Time For Goal Achievement: 07/22/19 Potential to Achieve Goals: Good Progress towards PT goals: Progressing toward goals    Frequency    BID      PT Plan Current plan remains appropriate    Co-evaluation              AM-PAC PT "6 Clicks" Mobility   Outcome  Measure  Help needed turning from your back to your side while in a flat bed without using bedrails?: A Little Help needed moving from lying on your back to sitting on the side of a flat bed without using bedrails?: A Little Help needed moving to and from a bed to a chair (including a wheelchair)?: A Little Help needed standing up from a chair using your arms (e.g., wheelchair or bedside chair)?: A Little Help needed to walk in hospital room?: A Little Help needed climbing 3-5 steps with a railing? : A Little 6 Click Score: 18    End of Session Equipment Utilized During Treatment: Gait belt Activity Tolerance: Patient limited by pain Patient left: in chair;with call bell/phone within reach;with chair alarm set Nurse Communication: Mobility status PT Visit Diagnosis: Muscle weakness (generalized) (M62.81);Difficulty in walking, not elsewhere classified (R26.2);Other abnormalities of gait and mobility (R26.89);Pain;Other symptoms and signs involving the nervous system (R29.898)     Time:  0940- 1008 PT Time Calculation (min) (ACUTE ONLY): 28 min  Charges:  $Gait Training: 8-22 mins                    Aran Menning H. Owens Shark, PT, DPT, NCS 07/09/19, 3:20 PM [phone]

## 2019-07-09 NOTE — TOC Progression Note (Signed)
Transition of Care [facility]) - Progression Note    Patient Details  Name: Pegeen Stiger MRN: [id number] Date of Birth: 1961/01/23  Transition of [facility]) CM/SW Contact  Greyson Riccardi, Gardiner Rhyme, LCSW Phone Number: 07/09/2019, 9:35 AM  Clinical Narrative:  Met with pt to discuss discharge needs. She does need a rw and 3 in1 , have ordered via [employer]. Pt to go to OPPT thru MD office. She knows to call when daughter is available to take her to them. Daughter to come to stay with pt at discharge to assist her. Continue to work on discharge needs.    Expected Discharge Plan: OP Rehab Barriers to Discharge: Continued Medical Work up  Expected Discharge Plan and Services Expected Discharge Plan: OP Rehab   Discharge Planning Services: CM Consult                     DME Arranged: Gilford Rile rolling DME Agency: [employer] Date DME Agency Contacted: 07/06/19 Time DME Agency Contacted: [phone] Representative spoke with at DME Agency: Leroy Sea HH Arranged: NA           Social Determinants of Health ([place]) Interventions    Readmission Risk Interventions No flowsheet data found.

## 2019-07-09 NOTE — Progress Notes (Signed)
Procedure: C5-T2 posterior decompression and fusion  Procedure date: 07/05/2019 Diagnosis: cervical myelopathy, arthrodesis   History: Lindsay Austin is s/p C5-T2 posterior cervical decompression and fusion  POD4: She is doing well. Pain remains at 7/10 in posterior cervical spine. Drain output overnight 55. She was able to ambulate with PT. Tolerating PO meds and food.    POD3: She is recovering well. Tolerated room air this AM and through the day without issue. Still complains of 7/10 posterior neck pain.Received PT today and spent time in the hospital chair. Foley still present - states she is incontinent at baseline. Denies chest pain, shortness of breath, new upper or lower extremity pain/numbness/tingling, weakness.   POD2: Transferred to ICU overnight due to declining O2 levels despite receiving oxygen via nasal cannula. Bipap in place. Unable to rate pain specifically but states that it is "high". Denies any new upper or lower extremity pain/numbness/tingling.   POD1: Pain 7-8/10. Hand numbness and tingling have improved. Complains of sharp pain in left upper arm.  Foley removed - she have not voided yet.   POD0: Tolerated procedure well. Evaluated in post op recovery still disoriented from anesthesia but able to answer questions and obey commands. Complains of pain in posterior cervical spine. Unable to determine if any improvement in upper extremities. Denies new pain/numbness/tingling in upper or lower extremities.   Physical Exam: Vitals:   07/09/19 0834 07/09/19 0916  BP:  137/71  Pulse:  60  Resp:  16  Temp: 98.5 F (36.9 C) 98.9 F (37.2 C)  SpO2:  97%    General: Awake, Alert and oriented, sitting in hospital chair -  appears comfortable.   Strength:5/5 throughout lower extremities. 5/5 grip, bicep and tricep bilaterally. At least 4/5 deltoid - pain limitations.   Sensation: intact and symmetric throughout upper and lower extremities.   Skin: Incision redressed and  drain removed. No active bleeding or discharge. Sutures are intact.   Data:  Recent Labs  Lab 07/07/19 1104 07/07/19 1731 07/08/19 0726 07/08/19 0726 07/09/19 0655  NA 142  --  143   < > 141  K 5.7*   < > 3.1*   < > 3.0*  CL 109  --  105   < > 105  CO2 24  --  32   < > 28  BUN 11  --  10   < > 11  CREATININE 0.96  --  0.85   < > 1.07*  GLUCOSE 113*  --  89   < > 104*  CALCIUM 8.6*  --  8.0*  --  8.1*   < > = values in this interval not displayed.   Recent Labs  Lab 07/08/19 0726  AST 18  ALT 14  ALKPHOS 40     Recent Labs  Lab 07/07/19 1104 07/07/19 1104 07/08/19 0726  WBC 13.7*   < > 7.6  HGB 11.3*   < > 10.2*  HCT 33.7*   < > 30.7*  PLT 119*  --  135*   < > = values in this interval not displayed.   No results for input(s): APTT, INR in the last 168 hours.       Other tests/results:   EXAM: CT CERVICAL SPINE WITHOUT CONTRAST 07/06/2019  FINDINGS: Alignment: Normal  Skull base and vertebrae: Negative for fracture. ACDF C4 through C7. Interval posterior fusion C4 through T1 with laminectomy at C6 and C7.  Soft tissues and spinal canal: Posterior drain is present in the left epidural  space at C6-7. This could also be an infusion catheter.  No soft tissue hematoma or mass. Few gas bubbles in the posterior soft tissues due to recent surgery.  Disc levels:  C2-3: Mild disc degeneration.  Negative for stenosis  C3-4: Disc degeneration and spondylosis with diffuse uncinate spurring causing moderate foraminal stenosis bilaterally. Mild spinal canal narrowing.  C4-5: ACDF. No solid bony union is present. Diffuse uncinate spurring causing moderate to severe foraminal encroachment bilaterally. Mild spinal stenosis.  C5-6: ACDF. Persistent lucency through the disc space without bony union. Diffuse uncinate spurring causing mild to moderate foraminal stenosis bilaterally. Posterior decompression. Posterior fusion hardware in good  position  C6-7: ACDF. Persistent lucency through the disc space compatible with pseudarthrosis. Mild to moderate foraminal stenosis bilaterally due to uncinate spurring. Posterior laminectomy. Posterior hardware fusion in good position.  C7-T1: Negative for spinal or foraminal stenosis. Bilateral pedicle screw fusion in good position.  T1-2: Bilateral pedicle screws in good position. Negative for stenosis.  Upper chest: Lung apices clear bilaterally.  Other: None  IMPRESSION: Interval posterior screw fusion from C5 through T1. Hardware in good position. Decompressive laminectomy at C5-6 and C6-7  ACDF at C4-5, C5-6, C6-7. No bony fusion is documented at these 3 fusion levels. There is significant foraminal stenosis at multiple levels in the cervical spine due to uncinate spurring.     EXAM: CERVICAL SPINE - 2-3 VIEW 07/06/2019 COMPARISON:  07/05/2019, 04/01/2019  FINDINGS: Status post prior C4-C7 ACDF. Posterior spinal fusion hardware from C5-T2. Hardware appears well seated and in good alignment. Surgical drain is present. Prevertebral soft tissues are within normal limits.  IMPRESSION: Satisfactory postoperative appearance of C4-C7 ACDF and C5-T2 posterior spinal fusion. Assessment/Plan:  Lindsay Austin is POD4 s/p C5-T2 posterior cervical decompression and fusion. Will continue to monitor  - monitor drain output - 55 overnight - d/c drain - mobilize - pain control - regimen per hospitalist recommendations. - Appreciate participation in patient care - DVT prophylaxis - lovenox - PTOT - Brace  -Imaging - post op films complete. CT imaging complete (upper extremity pain).  - D/c foley catheter. - Baseline patient is incontinent and requests pad.   Marin Olp PA-C Department of Neurosurgery

## 2019-07-10 LAB — BASIC METABOLIC PANEL
Anion gap: 8 (ref 5–15)
BUN: 11 mg/dL (ref 6–20)
CO2: 28 mmol/L (ref 22–32)
Calcium: 8.3 mg/dL — ABNORMAL LOW (ref 8.9–10.3)
Chloride: 104 mmol/L (ref 98–111)
Creatinine, Ser: 1.01 mg/dL — ABNORMAL HIGH (ref 0.44–1.00)
GFR calc Af Amer: >60 mL/min (ref >60)
GFR calc non Af Amer: >60 mL/min (ref >60)
Glucose, Bld: 87 mg/dL (ref 70–99)
Potassium: 2.9 mmol/L — ABNORMAL LOW (ref 3.5–5.1)
Sodium: 140 mmol/L (ref 135–145)

## 2019-07-10 LAB — PROCALCITONIN: Procalcitonin: <0.1 ng/mL

## 2019-07-10 MED ORDER — METHOCARBAMOL 500 MG PO TABS: 500.0000 mg | ORAL_TABLET | Freq: Three times a day (TID) | ORAL | 2 refills | Status: DC | PRN

## 2019-07-10 MED ORDER — POTASSIUM CHLORIDE CRYS ER 20 MEQ PO TBCR
40.0000 meq | EXTENDED_RELEASE_TABLET | ORAL | Status: AC
  Administered 2019-07-10 (×2): 40 meq via ORAL
  Filled 2019-07-10 (×2): qty 2

## 2019-07-10 MED ORDER — LIDOCAINE 5 % EX PTCH: 2.0000 | MEDICATED_PATCH | CUTANEOUS | 0 refills | Status: DC

## 2019-07-10 MED ORDER — TRAMADOL HCL 50 MG PO TABS: 50.0000 mg | ORAL_TABLET | Freq: Three times a day (TID) | ORAL | 0 refills | Status: DC | PRN

## 2019-07-10 NOTE — Progress Notes (Signed)
Spoke with Barbaraann Rondo in pharmacy and verified okay to give second dose 43mEq K+ prior to discharge. Patient's K+ is 2.9 this morning. Will give medication prior to discharge.

## 2019-07-10 NOTE — Progress Notes (Signed)
Physical Therapy Treatment Patient Details Name: Lindsay Austin MRN: [id number] DOB: [birth year] Today's Date: 07/10/2019    History of Present Illness 59 y/o F with PMH: cervical stenosis at C6/7 after prior C4-7 ACDF. She has had incomplete fusion at C6/7 and symptoms of numbness and weakness in hands. Pt now s/p arthrodesis, posterior Cervical C5 - T2, lateral mass screws at C5/C6, pedicle screw fixation at T1, T2 and C6, C7 laminectomy on 07/05/2019. Hospital course complicated by transfer to CCU due to acute hypoxic/hypercarbic respiratory failure, acute toxic encephalopathy due to polypharmacy; cleared to resume therapy services.    PT Comments    Pt was long sitting in bed upon arriving. She agrees to PT session but reports having increased spasm. She was agreeable to OOB activity and stair training. Pt exited L side of bed with increased time while performing "log roll" technique to adhere to spinal precautions. Cervical collar donned at EOB. She was able to stand and ambulate with CGA for safety. Performed ascending/descending 4 stair with + 1 railing with CGA. Overall pt is doing well but limited by pain. She reports she feels confident she can safely D/C home with family support. PT recommends D/C to home with HHPT to follow. Pt was repositioned back in bed post session with call bell in reach and RN aware of pt's abilities. PT will continue current POC until pt is D/C'd.    Follow Up Recommendations  Home health PT;Supervision for mobility/OOB     Equipment Recommendations  Rolling walker with 5" wheels;3in1 (PT)    Recommendations for Other Services       Precautions / Restrictions Precautions Precautions: Cervical;Fall Cervical Brace: Hard collar Restrictions Weight Bearing Restrictions: No    Mobility  Bed Mobility Overal bed mobility: Needs Assistance Bed Mobility: Supine to Sit   Sidelying to sit: Min guard Supine to sit: Min guard Sit to supine: Min assist    General bed mobility comments: CGA for all mobility to get OOB  however required min assist with returning to supine. no lifting assistance to exit bed but min with lifting BLEs into bed. she was able to perform log roll technique with exit/entering  Transfers Overall transfer level: Modified independent Equipment used: Rolling walker (2 wheeled) Transfers: Sit to/from Stand Sit to Stand: Min guard         General transfer comment: With increased time, pt was able to stand/sit without lifting assistance. CGA for safety only 2/2 to pt reports she has history of knee buckling.   Ambulation/Gait Ambulation/Gait assistance: Min guard Gait Distance (Feet): 120 Feet Assistive device: Rolling walker (2 wheeled) Gait Pattern/deviations: Step-through pattern;Antalgic Gait velocity: decreased   General Gait Details: Pt demonstarted safe gait kinematics however slow 2/2 to spasms. She demonstrated no LOB or unsteadiness throughout. CGA for safety only   Stairs Stairs: Yes Stairs assistance: Min guard Stair Management: One rail Left Number of Stairs: 4 General stair comments: Pt was able to demonstrate safe ability to ascend/descend 4 stair with CGA for safety. No LOB. increased time to perform 2/2 to pain. Pt reports feeling confident she can perform getting in/out of home environment without difficulty.   Wheelchair Mobility    Modified Rankin (Stroke Patients Only)       Balance  Cognition Arousal/Alertness: Awake/alert Behavior During Therapy: WFL for tasks assessed/performed Overall Cognitive Status: Within Functional Limits for tasks assessed                                 General Comments: Pt was A and O x 4 and cooperative even with 7/10 pain. agreeable to OOB activity      Exercises      General Comments        Pertinent Vitals/Pain Pain Assessment: 0-10 Pain Score: 7  Faces Pain  Scale: Hurts even more Pain Location: cervical, R shoulder area Pain Descriptors / Indicators: Aching;Shooting;Guarding;Grimacing Pain Intervention(s): Limited activity within patient's tolerance;Monitored during session;Premedicated before session    Home Living                      Prior Function            PT Goals (current goals can now be found in the care plan section) Acute Rehab PT Goals Patient Stated Goal: To go home so I can catch up on rest" Progress towards PT goals: Progressing toward goals    Frequency    BID      PT Plan Current plan remains appropriate    Co-evaluation              AM-PAC PT "6 Clicks" Mobility   Outcome Measure  Help needed turning from your back to your side while in a flat bed without using bedrails?: A Little Help needed moving from lying on your back to sitting on the side of a flat bed without using bedrails?: A Little Help needed moving to and from a bed to a chair (including a wheelchair)?: A Little Help needed standing up from a chair using your arms (e.g., wheelchair or bedside chair)?: A Little Help needed to walk in hospital room?: A Little Help needed climbing 3-5 steps with a railing? : A Little 6 Click Score: 18    End of Session Equipment Utilized During Treatment: Gait belt Activity Tolerance: Patient limited by pain Patient left: in bed;with call bell/phone within reach;with bed alarm set;with nursing/sitter in room Nurse Communication: Mobility status PT Visit Diagnosis: Muscle weakness (generalized) (M62.81);Difficulty in walking, not elsewhere classified (R26.2);Other abnormalities of gait and mobility (R26.89);Pain;Other symptoms and signs involving the nervous system (R29.898) Pain - Right/Left: Right Pain - part of body: Shoulder     Time: [id number] PT Time Calculation (min) (ACUTE ONLY): 18 min  Charges:  $Gait Training: 8-22 mins                     Julaine Fusi PTA 07/10/19, 12:42  PM

## 2019-07-10 NOTE — Discharge Instructions (Signed)
NEUROSURGERY DISCHARGE INSTRUCTIONS  Admission diagnosis: S/P cervical spinal fusion [Z98.1]  Operative procedure: C5-T2 Posterior Fusion, C6/7 Laminectomy  What to do after you leave the hospital:  Recommended diet: regular diet. Increase protein intake to promote wound healing.  Recommended activity: no lifting, driving, or strenuous exercise for 4 weeks.   Wear your cervical collar ata ll times when up moving around. Take caution to avoid falls. Continue your prescribed long acting pain medication and take Methocarbamol and Tramadol only as needed. You may take Tylenol. Continue stool softener while on pain medications.   Special Instructions  No straining, no heavy lifting > 10lbs x 2 weeks.  Keep incision area clean and dry. Clean incision with 50% Hydrogen peroxide and 50% water solution once a day.  May Shower starting today.  No bath or soaking in tub for2 weeks.  Sutures to be removed in 14 days.   Please Report any of the following: Nausea or Vomiting, Temperature is greater than 101.80F (38.1C) degrees, Dizziness, Abdominal Pain, Difficulty Breathing or Shortness of Breath, Inability to Eat, drink Fluids, or Take medications, Bleeding, swelling, or drainage from surgical incision sites, New numbness or weakness, Seizures, Altered mental status and Bowel or bladder dysfunction by contacting our office [phone]  Additional Follow up appointments You have an appointment with [place] in clinic to remove sutures on 3/23   Please see below for scheduled appointments:  Future Appointments  Date Time Provider [place]  07/21/2019 11:40 AM [facility] [place]  08/31/2019  8:00 AM [name], MD [facility] None  05/18/2020  2:30 PM Billey Co, MD [facility] None

## 2019-07-10 NOTE — Discharge Summary (Signed)
Physician Discharge Summary  Patient ID: Lindsay Austin MRN: [id number] DOB/AGE: Mar 11, 1961 59 y.o.  Admit date: 07/05/2019 Discharge date: 07/10/2019  Admission Diagnoses: Cervical Myelopathy  Discharge Diagnoses:  Principal Problem:   Acute respiratory failure with hypoxia ([place]) Active Problems:   Chronic pain syndrome   Major depressive disorder with current active episode   S/P cervical spinal fusion   HTN (hypertension)   Acute metabolic encephalopathy   Hypoglycemia   Gastric ulcer   Discharged Condition: good  Hospital Course: Lindsay Austin was taken to surgery on 3/8 for the posterior cervical decompression and fusion. The surgery went as planned and she was taken to the ward postoperatively for care. There, she was seen to be at neurological baseline and working on pain control. On POD#1, she was able to stand with PT and had continued neck pain and medication was titrated up. She was tolerating a diet. She had difficulty voiding after catheter removal and in/out catheter performed. That evening, she was found to be somnolent with decreased oxygen levels and was transported to ICU where pain medication was reversed and she slowly returned to her baseline but requiring oxygen. Evaluation of cardiac and pulmonary causes were unrevealing and her pain medication was likely etiology. On POD#2 and 3 she continued to improve and pain medications were gradually re-started. CT and xray of the cervical spine had been obtained. Her hemovac drain was removed. She worked with PT and had better pain control. On POD#5, she was seen to be at neurologic baseline. She was able to void urine. She was tolerating a diet. We discussed discharge home and she was safely discharged on 3/13.  Consults: Hospitalist   Discharge Exam: Blood pressure (!) 143/77, pulse (!) 54, temperature 98.8 F (37.1 C), temperature source Oral, resp. rate 18, height 5' 4.02" (1.626 m), weight 83 kg, SpO2 96 %. Dressing on neck  clean and dry Awake, alert 5/5 strength in all extremities Sensation intact in all extremities with some paresthesias intermittent in hands   Disposition: Home   Allergies as of 07/10/2019      Reactions   Methadone Palpitations   Other Swelling, Other (See Comments)   Tricyclic antidepressants Unable to void Tongue Swelling    Tape Other (See Comments)   Blisters  Paper tape ok Tegaderm ok       Medication List    TAKE these medications   acetaminophen 500 MG tablet Commonly known as: TYLENOL Take 2 tablets (1,000 mg total) by mouth 2 (two) times daily as needed.   albuterol 108 (90 Base) MCG/ACT inhaler Commonly known as: VENTOLIN HFA Inhale 2 puffs into the lungs every 4 (four) hours as needed for wheezing or shortness of breath.   CALCIUM-MAGNESIUM-ZINC-D3 PO Take by mouth daily.   dicyclomine 20 MG tablet Commonly known as: BENTYL Take 20 mg by mouth 2 (two) times a day.   Emgality 120 MG/ML [place] Generic drug: Galcanezumab-gnlm Inject 120 mg into the skin every 28 (twenty-eight) days.   escitalopram 20 MG tablet Commonly known as: LEXAPRO Take 20 mg by mouth daily. with food   lidocaine 4 % external solution Commonly known as: XYLOCAINE Apply topically as needed.   lidocaine 5 % Commonly known as: LIDODERM Place 2 patches onto the skin daily. Remove & Discard patch within 12 hours or as directed by MD What changed: how much to take   methocarbamol 500 MG tablet Commonly known as: Robaxin Take 1 tablet (500 mg total) by mouth every 8 (eight) hours  as needed for muscle spasms. What changed:   how much to take  when to take this   morphine 60 MG 12 hr tablet Commonly known as: MS CONTIN Take 1 tablet (60 mg total) by mouth every 12 (twelve) hours. Must last 30 days. What changed: Another medication with the same name was removed. Continue taking this medication, and follow the directions you see here.   ondansetron 4 MG disintegrating  tablet Commonly known as: ZOFRAN-ODT Take 4 mg by mouth every 8 (eight) hours as needed for nausea or vomiting.   pregabalin 50 MG capsule Commonly known as: LYRICA Take 50 mg by mouth 3 (three) times daily.   rizatriptan 10 MG tablet Commonly known as: MAXALT Take 10 mg by mouth as needed for migraine.   sucralfate 1 g tablet Commonly known as: CARAFATE Take 1 g by mouth 2 (two) times daily.   traMADol 50 MG tablet Commonly known as: Ultram Take 1 tablet (50 mg total) by mouth every 8 (eight) hours as needed for moderate pain.   traZODone 50 MG tablet Commonly known as: DESYREL Take 100 mg by mouth at bedtime.   triamcinolone cream 0.1 % Commonly known as: KENALOG Apply 1 application topically 2 (two) times daily.   Wellbutrin SR 150 MG 12 hr tablet Generic drug: buPROPion Take 150 mg by mouth 2 (two) times daily.        Signed: Deetta Austin 07/10/2019, 9:40 AM

## 2019-07-10 NOTE — TOC Transition Note (Signed)
Transition of Care [facility]) - CM/SW Discharge Note   Patient Details  Name: Lindsay Austin MRN: [id number] Date of Birth: [birth year]  [facility] CM/SW Contact:  Boris Sharper, LCSW Phone Number:[phone] 07/10/2019, 10:57 AM   Clinical Narrative:    Pt medically stable for discharge. CSW notified Pt's daughter who will be transporting her home. Pt has all ncessary DME.    Final next level of care: OP Rehab Barriers to Discharge: Continued Medical Work up   Patient Goals and CMS Choice Patient states their goals for this hospitalization and ongoing recovery are:: go home      Discharge Placement                       Discharge Plan and Services   Discharge Planning Services: CM Consult            DME Arranged: Gilford Rile rolling DME Agency: [employer] Date DME Agency Contacted: 07/06/19 Time DME Agency Contacted: [phone] Representative spoke with at DME Agency: [place]: NA          Social Determinants of Health ([place]) Interventions     Readmission Risk Interventions No flowsheet data found.

## 2019-07-10 NOTE — Progress Notes (Signed)
Patient adequate for discharge. VSS, patient education r/t discharge teaching and AVS complete. Patient has no questions and understands f/u appointments at this time.

## 2019-07-12 LAB — CULTURE, BLOOD (ROUTINE X 2)
Culture: NO GROWTH
Culture: NO GROWTH

## 2019-07-21 ENCOUNTER — Ambulatory Visit
Admission: RE | Admit: 2019-07-21 | Discharge: 2019-07-21 | Disposition: A | Discharge status: Home / Self Care | Payer: Medicaid Other | Source: Ambulatory Visit | Attending: Family Medicine | Admitting: Family Medicine

## 2019-07-21 DIAGNOSIS — Z1231 Encounter for screening mammogram for malignant neoplasm of breast: Secondary | ICD-10-CM

## 2019-08-29 ENCOUNTER — Emergency Department: Payer: Medicaid Other

## 2019-08-29 ENCOUNTER — Inpatient Hospital Stay
Admission: EM | Admit: 2019-08-29 | Discharge: 2019-08-31 | DRG: 917 | Disposition: A | Discharge status: Home / Self Care | Payer: Medicaid Other | Attending: Internal Medicine | Admitting: Internal Medicine

## 2019-08-29 ENCOUNTER — Observation Stay: Payer: Medicaid Other

## 2019-08-29 ENCOUNTER — Other Ambulatory Visit: Payer: Self-pay

## 2019-08-29 ENCOUNTER — Encounter: Payer: Self-pay | Admitting: Emergency Medicine

## 2019-08-29 DIAGNOSIS — Z7952 Long term (current) use of systemic steroids: Secondary | ICD-10-CM

## 2019-08-29 DIAGNOSIS — T40601D Poisoning by unspecified narcotics, accidental (unintentional), subsequent encounter: Secondary | ICD-10-CM

## 2019-08-29 DIAGNOSIS — Z9049 Acquired absence of other specified parts of digestive tract: Secondary | ICD-10-CM

## 2019-08-29 DIAGNOSIS — T402X1A Poisoning by other opioids, accidental (unintentional), initial encounter: Principal | ICD-10-CM | POA: Diagnosis present

## 2019-08-29 DIAGNOSIS — R0902 Hypoxemia: Secondary | ICD-10-CM

## 2019-08-29 DIAGNOSIS — Z85528 Personal history of other malignant neoplasm of kidney: Secondary | ICD-10-CM

## 2019-08-29 DIAGNOSIS — N179 Acute kidney failure, unspecified: Secondary | ICD-10-CM | POA: Diagnosis present

## 2019-08-29 DIAGNOSIS — Z20822 Contact with and (suspected) exposure to covid-19: Secondary | ICD-10-CM | POA: Diagnosis present

## 2019-08-29 DIAGNOSIS — J9601 Acute respiratory failure with hypoxia: Secondary | ICD-10-CM | POA: Diagnosis present

## 2019-08-29 DIAGNOSIS — M797 Fibromyalgia: Secondary | ICD-10-CM | POA: Diagnosis present

## 2019-08-29 DIAGNOSIS — Z8551 Personal history of malignant neoplasm of bladder: Secondary | ICD-10-CM

## 2019-08-29 DIAGNOSIS — K219 Gastro-esophageal reflux disease without esophagitis: Secondary | ICD-10-CM | POA: Diagnosis present

## 2019-08-29 DIAGNOSIS — Z79891 Long term (current) use of opiate analgesic: Secondary | ICD-10-CM

## 2019-08-29 DIAGNOSIS — E785 Hyperlipidemia, unspecified: Secondary | ICD-10-CM | POA: Diagnosis present

## 2019-08-29 DIAGNOSIS — Z905 Acquired absence of kidney: Secondary | ICD-10-CM

## 2019-08-29 DIAGNOSIS — I10 Essential (primary) hypertension: Secondary | ICD-10-CM | POA: Diagnosis present

## 2019-08-29 DIAGNOSIS — E875 Hyperkalemia: Secondary | ICD-10-CM | POA: Diagnosis present

## 2019-08-29 DIAGNOSIS — Z981 Arthrodesis status: Secondary | ICD-10-CM

## 2019-08-29 DIAGNOSIS — T50901A Poisoning by unspecified drugs, medicaments and biological substances, accidental (unintentional), initial encounter: Secondary | ICD-10-CM

## 2019-08-29 DIAGNOSIS — F329 Major depressive disorder, single episode, unspecified: Secondary | ICD-10-CM | POA: Diagnosis present

## 2019-08-29 DIAGNOSIS — Z79899 Other long term (current) drug therapy: Secondary | ICD-10-CM

## 2019-08-29 LAB — CBC
HCT: 41.8 % (ref 36.0–46.0)
Hemoglobin: 12.5 g/dL (ref 12.0–15.0)
MCH: 30 pg (ref 26.0–34.0)
MCHC: 29.9 g/dL — ABNORMAL LOW (ref 30.0–36.0)
MCV: 100.2 fL — ABNORMAL HIGH (ref 80.0–100.0)
Platelets: 235 10*3/uL (ref 150–400)
RBC: 4.17 MIL/uL (ref 3.87–5.11)
RDW: 13.3 % (ref 11.5–15.5)
WBC: 13 10*3/uL — ABNORMAL HIGH (ref 4.0–10.5)
nRBC: 0 % (ref 0.0–0.2)

## 2019-08-29 LAB — COMPREHENSIVE METABOLIC PANEL
ALT: 12 U/L (ref 0–44)
AST: 32 U/L (ref 15–41)
Albumin: 4.1 g/dL (ref 3.5–5.0)
Alkaline Phosphatase: 66 U/L (ref 38–126)
Anion gap: 11 (ref 5–15)
BUN: 13 mg/dL (ref 6–20)
CO2: 26 mmol/L (ref 22–32)
Calcium: 8.6 mg/dL — ABNORMAL LOW (ref 8.9–10.3)
Chloride: 103 mmol/L (ref 98–111)
Creatinine, Ser: 2.16 mg/dL — ABNORMAL HIGH (ref 0.44–1.00)
GFR calc Af Amer: 28 mL/min — ABNORMAL LOW (ref >60)
GFR calc non Af Amer: 24 mL/min — ABNORMAL LOW (ref >60)
Glucose, Bld: 119 mg/dL — ABNORMAL HIGH (ref 70–99)
Potassium: 5.2 mmol/L — ABNORMAL HIGH (ref 3.5–5.1)
Sodium: 140 mmol/L (ref 135–145)
Total Bilirubin: 0.9 mg/dL (ref 0.3–1.2)
Total Protein: 7 g/dL (ref 6.5–8.1)

## 2019-08-29 LAB — URINE DRUG SCREEN, QUALITATIVE (ARMC ONLY)
Amphetamines, Ur Screen: NOT DETECTED
Barbiturates, Ur Screen: NOT DETECTED
Benzodiazepine, Ur Scrn: NOT DETECTED
Cannabinoid 50 Ng, Ur ~~LOC~~: NOT DETECTED
Cocaine Metabolite,Ur ~~LOC~~: NOT DETECTED
MDMA (Ecstasy)Ur Screen: NOT DETECTED
Methadone Scn, Ur: NOT DETECTED
Opiate, Ur Screen: POSITIVE — AB
Phencyclidine (PCP) Ur S: NOT DETECTED
Tricyclic, Ur Screen: NOT DETECTED

## 2019-08-29 LAB — RESPIRATORY PANEL BY RT PCR (FLU A&B, COVID)
Influenza A by PCR: NEGATIVE
Influenza B by PCR: NEGATIVE
SARS Coronavirus 2 by RT PCR: NEGATIVE

## 2019-08-29 LAB — PROTEIN / CREATININE RATIO, URINE
Creatinine, Urine: 71 mg/dL
Protein Creatinine Ratio: 0.14 mg/mg{Cre} (ref 0.00–0.15)
Total Protein, Urine: 10 mg/dL

## 2019-08-29 LAB — ETHANOL: Alcohol, Ethyl (B): <10 mg/dL (ref <10)

## 2019-08-29 LAB — SODIUM, URINE, RANDOM: Sodium, Ur: 23 mmol/L

## 2019-08-29 MED ORDER — LACTATED RINGERS IV SOLN: INTRAVENOUS | Status: AC

## 2019-08-29 MED ORDER — SODIUM CHLORIDE 0.9 % IV BOLUS
500.0000 mL | Freq: Once | INTRAVENOUS | Status: AC
  Administered 2019-08-29: 500 mL via INTRAVENOUS

## 2019-08-29 MED ORDER — GUAIFENESIN-DM 100-10 MG/5ML PO SYRP
10.0000 mL | ORAL_SOLUTION | Freq: Four times a day (QID) | ORAL | Status: DC | PRN
  Filled 2019-08-29: qty 10

## 2019-08-29 MED ORDER — ALUM & MAG HYDROXIDE-SIMETH 200-200-20 MG/5ML PO SUSP: 15.0000 mL | Freq: Four times a day (QID) | ORAL | Status: DC | PRN

## 2019-08-29 MED ORDER — HEPARIN SODIUM (PORCINE) 5000 UNIT/ML IJ SOLN
5000.0000 [IU] | Freq: Three times a day (TID) | INTRAMUSCULAR | Status: DC
  Administered 2019-08-29 – 2019-08-31 (×4): 5000 [IU] via SUBCUTANEOUS
  Filled 2019-08-29 (×4): qty 1

## 2019-08-29 MED ORDER — HYDROCODONE-ACETAMINOPHEN 5-325 MG PO TABS
1.0000 | ORAL_TABLET | Freq: Four times a day (QID) | ORAL | Status: DC | PRN
  Administered 2019-08-29 – 2019-08-31 (×4): 2 via ORAL
  Filled 2019-08-29 (×4): qty 2

## 2019-08-29 MED ORDER — POLYETHYLENE GLYCOL 3350 17 G PO PACK: 17.0000 g | PACK | Freq: Two times a day (BID) | ORAL | Status: DC | PRN

## 2019-08-29 MED ORDER — ONDANSETRON HCL 4 MG/2ML IJ SOLN: 4.0000 mg | Freq: Four times a day (QID) | INTRAMUSCULAR | Status: DC | PRN

## 2019-08-29 MED ORDER — HYDROCODONE-ACETAMINOPHEN 5-325 MG PO TABS: 1.0000 | ORAL_TABLET | Freq: Four times a day (QID) | ORAL | Status: DC | PRN

## 2019-08-29 MED ORDER — CALCIUM CARBONATE ANTACID 500 MG PO CHEW: 1.0000 | CHEWABLE_TABLET | Freq: Three times a day (TID) | ORAL | Status: DC | PRN

## 2019-08-29 MED ORDER — ACETAMINOPHEN 500 MG PO TABS: 1000.0000 mg | ORAL_TABLET | Freq: Three times a day (TID) | ORAL | Status: DC | PRN

## 2019-08-29 MED ORDER — ONDANSETRON 4 MG PO TBDP: 4.0000 mg | ORAL_TABLET | Freq: Three times a day (TID) | ORAL | Status: DC | PRN

## 2019-08-29 MED ORDER — DOCUSATE SODIUM 100 MG PO CAPS: 100.0000 mg | ORAL_CAPSULE | Freq: Two times a day (BID) | ORAL | Status: DC | PRN

## 2019-08-29 NOTE — ED Notes (Signed)
Pt O2 sat desat to 87% on RA. Pt placed on 2L [place], pt O2 sat increased to 94% at this time.

## 2019-08-29 NOTE — ED Provider Notes (Signed)
[facility] Emergency Department Provider Note   ____________________________________________    I have reviewed the triage vital signs and the nursing notes.   HISTORY  Chief Complaint Drug Overdose   History limited by altered mental status  HPI Lindsay Austin is a 59 y.o. female who presents with altered mental status.  EMS reports they were called to the house for altered mental status, found the patient with agonal breathing.  Did give nasal Narcan followed by IV Narcan with significant improvement in breathing/respiratory status.  Continued mild confusion upon arrival here but clearing quickly.  No reports of fever chills.  Recent cervical fusion.  Apparently the patient has been on chronic narcotics for quite some time.  Past Medical History:  Diagnosis Date  . Anxiety   . Arthritis   . Bladder cancer ([place])   . Cancer of kidney ([place])   . DDD (degenerative disc disease), cervical   . Depression   . Failed back syndrome   . Fibromyalgia   . GERD (gastroesophageal reflux disease)   . Headache    migraines   . Heart murmur   . Hyperlipemia   . Hypertension    not on any medications  . IBS (irritable bowel syndrome)   . Kidney failure   . Renal insufficiency   . RSD (reflex sympathetic dystrophy)   . Seizure ([place])   . Stomach ulcer   . Trigeminal neuralgia     Patient Active Problem List   Diagnosis Date Noted  . AKI (acute kidney injury) ([place]) 08/29/2019  . Acute respiratory failure with hypoxia ([place]) 07/07/2019  . HTN (hypertension) 07/07/2019  . GERD (gastroesophageal reflux disease) 07/07/2019  . Acute metabolic encephalopathy 123456  . Hypoglycemia 07/07/2019  . Gastric ulcer 07/07/2019  . S/P cervical spinal fusion 07/05/2019  . Contusion of right hip 06/03/2019  . Spinal stenosis in cervical region 06/03/2019  . Cervical spondylosis without myelopathy 09/30/2018  . Preoperative testing 09/30/2018  . Major depressive  disorder with current active episode 07/23/2018  . Lumbar spondylosis 07/23/2018  . Hx of lumbosacral spine surgery 01/15/2018  . History of lumbar fusion (L5-S1 fusion) 01/15/2018  . Failed spinal cord stimulator, sequela 01/15/2018  . Cervical fusion syndrome (C4-C7) 01/15/2018  . Chronic pain syndrome 01/15/2018    Past Surgical History:  Procedure Laterality Date  . BACK SURGERY  1995   x 7  . BRAIN SURGERY  2004  . BREAST BIOPSY Left 2017   benign w/ clip  . CHOLECYSTECTOMY    . colonscopy  2006  . NEPHROURETERECTOMY  2011  . POSTERIOR CERVICAL FUSION/FORAMINOTOMY Bilateral 07/05/2019   Procedure: C5-T2 POSTERIOR DECOMPRESSION AND FUSION;  Surgeon: Deetta Perla, MD;  Location: [facility] ORS;  Service: Neurosurgery;  Laterality: Bilateral;  . POSTERIOR CERVICAL LAMINECTOMY Bilateral 07/05/2019   Procedure: C6-7 LAMINECTOMY;  Surgeon: Deetta Perla, MD;  Location: [facility] ORS;  Service: Neurosurgery;  Laterality: Bilateral;  . SPINAL CORD STIMULATOR IMPLANT  2004  . TRANSURETHRAL RESECTION OF BLADDER TUMOR WITH GYRUS (TURBT-GYRUS)  2012, 2013  . trigeminal neuralgia  2004    Prior to Admission medications   Medication Sig Start Date End Date Taking? Authorizing Provider  acetaminophen (TYLENOL) 500 MG tablet Take 2 tablets (1,000 mg total) by mouth 2 (two) times daily as needed. 06/03/19 06/02/20  [name], MD  albuterol (VENTOLIN HFA) 108 (90 Base) MCG/ACT inhaler Inhale 2 puffs into the lungs every 4 (four) hours as needed for wheezing or shortness of breath.  01/22/19   [provider]  buPROPion (WELLBUTRIN SR) 150 MG 12 hr tablet Take 150 mg by mouth 2 (two) times daily.    [provider]  dicyclomine (BENTYL) 20 MG tablet Take 20 mg by mouth 2 (two) times a day.     [provider]  escitalopram (LEXAPRO) 20 MG tablet Take 20 mg by mouth daily. with food 01/22/19   [provider]  Galcanezumab-gnlm (EMGALITY) 120 MG/ML SOAJ Inject 120 mg into the  skin every 28 (twenty-eight) days.  04/15/19   [provider]  lidocaine (LIDODERM) 5 % Place 2 patches onto the skin daily. Remove & Discard patch within 12 hours or as directed by MD 07/10/19   Deetta Perla, MD  lidocaine (XYLOCAINE) 4 % external solution Apply topically as needed.    [provider]  methocarbamol (ROBAXIN) 500 MG tablet Take 1 tablet (500 mg total) by mouth every 8 (eight) hours as needed for muscle spasms. 07/10/19   Deetta Perla, MD  Multiple Minerals-Vitamins (CALCIUM-MAGNESIUM-ZINC-D3 PO) Take by mouth daily.    [provider]  ondansetron (ZOFRAN-ODT) 4 MG disintegrating tablet Take 4 mg by mouth every 8 (eight) hours as needed for nausea or vomiting.  12/10/17   [provider]  pregabalin (LYRICA) 50 MG capsule Take 50 mg by mouth 3 (three) times daily.  04/15/19   [provider]  rizatriptan (MAXALT) 10 MG tablet Take 10 mg by mouth as needed for migraine.  04/15/19   [provider]  sucralfate (CARAFATE) 1 g tablet Take 1 g by mouth 2 (two) times daily.    [provider]  traMADol (ULTRAM) 50 MG tablet Take 1 tablet (50 mg total) by mouth every 8 (eight) hours as needed for moderate pain. 07/10/19 07/09/20  Deetta Perla, MD  traZODone (DESYREL) 50 MG tablet Take 100 mg by mouth at bedtime.  12/10/17   [provider]  triamcinolone cream (KENALOG) 0.1 % Apply 1 application topically 2 (two) times daily.    [provider]     Allergies Methadone, Other, and Tape  Family History  Problem Relation Age of Onset  . Diabetes Mother   . Heart disease Mother   . Dementia Father   . Kidney disease Father   . Breast cancer Neg Hx     Social History Social History   Tobacco Use  . Smoking status: Never Smoker  . Smokeless tobacco: Never Used  Substance Use Topics  . Alcohol use: Never  . Drug use: Never    Limited review of systems secondary to altered mental  status     ____________________________________________   PHYSICAL EXAM:  VITAL SIGNS: ED Triage Vitals  Enc Vitals Group     BP 08/29/19 1124 (!) 147/90     Pulse Rate 08/29/19 1124 (!) 123     Resp 08/29/19 1124 (!) 26     Temp 08/29/19 1124 98 F (36.7 C)     Temp Source 08/29/19 1124 Axillary     SpO2 08/29/19 1124 98 %     Weight 08/29/19 1122 86.2 kg (190 lb)     Height 08/29/19 1122 1.727 m (5\' 8" )     Head Circumference --      Peak Flow --      Pain Score --      Pain Loc --      Pain Edu? --      Excl. in [place]? --     Constitutional: Alert  but disoriented Eyes: Conjunctivae are normal.  Head: Atraumatic. Nose: No congestion/rhinnorhea. Mouth/Throat: Mucous membranes are moist.    Cardiovascular: Tachycardia regular rhythm. Grossly normal heart sounds.  Good peripheral circulation. Respiratory: Normal respiratory effort.  No retractions. Lungs CTAB. Gastrointestinal: Soft and nontender. No distention.  No CVA tenderness.  Musculoskeletal:  Warm and well perfused Neurologic: Moves all extremities equally. Skin:  Skin is warm, dry and intact. No rash noted. Psychiatric: Mood and affect are normal. Speech and behavior are normal.  ____________________________________________   LABS (all labs ordered are listed, but only abnormal results are displayed)  Labs Reviewed  CBC - Abnormal; Notable for the following components:      Result Value   WBC 13.0 (*)    MCV 100.2 (*)    MCHC 29.9 (*)    All other components within normal limits  COMPREHENSIVE METABOLIC PANEL - Abnormal; Notable for the following components:   Potassium 5.2 (*)    Glucose, Bld 119 (*)    Creatinine, Ser 2.16 (*)    Calcium 8.6 (*)    GFR calc non Af Amer 24 (*)    GFR calc Af Amer 28 (*)    All other components within normal limits  RESPIRATORY PANEL BY RT PCR (FLU A&B, COVID)  ETHANOL  URINE DRUG SCREEN, QUALITATIVE ([facility] ONLY)  SODIUM, URINE, RANDOM  PROTEIN / CREATININE  RATIO, URINE   ____________________________________________  EKG  ED ECG REPORT I, Lavonia Drafts, the attending physician, personally viewed and interpreted this ECG.  Date: 08/29/2019  Rhythm: Sinus tachycardia QRS Axis: normal Intervals: normal ST/T Wave abnormalities: normal Narrative Interpretation: no evidence of acute ischemia  ____________________________________________  RADIOLOGY  Chest x-ray reviewed reviewed by me and unremarkable ____________________________________________   PROCEDURES  Procedure(s) performed: No  Procedures   Critical Care performed: yes  CRITICAL CARE Performed by: Lavonia Drafts   Total critical care time: 30 minutes  Critical care time was exclusive of separately billable procedures and treating other patients.  Critical care was necessary to treat or prevent imminent or life-threatening deterioration.  Critical care was time spent personally by me on the following activities: development of treatment plan with patient and/or surrogate as well as nursing, discussions with consultants, evaluation of patient's response to treatment, examination of patient, obtaining history from patient or surrogate, ordering and performing treatments and interventions, ordering and review of laboratory studies, ordering and review of radiographic studies, pulse oximetry and re-evaluation of patient's condition.  ____________________________________________   INITIAL IMPRESSION / ASSESSMENT AND PLAN / ED COURSE  Pertinent labs & imaging results that were available during my care of the patient were reviewed by me and considered in my medical decision making (see chart for details).  Patient presents with likely opioid overdose, unclear initially whether this was accidental or intentional.  Family feels that it is likely accidental given her long history with morphine.   found to be mildly hypoxic in the emergency department, question aspiration,  started on 2 L nasal cannula.  Chest x-ray does not demonstrate any clear evidence of aspirate or pneumonia.  Lab work demonstrates mildly elevated white blood cell count however patient is afebrile.  Elevation in creatinine of 2.16 is increased from prior creatinine of 1.0 consistent with acute kidney injury.  Alcohol level is normal.  Patient's potassium is 5.2, suspect hemolysis, IV fluids infusing   Patient improving with time significantly, now she seems to be at her baseline.  She does confirm this was accidental.  Continues to be  mildly hypoxic, will discuss with hospitalist for admission.      ____________________________________________   FINAL CLINICAL IMPRESSION(S) / ED DIAGNOSES  Final diagnoses:  Accidental drug overdose, initial encounter  Acute kidney injury ([place])  Hypoxia        Note:  This document was prepared using Dragon voice recognition software and may include unintentional dictation errors.   Lavonia Drafts, MD 08/29/19 1348

## 2019-08-29 NOTE — ED Notes (Signed)
Pt asleep at this time. Pt connected to cardiac monitoring and V/S WNL

## 2019-08-29 NOTE — Plan of Care (Signed)
Continuing with plan of care. 

## 2019-08-29 NOTE — ED Notes (Signed)
Pt assisted to the restroom with standby assistance.

## 2019-08-29 NOTE — ED Notes (Signed)
Pt more comfortable at this time. Pt is A&Ox4 and NAD.

## 2019-08-29 NOTE — ED Triage Notes (Signed)
Pt via EMS from home. Pt is here for a potential OD. Pt was found unresponsive with agonal repsirations by family. Pt had recent back surgery. Pt potentially overdosed on morphine and trazodone. EMS gave 2 of Narcan IV and 1 Narcan intranasally. On arrival, pt very fidgety and restless. Unable to sit still. Pt is alert but not answering questions. Unknown if this was intentional.

## 2019-08-29 NOTE — H&P (Signed)
History and Physical    Lindsay Austin [id number] DOB: 06/18/1960 DOA: 08/29/2019  PCP: [name], PA  Patient coming from: home  I have personally briefly reviewed patient's old medical records in [place]  Chief Complaint: unresponsiveness  HPI: Lindsay Austin is a 59 y.o. 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   Pt's daughter reported finding her mother this morning unresponsive, with O2 sat down to 40's.  Pt is reportedly on MSContin 60 mg BID since her spine fusion on 07/05/19, but likely accidentally took 1 extra last night.  Pt, however, was noted to be more sleepy for the past day or 2.  Pt has taken opioids pain meds for the past 30 years for back pain, as well as on a lot of other sedating medications.  Pt has intermittent dyspnea at baseline and uses albuterol inhaler PRN, however, does not smoke nor have a dx of COPD or asthma.  Daughter suspects sleep apnea.  Pt reported normal PO intake and drinking plenty of fluids with normal urination. No recent change in medication or recent abx.  No fever, chest pain, abdominal pain, N/V, dysuria, increased swelling.  Of note, pt has a hx of kidney and bladder cancer and has only 1 kidney.    ED Course: EMS gave 2 of Narcan IV and 1 Narcan intranasally.  On arrival to ED, initial vitals: afebrile, pulse 123, BP 147/90, sating 98% on room air.  Later ED staff noted "Pt O2 sat desat to 87% on RA. Pt placed on 2L [place], pt O2 sat increased to 94% at this time." Labs notable for WBC 13, K 5.2, Cr 2.16 (last Cr 1.01 about 2 months ago), BUN 13, alcohol <10, CXR no acute finding.  Pt was given NS 500 ml and admitted for observation.   Assessment/Plan Active Problems:   AKI (acute kidney injury) ([place])  # AKI # Solitary kidney --Cr 2.16 (last Cr 1.01 about 2 months ago), BUN 13, not consistent  with dehydration.   PLAN: --LR@100  for 10 hours --US renal --urine studies  # Unresponsiveness and respiratory depression  # 2/2 Accidental overdose --Pt unintentional took an extra MSContin 60 mg the night before, however, given AKI, may have decreased clearance as well.  Mental status back to baseline after Narcan given EMS.  Pt is also on Robaxin, Lyrica, trazodone at home. PLAN: --Avoid long-acting opioids for now.  --Norco 1-2 tablets q6h PRN (daughter said pt will go into withdrawal without her opioids pain meds) --Hold other home sedating meds, Robaxin, Lyrica, trazodone --UDS  # Acute hypoxic respiratory failure --O2 sat desat to 87% on RA in the ED. Pt placed on 2L [place].  Likely due to respiratory depression from overdose and/or undiagnosed sleep apnea. PLAN: --Avoiding sedating medications as above --outpatient sleep study  # Hyperkalemia --K 5.2, slightly elevated. --Monitor for now.   DVT prophylaxis: Heparin SQ Code Status: Full code  Family Communication: daughter updated at the bedside   Disposition Plan: home  Consults called:  Admission status: Observation   Review of Systems: As per HPI otherwise 10 point review of systems negative.   Past Medical History:  Diagnosis Date  . Anxiety   . Arthritis   . Bladder cancer ([place])   . Cancer of kidney ([place])   . DDD (degenerative disc disease), cervical   . Depression   . Failed back  syndrome   . Fibromyalgia   . GERD (gastroesophageal reflux disease)   . Headache    migraines   . Heart murmur   . Hyperlipemia   . Hypertension    not on any medications  . IBS (irritable bowel syndrome)   . Kidney failure   . Renal insufficiency   . RSD (reflex sympathetic dystrophy)   . Seizure ([place])   . Stomach ulcer   . Trigeminal neuralgia     Past Surgical History:  Procedure Laterality Date  . BACK SURGERY  1995   x 7  . BRAIN SURGERY  2004  . BREAST BIOPSY Left 2017   benign w/ clip  . CHOLECYSTECTOMY     . colonscopy  2006  . NEPHROURETERECTOMY  2011  . POSTERIOR CERVICAL FUSION/FORAMINOTOMY Bilateral 07/05/2019   Procedure: C5-T2 POSTERIOR DECOMPRESSION AND FUSION;  Surgeon: Deetta Perla, MD;  Location: [facility] ORS;  Service: Neurosurgery;  Laterality: Bilateral;  . POSTERIOR CERVICAL LAMINECTOMY Bilateral 07/05/2019   Procedure: C6-7 LAMINECTOMY;  Surgeon: Deetta Perla, MD;  Location: [facility] ORS;  Service: Neurosurgery;  Laterality: Bilateral;  . SPINAL CORD STIMULATOR IMPLANT  2004  . TRANSURETHRAL RESECTION OF BLADDER TUMOR WITH GYRUS (TURBT-GYRUS)  2012, 2013  . trigeminal neuralgia  2004     reports that she has never smoked. She has never used smokeless tobacco. She reports that she does not drink alcohol or use drugs.  Allergies  Allergen Reactions  . Methadone Palpitations  . Other Swelling and Other (See Comments)    Tricyclic antidepressants Unable to void Tongue Swelling    . Tape Other (See Comments)    Blisters  Paper tape ok Tegaderm ok     Family History  Problem Relation Age of Onset  . Diabetes Mother   . Heart disease Mother   . Dementia Father   . Kidney disease Father   . Breast cancer Neg Hx      Prior to Admission medications   Medication Sig Start Date End Date Taking? Authorizing Provider  acetaminophen (TYLENOL) 500 MG tablet Take 2 tablets (1,000 mg total) by mouth 2 (two) times daily as needed. 06/03/19 06/02/20 Yes [name], MD  albuterol (VENTOLIN HFA) 108 (90 Base) MCG/ACT inhaler Inhale 2 puffs into the lungs every 4 (four) hours as needed for wheezing or shortness of breath.  01/22/19  Yes [provider]  buPROPion (WELLBUTRIN SR) 150 MG 12 hr tablet Take 150 mg by mouth 2 (two) times daily.   Yes [provider]  dicyclomine (BENTYL) 20 MG tablet Take 20 mg by mouth 3 (three) times daily before meals.    Yes [provider]  escitalopram (LEXAPRO) 20 MG tablet Take 20 mg by mouth daily. with food 01/22/19  Yes [provider]  Galcanezumab-gnlm (EMGALITY) 120 MG/ML SOAJ Inject 120 mg into the skin every 28 (twenty-eight) days.  04/15/19  Yes [provider]  lidocaine (LIDODERM) 5 % Place 2 patches onto the skin daily. Remove & Discard patch within 12 hours or as directed by MD 07/10/19  Yes Deetta Perla, MD  lidocaine (XYLOCAINE) 4 % external solution Apply topically as needed.   Yes [provider]  methocarbamol (ROBAXIN) 500 MG tablet Take 1 tablet (500 mg total) by mouth every 8 (eight) hours as needed for muscle spasms. 07/10/19  Yes Deetta Perla, MD  morphine (MS CONTIN) 60 MG 12 hr tablet SMARTSIG:1 Tablet(s) By Mouth Every 12 Hours 08/17/19  Yes [provider]  Multiple  Minerals-Vitamins (CALCIUM-MAGNESIUM-ZINC-D3 PO) Take by mouth daily.   Yes [provider]  ondansetron (ZOFRAN-ODT) 4 MG disintegrating tablet Take 4 mg by mouth every 8 (eight) hours as needed for nausea or vomiting.  12/10/17  Yes [provider]  pregabalin (LYRICA) 50 MG capsule Take 50 mg by mouth 3 (three) times daily.  04/15/19  Yes [provider]  rizatriptan (MAXALT) 10 MG tablet Take 10 mg by mouth as needed for migraine.  04/15/19  Yes [provider]  sucralfate (CARAFATE) 1 g tablet Take 1 g by mouth 2 (two) times daily.   Yes [provider]  traMADol (ULTRAM) 50 MG tablet Take 1 tablet (50 mg total) by mouth every 8 (eight) hours as needed for moderate pain. 07/10/19 07/09/20 Yes Deetta Perla, MD  traZODone (DESYREL) 50 MG tablet Take 100 mg by mouth at bedtime.  12/10/17  Yes [provider]  triamcinolone cream (KENALOG) 0.1 % Apply 1 application topically 2 (two) times daily.   Yes [provider]    Physical Exam: Vitals:   08/29/19 1246 08/29/19 1300 08/29/19 1330 08/29/19 1400  BP: (!) 137/92 100/86 (!) 130/99 132/89  Pulse: 99 100 (!) 112 97  Resp: (!) 21 17 (!) 22 18  Temp:      TempSrc:      SpO2: 97% 95% 94% 94%   Weight:      Height:        Constitutional: NAD, AAOx3 HEENT: conjunctivae and lids normal, EOMI, hard of hearing CV: RRR no M,R,G. Distal pulses +2.  No cyanosis.   RESP: CTA B/L, normal respiratory effort  GI: +BS, NTND Extremities: No effusions, edema, or tenderness in BLE SKIN: warm, dry and intact Neuro: II - XII grossly intact.  Sensation intact Psych: Normal mood and affect.     Labs on Admission: I have personally reviewed following labs and imaging studies  CBC: Recent Labs  Lab 08/29/19 1134  WBC 13.0*  HGB 12.5  HCT 41.8  MCV 100.2*  PLT [id number]   Basic Metabolic Panel: Recent Labs  Lab 08/29/19 1134  NA 140  K 5.2*  CL 103  CO2 26  GLUCOSE 119*  BUN 13  CREATININE 2.16*  CALCIUM 8.6*   GFR: Estimated Creatinine Clearance: 32.6 mL/min (A) (by C-G formula based on SCr of 2.16 mg/dL (H)). Liver Function Tests: Recent Labs  Lab 08/29/19 1134  AST 32  ALT 12  ALKPHOS 66  BILITOT 0.9  PROT 7.0  ALBUMIN 4.1   No results for input(s): LIPASE, AMYLASE in the last 168 hours. No results for input(s): AMMONIA in the last 168 hours. Coagulation Profile: No results for input(s): INR, PROTIME in the last 168 hours. Cardiac Enzymes: No results for input(s): CKTOTAL, CKMB, CKMBINDEX, TROPONINI in the last 168 hours. BNP (last 3 results) No results for input(s): PROBNP in the last 8760 hours. HbA1C: No results for input(s): HGBA1C in the last 72 hours. CBG: No results for input(s): GLUCAP in the last 168 hours. Lipid Profile: No results for input(s): CHOL, HDL, LDLCALC, TRIG, CHOLHDL, LDLDIRECT in the last 72 hours. Thyroid Function Tests: No results for input(s): TSH, T4TOTAL, FREET4, T3FREE, THYROIDAB in the last 72 hours. Anemia Panel: No results for input(s): VITAMINB12, FOLATE, FERRITIN, TIBC, IRON, RETICCTPCT in the last 72 hours. Urine analysis:    Component Value Date/Time   COLORURINE YELLOW (A) 07/01/2019 1034   APPEARANCEUR CLEAR (A)  07/01/2019 1034   APPEARANCEUR Clear 04/19/2019 1025   LABSPEC  1.015 07/01/2019 1034   PHURINE 7.0 07/01/2019 [place] 07/01/2019 1034   HGBUR NEGATIVE 07/01/2019 [place] 07/01/2019 1034   BILIRUBINUR Negative 04/19/2019 1025   [place] 07/01/2019 1034   PROTEINUR NEGATIVE 07/01/2019 1034   NITRITE NEGATIVE 07/01/2019 1034   LEUKOCYTESUR NEGATIVE 07/01/2019 1034    Radiological Exams on Admission: DG Chest Portable 1 View  Result Date: 08/29/2019 CLINICAL DATA:  Weakness, found unresponsive EXAM: PORTABLE CHEST 1 VIEW COMPARISON:  07/07/2019 chest radiograph. FINDINGS: Anterior and bilateral posterior spinal fusion hardware in the lower cervical spine. Low lung volumes. Stable cardiomediastinal silhouette with normal heart size. No pneumothorax. No pleural effusion. Lungs appear clear, with no acute consolidative airspace disease and no pulmonary edema. IMPRESSION: Low lung volumes. No active cardiopulmonary disease. Electronically Signed   By: Ilona Sorrel M.D.   On: 08/29/2019 12:04      Enzo Bi MD [facility]  If 7PM-7AM, please contact night-coverage 08/29/2019, 3:40 PM

## 2019-08-30 ENCOUNTER — Telehealth: Payer: Self-pay

## 2019-08-30 ENCOUNTER — Encounter: Payer: Self-pay | Admitting: Hospitalist

## 2019-08-30 DIAGNOSIS — Z8551 Personal history of malignant neoplasm of bladder: Secondary | ICD-10-CM | POA: Diagnosis not present

## 2019-08-30 DIAGNOSIS — Z7952 Long term (current) use of systemic steroids: Secondary | ICD-10-CM | POA: Diagnosis not present

## 2019-08-30 DIAGNOSIS — Z905 Acquired absence of kidney: Secondary | ICD-10-CM | POA: Diagnosis not present

## 2019-08-30 DIAGNOSIS — Z9049 Acquired absence of other specified parts of digestive tract: Secondary | ICD-10-CM | POA: Diagnosis not present

## 2019-08-30 DIAGNOSIS — Z20822 Contact with and (suspected) exposure to covid-19: Secondary | ICD-10-CM | POA: Diagnosis present

## 2019-08-30 DIAGNOSIS — R0902 Hypoxemia: Secondary | ICD-10-CM | POA: Diagnosis not present

## 2019-08-30 DIAGNOSIS — E875 Hyperkalemia: Secondary | ICD-10-CM | POA: Diagnosis present

## 2019-08-30 DIAGNOSIS — E785 Hyperlipidemia, unspecified: Secondary | ICD-10-CM | POA: Diagnosis present

## 2019-08-30 DIAGNOSIS — F329 Major depressive disorder, single episode, unspecified: Secondary | ICD-10-CM | POA: Diagnosis present

## 2019-08-30 DIAGNOSIS — I10 Essential (primary) hypertension: Secondary | ICD-10-CM | POA: Diagnosis present

## 2019-08-30 DIAGNOSIS — Z85528 Personal history of other malignant neoplasm of kidney: Secondary | ICD-10-CM | POA: Diagnosis not present

## 2019-08-30 DIAGNOSIS — Z981 Arthrodesis status: Secondary | ICD-10-CM | POA: Diagnosis not present

## 2019-08-30 DIAGNOSIS — J9601 Acute respiratory failure with hypoxia: Secondary | ICD-10-CM | POA: Diagnosis present

## 2019-08-30 DIAGNOSIS — T40601D Poisoning by unspecified narcotics, accidental (unintentional), subsequent encounter: Secondary | ICD-10-CM | POA: Diagnosis not present

## 2019-08-30 DIAGNOSIS — N179 Acute kidney failure, unspecified: Secondary | ICD-10-CM | POA: Diagnosis present

## 2019-08-30 DIAGNOSIS — M797 Fibromyalgia: Secondary | ICD-10-CM | POA: Diagnosis present

## 2019-08-30 DIAGNOSIS — Z79899 Other long term (current) drug therapy: Secondary | ICD-10-CM | POA: Diagnosis not present

## 2019-08-30 DIAGNOSIS — K219 Gastro-esophageal reflux disease without esophagitis: Secondary | ICD-10-CM | POA: Diagnosis present

## 2019-08-30 DIAGNOSIS — Z79891 Long term (current) use of opiate analgesic: Secondary | ICD-10-CM | POA: Diagnosis not present

## 2019-08-30 DIAGNOSIS — T402X1A Poisoning by other opioids, accidental (unintentional), initial encounter: Secondary | ICD-10-CM | POA: Diagnosis not present

## 2019-08-30 LAB — MAGNESIUM: Magnesium: 2.4 mg/dL (ref 1.7–2.4)

## 2019-08-30 LAB — CBC
HCT: 39 % (ref 36.0–46.0)
Hemoglobin: 12.3 g/dL (ref 12.0–15.0)
MCH: 29.9 pg (ref 26.0–34.0)
MCHC: 31.5 g/dL (ref 30.0–36.0)
MCV: 94.9 fL (ref 80.0–100.0)
Platelets: 174 10*3/uL (ref 150–400)
RBC: 4.11 MIL/uL (ref 3.87–5.11)
RDW: 12.9 % (ref 11.5–15.5)
WBC: 7.8 10*3/uL (ref 4.0–10.5)
nRBC: 0 % (ref 0.0–0.2)

## 2019-08-30 LAB — BASIC METABOLIC PANEL
Anion gap: 9 (ref 5–15)
BUN: 16 mg/dL (ref 6–20)
CO2: 29 mmol/L (ref 22–32)
Calcium: 9.1 mg/dL (ref 8.9–10.3)
Chloride: 101 mmol/L (ref 98–111)
Creatinine, Ser: 1.65 mg/dL — ABNORMAL HIGH (ref 0.44–1.00)
GFR calc Af Amer: 39 mL/min — ABNORMAL LOW (ref >60)
GFR calc non Af Amer: 34 mL/min — ABNORMAL LOW (ref >60)
Glucose, Bld: 92 mg/dL (ref 70–99)
Potassium: 4.2 mmol/L (ref 3.5–5.1)
Sodium: 139 mmol/L (ref 135–145)

## 2019-08-30 LAB — HIV ANTIBODY (ROUTINE TESTING W REFLEX): HIV Screen 4th Generation wRfx: NONREACTIVE

## 2019-08-30 MED ORDER — DICYCLOMINE HCL 20 MG PO TABS
20.0000 mg | ORAL_TABLET | Freq: Three times a day (TID) | ORAL | Status: DC
  Administered 2019-08-30 – 2019-08-31 (×2): 20 mg via ORAL
  Filled 2019-08-30 (×4): qty 1

## 2019-08-30 MED ORDER — PREGABALIN 50 MG PO CAPS
50.0000 mg | ORAL_CAPSULE | Freq: Three times a day (TID) | ORAL | Status: DC
  Administered 2019-08-30 – 2019-08-31 (×3): 50 mg via ORAL
  Filled 2019-08-30 (×3): qty 1

## 2019-08-30 MED ORDER — ESCITALOPRAM OXALATE 10 MG PO TABS
20.0000 mg | ORAL_TABLET | Freq: Every day | ORAL | Status: DC
  Administered 2019-08-30 – 2019-08-31 (×2): 20 mg via ORAL
  Filled 2019-08-30 (×2): qty 2

## 2019-08-30 MED ORDER — TRAMADOL HCL 50 MG PO TABS: 50.0000 mg | ORAL_TABLET | Freq: Three times a day (TID) | ORAL | Status: DC | PRN

## 2019-08-30 MED ORDER — METHOCARBAMOL 500 MG PO TABS: 500.0000 mg | ORAL_TABLET | Freq: Three times a day (TID) | ORAL | Status: DC | PRN

## 2019-08-30 MED ORDER — MORPHINE SULFATE ER 30 MG PO TBCR
60.0000 mg | EXTENDED_RELEASE_TABLET | Freq: Two times a day (BID) | ORAL | Status: DC
  Administered 2019-08-30 – 2019-08-31 (×2): 60 mg via ORAL
  Filled 2019-08-30 (×2): qty 2

## 2019-08-30 MED ORDER — LACTATED RINGERS IV SOLN: INTRAVENOUS | Status: DC

## 2019-08-30 MED ORDER — TRAZODONE HCL 100 MG PO TABS
100.0000 mg | ORAL_TABLET | Freq: Every day | ORAL | Status: DC
  Administered 2019-08-30: 100 mg via ORAL
  Filled 2019-08-30: qty 1

## 2019-08-30 MED ORDER — BUPROPION HCL ER (SR) 150 MG PO TB12
150.0000 mg | ORAL_TABLET | Freq: Two times a day (BID) | ORAL | Status: DC
  Administered 2019-08-30 – 2019-08-31 (×2): 150 mg via ORAL
  Filled 2019-08-30 (×3): qty 1

## 2019-08-30 MED ORDER — ALBUTEROL SULFATE (2.5 MG/3ML) 0.083% IN NEBU: 2.5000 mg | INHALATION_SOLUTION | RESPIRATORY_TRACT | Status: DC | PRN

## 2019-08-30 MED ORDER — SUCRALFATE 1 G PO TABS
1.0000 g | ORAL_TABLET | Freq: Two times a day (BID) | ORAL | Status: DC
  Administered 2019-08-30 – 2019-08-31 (×2): 1 g via ORAL
  Filled 2019-08-30 (×2): qty 1

## 2019-08-30 NOTE — Telephone Encounter (Signed)
Attempted to call for pre virtual appointment questions.  There was no answer and no answering machine.

## 2019-08-30 NOTE — Progress Notes (Signed)
Progress Note    Lindsay Austin  [id number] DOB: Oct 29, 1960  DOA: 08/29/2019 PCP: [name], PA      Brief Narrative:    Medical records reviewed and are as summarized below:  Lindsay Austin is an 59 y.o. female       Assessment/Plan:   Active Problems:   AKI (acute kidney injury) ([place])   AKI in a patient with solitary right kidney: Continue IV fluids.  Monitor BMP. No acute abnormality on renal sonogram.  Accidental opioid overdose leading to unresponsiveness and respiratory depression: Aspiratory depression and mental status have improved.  Acute hypoxemic respiratory failure: Improved.  Degenerative disc disease of cervical spine, reflect sympathetic dystrophy, fibromyalgia: Continue home analgesics/opioids and monitor for side effects.  Depression: Continue antidepressants.  Body mass index is 28.89 kg/m.   Family Communication/Anticipated D/C date and plan/Code Status   DVT prophylaxis: Heparin Code Status: Full code Family Communication: Plan discussed with patient Disposition Plan:    Status is: Inpatient  Remains inpatient appropriate because:IV treatments appropriate due to intensity of illness or inability to take PO   Dispo: The patient is from: Home              Anticipated d/c is to: Home              Anticipated d/c date is: 1 day              Patient currently is not medically stable to d/c.            Subjective:   No complaints.  She feels better.  No headache, dizziness, shortness of breath. Review  Objective:    Vitals:   08/29/19 1934 08/29/19 2304 08/30/19 0501 08/30/19 1320  BP: (!) 108/55 108/67 127/78 (!) 152/79  Pulse: 66 64 (!) 53 (!) 57  Resp: 16 16 16 16   Temp: 99.6 F (37.6 C) 99 F (37.2 C) 98.3 F (36.8 C) 98.3 F (36.8 C)  TempSrc: Oral Oral Oral Oral  SpO2: 99% 99% 96% 95%  Weight:      Height:       No data found.   Intake/Output Summary (Last 24 hours) at 08/30/2019 1548 Last data  filed at 08/30/2019 1019 Gross per 24 hour  Intake 1625.55 ml  Output --  Net 1625.55 ml   Filed Weights   08/29/19 1122  Weight: 86.2 kg    Exam:  GEN: NAD SKIN: No rash EYES: EOMI ENT: MMM CV: RRR PULM: CTA B ABD: soft, ND, NT, +BS CNS: AAO x 3, non focal EXT: No edema or tenderness   Data Reviewed:   I have personally reviewed following labs and imaging studies:  Labs: Labs show the following:   Basic Metabolic Panel: Recent Labs  Lab 08/29/19 1134 08/30/19 0730  NA 140 139  K 5.2* 4.2  CL 103 101  CO2 26 29  GLUCOSE 119* 92  BUN 13 16  CREATININE 2.16* 1.65*  CALCIUM 8.6* 9.1  MG  --  2.4   GFR Estimated Creatinine Clearance: 42.7 mL/min (A) (by C-G formula based on SCr of 1.65 mg/dL (H)). Liver Function Tests: Recent Labs  Lab 08/29/19 1134  AST 32  ALT 12  ALKPHOS 66  BILITOT 0.9  PROT 7.0  ALBUMIN 4.1   No results for input(s): LIPASE, AMYLASE in the last 168 hours. No results for input(s): AMMONIA in the last 168 hours. Coagulation profile No results for input(s): INR, PROTIME in the last 168  hours.  CBC: Recent Labs  Lab 08/29/19 1134 08/30/19 0730  WBC 13.0* 7.8  HGB 12.5 12.3  HCT 41.8 39.0  MCV 100.2* 94.9  PLT 235 174   Cardiac Enzymes: No results for input(s): CKTOTAL, CKMB, CKMBINDEX, TROPONINI in the last 168 hours. BNP (last 3 results) No results for input(s): PROBNP in the last 8760 hours. CBG: No results for input(s): GLUCAP in the last 168 hours. D-Dimer: No results for input(s): DDIMER in the last 72 hours. Hgb A1c: No results for input(s): HGBA1C in the last 72 hours. Lipid Profile: No results for input(s): CHOL, HDL, LDLCALC, TRIG, CHOLHDL, LDLDIRECT in the last 72 hours. Thyroid function studies: No results for input(s): TSH, T4TOTAL, T3FREE, THYROIDAB in the last 72 hours.  Invalid input(s): FREET3 Anemia work up: No results for input(s): VITAMINB12, FOLATE, FERRITIN, TIBC, IRON, RETICCTPCT in the last  72 hours. Sepsis Labs: Recent Labs  Lab 08/29/19 1134 08/30/19 0730  WBC 13.0* 7.8    Microbiology Recent Results (from the past 240 hour(s))  Respiratory Panel by RT PCR (Flu A&B, Covid) - Nasopharyngeal Swab     Status: None   Collection Time: 08/29/19 12:47 PM   Specimen: Nasopharyngeal Swab  Result Value Ref Range Status   SARS Coronavirus 2 by RT PCR NEGATIVE NEGATIVE Final    Comment: (NOTE) SARS-CoV-2 target nucleic acids are NOT DETECTED. The SARS-CoV-2 RNA is generally detectable in upper respiratoy specimens during the acute phase of infection. The lowest concentration of SARS-CoV-2 viral copies this assay can detect is 131 copies/mL. A negative result does not preclude SARS-Cov-2 infection and should not be used as the sole basis for treatment or other patient management decisions. A negative result may occur with  improper specimen collection/handling, submission of specimen other than nasopharyngeal swab, presence of viral mutation(s) within the areas targeted by this assay, and inadequate number of viral copies (<131 copies/mL). A negative result must be combined with clinical observations, patient history, and epidemiological information. The expected result is Negative. Fact Sheet for Patients:  PinkCheek.be Fact Sheet for Healthcare Providers:  GravelBags.it This test is not yet ap proved or cleared by the [place] FDA and  has been authorized for detection and/or diagnosis of SARS-CoV-2 by FDA under an Emergency Use Authorization (EUA). This EUA will remain  in effect (meaning this test can be used) for the duration of the COVID-19 declaration under Section 564(b)(1) of the Act, 21 U.S.C. section 360bbb-3(b)(1), unless the authorization is terminated or revoked sooner.    Influenza A by PCR NEGATIVE NEGATIVE Final   Influenza B by PCR NEGATIVE NEGATIVE Final    Comment: (NOTE) The Xpert Xpress  SARS-CoV-2/FLU/RSV assay is intended as an aid in  the diagnosis of influenza from Nasopharyngeal swab specimens and  should not be used as a sole basis for treatment. Nasal washings and  aspirates are unacceptable for Xpert Xpress SARS-CoV-2/FLU/RSV  testing. Fact Sheet for Patients: PinkCheek.be Fact Sheet for Healthcare Providers: GravelBags.it This test is not yet approved or cleared by the [place] FDA and  has been authorized for detection and/or diagnosis of SARS-CoV-2 by  FDA under an Emergency Use Authorization (EUA). This EUA will remain  in effect (meaning this test can be used) for the duration of the  Covid-19 declaration under Section 564(b)(1) of the Act, 21  U.S.C. section 360bbb-3(b)(1), unless the authorization is  terminated or revoked. Performed at [facility], [address] [place], [place] [address]     Procedures  and diagnostic studies:  US RENAL  Result Date: Sep 02, 2019 CLINICAL DATA:  AKA, history of left nephrectomy. EXAM: RENAL / URINARY TRACT ULTRASOUND COMPLETE COMPARISON:  None. FINDINGS: Right Kidney: Renal measurements: 10.4 x 5.6 x 5.3 cm = volume: 159 mL . Echogenicity within normal limits. No mass or hydronephrosis visualized. Left Kidney: Surgically absent. Bladder: Appears normal for degree of bladder distention. Other: None. IMPRESSION: Normal sonographic appearance of the right kidney. Electronically Signed   By: Audie Pinto M.D.   On: Sep 02, 2019 16:14   DG Chest Portable 1 View  Result Date: 09-02-19 CLINICAL DATA:  Weakness, found unresponsive EXAM: PORTABLE CHEST 1 VIEW COMPARISON:  07/07/2019 chest radiograph. FINDINGS: Anterior and bilateral posterior spinal fusion hardware in the lower cervical spine. Low lung volumes. Stable cardiomediastinal silhouette with normal heart size. No pneumothorax. No pleural effusion. Lungs appear clear, with no acute consolidative  airspace disease and no pulmonary edema. IMPRESSION: Low lung volumes. No active cardiopulmonary disease. Electronically Signed   By: Ilona Sorrel M.D.   On: 2019-09-02 12:04    Medications:   . buPROPion  150 mg Oral BID  . dicyclomine  20 mg Oral TID AC  . escitalopram  20 mg Oral Daily  . heparin  5,000 Units Subcutaneous Q8H  . morphine  60 mg Oral Q12H  . pregabalin  50 mg Oral TID  . sucralfate  1 g Oral BID  . traZODone  100 mg Oral QHS   Continuous Infusions: . lactated ringers       LOS: 0 days   Elenna Spratling  [facility]     08/30/2019, 3:48 PM

## 2019-08-31 ENCOUNTER — Telehealth: Payer: Medicaid Other | Admitting: Student in an Organized Health Care Education/Training Program

## 2019-08-31 DIAGNOSIS — T40601D Poisoning by unspecified narcotics, accidental (unintentional), subsequent encounter: Secondary | ICD-10-CM

## 2019-08-31 LAB — MAGNESIUM: Magnesium: 2.2 mg/dL (ref 1.7–2.4)

## 2019-08-31 LAB — BASIC METABOLIC PANEL
Anion gap: 6 (ref 5–15)
BUN: 16 mg/dL (ref 6–20)
CO2: 30 mmol/L (ref 22–32)
Calcium: 8.7 mg/dL — ABNORMAL LOW (ref 8.9–10.3)
Chloride: 106 mmol/L (ref 98–111)
Creatinine, Ser: 1.32 mg/dL — ABNORMAL HIGH (ref 0.44–1.00)
GFR calc Af Amer: 51 mL/min — ABNORMAL LOW (ref >60)
GFR calc non Af Amer: 44 mL/min — ABNORMAL LOW (ref >60)
Glucose, Bld: 80 mg/dL (ref 70–99)
Potassium: 3.9 mmol/L (ref 3.5–5.1)
Sodium: 142 mmol/L (ref 135–145)

## 2019-08-31 LAB — CBC
HCT: 32.4 % — ABNORMAL LOW (ref 36.0–46.0)
Hemoglobin: 10.5 g/dL — ABNORMAL LOW (ref 12.0–15.0)
MCH: 30.2 pg (ref 26.0–34.0)
MCHC: 32.4 g/dL (ref 30.0–36.0)
MCV: 93.1 fL (ref 80.0–100.0)
Platelets: 159 10*3/uL (ref 150–400)
RBC: 3.48 MIL/uL — ABNORMAL LOW (ref 3.87–5.11)
RDW: 13 % (ref 11.5–15.5)
WBC: 6.8 10*3/uL (ref 4.0–10.5)
nRBC: 0 % (ref 0.0–0.2)

## 2019-08-31 MED ORDER — NALOXONE HCL 4 MG/0.1ML NA LIQD
NASAL | 0 refills | Status: AC
Start: 2019-08-31 — End: ?

## 2019-08-31 NOTE — Discharge Summary (Addendum)
Physician Discharge Summary  Lindsay Austin ENM:[id number] DOB: 1960-08-14 DOA: 08/29/2019  PCP: [name], PA  Admit date: 08/29/2019 Discharge date: 09/01/2019  Discharge disposition: Home   Recommendations for Outpatient Follow-Up:   Outpatient follow-up with PCP 1 week. Monitor kidney function.   Discharge Diagnosis:   Active Problems:   AKI (acute kidney injury) ([place])   Overdose opiate, accidental or unintentional, subsequent encounter    Discharge Condition: Stable.  Diet recommendation: Heart healthy diet  Code status: Full code.    Hospital Course:   Lindsay Austin is a 59 y.o. 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  Reportedly, her oxygen saturation was low in the 40s when she was found unresponsive.  Patient said she had accidentally taken an extra pill of MS Contin.  Reportedly, EMS had given her 2 doses of IV Narcan and 1 dose of intranasal Narcan.  She was admitted to the hospital for accidental opioid overdose and acute hypoxemic respiratory failure.  Work-up in the hospital showed acute kidney injury.  She was treated with IV fluids.  Her creatinine has improved and is close to baseline.  She is deemed stable for discharge to home.  She was given a prescription for intranasal Narcan and she was educated on its use.      Discharge Exam:   Vitals:   08/31/19 1048 08/31/19 1138  BP:  132/80  Pulse:  (!) 55  Resp:  16  Temp:  98.2 F (36.8 C)  SpO2: 94% 96%   Vitals:   08/30/19 2018 08/31/19 0643 08/31/19 1048 08/31/19 1138  BP: (!) 156/81 126/63  132/80  Pulse: (!) 52 (!) 49  (!) 55  Resp: '16 16  16  ' Temp: 98.6 F (37 C) 97.9 F (36.6 C)  98.2 F (36.8 C)  TempSrc: Oral Oral  Oral  SpO2: 100% 98% 94% 96%    Weight:      Height:         GEN: NAD SKIN: No rash EYES: EOMI ENT: MMM CV: RRR PULM: CTA B ABD: soft, ND, NT, +BS CNS: AAO x 3, non focal EXT: No edema or tenderness   The results of significant diagnostics from this hospitalization (including imaging, microbiology, ancillary and laboratory) are listed below for reference.     Procedures and Diagnostic Studies:   US RENAL  Result Date: 08/29/2019 CLINICAL DATA:  AKA, history of left nephrectomy. EXAM: RENAL / URINARY TRACT ULTRASOUND COMPLETE COMPARISON:  None. FINDINGS: Right Kidney: Renal measurements: 10.4 x 5.6 x 5.3 cm = volume: 159 mL . Echogenicity within normal limits. No mass or hydronephrosis visualized. Left Kidney: Surgically absent. Bladder: Appears normal for degree of bladder distention. Other: None. IMPRESSION: Normal sonographic appearance of the right kidney. Electronically Signed   By: Audie Pinto M.D.   On: 08/29/2019 16:14   DG Chest Portable 1 View  Result Date: 08/29/2019 CLINICAL DATA:  Weakness, found unresponsive EXAM: PORTABLE CHEST 1 VIEW COMPARISON:  07/07/2019 chest radiograph. FINDINGS: Anterior and bilateral posterior spinal fusion hardware in the lower cervical spine. Low lung volumes. Stable cardiomediastinal silhouette with normal heart size. No pneumothorax. No pleural effusion. Lungs appear clear, with no acute consolidative airspace disease and no pulmonary edema. IMPRESSION: Low lung volumes. No active cardiopulmonary disease. Electronically Signed   By: Janina Mayo.D.  On: 08/29/2019 12:04     Labs:   Basic Metabolic Panel: Recent Labs  Lab 08/29/19 1134 08/29/19 1134 08/30/19 0730 08/31/19 0552  NA 140  --  139 142  K 5.2*   < > 4.2 3.9  CL 103  --  101 106  CO2 26  --  29 30  GLUCOSE 119*  --  92 80  BUN 13  --  16 16  CREATININE 2.16*  --  1.65* 1.32*  CALCIUM 8.6*  --  9.1 8.7*  MG  --   --  2.4 2.2   < > = values in this interval not displayed.   GFR Estimated  Creatinine Clearance: 53.4 mL/min (A) (by C-G formula based on SCr of 1.32 mg/dL (H)). Liver Function Tests: Recent Labs  Lab 08/29/19 1134  AST 32  ALT 12  ALKPHOS 66  BILITOT 0.9  PROT 7.0  ALBUMIN 4.1   No results for input(s): LIPASE, AMYLASE in the last 168 hours. No results for input(s): AMMONIA in the last 168 hours. Coagulation profile No results for input(s): INR, PROTIME in the last 168 hours.  CBC: Recent Labs  Lab 08/29/19 1134 08/30/19 0730 08/31/19 0552  WBC 13.0* 7.8 6.8  HGB 12.5 12.3 10.5*  HCT 41.8 39.0 32.4*  MCV 100.2* 94.9 93.1  PLT 235 174 159   Cardiac Enzymes: No results for input(s): CKTOTAL, CKMB, CKMBINDEX, TROPONINI in the last 168 hours. BNP: Invalid input(s): POCBNP CBG: No results for input(s): GLUCAP in the last 168 hours. D-Dimer No results for input(s): DDIMER in the last 72 hours. Hgb A1c No results for input(s): HGBA1C in the last 72 hours. Lipid Profile No results for input(s): CHOL, HDL, LDLCALC, TRIG, CHOLHDL, LDLDIRECT in the last 72 hours. Thyroid function studies No results for input(s): TSH, T4TOTAL, T3FREE, THYROIDAB in the last 72 hours.  Invalid input(s): FREET3 Anemia work up No results for input(s): VITAMINB12, FOLATE, FERRITIN, TIBC, IRON, RETICCTPCT in the last 72 hours. Microbiology Recent Results (from the past 240 hour(s))  Respiratory Panel by RT PCR (Flu A&B, Covid) - Nasopharyngeal Swab     Status: None   Collection Time: 08/29/19 12:47 PM   Specimen: Nasopharyngeal Swab  Result Value Ref Range Status   SARS Coronavirus 2 by RT PCR NEGATIVE NEGATIVE Final    Comment: (NOTE) SARS-CoV-2 target nucleic acids are NOT DETECTED. The SARS-CoV-2 RNA is generally detectable in upper respiratoy specimens during the acute phase of infection. The lowest concentration of SARS-CoV-2 viral copies this assay can detect is 131 copies/mL. A negative result does not preclude SARS-Cov-2 infection and should not be used as  the sole basis for treatment or other patient management decisions. A negative result may occur with  improper specimen collection/handling, submission of specimen other than nasopharyngeal swab, presence of viral mutation(s) within the areas targeted by this assay, and inadequate number of viral copies (<131 copies/mL). A negative result must be combined with clinical observations, patient history, and epidemiological information. The expected result is Negative. Fact Sheet for Patients:  PinkCheek.be Fact Sheet for Healthcare Providers:  GravelBags.it This test is not yet ap proved or cleared by the [place] FDA and  has been authorized for detection and/or diagnosis of SARS-CoV-2 by FDA under an Emergency Use Authorization (EUA). This EUA will remain  in effect (meaning this test can be used) for the duration of the COVID-19 declaration under Section 564(b)(1) of the Act, 21 U.S.C. section 360bbb-3(b)(1), unless the authorization is terminated or  revoked sooner.    Influenza A by PCR NEGATIVE NEGATIVE Final   Influenza B by PCR NEGATIVE NEGATIVE Final    Comment: (NOTE) The Xpert Xpress SARS-CoV-2/FLU/RSV assay is intended as an aid in  the diagnosis of influenza from Nasopharyngeal swab specimens and  should not be used as a sole basis for treatment. Nasal washings and  aspirates are unacceptable for Xpert Xpress SARS-CoV-2/FLU/RSV  testing. Fact Sheet for Patients: PinkCheek.be Fact Sheet for Healthcare Providers: GravelBags.it This test is not yet approved or cleared by the [place] FDA and  has been authorized for detection and/or diagnosis of SARS-CoV-2 by  FDA under an Emergency Use Authorization (EUA). This EUA will remain  in effect (meaning this test can be used) for the duration of the  Covid-19 declaration under Section 564(b)(1) of the Act, 21    U.S.C. section 360bbb-3(b)(1), unless the authorization is  terminated or revoked. Performed at [facility], [address], [place], [place] [address]      Discharge Instructions:   Discharge Instructions    Diet - low sodium heart healthy   Complete by: As directed    Discharge instructions   Complete by: As directed    Repeat kidney function test with PCP in 1 week   Increase activity slowly   Complete by: As directed      Allergies as of 08/31/2019      Reactions   Methadone Palpitations   Other Swelling, Other (See Comments)   Tricyclic antidepressants Unable to void Tongue Swelling    Tape Other (See Comments)   Blisters  Paper tape ok Tegaderm ok       Medication List    TAKE these medications   acetaminophen 500 MG tablet Commonly known as: TYLENOL Take 2 tablets (1,000 mg total) by mouth 2 (two) times daily as needed. Notes to patient: As needed   albuterol 108 (90 Base) MCG/ACT inhaler Commonly known as: VENTOLIN HFA Inhale 2 puffs into the lungs every 4 (four) hours as needed for wheezing or shortness of breath. Notes to patient: As needed   CALCIUM-MAGNESIUM-ZINC-D3 PO Take by mouth daily. Notes to patient: Morning 09/01/19   dicyclomine 20 MG tablet Commonly known as: BENTYL Take 20 mg by mouth 3 (three) times daily before meals. Notes to patient: Before evening meal 08/31/19   Emgality 120 MG/ML Soaj Generic drug: Galcanezumab-gnlm Inject 120 mg into the skin every 28 (twenty-eight) days. Notes to patient: According to home schedule   escitalopram 20 MG tablet Commonly known as: LEXAPRO Take 20 mg by mouth daily. with food Notes to patient: Morning 09/01/19   lidocaine 4 % external solution Commonly known as: XYLOCAINE Apply topically as needed. Notes to patient: As needed   lidocaine 5 % Commonly known as: LIDODERM Place 2 patches onto the skin daily. Remove & Discard patch within 12 hours or as directed by MD Notes to  patient: Morning 09/01/19   methocarbamol 500 MG tablet Commonly known as: Robaxin Take 1 tablet (500 mg total) by mouth every 8 (eight) hours as needed for muscle spasms. Notes to patient: As needed   morphine 60 MG 12 hr tablet Commonly known as: MS CONTIN SMARTSIG:1 Tablet(s) By Mouth Every 12 Hours Notes to patient: Before bed 08/31/19   naloxone 4 MG/0.1ML Liqd nasal spray kit Commonly known as: NARCAN 1 actuation in one nostril x  1 dose. May repeat dose every 2 to 3 minutes until patient is responsive or EMS arrives Notes to  patient: Only if needed   ondansetron 4 MG disintegrating tablet Commonly known as: ZOFRAN-ODT Take 4 mg by mouth every 8 (eight) hours as needed for nausea or vomiting. Notes to patient: As needed   pregabalin 50 MG capsule Commonly known as: LYRICA Take 50 mg by mouth 3 (three) times daily. Notes to patient: Afternoon 08/31/19   rizatriptan 10 MG tablet Commonly known as: MAXALT Take 10 mg by mouth as needed for migraine. Notes to patient: As needed   sucralfate 1 g tablet Commonly known as: CARAFATE Take 1 g by mouth 2 (two) times daily. Notes to patient: Before bed 08/31/19   traMADol 50 MG tablet Commonly known as: Ultram Take 1 tablet (50 mg total) by mouth every 8 (eight) hours as needed for moderate pain. Notes to patient: As needed   traZODone 50 MG tablet Commonly known as: DESYREL Take 100 mg by mouth at bedtime. Notes to patient: Before bed 08/31/19   triamcinolone cream 0.1 % Commonly known as: KENALOG Apply 1 application topically 2 (two) times daily. Notes to patient: According to home schedule   Wellbutrin SR 150 MG 12 hr tablet Generic drug: buPROPion Take 150 mg by mouth 2 (two) times daily. Notes to patient: Before bed 08/31/19      Follow-up Information    [name], [place]. Go on 09/07/2019.   Specialty: Family Medicine Why: 10am appointment Contact information: [address] [place]  [address] [phone]            Time coordinating discharge: 28 minutes Signed:  Jennye Boroughs  [facility] 09/01/2019, 10:33 AM

## 2019-09-01 ENCOUNTER — Encounter: Payer: Self-pay | Admitting: Student in an Organized Health Care Education/Training Program

## 2019-09-01 DIAGNOSIS — T40601D Poisoning by unspecified narcotics, accidental (unintentional), subsequent encounter: Secondary | ICD-10-CM

## 2019-09-02 ENCOUNTER — Encounter: Payer: Self-pay | Admitting: Student in an Organized Health Care Education/Training Program

## 2019-09-02 ENCOUNTER — Other Ambulatory Visit: Payer: Self-pay

## 2019-09-02 ENCOUNTER — Ambulatory Visit
Payer: Medicaid Other | Attending: Student in an Organized Health Care Education/Training Program | Admitting: Student in an Organized Health Care Education/Training Program

## 2019-09-02 DIAGNOSIS — M47816 Spondylosis without myelopathy or radiculopathy, lumbar region: Secondary | ICD-10-CM

## 2019-09-02 DIAGNOSIS — Q761 Klippel-Feil syndrome: Secondary | ICD-10-CM | POA: Diagnosis not present

## 2019-09-02 DIAGNOSIS — Z981 Arthrodesis status: Secondary | ICD-10-CM | POA: Diagnosis not present

## 2019-09-02 DIAGNOSIS — M47812 Spondylosis without myelopathy or radiculopathy, cervical region: Secondary | ICD-10-CM

## 2019-09-02 DIAGNOSIS — G894 Chronic pain syndrome: Secondary | ICD-10-CM | POA: Diagnosis not present

## 2019-09-02 DIAGNOSIS — G9050 Complex regional pain syndrome I, unspecified: Secondary | ICD-10-CM

## 2019-09-02 DIAGNOSIS — M4802 Spinal stenosis, cervical region: Secondary | ICD-10-CM

## 2019-09-02 MED ORDER — MORPHINE SULFATE ER 60 MG PO TBCR: 60.0000 mg | EXTENDED_RELEASE_TABLET | Freq: Two times a day (BID) | ORAL | 0 refills | Status: DC

## 2019-09-02 MED ORDER — MORPHINE SULFATE ER 60 MG PO TBCR: 60.0000 mg | EXTENDED_RELEASE_TABLET | Freq: Two times a day (BID) | ORAL | 0 refills | Status: AC

## 2019-09-02 NOTE — Progress Notes (Signed)
Patient: Lindsay Austin  Service Category: E/M  Provider: [name], MD  DOB: 1960-12-01  DOS: 09/02/2019  Location: Office  MRN: [id number]  Setting: Ambulatory outpatient  Referring Provider: [name], PA  Type: Established Patient  Specialty: Interventional Pain Management  PCP: [name], PA  Location: Home  Delivery: TeleHealth     Virtual Encounter - Pain Management PROVIDER NOTE: Information contained herein reflects review and annotations entered in association with encounter. Interpretation of such information and data should be left to medically-trained personnel. Information provided to patient can be located elsewhere in the medical record under "Patient Instructions". Document created using STT-dictation technology, any transcriptional errors that may result from process are unintentional.    Contact & Pharmacy Preferred: [phone] Home: [phone] (home) Mobile: [phone] (mobile) E-mail: [email or web address]  [name] [address]Phone: [phone]: [phone]  Pre-screening  Lindsay Austin "in-person" vs "virtual" encounter. She indicated preferring virtual for this encounter.   Reason COVID-19*  Social distancing based on CDC and AMA recommendations.   I contacted Lindsay Austin 09/02/2019 via televisit.      I clearly identified myself as BGillis Santa MD. I verified that I was speaking with the correct person using two identifiers (Name: Lindsay Austin and date of birth: [birth year]. (Name: Lindsay Austin and date of birth: [birth year].  Consent I sought verbal advanced consent from Lindsay Austin virtual visit interactions. I informed Lindsay Austin possible security and privacy concerns, risks, and limitations associated with providing "not-in-person" medical evaluation and management services. I also informed Lindsay. KDodgenof the availability of "in-person" appointments. Finally, I informed her that there would be a charge for the  virtual visit and that she could be  personally, fully or partially, [name]   Historic Elements   Lindsay. JMyleah Cavendishis a 59y.o. year old, female patient evaluated today after her last contact with our practice on 08/30/2019. Lindsay Austin has a past medical history of Anxiety, Arthritis, Bladder cancer ([place], Cancer of kidney ([place], DDD (degenerative disc disease), cervical, Depression, Failed back syndrome, Fibromyalgia, GERD (gastroesophageal reflux disease), Headache, Heart murmur, Hyperlipemia, Hypertension, IBS (irritable bowel syndrome), Kidney failure, Renal insufficiency, RSD (reflex sympathetic dystrophy), Seizure ([place], Stomach ulcer, and Trigeminal neuralgia. 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 Lindsay Austin a current medication list which includes the following prescription(s): acetaminophen, albuterol, bupropion, dicyclomine, escitalopram, emgality, lidocaine, methocarbamol, multiple minerals-vitamins, naloxone, ondansetron, pregabalin, rizatriptan, sucralfate, tramadol, trazodone, triamcinolone cream, lidocaine, lidocaine, [START ON 09/15/2019] morphine, [START ON 10/15/2019] morphine, and [START ON 11/14/2019] morphine. She  reports that she has never smoked. She has never used smokeless tobacco. She reports that she does not drink alcohol or use drugs. Lindsay Austin allergic to methadone; other; and tape.   HPI  Today, she is being contacted for medication management.   S/p C5-T2 posterior decompression and fusion  and C6/7 laminectomy with Dr CLacinda Axonon 07/05/19 for cervical myelopathy and pseudoarthrosis.  Of note, patient did have an ED admission on 5/2 for an accidental overdose on her Lindsay Austin.  She ended up taking an extra tablet per pts daugther and was found unresponsive.  She received Narcan upon arrival to the emergency department which improved her arousal state. Patient states that she did not take an extra Lindsay Austin when she came back home and checked her pill box.  She states that she is doing better since her ED discharge.  It was noted that she did have acute kidney injury at the time of the discharge with her creatinine being 2.16.  I suspect that she had a buildup of morphine related metabolites that can cause central nervous system depression, confusion, drowsiness and that is likely what happened.  With an improvement in her kidney function, I suspect the risk of this to markedly decreased.  If patient does have chronic kidney disease with elevated creatinine, I discussed transitioning to a fentanyl patch for long-acting analgesic benefit which is not dependent upon renal metabolism.  Patient endorsed understanding.  She states that her primary care provider has called in Narcan to her pharmacy which she is picking up today.  I encouraged her to always have this with her.  We also encouraged spacing out her medications so that she is not taking her muscle relaxer, her Lindsay Austin, her Lyrica all at once.  Patient endorsed understanding.   Pharmacotherapy Assessment  Analgesic: Lindsay Austin 60 mg BID (pt transferred care from PA, was previously on 200 mg daily)}   Monitoring: [place] PMP: PDMP reviewed during this encounter.       Pharmacotherapy: SEE ABOVE Compliance: No problems identified. Effectiveness: Clinically acceptable. Plan: Refer to "POC".  UDS:  Summary  Date Value Ref Range Status  12/28/2018 Note  Final    Comment:     ==================================================================== [employer] Select 13 (MW) ==================================================================== Test                             Result       Flag       Units Drug Present and Declared for Prescription Verification   Morphine                       >6757        EXPECTED   ng/mg creat   Normorphine                    1834         EXPECTED   ng/mg creat    Potential sources of large amounts of morphine in the absence of    codeine include administration of morphine or use of heroin.    Normorphine is an expected metabolite of morphine.   Hydromorphone                  401          EXPECTED   ng/mg creat    Hydromorphone may be present as a metabolite of morphine;    concentrations of hydromorphone rarely exceed 5% of the morphine    concentration when this is the source of hydromorphone. ==================================================================== Test                      Result    Flag   Units      Ref Range   Creatinine              148              mg/dL      >=20 ==================================================================== Declared Medications:  The flagging and  interpretation on this report are based on the  following declared medications.  Unexpected results may arise from  inaccuracies in the declared medications.  **Note: The testing scope of this panel includes these medications:  Morphine  **Note: The testing scope of this panel does not include the  following reported medications:  Bupropion  Dicyclomine  Escitalopram  Meclizine  Methocarbamol  Nortriptyline  Ondansetron  Pregabalin  Sucralfate  Trazodone  Triamcinolone  Trospium ==================================================================== For clinical consultation, please call [phone]. ====================================================================    Laboratory Chemistry Profile   Renal Lab Results   Component Value Date   BUN 16 08/31/2019   CREATININE 1.32 (H) 08/31/2019   LABCREA 71 08/29/2019   GFRAA 51 (L) 08/31/2019   GFRNONAA 44 (L) 08/31/2019     Hepatic Lab Results  Component Value Date   AST 32 08/29/2019   ALT 12 08/29/2019   ALBUMIN 4.1 08/29/2019   ALKPHOS 66 08/29/2019     Electrolytes Lab Results  Component Value Date   NA 142 08/31/2019   K 3.9 08/31/2019   CL 106 08/31/2019   CALCIUM 8.7 (L) 08/31/2019   MG 2.2 08/31/2019     Bone No results found for: VD25OH, VD125OH2TOT, WO0321YY4, MG5003BC4, 25OHVITD1, 25OHVITD2, 25OHVITD3, TESTOFREE, TESTOSTERONE   Inflammation (CRP: Acute Phase) (ESR: Chronic Phase) No results found for: CRP, ESRSEDRATE, LATICACIDVEN     Note: Above Lab results reviewed.  Imaging  US RENAL CLINICAL DATA:  AKA, history of left nephrectomy.  EXAM: RENAL / URINARY TRACT ULTRASOUND COMPLETE  COMPARISON:  None.  FINDINGS: Right Kidney:  Renal measurements: 10.4 x 5.6 x 5.3 cm = volume: 159 mL . Echogenicity within normal limits. No mass or hydronephrosis visualized.  Left Kidney:  Surgically absent.  Bladder:  Appears normal for degree of bladder distention.  Other:  None.  IMPRESSION: Normal sonographic appearance of the right kidney.  Electronically Signed   By: Audie Pinto M.D.   On: 08/29/2019 16:14 DG Chest Portable 1 View CLINICAL DATA:  Weakness, found unresponsive  EXAM: PORTABLE CHEST 1 VIEW  COMPARISON:  07/07/2019 chest radiograph.  FINDINGS: Anterior and bilateral posterior spinal fusion hardware in the lower cervical spine. Low lung volumes. Stable cardiomediastinal silhouette with normal heart size. No pneumothorax. No pleural effusion. Lungs appear clear, with no acute consolidative airspace disease and no pulmonary edema.  IMPRESSION: Low lung volumes. No active cardiopulmonary disease.  Electronically Signed   By: Ilona Sorrel M.D.   On: 08/29/2019 12:04  Assessment   The primary encounter diagnosis was Cervical fusion syndrome (C4-T2). Diagnoses of Cervical spondylosis without myelopathy, Chronic pain syndrome, History of lumbar fusion, Lumbar spondylosis, Spinal stenosis in cervical region, and RSD (reflex sympathetic dystrophy) were also pertinent to this visit.  Plan of Care  Lindsay Austin has a current medication list which includes the following long-term medication(s): albuterol, bupropion, dicyclomine, escitalopram, rizatriptan, sucralfate, and trazodone.  Continue morphine as prescribed.  Patient likely had accumulation of morphine 6 glucuronide which is a major active metabolite of morphine in the context of reduced kidney function.  This is likely what caused her to become unresponsive as M6G can accumulate to toxic levels in patients with acute kidney impairment and can lead to toxicity such as confusion, sedation, distorted cognition and respiratory depression.  Patient's creatinine upon ED admission was 2.16 explaining the phenomenon of her unresponsiveness in the context of delayed morphine metabolism with her acute kidney injury.  Her kidney function is improving.  She is prescribed Narcan  which I instructed her to have nearby.  I will refill her morphine as below.  Patient is status post C4-T2 cervical fusion and decompression for cervical myelopathy.  She is recovering from this but states that it is painful.  Otherwise she can continue her other medications as prescribed.  She will follow-up with me in 3 months.  Patient endorsed understanding.  Pharmacotherapy (Medications Ordered): Meds ordered this encounter  Medications  . morphine (Lindsay Austin) 60 MG 12 hr tablet    Sig: Take 1 tablet (60 mg total) by mouth every 12 (twelve) hours.    Dispense:  60 tablet    Refill:  0    FOR CHRONIC PAIN SYNDROME  . morphine (Lindsay Austin) 60 MG 12 hr tablet    Sig: Take 1 tablet (60 mg total) by mouth every 12 (twelve) hours.    Dispense:  60 tablet     Refill:  0    FOR CHRONIC PAIN SYNDROME  . morphine (Lindsay Austin) 60 MG 12 hr tablet    Sig: Take 1 tablet (60 mg total) by mouth every 12 (twelve) hours.    Dispense:  60 tablet    Refill:  0    FOR CHRONIC PAIN SYNDROME   Follow-up plan:   Return in about 3 months (around 12/03/2019) for Medication Management, in person.    Recent Visits No visits were found meeting these conditions.  Showing recent visits within past 90 days and meeting all other requirements   Today's Visits Date Type Provider Dept  09/02/19 Telemedicine [name], MD [facility]  Showing today's visits and meeting all other requirements   Future Appointments No visits were found meeting these conditions.  Showing future appointments within next 90 days and meeting all other requirements   I discussed the assessment and treatment plan with the patient. The patient was provided an opportunity to ask questions and all were answered. The patient agreed with the plan and demonstrated an understanding of the instructions.  Patient advised to call back or seek an in-person evaluation if the symptoms or condition worsens.  Duration of encounter: 30 minutes.  Note by: [name], MD Date: 09/02/2019; Time: 11:44 AM

## 2019-11-25 ENCOUNTER — Ambulatory Visit
Payer: Medicaid Other | Attending: Student in an Organized Health Care Education/Training Program | Admitting: Student in an Organized Health Care Education/Training Program

## 2019-11-25 ENCOUNTER — Other Ambulatory Visit: Payer: Self-pay

## 2019-11-25 ENCOUNTER — Encounter: Payer: Self-pay | Admitting: Student in an Organized Health Care Education/Training Program

## 2019-11-25 VITALS — HR 73 | Temp 97.2°F | Resp 16 | Ht 64.0 in | Wt 186.0 lb

## 2019-11-25 DIAGNOSIS — Q761 Klippel-Feil syndrome: Secondary | ICD-10-CM | POA: Insufficient documentation

## 2019-11-25 DIAGNOSIS — G894 Chronic pain syndrome: Secondary | ICD-10-CM | POA: Diagnosis present

## 2019-11-25 DIAGNOSIS — M47812 Spondylosis without myelopathy or radiculopathy, cervical region: Secondary | ICD-10-CM | POA: Diagnosis present

## 2019-11-25 DIAGNOSIS — M4802 Spinal stenosis, cervical region: Secondary | ICD-10-CM | POA: Diagnosis present

## 2019-11-25 DIAGNOSIS — Z981 Arthrodesis status: Secondary | ICD-10-CM | POA: Insufficient documentation

## 2019-11-25 DIAGNOSIS — T85192S Other mechanical complication of implanted electronic neurostimulator (electrode) of spinal cord, sequela: Secondary | ICD-10-CM | POA: Insufficient documentation

## 2019-11-25 DIAGNOSIS — G9050 Complex regional pain syndrome I, unspecified: Secondary | ICD-10-CM | POA: Diagnosis present

## 2019-11-25 DIAGNOSIS — M47816 Spondylosis without myelopathy or radiculopathy, lumbar region: Secondary | ICD-10-CM | POA: Insufficient documentation

## 2019-11-25 MED ORDER — MORPHINE SULFATE ER 60 MG PO TBCR: 60.0000 mg | EXTENDED_RELEASE_TABLET | Freq: Two times a day (BID) | ORAL | 0 refills | Status: DC

## 2019-11-25 MED ORDER — MORPHINE SULFATE ER 60 MG PO TBCR: 60.0000 mg | EXTENDED_RELEASE_TABLET | Freq: Two times a day (BID) | ORAL | 0 refills | Status: AC

## 2019-11-25 NOTE — Progress Notes (Signed)
Nursing Pain Medication Assessment:  Safety precautions to be maintained throughout the outpatient stay will include: orient to surroundings, keep bed in low position, maintain call bell within reach at all times, provide assistance with transfer out of bed and ambulation.  Medication Inspection Compliance: Ms. Skaff did not comply with our request to bring her pills to be counted. She was reminded that bringing the medication bottles, even when empty, is a requirement.  Medication: None brought in. Pill/Patch Count: None available to be counted. Bottle Appearance: No container available. Did not bring bottle(s) to appointment. Filled Date: N/A Last Medication intake:  Today

## 2019-11-25 NOTE — Patient Instructions (Signed)
Two prescriptions for Morphine have been sent to your pharmacy.

## 2019-11-25 NOTE — Progress Notes (Signed)
PROVIDER NOTE: Information contained herein reflects review and annotations entered in association with encounter. Interpretation of such information and data should be left to medically-trained personnel. Information provided to patient can be located elsewhere in the medical record under "Patient Instructions". Document created using STT-dictation technology, any transcriptional errors that may result from process are unintentional.    Patient: Lindsay Austin  Service Category: E/M  Provider: [name], MD  DOB: May 17, 1960  DOS: 11/25/2019  Specialty: Interventional Pain Management  MRN: [id number]  Setting: Ambulatory outpatient  PCP: [name], PA  Type: Established Patient    Referring Provider: [name], PA  Location: Office  Delivery: Face-to-face     HPI  Reason for encounter: [name], a 59 y.o. year old female, is here today for evaluation and management of her Cervical fusion syndrome [Q76.1]. Lindsay Austin primary complain today is Neck Pain, Shoulder Pain, and Back Pain Last encounter: Practice (08/30/2019). My last encounter with her was on 06/03/2019. Pertinent problems: Lindsay Austin has Hx of lumbosacral spine surgery; History of lumbar fusion (L5-S1 fusion); Failed spinal cord stimulator, sequela; Cervical fusion syndrome (C4-C7); Chronic pain syndrome; Major depressive disorder with current active episode; Lumbar spondylosis; Cervical spondylosis without myelopathy; and Spinal stenosis in cervical region on their pertinent problem list. Pain Assessment: Severity of Chronic pain is reported as a 3 /10. Location: Neck  /radiating from neck to shoulders. Onset: More than a month ago. Quality: Stabbing, Aching, Burning. Timing: Constant. Modifying factor(s): rest. Vitals:  height is '5\' 4"'  (1.626 m) and weight is 186 lb (84.4 kg). Her temperature is 97.2 F (36.2 C) (abnormal). Her pulse is 73. Her respiration is 16 and oxygen saturation is 95%.   No change in medical history since  last visit.  Patient's pain is at baseline.  Patient continues multimodal pain regimen as prescribed.  States that it provides pain relief and improvement in functional status. Patient states that she has Narcan at home Patient has 1 prescription of MS Contin at her pharmacy that she can fill.  Patient states that she is still recovering from her C5-T2 posterior spinal fusion that was done on 07/05/2019. She states that the MS Contin helps reduce her pain and also helps improve her functional status.  Pharmacotherapy Assessment   Analgesic: MS Contin 60 mg BID (pt transferred care from [place], was previously on 200 mg daily)}   Monitoring: [place] PMP: PDMP reviewed during this encounter.       Pharmacotherapy: No side-effects or adverse reactions reported. Compliance: No problems identified. Effectiveness: Clinically acceptable.  Dewayne Shorter, RN  11/25/2019  2:21 PM  Sign when Signing Visit Nursing Pain Medication Assessment:  Safety precautions to be maintained throughout the outpatient stay will include: orient to surroundings, keep bed in low position, maintain call bell within reach at all times, provide assistance with transfer out of bed and ambulation.  Medication Inspection Compliance: Lindsay Austin did not comply with our request to bring her pills to be counted. She was reminded that bringing the medication bottles, even when empty, is a requirement.  Medication: None brought in. Pill/Patch Count: None available to be counted. Bottle Appearance: No container available. Did not bring bottle(s) to appointment. Filled Date: N/A Last Medication intake:  Today    UDS:  Summary  Date Value Ref Range Status  12/28/2018 Note  Final    Comment:    ==================================================================== [employer] Select 13 (MW) ==================================================================== Test  Result       Flag       Units Drug Present and Declared  for Prescription Verification   Morphine                       >6757        EXPECTED   ng/mg creat   Normorphine                    1834         EXPECTED   ng/mg creat    Potential sources of large amounts of morphine in the absence of    codeine include administration of morphine or use of heroin.    Normorphine is an expected metabolite of morphine.   Hydromorphone                  401          EXPECTED   ng/mg creat    Hydromorphone may be present as a metabolite of morphine;    concentrations of hydromorphone rarely exceed 5% of the morphine    concentration when this is the source of hydromorphone. ==================================================================== Test                      Result    Flag   Units      Ref Range   Creatinine              148              mg/dL      >=20 ==================================================================== Declared Medications:  The flagging and interpretation on this report are based on the  following declared medications.  Unexpected results may arise from  inaccuracies in the declared medications.  **Note: The testing scope of this panel includes these medications:  Morphine  **Note: The testing scope of this panel does not include the  following reported medications:  Bupropion  Dicyclomine  Escitalopram  Meclizine  Methocarbamol  Nortriptyline  Ondansetron  Pregabalin  Sucralfate  Trazodone  Triamcinolone  Trospium ==================================================================== For clinical consultation, please call [phone]. ====================================================================      ROS  Constitutional: Denies any fever or chills Gastrointestinal: No reported hemesis, hematochezia, vomiting, or acute GI distress Musculoskeletal: Denies any acute onset joint swelling, redness, loss of ROM, or weakness Neurological: No reported episodes of acute onset apraxia, aphasia, dysarthria, agnosia,  amnesia, paralysis, loss of coordination, or loss of consciousness  Medication Review  Galcanezumab-gnlm, Multiple Minerals-Vitamins, acetaminophen, albuterol, buPROPion, dicyclomine, escitalopram, methocarbamol, morphine, naloxone, ondansetron, pregabalin, rizatriptan, sucralfate, traZODone, and triamcinolone cream  History Review  Allergy: Ms. Rauth is allergic to methadone, other, and tape. Drug: Ms. Sottile  reports no history of drug use. Alcohol:  reports no history of alcohol use. Tobacco:  reports that she has never smoked. She has never used smokeless tobacco. Social: Ms. Drenning  reports that she has never smoked. She has never used smokeless tobacco. She reports that she does not drink alcohol and does not use drugs. Medical:  has a past medical history of Anxiety, Arthritis, Bladder cancer ([place]), Cancer of kidney ([place]), DDD (degenerative disc disease), cervical, Depression, Failed back syndrome, Fibromyalgia, GERD (gastroesophageal reflux disease), Headache, Heart murmur, Hyperlipemia, Hypertension, IBS (irritable bowel syndrome), Kidney failure, Renal insufficiency, RSD (reflex sympathetic dystrophy), Seizure ([place]), Stomach ulcer, and Trigeminal neuralgia. Surgical: Ms. Lippens  has a past surgical history that includes Transurethral resection of bladder tumor with gyrus (turbt-gyrus) (  2012, 2013); Nephroureterectomy (2011); Back surgery (1995); Cholecystectomy; Brain surgery (2004); trigeminal neuralgia (2004); Spinal cord stimulator implant (2004); colonscopy (2006); Posterior cervical fusion/foraminotomy (Bilateral, 07/05/2019); Posterior cervical laminectomy (Bilateral, 07/05/2019); and Breast biopsy (Left, 2017). Family: family history includes Dementia in her father; Diabetes in her mother; Heart disease in her mother; Kidney disease in her father.  Laboratory Chemistry Profile   Renal Lab Results  Component Value Date   BUN 16 08/31/2019   CREATININE 1.32 (H) 08/31/2019   LABCREA 71  08/29/2019   GFRAA 51 (L) 08/31/2019   GFRNONAA 44 (L) 08/31/2019     Hepatic Lab Results  Component Value Date   AST 32 08/29/2019   ALT 12 08/29/2019   ALBUMIN 4.1 08/29/2019   ALKPHOS 66 08/29/2019     Electrolytes Lab Results  Component Value Date   NA 142 08/31/2019   K 3.9 08/31/2019   CL 106 08/31/2019   CALCIUM 8.7 (L) 08/31/2019   MG 2.2 08/31/2019     Bone No results found for: VD25OH, VD125OH2TOT, ZG0174BS4, HQ7591MB8, 25OHVITD1, 25OHVITD2, 25OHVITD3, TESTOFREE, TESTOSTERONE   Inflammation (CRP: Acute Phase) (ESR: Chronic Phase) No results found for: CRP, ESRSEDRATE, LATICACIDVEN     Note: Above Lab results reviewed.  Recent Imaging Review  US RENAL CLINICAL DATA:  AKA, history of left nephrectomy.  EXAM: RENAL / URINARY TRACT ULTRASOUND COMPLETE  COMPARISON:  None.  FINDINGS: Right Kidney:  Renal measurements: 10.4 x 5.6 x 5.3 cm = volume: 159 mL . Echogenicity within normal limits. No mass or hydronephrosis visualized.  Left Kidney:  Surgically absent.  Bladder:  Appears normal for degree of bladder distention.  Other:  None.  IMPRESSION: Normal sonographic appearance of the right kidney.  Electronically Signed   By: Audie Pinto M.D.   On: 08/29/2019 16:14 DG Chest Portable 1 View CLINICAL DATA:  Weakness, found unresponsive  EXAM: PORTABLE CHEST 1 VIEW  COMPARISON:  07/07/2019 chest radiograph.  FINDINGS: Anterior and bilateral posterior spinal fusion hardware in the lower cervical spine. Low lung volumes. Stable cardiomediastinal silhouette with normal heart size. No pneumothorax. No pleural effusion. Lungs appear clear, with no acute consolidative airspace disease and no pulmonary edema.  IMPRESSION: Low lung volumes. No active cardiopulmonary disease.  Electronically Signed   By: Ilona Sorrel M.D.   On: 08/29/2019 12:04 Note: Reviewed        Physical Exam  General appearance: Well nourished, well developed,  and well hydrated. In no apparent acute distress Mental status: Alert, oriented x 3 (person, place, & time)       Respiratory: No evidence of acute respiratory distress Eyes: PERLA Vitals: Pulse 73    Temp (!) 97.2 F (36.2 C)    Resp 16    Ht '5\' 4"'  (1.626 m)    Wt 186 lb (84.4 kg)    SpO2 95%    BMI 31.93 kg/m  BMI: Estimated body mass index is 31.93 kg/m as calculated from the following:   Height as of this encounter: '5\' 4"'  (1.626 m).   Weight as of this encounter: 186 lb (84.4 kg). Ideal: Ideal body weight: 54.7 kg (120 lb 9.5 oz) Adjusted ideal body weight: 66.6 kg (146 lb 12.1 oz)   Cervical Spine Area Exam  Skin & Axial Inspection: Well healed scar from previous spine surgery detected Alignment: Asymmetric Functional ROM: Pain restricted ROM, bilaterally Stability: No instability detected Muscle Tone/Strength: Functionally intact. No obvious neuro-muscular anomalies detected. Sensory (Neurological): Neurogenic pain pattern Palpation: No palpable anomalies  Lumbar Spine Area Exam  Skin & Axial Inspection: Well healed scar from previous spine surgery detected Alignment: Levoscoliosis Functional ROM: Pain restricted ROM       Stability: No instability detected Muscle Tone/Strength: Functionally intact. No obvious neuro-muscular anomalies detected. Sensory (Neurological): Dermatomal pain pattern Palpation: Complains of area being tender to palpation        Gait & Posture Assessment  Ambulation: Patient came in today in a wheel chair Gait: Limited. Using assistive device to ambulate Posture: Difficulty standing up straight, due to pain  Lower Extremity Exam    Side: Right lower extremity  Side: Left lower extremity  Stability: No instability observed          Stability: No instability observed          Skin & Extremity Inspection: Skin color, temperature, and hair growth are WNL. No peripheral edema or cyanosis. No masses, redness, swelling, asymmetry, or  associated skin lesions. No contractures.  Skin & Extremity Inspection: Skin color, temperature, and hair growth are WNL. No peripheral edema or cyanosis. No masses, redness, swelling, asymmetry, or associated skin lesions. No contractures.  Functional ROM: Pain restricted ROM for all joints of the lower extremity          Functional ROM: Pain restricted ROM for all joints of the lower extremity          Muscle Tone/Strength: Functionally intact. No obvious neuro-muscular anomalies detected.  Muscle Tone/Strength: Functionally intact. No obvious neuro-muscular anomalies detected.  Sensory (Neurological): Unimpaired        Sensory (Neurological): Unimpaired        DTR: Patellar: deferred today Achilles: deferred today Plantar: deferred today  DTR: Patellar: deferred today Achilles: deferred today Plantar: deferred today  Palpation: No palpable anomalies  Palpation: No palpable anomalies    Assessment   Status Diagnosis  Controlled Controlled Controlled 1. Cervical fusion syndrome (C4-T2)   2. Chronic pain syndrome   3. RSD (reflex sympathetic dystrophy)   4. History of lumbar fusion   5. Cervical spondylosis without myelopathy   6. Lumbar spondylosis   7. Spinal stenosis in cervical region   8. Failed spinal cord stimulator, sequela      Updated Problems: Problem  Spinal Stenosis in Cervical Region  Cervical Spondylosis Without Myelopathy  Major Depressive Disorder With Current Active Episode  Lumbar Spondylosis  Hx of Lumbosacral Spine Surgery  History of lumbar fusion (L5-S1 fusion)  Failed Spinal Cord Stimulator, Sequela  Cervical fusion syndrome (C4-C7)  Chronic Pain Syndrome    Plan of Care   Ms. Briunna Leicht has a current medication list which includes the following long-term medication(s): albuterol, bupropion, dicyclomine, escitalopram, rizatriptan, sucralfate, and trazodone.  Pharmacotherapy (Medications Ordered): Meds ordered this encounter  Medications    morphine (MS CONTIN) 60 MG 12 hr tablet    Sig: Take 1 tablet (60 mg total) by mouth every 12 (twelve) hours.    Dispense:  60 tablet    Refill:  0    FOR CHRONIC PAIN SYNDROME   morphine (MS CONTIN) 60 MG 12 hr tablet    Sig: Take 1 tablet (60 mg total) by mouth every 12 (twelve) hours.    Dispense:  60 tablet    Refill:  0    FOR CHRONIC PAIN SYNDROME   Orders:  Orders Placed This Encounter  Procedures   Tox[employer]SU[employer] Select 13 (MW), Urine    Volume: 30 ml(s). Minimum 3 ml of urine is needed. Document temperature of fresh sample. Indications: Long  term (current) use of opiate analgesic [phone])    Order Specific Question:   Release to patient    Answer:   Immediate   Follow-up plan:   Return in about 12 weeks (around 02/17/2020) for Medication Management, in person.   Recent Visits Date Type Provider Dept  09/02/19 Telemedicine [name], MD [facility]  Showing recent visits within past 90 days and meeting all other requirements Today's Visits Date Type Provider Dept  11/25/19 Office Visit [name], MD [facility]  Showing today's visits and meeting all other requirements Future Appointments No visits were found meeting these conditions. Showing future appointments within next 90 days and meeting all other requirements  I discussed the assessment and treatment plan with the patient. The patient was provided an opportunity to ask questions and all were answered. The patient agreed with the plan and demonstrated an understanding of the instructions.  Patient advised to call back or seek an in-person evaluation if the symptoms or condition worsens.  Duration of encounter: 30 minutes.  Note by: [name], MD Date: 11/25/2019; Time: 2:45 PM

## 2019-11-26 ENCOUNTER — Other Ambulatory Visit: Payer: Self-pay | Admitting: Student in an Organized Health Care Education/Training Program

## 2019-11-26 DIAGNOSIS — G894 Chronic pain syndrome: Secondary | ICD-10-CM

## 2019-11-28 LAB — TOXASSURE SELECT 13 (MW), URINE

## 2019-12-02 ENCOUNTER — Encounter: Payer: Medicaid Other | Admitting: Student in an Organized Health Care Education/Training Program

## 2020-02-13 ENCOUNTER — Other Ambulatory Visit: Payer: Self-pay | Admitting: Student in an Organized Health Care Education/Training Program

## 2020-02-13 DIAGNOSIS — G894 Chronic pain syndrome: Secondary | ICD-10-CM

## 2020-02-14 ENCOUNTER — Encounter: Payer: Medicaid Other | Admitting: Student in an Organized Health Care Education/Training Program

## 2020-02-15 ENCOUNTER — Ambulatory Visit
Payer: Medicaid Other | Attending: Student in an Organized Health Care Education/Training Program | Admitting: Student in an Organized Health Care Education/Training Program

## 2020-02-15 ENCOUNTER — Encounter: Payer: Self-pay | Admitting: Student in an Organized Health Care Education/Training Program

## 2020-02-15 ENCOUNTER — Other Ambulatory Visit: Payer: Self-pay

## 2020-02-15 VITALS — BP 117/102 | HR 85 | Temp 97.1°F | Resp 16 | Ht 64.0 in | Wt 186.0 lb

## 2020-02-15 DIAGNOSIS — G9050 Complex regional pain syndrome I, unspecified: Secondary | ICD-10-CM | POA: Diagnosis not present

## 2020-02-15 DIAGNOSIS — G5701 Lesion of sciatic nerve, right lower limb: Secondary | ICD-10-CM | POA: Diagnosis present

## 2020-02-15 DIAGNOSIS — S7001XS Contusion of right hip, sequela: Secondary | ICD-10-CM | POA: Diagnosis present

## 2020-02-15 DIAGNOSIS — T85192S Other mechanical complication of implanted electronic neurostimulator (electrode) of spinal cord, sequela: Secondary | ICD-10-CM | POA: Diagnosis present

## 2020-02-15 DIAGNOSIS — Z981 Arthrodesis status: Secondary | ICD-10-CM | POA: Diagnosis present

## 2020-02-15 DIAGNOSIS — G894 Chronic pain syndrome: Secondary | ICD-10-CM | POA: Diagnosis present

## 2020-02-15 DIAGNOSIS — M533 Sacrococcygeal disorders, not elsewhere classified: Secondary | ICD-10-CM | POA: Insufficient documentation

## 2020-02-15 DIAGNOSIS — M47812 Spondylosis without myelopathy or radiculopathy, cervical region: Secondary | ICD-10-CM | POA: Insufficient documentation

## 2020-02-15 DIAGNOSIS — M4802 Spinal stenosis, cervical region: Secondary | ICD-10-CM | POA: Diagnosis present

## 2020-02-15 DIAGNOSIS — Z9889 Other specified postprocedural states: Secondary | ICD-10-CM | POA: Insufficient documentation

## 2020-02-15 DIAGNOSIS — Q761 Klippel-Feil syndrome: Secondary | ICD-10-CM

## 2020-02-15 DIAGNOSIS — M47816 Spondylosis without myelopathy or radiculopathy, lumbar region: Secondary | ICD-10-CM

## 2020-02-15 DIAGNOSIS — G5703 Lesion of sciatic nerve, bilateral lower limbs: Secondary | ICD-10-CM | POA: Insufficient documentation

## 2020-02-15 MED ORDER — FENTANYL 50 MCG/HR TD PT72: 1.0000 | MEDICATED_PATCH | TRANSDERMAL | 0 refills | Status: DC

## 2020-02-15 NOTE — Progress Notes (Signed)
Nursing Pain Medication Assessment:  Safety precautions to be maintained throughout the outpatient stay will include: orient to surroundings, keep bed in low position, maintain call bell within reach at all times, provide assistance with transfer out of bed and ambulation.  Medication Inspection Compliance: Lindsay Austin did not comply with our request to bring her pills to be counted. She was reminded that bringing the medication bottles, even when empty, is a requirement.  Medication: None brought in. Pill/Patch Count: None available to be counted. Bottle Appearance: No container available. Did not bring bottle(s) to appointment. Filled Date: N/A Last Medication intake:  Today  Reminded to bring pills/bottles to appointments for count

## 2020-02-15 NOTE — Progress Notes (Signed)
PROVIDER NOTE: Information contained herein reflects review and annotations entered in association with encounter. Interpretation of such information and data should be left to medically-trained personnel. Information provided to patient can be located elsewhere in the medical record under "Patient Instructions". Document created using STT-dictation technology, any transcriptional errors that may result from process are unintentional.    Patient: Lindsay Austin  Service Category: E/M  Provider: [name], MD  DOB: 1961/02/22  DOS: 02/15/2020  Specialty: Interventional Pain Management  MRN: [id number]  Setting: Ambulatory outpatient  PCP: [name], PA  Type: Established Patient    Referring Provider: [name], PA  Location: Office  Delivery: Face-to-face     HPI  Lindsay Austin, a 59 y.o. 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 Lindsay Austin primary complain today is Neck Pain, Back Pain, and Hip Pain Last encounter: My last encounter with her was on 02/13/2020. Pertinent problems: Lindsay Austin has Hx of lumbosacral spine surgery; History of lumbar fusion (L5-S1 fusion); Failed spinal cord stimulator, sequela; Cervical fusion syndrome (C4-C7); Chronic pain syndrome; Major depressive disorder with current active episode; Lumbar spondylosis; Cervical spondylosis without myelopathy; and Spinal stenosis in cervical region on their pertinent problem list. Pain Assessment: Severity of Chronic pain is reported as a 5 /10. Location: Neck  /radiates into both shoulders, right is worse. Onset: More than a month ago. Quality: Numbness (feels like an electrical snap). Timing: Constant. Modifying factor(s): lying down with pillows, repositioning. Vitals:  height is '5\' 4"'  (1.626 m) and weight is 186 lb (84.4 kg). Her temperature is 97.1 F (36.2 C) (abnormal). Her blood pressure is 117/102 (abnormal) and her pulse is 85. Her respiration is 16 and oxygen saturation is 93%.    Reason for encounter: medication management.   Patient follows up today for medication management.  She is endorsing increased right-sided hip and buttock pain.  She has been told that this could be related to piriformis syndrome.  Patient likely also has SI joint dysfunction and piriformis syndrome contributing to her low back, buttock and hip pain in that region.  She is on MS Contin 60 mg twice daily.  She has been on this medication for many years, prior to me taking her on for chronic opioid therapy [place]  She is endorsing increased fatigue and cognitive dysfunction and memory recall.  Patient does have chronic kidney disease, stage III.  I discussed the concerns of morphine metabolites and patient is to have compromised renal function and for this reason discussed opioid rotation to fentanyl patch.  While I usually do not recommend fentanyl therapy for nonmalignant pain, in Jonny's case, with her chronic kidney disease and her being on long-acting morphine, I feel that fentanyl may be a better analgesic option for her and help with her side effects of memory recall and cognitive dysfunction that could be coming from morphine.  Patient has Narcan at home.  Pharmacotherapy Assessment   Analgesic: MS Contin 60 mg BID (pt transferred care from [place], was previously on 200 mg daily)}   Monitoring: [place] PMP: PDMP reviewed during this encounter.       Pharmacotherapy: No side-effects or adverse reactions reported. Compliance: No problems identified. Effectiveness: Clinically acceptable.  Dewayne Shorter, RN  02/15/2020 11:48 AM  Signed Nursing Pain Medication Assessment:  Safety precautions to be maintained throughout the outpatient stay will include: orient to surroundings, keep bed in low position, maintain call bell within  reach at all times, provide assistance with transfer out of bed and ambulation.  Medication Inspection Compliance: Ms. Din did not comply with  our request to bring her pills to be counted. She was reminded that bringing the medication bottles, even when empty, is a requirement.  Medication: None brought in. Pill/Patch Count: None available to be counted. Bottle Appearance: No container available. Did not bring bottle(s) to appointment. Filled Date: N/A Last Medication intake:  Today  Reminded to bring pills/bottles to appointments for count    UDS:  Summary  Date Value Ref Range Status  11/25/2019 Note  Final    Comment:    ==================================================================== [employer] Select 13 (MW) ==================================================================== Test                             Result       Flag       Units  Drug Present   Morphine                       >6579                   ng/mg creat   Normorphine                    1221                    ng/mg creat    Potential sources of large amounts of morphine in the absence of    codeine include administration of morphine or use of heroin.     Normorphine is an expected metabolite of morphine.    Hydromorphone                  199                     ng/mg creat    Hydromorphone may be present as a metabolite of morphine;    concentrations of hydromorphone rarely exceed 5% of the morphine    concentration when this is the source of hydromorphone.  ==================================================================== Test                      Result    Flag   Units      Ref Range   Creatinine              152              mg/dL      >=20 ==================================================================== Declared Medications:  Medication list was not provided. ==================================================================== For clinical consultation, please call [phone]. ====================================================================      ROS  Constitutional: Denies any fever or chills Gastrointestinal: No  reported hemesis, hematochezia, vomiting, or acute GI distress Musculoskeletal: Right buttock, right hip pain  Neurological: No reported episodes of acute onset apraxia, aphasia, dysarthria, agnosia, amnesia, paralysis, loss of coordination, or loss of consciousness  Medication Review  Galcanezumab-gnlm, Multiple Minerals-Vitamins, acetaminophen, albuterol, buPROPion, dicyclomine, escitalopram, fentaNYL, methocarbamol, naloxone, ondansetron, pregabalin, rizatriptan, sucralfate, traZODone, and triamcinolone cream  History Review  Allergy: Ms. Cahoon is allergic to methadone, other, and tape. Drug: Ms. Skalsky  reports no history of drug use. Alcohol:  reports no history of alcohol use. Tobacco:  reports that she has never smoked. She has never used smokeless tobacco. Social: Ms. Rosenwald  reports that she has never smoked. She has never used smokeless tobacco.  She reports that she does not drink alcohol and does not use drugs. Medical:  has a past medical history of Anxiety, Arthritis, Bladder cancer ([place]), Cancer of kidney ([place]), DDD (degenerative disc disease), cervical, Depression, Failed back syndrome, Fibromyalgia, GERD (gastroesophageal reflux disease), Headache, Heart murmur, Hyperlipemia, Hypertension, IBS (irritable bowel syndrome), Kidney failure, Renal insufficiency, RSD (reflex sympathetic dystrophy), Seizure ([place]), Stomach ulcer, and Trigeminal neuralgia. Surgical: Ms. Heckert  has a past surgical history that includes Transurethral resection of bladder tumor with gyrus (turbt-gyrus) (2012, 2013); Nephroureterectomy (2011); Back surgery (1995); Cholecystectomy; Brain surgery (2004); trigeminal neuralgia (2004); Spinal cord stimulator implant (2004); colonscopy (2006); Posterior cervical fusion/foraminotomy (Bilateral, 07/05/2019); Posterior cervical laminectomy (Bilateral, 07/05/2019); and Breast biopsy (Left, 2017). Family: family history includes Dementia in her father; Diabetes in her mother; Heart  disease in her mother; Kidney disease in her father.  Laboratory Chemistry Profile   Renal Lab Results  Component Value Date   BUN 16 08/31/2019   CREATININE 1.32 (H) 08/31/2019   LABCREA 71 08/29/2019   GFRAA 51 (L) 08/31/2019   GFRNONAA 44 (L) 08/31/2019     Hepatic Lab Results  Component Value Date   AST 32 08/29/2019   ALT 12 08/29/2019   ALBUMIN 4.1 08/29/2019   ALKPHOS 66 08/29/2019     Electrolytes Lab Results  Component Value Date   NA 142 08/31/2019   K 3.9 08/31/2019   CL 106 08/31/2019   CALCIUM 8.7 (L) 08/31/2019   MG 2.2 08/31/2019     Bone No results found for: VD25OH, VD125OH2TOT, XT0626RS8, NI6270JJ0, 25OHVITD1, 25OHVITD2, 25OHVITD3, TESTOFREE, TESTOSTERONE   Inflammation (CRP: Acute Phase) (ESR: Chronic Phase) No results found for: CRP, ESRSEDRATE, LATICACIDVEN     Note: Above Lab results reviewed.  Recent Imaging Review  US RENAL CLINICAL DATA:  AKA, history of left nephrectomy.  EXAM: RENAL / URINARY TRACT ULTRASOUND COMPLETE  COMPARISON:  None.  FINDINGS: Right Kidney:  Renal measurements: 10.4 x 5.6 x 5.3 cm = volume: 159 mL . Echogenicity within normal limits. No mass or hydronephrosis visualized.  Left Kidney:  Surgically absent.  Bladder:  Appears normal for degree of bladder distention.  Other:  None.  IMPRESSION: Normal sonographic appearance of the right kidney.  Electronically Signed   By: Audie Pinto M.D.   On: 08/29/2019 16:14 DG Chest Portable 1 View CLINICAL DATA:  Weakness, found unresponsive  EXAM: PORTABLE CHEST 1 VIEW  COMPARISON:  07/07/2019 chest radiograph.  FINDINGS: Anterior and bilateral posterior spinal fusion hardware in the lower cervical spine. Low lung volumes. Stable cardiomediastinal silhouette with normal heart size. No pneumothorax. No pleural effusion. Lungs appear clear, with no acute consolidative airspace disease and no pulmonary edema.  IMPRESSION: Low lung volumes.  No active cardiopulmonary disease.  Electronically Signed   By: Ilona Sorrel M.D.   On: 08/29/2019 12:04 Note: Reviewed        Physical Exam  General appearance: Well nourished, well developed, and well hydrated. In no apparent acute distress Mental status: Alert, oriented x 3 (person, place, & time)       Respiratory: No evidence of acute respiratory distress Eyes: PERLA Vitals: BP (!) 117/102   Pulse 85   Temp (!) 97.1 F (36.2 C)   Resp 16   Ht '5\' 4"'  (1.626 m)   Wt 186 lb (84.4 kg)   SpO2 93%   BMI 31.93 kg/m  BMI: Estimated body mass index is 31.93 kg/m as calculated from the following:   Height as of this  encounter: '5\' 4"'  (1.626 m).   Weight as of this encounter: 186 lb (84.4 kg). Ideal: Ideal body weight: 54.7 kg (120 lb 9.5 oz) Adjusted ideal body weight: 66.6 kg (146 lb 12.1 oz)   Lumbar Spine Area Exam  Skin & Axial Inspection: Well healed scar from previous spine surgery detected Alignment: Symmetrical Functional ROM: Pain restricted ROM affecting primarily the right Stability: No instability detected Muscle Tone/Strength: Functionally intact. No obvious neuro-muscular anomalies detected. Sensory (Neurological): Musculoskeletal pain pattern Palpation: No palpable anomalies       Provocative Tests: Hyperextension/rotation test: deferred today       Lumbar quadrant test (Kemp's test): deferred today       Lateral bending test: (+) due to pain. Patrick's Maneuver: (+) for right-sided S-I arthralgia             FABER* test: (+) for right-sided S-I arthralgia             S-I anterior distraction/compression test: (+) Right-sided       S-I lateral compression test: (+)   S-I arthralgia/arthropathy S-I Thigh-thrust test: (+)   S-I arthralgia/arthropathy S-I Gaenslen's test: deferred today         *(Flexion, ABduction and External Rotation)  Gait & Posture Assessment  Ambulation: Patient came in today in a wheel chair Gait: Limited. Using assistive device to  ambulate Posture: Difficulty standing up straight, due to pain  Lower Extremity Exam    Side: Right lower extremity  Side: Left lower extremity  Stability: No instability observed          Stability: No instability observed          Skin & Extremity Inspection: Skin color, temperature, and hair growth are WNL. No peripheral edema or cyanosis. No masses, redness, swelling, asymmetry, or associated skin lesions. No contractures.  Skin & Extremity Inspection: Skin color, temperature, and hair growth are WNL. No peripheral edema or cyanosis. No masses, redness, swelling, asymmetry, or associated skin lesions. No contractures.  Functional ROM: Pain restricted ROM for all joints of the lower extremity          Functional ROM: Pain restricted ROM for all joints of the lower extremity          Muscle Tone/Strength: Functionally intact. No obvious neuro-muscular anomalies detected.  Muscle Tone/Strength: Functionally intact. No obvious neuro-muscular anomalies detected.  Sensory (Neurological): Unimpaired        Sensory (Neurological): Unimpaired        DTR: Patellar: deferred today Achilles: deferred today Plantar: deferred today  DTR: Patellar: deferred today Achilles: deferred today Plantar: deferred today  Palpation: No palpable anomalies  Palpation: No palpable anomalies      Assessment   Status Diagnosis  Controlled Controlled Controlled 1. Cervical fusion syndrome (C4-T2)   2. RSD (reflex sympathetic dystrophy)   3. Lumbar spondylosis   4. Cervical spondylosis without myelopathy   5. History of lumbar fusion   6. Spinal stenosis in cervical region   7. Failed spinal cord stimulator, sequela   8. Contusion of right hip, sequela   9. Hx of lumbosacral spine surgery   10. Lumbar facet arthropathy   11. Chronic pain syndrome   12. Piriformis syndrome of right side   13. Sacroiliac joint dysfunction of right side      Updated Problems: Problem  Piriformis Syndrome of  Right Side  Sacroiliac Joint Dysfunction of Right Side    Plan of Care  Ms. Maxi Carreras has a current medication list which  includes the following long-term medication(s): albuterol, bupropion, dicyclomine, escitalopram, rizatriptan, sucralfate, and trazodone.  1.  Discontinue morphine.  Opioid rotation, equivalent MME to fentanyl patch given chronic kidney disease and side effects of memory dysfunction and fatigue associated with possible morphine intake in the context of reduced kidney function. 2.  Start fentanyl patch as below.  50 mcg an hour.  Equivalent to patient's prior dose of morphine 60 mg twice daily.  Patient has Narcan at home.  Discussed safe application of fentanyl patch. 3.  Continue with home stretching exercises.  Plan for diagnostic right sacroiliac joint injection and right piriformis muscle injection for SI joint dysfunction, piriformis syndrome.  Pharmacotherapy (Medications Ordered): Meds ordered this encounter  Medications  . fentaNYL (DURAGESIC) 50 MCG/HR    Sig: Place 1 patch onto the skin every 3 (three) days. Most last 30 days.    Dispense:  10 patch    Refill:  0    [facility] STOP ACT - Not applicable. Fill one day early if pharmacy is closed on scheduled refill date.   Orders:  Orders Placed This Encounter  Procedures  . SACROILIAC JOINT INJECTION    Standing Status:   Future    Standing Expiration Date:   03/17/2020    Scheduling Instructions:     Side: RIGHT     Sedation: without     Timeframe: ASAP    Order Specific Question:   Where will this procedure be performed?    Answer:   [facility]  . TRIGGER POINT INJECTION    Area: Buttocks region (gluteal area) Indications: Piriformis muscle pain; RIGHT  piriformis-syndrome; piriformis muscle spasms (A56.979). CPT code: 20552    Scheduling Instructions:     Type: Myoneural block (TPI) of piriformis muscle.     Right    Order Specific Question:   Where will this procedure be performed?     Answer:   [facility]   Follow-up plan:   Return in about 4 weeks (around 03/14/2020) for Medication Management, in person.    Recent Visits Date Type Provider Dept  11/25/19 Office Visit [name], MD [facility]  Showing recent visits within past 90 days and meeting all other requirements Today's Visits Date Type Provider Dept  02/15/20 Office Visit [name], MD [facility]  Showing today's visits and meeting all other requirements Future Appointments Date Type Provider Dept  03/08/20 Appointment [name], MD [facility]  Showing future appointments within next 90 days and meeting all other requirements  I discussed the assessment and treatment plan with the patient. The patient was provided an opportunity to ask questions and all were answered. The patient agreed with the plan and demonstrated an understanding of the instructions.  Patient advised to call back or seek an in-person evaluation if the symptoms or condition worsens.  Duration of encounter: 54mnutes.  Note by: BGillis Santa MD Date: 02/15/2020; Time: 12:39 PM

## 2020-02-15 NOTE — Patient Instructions (Addendum)
Fentanyl patches to last until 03/16/20 have been escribed to your pharmacy.  Moderate Conscious Sedation, Adult Sedation is the use of medicines to promote relaxation and relieve discomfort and anxiety. Moderate conscious sedation is a type of sedation. Under moderate conscious sedation, you are less alert than normal, but you are still able to respond to instructions, touch, or both. Moderate conscious sedation is used during short medical and dental procedures. It is milder than deep sedation, which is a type of sedation under which you cannot be easily woken up. It is also milder than general anesthesia, which is the use of medicines to make you unconscious. Moderate conscious sedation allows you to return to your regular activities sooner. Tell a health care provider about:  Any allergies you have.  All medicines you are taking, including vitamins, herbs, eye drops, creams, and over-the-counter medicines.  Use of steroids (by mouth or creams).  Any problems you or family members have had with sedatives and anesthetic medicines.  Any blood disorders you have.  Any surgeries you have had.  Any medical conditions you have, such as sleep apnea.  Whether you are pregnant or may be pregnant.  Any use of cigarettes, alcohol, marijuana, or street drugs. What are the risks? Generally, this is a safe procedure. However, problems may occur, including:  Getting too much medicine (oversedation).  Nausea.  Allergic reaction to medicines.  Trouble breathing. If this happens, a breathing tube may be used to help with breathing. It will be removed when you are awake and breathing on your own.  Heart trouble.  Lung trouble. What happens before the procedure? Staying hydrated Follow instructions from your health care provider about hydration, which may include:  Up to 2 hours before the procedure - you may continue to drink clear liquids, such as water, clear fruit juice, black coffee,  and plain tea. Eating and drinking restrictions Follow instructions from your health care provider about eating and drinking, which may include:  8 hours before the procedure - stop eating heavy meals or foods such as meat, fried foods, or fatty foods.  6 hours before the procedure - stop eating light meals or foods, such as toast or cereal.  6 hours before the procedure - stop drinking milk or drinks that contain milk.  2 hours before the procedure - stop drinking clear liquids. Medicine Ask your health care provider about:  Changing or stopping your regular medicines. This is especially important if you are taking diabetes medicines or blood thinners.  Taking medicines such as aspirin and ibuprofen. These medicines can thin your blood. Do not take these medicines before your procedure if your health care provider instructs you not to.  Tests and exams  You will have a physical exam.  You may have blood tests done to show: ? How well your kidneys and liver are working. ? How well your blood can clot. General instructions  Plan to have someone take you home from the hospital or clinic.  If you will be going home right after the procedure, plan to have someone with you for 24 hours. What happens during the procedure?  An IV tube will be inserted into one of your veins.  Medicine to help you relax (sedative) will be given through the IV tube.  The medical or dental procedure will be performed. What happens after the procedure?  Your blood pressure, heart rate, breathing rate, and blood oxygen level will be monitored often until the medicines you were given have  worn off.  Do not drive for 24 hours. This information is not intended to replace advice given to you by your health care provider. Make sure you discuss any questions you have with your health care provider. Document Revised: 03/28/2017 Document Reviewed: 08/05/2015 Elsevier Patient Education  [place]  What are the risk, side effects and possible complications? Generally speaking, most procedures are safe.  However, with any procedure there are risks, side effects, and the possibility of complications.  The risks and complications are dependent upon the sites that are lesioned, or the type of nerve block to be performed.  The closer the procedure is to the spine, the more serious the risks are.  Great care is taken when placing the radio frequency needles, block needles or lesioning probes, but sometimes complications can occur. 1. Infection: Any time there is an injection through the skin, there is a risk of infection.  This is why sterile conditions are used for these blocks.  There are four possible types of infection. 1. Localized skin infection. 2. Central Nervous System Infection-This can be in the form of Meningitis, which can be deadly. 3. Epidural Infections-This can be in the form of an epidural abscess, which can cause pressure inside of the spine, causing compression of the spinal cord with subsequent paralysis. This would require an emergency surgery to decompress, and there are no guarantees that the patient would recover from the paralysis. 4. Discitis-This is an infection of the intervertebral discs.  It occurs in about 1% of discography procedures.  It is difficult to treat and it may lead to surgery.        2. Pain: the needles have to go through skin and soft tissues, will cause soreness.       3. Damage to internal structures:  The nerves to be lesioned may be near blood vessels or    other nerves which can be potentially damaged.       4. Bleeding: Bleeding is more common if the patient is taking blood thinners such as  aspirin, Coumadin, Ticiid, Plavix, etc., or if he/she have some genetic predisposition  such as hemophilia. Bleeding into the spinal canal can cause compression of the spinal  cord with subsequent paralysis.  This would  require an emergency surgery to  decompress and there are no guarantees that the patient would recover from the  paralysis.       5. Pneumothorax:  Puncturing of a lung is a possibility, every time a needle is introduced in  the area of the chest or upper back.  Pneumothorax refers to free air around the  collapsed lung(s), inside of the thoracic cavity (chest cavity).  Another two possible  complications related to a similar event would include: Hemothorax and Chylothorax.   These are variations of the Pneumothorax, where instead of air around the collapsed  lung(s), you may have blood or chyle, respectively.       6. Spinal headaches: They may occur with any procedures in the area of the spine.       7. Persistent CSF (Cerebro-Spinal Fluid) leakage: This is a rare problem, but may occur  with prolonged intrathecal or epidural catheters either due to the formation of a fistulous  track or a dural tear.       8. Nerve damage: By working so close to the spinal cord, there is always a possibility of  nerve damage, which could be as serious as a  permanent spinal cord injury with  paralysis.       9. Death:  Although rare, severe deadly allergic reactions known as "Anaphylactic  reaction" can occur to any of the medications used.      10. Worsening of the symptoms:  We can always make thing worse.  What are the chances of something like this happening? Chances of any of this occuring are extremely low.  By statistics, you have more of a chance of getting killed in a motor vehicle accident: while driving to the hospital than any of the above occurring .  Nevertheless, you should be aware that they are possibilities.  In general, it is similar to taking a shower.  Everybody knows that you can slip, hit your head and get killed.  Does that mean that you should not shower again?  Nevertheless always keep in mind that statistics do not mean anything if you happen to be on the wrong side of them.  Even if a procedure  has a 1 (one) in a 1,000,000 (million) chance of going wrong, it you happen to be that one..Also, keep in mind that by statistics, you have more of a chance of having something go wrong when taking medications.  Who should not have this procedure? If you are on a blood thinning medication (e.g. Coumadin, Plavix, see list of "Blood Thinners"), or if you have an active infection going on, you should not have the procedure.  If you are taking any blood thinners, please inform your physician.  How should I prepare for this procedure?  Do not eat or drink anything at least six hours prior to the procedure.  Bring a driver with you .  It cannot be a taxi.  Come accompanied by an adult that can drive you back, and that is strong enough to help you if your legs get weak or numb from the local anesthetic.  Take all of your medicines the morning of the procedure with just enough water to swallow them.  If you have diabetes, make sure that you are scheduled to have your procedure done first thing in the morning, whenever possible.  If you have diabetes, take only half of your insulin dose and notify our nurse that you have done so as soon as you arrive at the clinic.  If you are diabetic, but only take blood sugar pills (oral hypoglycemic), then do not take them on the morning of your procedure.  You may take them after you have had the procedure.  Do not take aspirin or any aspirin-containing medications, at least eleven (11) days prior to the procedure.  They may prolong bleeding.  Wear loose fitting clothing that may be easy to take off and that you would not mind if it got stained with Betadine or blood.  Do not wear any jewelry or perfume  Remove any nail coloring.  It will interfere with some of our monitoring equipment.  NOTE: Remember that this is not meant to be interpreted as a complete list of all possible complications.  Unforeseen problems may occur.  BLOOD THINNERS The following  drugs contain aspirin or other products, which can cause increased bleeding during surgery and should not be taken for 2 weeks prior to and 1 week after surgery.  If you should need take something for relief of minor pain, you may take acetaminophen which is found in Tylenol,m Datril, Anacin-3 and Panadol. It is not blood thinner. The products listed below are.  Do not  take any of the products listed below in addition to any listed on your instruction sheet.  A.P.C or A.P.C with Codeine Codeine Phosphate Capsules #3 Ibuprofen Ridaura  ABC compound Congesprin Imuran rimadil  Advil Cope Indocin Robaxisal  Alka-Seltzer Effervescent Pain Reliever and Antacid Coricidin or Coricidin-D  Indomethacin Rufen  Alka-Seltzer plus Cold Medicine Cosprin Ketoprofen S-A-C Tablets  Anacin Analgesic Tablets or Capsules Coumadin Korlgesic Salflex  Anacin Extra Strength Analgesic tablets or capsules CP-2 Tablets Lanoril Salicylate  Anaprox Cuprimine Capsules Levenox Salocol  Anexsia-D Dalteparin Magan Salsalate  Anodynos Darvon compound Magnesium Salicylate Sine-off  Ansaid Dasin Capsules Magsal Sodium Salicylate  Anturane Depen Capsules Marnal Soma  APF Arthritis pain formula Dewitt's Pills Measurin Stanback  Argesic Dia-Gesic Meclofenamic Sulfinpyrazone  Arthritis Bayer Timed Release Aspirin Diclofenac Meclomen Sulindac  Arthritis pain formula Anacin Dicumarol Medipren Supac  Analgesic (Safety coated) Arthralgen Diffunasal Mefanamic Suprofen  Arthritis Strength Bufferin Dihydrocodeine Mepro Compound Suprol  Arthropan liquid Dopirydamole Methcarbomol with Aspirin Synalgos  ASA tablets/Enseals Disalcid Micrainin Tagament  Ascriptin Doan's Midol Talwin  Ascriptin A/D Dolene Mobidin Tanderil  Ascriptin Extra Strength Dolobid Moblgesic Ticlid  Ascriptin with Codeine Doloprin or Doloprin with Codeine Momentum Tolectin  Asperbuf Duoprin Mono-gesic Trendar  Aspergum Duradyne Motrin or Motrin IB Triminicin  Aspirin  plain, buffered or enteric coated Durasal Myochrisine Trigesic  Aspirin Suppositories Easprin Nalfon Trillsate  Aspirin with Codeine Ecotrin Regular or Extra Strength Naprosyn Uracel  Atromid-S Efficin Naproxen Ursinus  Auranofin Capsules Elmiron Neocylate Vanquish  Axotal Emagrin Norgesic Verin  Azathioprine Empirin or Empirin with Codeine Normiflo Vitamin E  Azolid Emprazil Nuprin Voltaren  Bayer Aspirin plain, buffered or children's or timed BC Tablets or powders Encaprin Orgaran Warfarin Sodium  Buff-a-Comp Enoxaparin Orudis Zorpin  Buff-a-Comp with Codeine Equegesic Os-Cal-Gesic   Buffaprin Excedrin plain, buffered or Extra Strength Oxalid   Bufferin Arthritis Strength Feldene Oxphenbutazone   Bufferin plain or Extra Strength Feldene Capsules Oxycodone with Aspirin   Bufferin with Codeine Fenoprofen Fenoprofen Pabalate or Pabalate-SF   Buffets II Flogesic Panagesic   Buffinol plain or Extra Strength Florinal or Florinal with Codeine Panwarfarin   Buf-Tabs Flurbiprofen Penicillamine   Butalbital Compound Four-way cold tablets Penicillin   Butazolidin Fragmin Pepto-Bismol   Carbenicillin Geminisyn Percodan   Carna Arthritis Reliever Geopen Persantine   Carprofen Gold's salt Persistin   Chloramphenicol Goody's Phenylbutazone   Chloromycetin Haltrain Piroxlcam   Clmetidine heparin Plaquenil   Cllnoril Hyco-pap Ponstel   Clofibrate Hydroxy chloroquine Propoxyphen         Before stopping any of these medications, be sure to consult the physician who ordered them.  Some, such as Coumadin (Warfarin) are ordered to prevent or treat serious conditions such as "deep thrombosis", "pumonary embolisms", and other heart problems.  The amount of time that you may need off of the medication may also vary with the medication and the reason for which you were taking it.  If you are taking any of these medications, please make sure you notify your pain physician before you undergo any  procedures.         Sacroiliac (SI) Joint Injection Patient Information  Description: The sacroiliac joint connects the scrum (very low back and tailbone) to the ilium (a pelvic bone which also forms half of the hip joint).  Normally this joint experiences very little motion.  When this joint becomes inflamed or unstable low back and or hip and pelvis pain may result.  Injection of this joint with local anesthetics (numbing medicines) and steroids  can provide diagnostic information and reduce pain.  This injection is performed with the aid of x-ray guidance into the tailbone area while you are lying on your stomach.   You may experience an electrical sensation down the leg while this is being done.  You may also experience numbness.  We also may ask if we are reproducing your normal pain during the injection.  Conditions which may be treated SI injection:   Low back, buttock, hip or leg pain  Preparation for the Injection:  7. Do not eat any solid food or dairy products within 8 hours of your appointment.  8. You may drink clear liquids up to 3 hours before appointment.  Clear liquids include water, black coffee, juice or soda.  No milk or cream please. 9. You may take your regular medications, including pain medications with a sip of water before your appointment.  Diabetics should hold regular insulin (if take separately) and take 1/2 normal NPH dose the morning of the procedure.  Carry some sugar containing items with you to your appointment. 10. A driver must accompany you and be prepared to drive you home after your procedure. 11. Bring all of your current medications with you. 12. An IV may be inserted and sedation may be given at the discretion of the physician. 13. A blood pressure cuff, EKG and other monitors will often be applied during the procedure.  Some patients may need to have extra oxygen administered for a short period.  75. You will be asked to provide medical  information, including your allergies, prior to the procedure.  We must know immediately if you are taking blood thinners (like Coumadin/Warfarin) or if you are allergic to IV iodine contrast (dye).  We must know if you could possible be pregnant.  Possible side effects:   Bleeding from needle site  Infection (rare, may require surgery)  Nerve injury (rare)  Numbness & tingling (temporary)  A brief convulsion or seizure  Light-headedness (temporary)  Pain at injection site (several days)  Decreased blood pressure (temporary)  Weakness in the leg (temporary)   Call if you experience:   New onset weakness or numbness of an extremity below the injection site that last more than 8 hours.  Hives or difficulty breathing ( go to the emergency room)  Inflammation or drainage at the injection site  Any new symptoms which are concerning to you  Please note:  Although the local anesthetic injected can often make your back/ hip/ buttock/ leg feel good for several hours after the injections, the pain will likely return.  It takes 3-7 days for steroids to work in the sacroiliac area.  You may not notice any pain relief for at least that one week.  If effective, we will often do a series of three injections spaced 3-6 weeks apart to maximally decrease your pain.  After the initial series, we generally will wait some months before a repeat injection of the same type.  If you have any questions, please call [phone] [facility]

## 2020-02-27 ENCOUNTER — Encounter: Payer: Self-pay | Admitting: Student in an Organized Health Care Education/Training Program

## 2020-03-08 ENCOUNTER — Encounter: Payer: Self-pay | Admitting: Student in an Organized Health Care Education/Training Program

## 2020-03-08 ENCOUNTER — Encounter: Payer: Medicaid Other | Admitting: Student in an Organized Health Care Education/Training Program

## 2020-03-08 ENCOUNTER — Other Ambulatory Visit: Payer: Self-pay

## 2020-03-08 ENCOUNTER — Ambulatory Visit
Admission: RE | Admit: 2020-03-08 | Discharge: 2020-03-08 | Disposition: A | Discharge status: Home / Self Care | Payer: Medicaid Other | Source: Ambulatory Visit | Attending: Student in an Organized Health Care Education/Training Program | Admitting: Student in an Organized Health Care Education/Training Program

## 2020-03-08 ENCOUNTER — Ambulatory Visit (HOSPITAL_BASED_OUTPATIENT_CLINIC_OR_DEPARTMENT_OTHER): Payer: Medicaid Other | Admitting: Student in an Organized Health Care Education/Training Program

## 2020-03-08 DIAGNOSIS — M25551 Pain in right hip: Secondary | ICD-10-CM | POA: Insufficient documentation

## 2020-03-08 DIAGNOSIS — M533 Sacrococcygeal disorders, not elsewhere classified: Secondary | ICD-10-CM | POA: Insufficient documentation

## 2020-03-08 DIAGNOSIS — G5701 Lesion of sciatic nerve, right lower limb: Secondary | ICD-10-CM | POA: Insufficient documentation

## 2020-03-08 DIAGNOSIS — G894 Chronic pain syndrome: Secondary | ICD-10-CM | POA: Diagnosis not present

## 2020-03-08 MED ORDER — FENTANYL CITRATE (PF) 100 MCG/2ML IJ SOLN
INTRAMUSCULAR | Status: AC
  Filled 2020-03-08: qty 2

## 2020-03-08 MED ORDER — ROPIVACAINE HCL 2 MG/ML IJ SOLN
9.0000 mL | Freq: Once | INTRAMUSCULAR | Status: AC
  Administered 2020-03-08: 9 mL via INTRA_ARTICULAR

## 2020-03-08 MED ORDER — METHYLPREDNISOLONE ACETATE 40 MG/ML IJ SUSP
INTRAMUSCULAR | Status: AC
  Filled 2020-03-08: qty 1

## 2020-03-08 MED ORDER — IOHEXOL 180 MG/ML  SOLN
10.0000 mL | Freq: Once | INTRAMUSCULAR | Status: AC
  Administered 2020-03-08: 10 mL via INTRA_ARTICULAR

## 2020-03-08 MED ORDER — DEXAMETHASONE SODIUM PHOSPHATE 10 MG/ML IJ SOLN
10.0000 mg | Freq: Once | INTRAMUSCULAR | Status: AC
  Administered 2020-03-08: 10 mg

## 2020-03-08 MED ORDER — METHYLPREDNISOLONE ACETATE 40 MG/ML IJ SUSP
40.0000 mg | Freq: Once | INTRAMUSCULAR | Status: AC
  Administered 2020-03-08: 40 mg via INTRA_ARTICULAR

## 2020-03-08 MED ORDER — FENTANYL 50 MCG/HR TD PT72: 1.0000 | MEDICATED_PATCH | TRANSDERMAL | 0 refills | Status: AC

## 2020-03-08 MED ORDER — DEXAMETHASONE SODIUM PHOSPHATE 10 MG/ML IJ SOLN
INTRAMUSCULAR | Status: AC
  Filled 2020-03-08: qty 1

## 2020-03-08 MED ORDER — FENTANYL CITRATE (PF) 100 MCG/2ML IJ SOLN
25.0000 ug | INTRAMUSCULAR | Status: DC | PRN
  Administered 2020-03-08: 50 ug via INTRAVENOUS

## 2020-03-08 MED ORDER — FENTANYL 50 MCG/HR TD PT72: 1.0000 | MEDICATED_PATCH | TRANSDERMAL | 0 refills | Status: DC

## 2020-03-08 MED ORDER — ROPIVACAINE HCL 2 MG/ML IJ SOLN
INTRAMUSCULAR | Status: AC
  Filled 2020-03-08: qty 10

## 2020-03-08 MED ORDER — LIDOCAINE HCL 2 % IJ SOLN
20.0000 mL | Freq: Once | INTRAMUSCULAR | Status: AC
  Administered 2020-03-08: 400 mg

## 2020-03-08 MED ORDER — LIDOCAINE HCL 2 % IJ SOLN
INTRAMUSCULAR | Status: AC
  Filled 2020-03-08: qty 20

## 2020-03-08 NOTE — Progress Notes (Signed)
PROVIDER NOTE: Information contained herein reflects review and annotations entered in association with encounter. Interpretation of such information and data should be left to medically-trained personnel. Information provided to patient can be located elsewhere in the medical record under "Patient Instructions". Document created using STT-dictation technology, any transcriptional errors that may result from process are unintentional.    Patient: Lindsay Austin  Service Category: Procedure  Provider: [name], MD  DOB: 30-Jul-1960  DOS: 03/08/2020  Location: [facility]  MRN: [id number]  Setting: Ambulatory - outpatient  Referring Provider: [name], MD  Type: Established Patient  Specialty: Interventional Pain Management  PCP: [name], [place]   Primary Reason for Visit: Interventional Pain Management Treatment. CC: Hip Pain (right)  Procedure:          Anesthesia, Analgesia, Anxiolysis:  Type: Diagnostic Sacroiliac Joint Steroid Injection and right piriformis injection #1  Region: Inferior Lumbosacral Region Level: PIIS (Posterior Inferior Iliac Spine) Laterality: Right-Side  Type: Moderate (Conscious) Sedation combined with Local Anesthesia Indication(s): Analgesia and Anxiety Route: Intravenous (IV) IV Access: Secured Sedation: Meaningful verbal contact was maintained at all times during the procedure  Local Anesthetic: Lidocaine 1-2%  Position: Prone           Indications: 1. Chronic pain syndrome   2. Piriformis syndrome of right side   3. Sacroiliac joint dysfunction of right side    Pain Score: Pre-procedure: 4 /10 Post-procedure: 0-No pain/10   Pre-op Assessment:  Lindsay Austin is a 59 y.o. (year old), female patient, seen today for interventional treatment. 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 Lindsay Austin has a current medication list which includes the following prescription(s): acetaminophen, albuterol, bupropion, dicyclomine, escitalopram, [START ON 03/16/2020] fentanyl, [START ON 04/15/2020] fentanyl, emgality, methocarbamol, multiple minerals-vitamins, naloxone, olmesartan, ondansetron, pregabalin, rizatriptan, sucralfate, trazodone, and triamcinolone cream, and the following Facility-Administered Medications: fentanyl. Her primarily concern today is the Hip Pain (right)  Initial Vital Signs:  Pulse/HCG Rate: 62ECG Heart Rate: (!) 59 Temp: 99.1 F (37.3 C) Resp: 18 BP: 139/87 SpO2: 96 %  BMI: Estimated body mass index is 31.41 kg/m as calculated from the following:   Height as of this encounter: 5\' 4"  (1.626 m).   Weight as of this encounter: 183 lb (83 kg).  Risk Assessment: Allergies: Reviewed. She is allergic to methadone, other, and tape.  Allergy Precautions: None required Coagulopathies: Reviewed. None identified.  Blood-thinner therapy: None at this time Active Infection(s): Reviewed. None identified. Lindsay Austin is afebrile  Site Confirmation: Lindsay Austin was asked to confirm the procedure and laterality before marking the site Procedure checklist: Completed Consent: Before the procedure and under the influence of no sedative(s), amnesic(s), or anxiolytics, the patient was informed of the treatment options, risks and possible complications. To fulfill our ethical and legal obligations, as recommended by the American Medical Association's Code of Ethics, I have informed the patient of my clinical impression; the nature and purpose of the treatment or procedure; the risks, benefits, and possible complications of the intervention; the  alternatives, including doing nothing; the risk(s) and benefit(s) of the alternative treatment(s) or procedure(s); and the risk(s) and benefit(s) of doing nothing. The patient was provided information about the general risks and possible complications associated with  the procedure. These may include, but are not limited to: failure to achieve desired goals, infection, bleeding, organ or nerve damage, allergic reactions, paralysis, and death. In addition, the patient was informed of those risks and complications associated to the procedure, such as failure to decrease pain; infection; bleeding; organ or nerve damage with subsequent damage to sensory, motor, and/or autonomic systems, resulting in permanent pain, numbness, and/or weakness of one or several areas of the body; allergic reactions; (i.e.: anaphylactic reaction); and/or death. Furthermore, the patient was informed of those risks and complications associated with the medications. These include, but are not limited to: allergic reactions (i.e.: anaphylactic or anaphylactoid reaction(s)); adrenal axis suppression; blood sugar elevation that in diabetics may result in ketoacidosis or comma; water retention that in patients with history of congestive heart failure may result in shortness of breath, pulmonary edema, and decompensation with resultant heart failure; weight gain; swelling or edema; medication-induced neural toxicity; particulate matter embolism and blood vessel occlusion with resultant organ, and/or nervous system infarction; and/or aseptic necrosis of one or more joints. Finally, the patient was informed that Medicine is not an exact science; therefore, there is also the possibility of unforeseen or unpredictable risks and/or possible complications that may result in a catastrophic outcome. The patient indicated having understood very clearly. We have given the patient no guarantees and we have made no promises. Enough time was given to the  patient to ask questions, all of which were answered to the patient's satisfaction. Lindsay Austin has indicated that she wanted to continue with the procedure. Attestation: I, the ordering provider, attest that I have discussed with the patient the benefits, risks, side-effects, alternatives, likelihood of achieving goals, and potential problems during recovery for the procedure that I have provided informed consent. Date  Time: 03/08/2020 11:08 AM  Pre-Procedure Preparation:  Monitoring: As per clinic protocol. Respiration, ETCO2, SpO2, BP, heart rate and rhythm monitor placed and checked for adequate function Safety Precautions: Patient was assessed for positional comfort and pressure points before starting the procedure. Time-out: I initiated and conducted the "Time-out" before starting the procedure, as per protocol. The patient was asked to participate by confirming the accuracy of the "Time Out" information. Verification of the correct person, site, and procedure were performed and confirmed by me, the nursing staff, and the patient. "Time-out" conducted as per Joint Commission's Universal Protocol (UP.01.01.01). Time: 1221  Description of Procedure:          Target Area: Inferior, posterior, aspect of the sacroiliac fissure Approach: Posterior, paraspinal, ipsilateral approach. Area Prepped: Entire Lower Lumbosacral Region DuraPrep (Iodine Povacrylex [0.7% available iodine] and Isopropyl Alcohol, 74% w/w) Safety Precautions: Aspiration looking for blood return was conducted prior to all injections. At no point did we inject any substances, as a needle was being advanced. No attempts were made at seeking any paresthesias. Safe injection practices and needle disposal techniques used. Medications properly checked for expiration dates. SDV (single dose vial) medications used. Description of the Procedure: Protocol guidelines were followed. The patient was placed in position over the procedure table.  The target area was identified and the area prepped in the usual manner. Skin & deeper tissues infiltrated with local anesthetic. Appropriate amount of time allowed to pass for local anesthetics to take effect. The procedure needle was advanced under fluoroscopic guidance into the sacroiliac joint until a firm endpoint was obtained. Proper needle placement secured. Negative aspiration confirmed. Solution injected in intermittent fashion, asking for systemic symptoms every 0.5cc of injectate. The needles  were then removed and the area cleansed, making sure to leave some of the prepping solution back to take advantage of its long term bactericidal properties. Vitals:   03/08/20 1230 03/08/20 1236 03/08/20 1247 03/08/20 1257  BP: 138/90  129/67 133/65  Pulse:      Resp: 17 16 16 16   Temp:  98.2 F (36.8 C)  98 F (36.7 C)  TempSrc:      SpO2: 94% 95% 94% 95%  Weight:      Height:        Start Time: 1221 hrs. End Time: 1229 hrs. Materials:  Needle(s) Type: Spinal Needle Gauge: 22G Length: 3.5-in Medication(s): Please see orders for medications and dosing details. 5 cc solution made of 4 cc of 0.2% ropivacaine, 1 cc of methylprednisolone, 40 mg/cc.  Injected into the right SI joint.  Patient also had a right piriformis injection done.  This was done 1 cm inferior, 1 cm deep, 1 cm lateral to the inferior pole of her sacroiliac joint.  Contrast was injected to highlight the piriformis muscle belly.  Approximately 4 cc of solution containing 3 cc of 0.2% ropivacaine, 1 cc of Decadron, 10 mg/cc injected into the right piriformis muscle.  Imaging Guidance (Non-Spinal):          Type of Imaging Technique: Fluoroscopy Guidance (Non-Spinal) Indication(s): Assistance in needle guidance and placement for procedures requiring needle placement in or near specific anatomical locations not easily accessible without such assistance. Exposure Time: Please see nurses notes. Contrast: Before injecting any  contrast, we confirmed that the patient did not have an allergy to iodine, shellfish, or radiological contrast. Once satisfactory needle placement was completed at the desired level, radiological contrast was injected. Contrast injected under live fluoroscopy. No contrast complications. See chart for type and volume of contrast used. Fluoroscopic Guidance: I was personally present during the use of fluoroscopy. "Tunnel Vision Technique" used to obtain the best possible view of the target area. Parallax error corrected before commencing the procedure. "Direction-depth-direction" technique used to introduce the needle under continuous pulsed fluoroscopy. Once target was reached, antero-posterior, oblique, and lateral fluoroscopic projection used confirm needle placement in all planes. Images permanently stored in EMR. Interpretation: I personally interpreted the imaging intraoperatively. Adequate needle placement confirmed in multiple planes. Appropriate spread of contrast into desired area was observed. No evidence of afferent or efferent intravascular uptake. Permanent images saved into the patient's record.   Post-operative Assessment:  Post-procedure Vital Signs:  Pulse/HCG Rate: 62(!) 57 Temp: 98 F (36.7 C) Resp: 16 BP: 133/65 SpO2: 95 %  EBL: None  Complications: No immediate post-treatment complications observed by team, or reported by patient.  Note: The patient tolerated the entire procedure well. A repeat set of vitals were taken after the procedure and the patient was kept under observation following institutional policy, for this type of procedure. Post-procedural neurological assessment was performed, showing return to baseline, prior to discharge. The patient was provided with post-procedure discharge instructions, including a section on how to identify potential problems. Should any problems arise concerning this procedure, the patient was given instructions to immediately contact us,  at any time, without hesitation. In any case, we plan to contact the patient by telephone for a follow-up status report regarding this interventional procedure.  Comments:  No additional relevant information.  Plan of Care  Orders:  Orders Placed This Encounter  Procedures  . DG PAIN CLINIC C-ARM 1-60 MIN NO REPORT    Intraoperative interpretation by procedural physician at [facility].    Standing Status:   Standing    Number of Occurrences:   1    Order Specific Question:   Reason for exam:    Answer:   Assistance in needle guidance and placement for procedures requiring needle placement in or near specific anatomical locations not easily accessible without such assistance.   Chronic Opioid Analgesic:  Patient was transition from morphine 60 mg twice daily to fentanyl patch at 50 mcg an hour given decreased analgesic benefit with morphine.  This is an equivalent analgesic dose at 120 MME.  Patient is finding better analgesic benefit on her fentanyl patch at 50 mcg an hour.  We will refill as below.  PMP checked and appropriate.    Pharmacotherapy Assessment   Analgesic: Fentanyl patch 50 mcg an hour, MME equals 120    Monitoring: [place] PMP: PDMP reviewed during this encounter.       Pharmacotherapy: No side-effects or adverse reactions reported. Compliance: No problems identified. Effectiveness: Clinically acceptable.  UDS:  Summary  Date Value Ref Range Status  11/25/2019 Note  Final    Comment:    ==================================================================== Tox[employer] Select 13 (MW) ==================================================================== Test                             Result       Flag       Units  Drug Present   Morphine                       >6579                   ng/mg creat   Normorphine                    1221                    ng/mg creat    Potential sources of large amounts of morphine in the absence of    codeine include  administration of morphine or use of heroin.     Normorphine is an expected metabolite of morphine.    Hydromorphone                  199                     ng/mg creat    Hydromorphone may be present as a metabolite of morphine;    concentrations of hydromorphone rarely exceed 5% of the morphine    concentration when this is the source of hydromorphone.  ==================================================================== Test                      Result    Flag   Units      Ref Range   Creatinine              152              mg/dL      >=20 ==================================================================== Declared Medications:  Medication list was not provided. ==================================================================== For clinical consultation, please call [phone]. ====================================================================       Medications ordered for procedure: Meds ordered this encounter  Medications  . iohexol (OMNIPAQUE) 180 MG/ML injection 10 mL    Must be Myelogram-compatible. If not available, you may substitute with a water-soluble, non-ionic, hypoallergenic, myelogram-compatible radiological contrast medium.  Marland Kitchen lidocaine (XYLOCAINE) 2 % (  with pres) injection 400 mg  . fentaNYL (SUBLIMAZE) injection 25-50 mcg    Make sure Narcan is available in the pyxis when using this medication. In the event of respiratory depression (RR< 8/min): Titrate NARCAN (naloxone) in increments of 0.1 to 0.2 mg IV at 2-3 minute intervals, until desired degree of reversal.  . ropivacaine (PF) 2 mg/mL (0.2%) (NAROPIN) injection 9 mL  . methylPREDNISolone acetate (DEPO-MEDROL) injection 40 mg  . dexamethasone (DECADRON) injection 10 mg  . fentaNYL (DURAGESIC) 50 MCG/HR    Sig: Place 1 patch onto the skin every 3 (three) days. Most last 30 days.    Dispense:  10 patch    Refill:  0    [place] STOP ACT - Not applicable. Fill one day early if pharmacy is closed on  scheduled refill date.  . fentaNYL (DURAGESIC) 50 MCG/HR    Sig: Place 1 patch onto the skin every 3 (three) days. Most last 30 days.    Dispense:  10 patch    Refill:  0     STOP ACT - Not applicable. Fill one day early if pharmacy is closed on scheduled refill date.   Medications administered: We administered iohexol, lidocaine, fentaNYL, ropivacaine (PF) 2 mg/mL (0.2%), methylPREDNISolone acetate, and dexamethasone.  See the medical record for exact dosing, route, and time of administration.  Follow-up plan:   Return in about 9 weeks (around 05/10/2020) for Post Procedure Evaluation, Medication Management, in person.    Recent Visits Date Type Provider Dept  02/15/20 Office Visit [name], MD [facility]  Showing recent visits within past 90 days and meeting all other requirements Today's Visits Date Type Provider Dept  03/08/20 Procedure visit [name], MD [facility]  Showing today's visits and meeting all other requirements Future Appointments Date Type Provider Dept  05/11/20 Appointment [name], MD [facility]  Showing future appointments within next 90 days and meeting all other requirements  Disposition: Discharge home  Discharge (Date  Time): 03/08/2020; 1257 (Discharge approved by Dr Holley Raring.  Moving legs equally, no numbness.) hrs.   Primary Care Physician: [name], [place] Location: [facility] Note by: [name], MD Date: 03/08/2020; Time: 2:31 PM  Disclaimer:  Medicine is not an exact science. The only guarantee in medicine is that nothing is guaranteed. It is important to note that the decision to proceed with this intervention was based on the information collected from the patient. The Data and conclusions were drawn from the patient's questionnaire, the interview, and the physical examination. Because the information was provided in large part by the patient, it cannot be guaranteed  that it has not been purposely or unconsciously manipulated. Every effort has been made to obtain as much relevant data as possible for this evaluation. It is important to note that the conclusions that lead to this procedure are derived in large part from the available data. Always take into account that the treatment will also be dependent on availability of resources and existing treatment guidelines, considered by other Pain Management Practitioners as being common knowledge and practice, at the time of the intervention. For Medico-Legal purposes, it is also important to point out that variation in procedural techniques and pharmacological choices are the acceptable norm. The indications, contraindications, technique, and results of the above procedure should only be interpreted and judged by a Board-Certified Interventional Pain Specialist with extensive familiarity and expertise in the same exact procedure and technique.

## 2020-03-08 NOTE — Progress Notes (Signed)
Safety precautions to be maintained throughout the outpatient stay will include: orient to surroundings, keep bed in low position, maintain call bell within reach at all times, provide assistance with transfer out of bed and ambulation.  

## 2020-03-08 NOTE — Patient Instructions (Signed)

## 2020-03-09 ENCOUNTER — Telehealth: Payer: Self-pay

## 2020-03-09 NOTE — Telephone Encounter (Signed)
Post procedure follow up.  Patient states she is doing good.  

## 2020-05-09 ENCOUNTER — Telehealth: Payer: Self-pay

## 2020-05-09 NOTE — Telephone Encounter (Signed)
She wants to change her appt Thursday to a virtual. Is this ok?

## 2020-05-09 NOTE — Telephone Encounter (Signed)
No, she is on high dose morphine. Needs to be F2F for any controlled II's unless pressing reason

## 2020-05-10 NOTE — Telephone Encounter (Signed)
Left her a vm letting her know to come in for appt

## 2020-05-11 ENCOUNTER — Other Ambulatory Visit: Payer: Self-pay

## 2020-05-11 ENCOUNTER — Ambulatory Visit
Payer: Medicaid Other | Attending: Student in an Organized Health Care Education/Training Program | Admitting: Student in an Organized Health Care Education/Training Program

## 2020-05-11 ENCOUNTER — Encounter: Payer: Self-pay | Admitting: Student in an Organized Health Care Education/Training Program

## 2020-05-11 VITALS — BP 125/82 | HR 63 | Temp 97.3°F | Resp 18 | Ht 64.0 in | Wt 185.0 lb

## 2020-05-11 DIAGNOSIS — G9050 Complex regional pain syndrome I, unspecified: Secondary | ICD-10-CM

## 2020-05-11 DIAGNOSIS — G5701 Lesion of sciatic nerve, right lower limb: Secondary | ICD-10-CM

## 2020-05-11 DIAGNOSIS — M47812 Spondylosis without myelopathy or radiculopathy, cervical region: Secondary | ICD-10-CM | POA: Diagnosis not present

## 2020-05-11 DIAGNOSIS — S7001XS Contusion of right hip, sequela: Secondary | ICD-10-CM

## 2020-05-11 DIAGNOSIS — T85192S Other mechanical complication of implanted electronic neurostimulator (electrode) of spinal cord, sequela: Secondary | ICD-10-CM | POA: Diagnosis present

## 2020-05-11 DIAGNOSIS — M4802 Spinal stenosis, cervical region: Secondary | ICD-10-CM | POA: Diagnosis present

## 2020-05-11 DIAGNOSIS — Z981 Arthrodesis status: Secondary | ICD-10-CM

## 2020-05-11 DIAGNOSIS — Q761 Klippel-Feil syndrome: Secondary | ICD-10-CM | POA: Diagnosis present

## 2020-05-11 DIAGNOSIS — M533 Sacrococcygeal disorders, not elsewhere classified: Secondary | ICD-10-CM

## 2020-05-11 DIAGNOSIS — M47816 Spondylosis without myelopathy or radiculopathy, lumbar region: Secondary | ICD-10-CM

## 2020-05-11 DIAGNOSIS — G894 Chronic pain syndrome: Secondary | ICD-10-CM | POA: Diagnosis present

## 2020-05-11 MED ORDER — FENTANYL 50 MCG/HR TD PT72
1.0000 | MEDICATED_PATCH | TRANSDERMAL | 0 refills | Status: DC
Start: 2020-05-20 — End: 2020-05-11

## 2020-05-11 MED ORDER — FENTANYL 50 MCG/HR TD PT72: 1.0000 | MEDICATED_PATCH | TRANSDERMAL | 0 refills | Status: DC

## 2020-05-11 MED ORDER — FENTANYL 50 MCG/HR TD PT72: 1.0000 | MEDICATED_PATCH | TRANSDERMAL | 0 refills | Status: AC

## 2020-05-11 MED ORDER — FENTANYL 50 MCG/HR TD PT72
1.0000 | MEDICATED_PATCH | TRANSDERMAL | 0 refills | Status: DC
Start: 2020-06-19 — End: 2020-05-11

## 2020-05-11 MED ORDER — FENTANYL 50 MCG/HR TD PT72
1.0000 | MEDICATED_PATCH | TRANSDERMAL | 0 refills | Status: DC
Start: 2020-06-19 — End: 2020-07-06

## 2020-05-11 NOTE — Progress Notes (Signed)
Nursing Pain Medication Assessment:  Safety precautions to be maintained throughout the outpatient stay will include: orient to surroundings, keep bed in low position, maintain call bell within reach at all times, provide assistance with transfer out of bed and ambulation.  Medication Inspection Compliance: Ms. Frysinger did not comply with our request to bring her pills to be counted. She was reminded that bringing the medication bottles, even when empty, is a requirement.  Medication: None brought in. Pill/Patch Count: None available to be counted. Bottle Appearance: No container available. Did not bring bottle(s) to appointment. Filled Date: N/A Last Medication intake:  Today 

## 2020-05-11 NOTE — Progress Notes (Signed)
PROVIDER NOTE: Information contained herein reflects review and annotations entered in association with encounter. Interpretation of such information and data should be left to medically-trained personnel. Information provided to patient can be located elsewhere in the medical record under "Patient Instructions". Document created using STT-dictation technology, any transcriptional errors that may result from process are unintentional.    Patient: Lindsay Austin  Service Category: E/M  Provider: [name], MD  DOB: 07-03-1960  DOS: 05/11/2020  Specialty: Interventional Pain Management  MRN: [id number]  Setting: Ambulatory outpatient  PCP: [name], PA  Type: Established Patient    Referring Provider: [name], PA  Location: Office  Delivery: Face-to-face     HPI  Ms. Menucha Dicesare, a 60 y.o. year old female, is here today because of her Chronic pain syndrome [G89.4]. Ms. Dunigan primary complain today is Back Pain (lower) and Neck Pain Last encounter: My last encounter with her was on 03/08/2020. Pertinent problems: Ms. Benedict has Hx of lumbosacral spine surgery; History of lumbar fusion (L5-S1 fusion); Failed spinal cord stimulator, sequela; Cervical fusion syndrome (C4-C7); Chronic pain syndrome; Major depressive disorder with current active episode; Lumbar spondylosis; Cervical spondylosis without myelopathy; and Spinal stenosis in cervical region on their pertinent problem list. Pain Assessment: Severity of Chronic pain is reported as a 5 /10. Location: Back Lower/both legs. Onset: More than a month ago. Quality: Burning,Shooting,Pressure. Timing: Constant. Modifying factor(s): rest, medications. Vitals:  height is _0  (1.626 m) and weight is 185 lb (83.9 kg). Her temporal temperature is 97.3 F (36.3 C) (abnormal). Her blood pressure is 125/82 and her pulse is 63. Her respiration is 18 and oxygen saturation is 97%.   Reason for encounter: both, medication management and post-procedure  assessment.    Patient presents today for medication management and postprocedure evaluation of her right sacroiliac joint injection and right piriformis injection.  She reports a significant pain relief in her right buttock and right groin pain since her injection.  She is now having increased pain in her low back as well as her neck region.  She is being seen by [facility] for her neck and shoulder pain and had a cortisone injection done there recently.  In regards to her low back pain, reviewed her lumbar MRI from 2021 which shows facet arthritis and degeneration at L3-L4.  We discussed performing a diagnostic facet block at this level to see if helps with her pain.  She continues her fentanyl patch as prescribed.  Finds better relief with this her previous dose of morphine which resulted in adverse side effects as noted in my previous notes due to accumulation of neurotoxic metabolites secondary to compromised kidney function.  Post-Procedure Evaluation  Procedure (03/08/2020):   Type: Diagnostic Sacroiliac Joint Steroid Injection and right piriformis injection #1 & Right Piriformis Injection#1 Region: Inferior Lumbosacral Region Level: PIIS (Posterior Inferior Iliac Spine) Laterality: Right-Side  Sedation: Please see nurses note.  Effectiveness during initial hour after procedure(Ultra-Short Term Relief): 100 %   Local anesthetic used: Long-acting (4-6 hours) Effectiveness: Defined as any analgesic benefit obtained secondary to the administration of local anesthetics. This carries significant diagnostic value as to the etiological location, or anatomical origin, of the pain. Duration of benefit is expected to coincide with the duration of the local anesthetic used.  Effectiveness during initial 4-6 hours after procedure(Short-Term Relief): 100 %   Long-term benefit: Defined as any relief past the pharmacologic duration of the local anesthetics.  Effectiveness past the initial 6 hours after  procedure(Long-Term Relief): 50 %  Current benefits: Defined as benefit that persist at this time.   Analgesia:  50% improved Function: Ms. Kovacich reports improvement in function ROM: Ms. Golembeski reports improvement in ROM   Pharmacotherapy Assessment   Analgesic:  fentanyl patch  50 mcg an hour; 120 MME.   Monitoring: [place] PMP: PDMP reviewed during this encounter.       Pharmacotherapy: No side-effects or adverse reactions reported. Compliance: No problems identified. Effectiveness: Clinically acceptable.  [name], RN  05/11/2020 12:56 PM  Sign when Signing Visit Nursing Pain Medication Assessment:  Safety precautions to be maintained throughout the outpatient stay will include: orient to surroundings, keep bed in low position, maintain call bell within reach at all times, provide assistance with transfer out of bed and ambulation.  Medication Inspection Compliance: Ms. Camero did not comply with our request to bring her pills to be counted. She was reminded that bringing the medication bottles, even when empty, is a requirement.  Medication: None brought in. Pill/Patch Count: None available to be counted. Bottle Appearance: No container available. Did not bring bottle(s) to appointment. Filled Date: N/A Last Medication intake:  Today    UDS:  Summary  Date Value Ref Range Status  11/25/2019 Note  Final    Comment:    ==================================================================== ToxASSURE Select 13 (MW) ==================================================================== Test                             Result       Flag       Units  Drug Present   Morphine                       >6579                   ng/mg creat   Normorphine                    1221                    ng/mg creat    Potential sources of large amounts of morphine in the absence of    codeine include administration of morphine or use of heroin.     Normorphine is an expected metabolite of  morphine.    Hydromorphone                  199                     ng/mg creat    Hydromorphone may be present as a metabolite of morphine;    concentrations of hydromorphone rarely exceed 5% of the morphine    concentration when this is the source of hydromorphone.  ==================================================================== Test                      Result    Flag   Units      Ref Range   Creatinine              152              mg/dL      >=20 ==================================================================== Declared Medications:  Medication list was not provided. ==================================================================== For clinical consultation, please call [phone]. ====================================================================      ROS  Constitutional: Denies any fever or chills Gastrointestinal: No reported hemesis, hematochezia, vomiting, or acute GI distress Musculoskeletal: Right buttock  pain, low back pain Neurological: No reported episodes of acute onset apraxia, aphasia, dysarthria, agnosia, amnesia, paralysis, loss of coordination, or loss of consciousness  Medication Review  Galcanezumab-gnlm, Multiple Minerals-Vitamins, acetaminophen, albuterol, buPROPion, dicyclomine, escitalopram, fentaNYL, methocarbamol, naloxone, olmesartan, ondansetron, pregabalin, rizatriptan, sucralfate, traZODone, and triamcinolone  History Review  Allergy: Ms. Roskelley is allergic to methadone, other, and tape. Drug: Ms. Stitzer  reports no history of drug use. Alcohol:  reports no history of alcohol use. Tobacco:  reports that she has never smoked. She has never used smokeless tobacco. Social: Ms. Leiner  reports that she has never smoked. She has never used smokeless tobacco. She reports that she does not drink alcohol and does not use drugs. Medical:  has a past medical history of Anxiety, Arthritis, Bladder cancer ([place]), Cancer of kidney ([place]), DDD  (degenerative disc disease), cervical, Depression, Failed back syndrome, Fibromyalgia, GERD (gastroesophageal reflux disease), Headache, Heart murmur, Hyperlipemia, Hypertension, IBS (irritable bowel syndrome), Kidney failure, Renal insufficiency, RSD (reflex sympathetic dystrophy), Seizure ([place]), Stomach ulcer, and Trigeminal neuralgia. Surgical: Ms. Yamaguchi  has a past surgical history that includes Transurethral resection of bladder tumor with gyrus (turbt-gyrus) (2012, 2013); Nephroureterectomy (2011); Back surgery (1995); Cholecystectomy; Brain surgery (2004); trigeminal neuralgia (2004); Spinal cord stimulator implant (2004); colonscopy (2006); Posterior cervical fusion/foraminotomy (Bilateral, 07/05/2019); Posterior cervical laminectomy (Bilateral, 07/05/2019); and Breast biopsy (Left, 2017). Family: family history includes Dementia in her father; Diabetes in her mother; Heart disease in her mother; Kidney disease in her father.  Laboratory Chemistry Profile   Renal Lab Results  Component Value Date   BUN 16 08/31/2019   CREATININE 1.32 (H) 08/31/2019   LABCREA 71 08/29/2019   GFRAA 51 (L) 08/31/2019   GFRNONAA 44 (L) 08/31/2019     Hepatic Lab Results  Component Value Date   AST 32 08/29/2019   ALT 12 08/29/2019   ALBUMIN 4.1 08/29/2019   ALKPHOS 66 08/29/2019     Electrolytes Lab Results  Component Value Date   NA 142 08/31/2019   K 3.9 08/31/2019   CL 106 08/31/2019   CALCIUM 8.7 (L) 08/31/2019   MG 2.2 08/31/2019     Bone No results found for: VD25OH, VD125OH2TOT, FU9323FT7, DU2025KY7, 25OHVITD1, 25OHVITD2, 25OHVITD3, TESTOFREE, TESTOSTERONE   Inflammation (CRP: Acute Phase) (ESR: Chronic Phase) No results found for: CRP, ESRSEDRATE, LATICACIDVEN     Note: Above Lab results reviewed.  Recent Imaging Review   CLINICAL DATA:  Chronic right-sided low back pain with right hip and thigh pain. Recent falls.  EXAM: MRI LUMBAR SPINE WITHOUT  CONTRAST  TECHNIQUE: Multiplanar, multisequence MR imaging of the lumbar spine was performed. No intravenous contrast was administered.  COMPARISON:  Radiography 04/29/2019.  MRI 06/09/2018.  FINDINGS: Segmentation: 5 lumbar type vertebral bodies as numbered previously.  Alignment: Normal except for 1 or 2 mm of degenerative anterolisthesis at L4-5.  Vertebrae: Benign appearing marrow foci within L2, L3 and L4, many of which represent hemangiomas, unchanged since 1 year ago and consistent with benign findings.  Conus medullaris and cauda equina: Conus extends to the L1 level. Conus and cauda equina appear normal.  Paraspinal and other soft tissues: Absent left kidney.  Disc levels:  No significant finding at L2-3 or above.  L3-4: Mild bulging of the disc.  No compressive stenosis.  L4-5: Bilateral facet osteoarthritis with 1 or 2 mm of anterolisthesis. Bulging of the disc. Stenosis of both lateral recesses but without distinct neural compression. Findings could certainly relate to low back pain or referred facet syndrome  pain.  L5-S1: Previous posterior decompression, diskectomy and fusion procedure. No evidence of stenosis. 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 No change.  IMPRESSION: L4-5: Bilateral facet arthropathy with 1 or 2 mm of anterolisthesis. Bulging of the disc. Narrowing of the subarticular lateral recesses that could possibly be symptomatic. The facet arthropathy could also be a cause of back pain or referred facet syndrome pain. Findings appear similar the study of last year.  No evidence of fracture or other recent traumatic finding.   Physical Exam  General appearance: Well nourished, well developed, and well hydrated. In no apparent acute distress Mental status: Alert, oriented x 3 (person, place, & time)       Respiratory: No evidence of acute respiratory distress Eyes: PERLA Vitals: BP 125/82   Pulse 63   Temp (!)  97.3 F (36.3 C) (Temporal)   Resp 18   Ht _0  (1.626 m)   Wt 185 lb (83.9 kg)   SpO2 97%   BMI 31.76 kg/m  BMI: Estimated body mass index is 31.76 kg/m as calculated from the following:   Height as of this encounter: _1  (1.626 m).   Weight as of this encounter: 185 lb (83.9 kg). Ideal: Ideal body weight: 54.7 kg (120 lb 9.5 oz) Adjusted ideal body weight: 66.4 kg (146 lb 5.7 oz)   Lumbar Spine Area Exam  Skin & Axial Inspection: Well healed scar from previous spine surgery detected Alignment: Symmetrical Functional ROM: Pain restricted ROM affecting primarily the right Stability: No instability detected Muscle Tone/Strength: Functionally intact. No obvious neuro-muscular anomalies detected. Sensory (Neurological): Musculoskeletal pain pattern facet mediated pattern Palpation: No palpable anomalies       Provocative Tests: Hyperextension/rotation test:  Positive for facet pain    Lumbar quadrant test (Kemp's test): deferred today       Lateral bending test: (+) due to pain. Patrick's Maneuver: (+) for right-sided S-I arthralgia, improved after treatment             FABER* test: (+) for right-sided S-I arthralgia             S-I anterior distraction/compression test: (+) Right-sided (+)   S-I arthralgia/arthropathy S-I Thigh-thrust test: (+)   S-I arthralgia/arthropathy, improved after treatment S-I Gaenslen's test: deferred today         *(Flexion, ABduction and External Rotation)  Gait & Posture Assessment  Ambulation:Patient came in today in a wheel chair Gait:Limited. Using assistive device to ambulate Posture:Difficulty standing up straight, due to pain Lower Extremity Exam    Side:Right lower extremity  Side:Left lower extremity  Stability:No instability observed  Stability:No instability observed  Skin & Extremity Inspection:Skin color, temperature, and hair growth are WNL. No peripheral edema or cyanosis. No masses, redness,  swelling, asymmetry, or associated skin lesions. No contractures.  Skin & Extremity Inspection:Skin color, temperature, and hair growth are WNL. No peripheral edema or cyanosis. No masses, redness, swelling, asymmetry, or associated skin lesions. No contractures.  Functional BWL:SLHT restricted ROMfor all joints of the lower extremity   Functional DSK:AJGO restricted ROMfor all joints of the lower extremity   Muscle Tone/Strength:Functionally intact. No obvious neuro-muscular anomalies detected.  Muscle Tone/Strength:Functionally intact. No obvious neuro-muscular anomalies detected.  Sensory (Neurological):Unimpaired  Sensory (Neurological):Unimpaired  DTR: Patellar:deferred today Achilles:deferred today Plantar:deferred today  DTR: Patellar:deferred today Achilles:deferred today Plantar:deferred today  Palpation:No palpable anomalies  Palpation:No palpable anomalies    Assessment   Status Diagnosis  Controlled Controlled Controlled 1. Chronic pain syndrome   2. Piriformis  syndrome of right side   3. Cervical facet joint syndrome   4. Sacroiliac joint dysfunction of right side   5. Cervical fusion syndrome (C4-T2)   6. RSD (reflex sympathetic dystrophy)   7. Lumbar spondylosis   8. Spinal stenosis in cervical region   9. History of lumbar fusion   10. Cervical spondylosis without myelopathy   11. Failed spinal cord stimulator, sequela   12. Contusion of right hip, sequela   13. Lumbar facet arthropathy        Plan of Care  Ms. Dondrea Clendenin has a current medication list which includes the following long-term medication(s): albuterol, bupropion, dicyclomine, escitalopram, olmesartan, rizatriptan, sucralfate, and trazodone.  -Attempted to send fentanyl patch refill electronically but E-prescribing failed on multiple transmissions as noted below.  Patient was given a paper prescription for Fentanyl patch for 2 months. -Continue  acetaminophen 1000 mg twice daily as needed, Robaxin 500 mg twice daily as needed, Lyrica 500 mg 3 times daily. -Repeat UDS -Repeat right SI joint injection: Right piriformis injection as needed return for pain, as needed order placed below -Plan for diagnostic L3-L4 lumbar facet medial branch nerve block for lumbar facet arthropathy  Pharmacotherapy (Medications Ordered): Meds ordered this encounter  Medications  . DISCONTD: fentaNYL (DURAGESIC) 50 MCG/HR    Sig: Place 1 patch onto the skin every 3 (three) days. Most last 30 days.    Dispense:  10 patch    Refill:  0    [place] STOP ACT - Not applicable. Fill one day early if pharmacy is closed on scheduled refill date.  Marland Kitchen DISCONTD: fentaNYL (DURAGESIC) 50 MCG/HR    Sig: Place 1 patch onto the skin every 3 (three) days. Most last 30 days.    Dispense:  10 patch    Refill:  0    [place] STOP ACT - Not applicable. Fill one day early if pharmacy is closed on scheduled refill date.  Marland Kitchen DISCONTD: fentaNYL (DURAGESIC) 50 MCG/HR    Sig: Place 1 patch onto the skin every 3 (three) days. Most last 30 days.    Dispense:  10 patch    Refill:  0    [place] STOP ACT - Not applicable. Fill one day early if pharmacy is closed on scheduled refill date.  Marland Kitchen DISCONTD: fentaNYL (DURAGESIC) 50 MCG/HR    Sig: Place 1 patch onto the skin every 3 (three) days. Most last 30 days.    Dispense:  10 patch    Refill:  0    [place] STOP ACT - Not applicable. Fill one day early if pharmacy is closed on scheduled refill date.  Marland Kitchen DISCONTD: fentaNYL (DURAGESIC) 50 MCG/HR    Sig: Place 1 patch onto the skin every 3 (three) days. Most last 30 days.    Dispense:  10 patch    Refill:  0    [place] STOP ACT - Not applicable. Fill one day early if pharmacy is closed on scheduled refill date.  Marland Kitchen DISCONTD: fentaNYL (DURAGESIC) 50 MCG/HR    Sig: Place 1 patch onto the skin every 3 (three) days. Most last 30 days.    Dispense:  10 patch    Refill:  0     STOP ACT - Not applicable. Fill one day  early if pharmacy is closed on scheduled refill date.  . fentaNYL (DURAGESIC) 50 MCG/HR    Sig: Place 1 patch onto the skin every 3 (three) days. Most last 30 days.    Dispense:  10 patch  Refill:  0    [place] STOP ACT - Not applicable. Fill one day early if pharmacy is closed on scheduled refill date.  . fentaNYL (DURAGESIC) 50 MCG/HR    Sig: Place 1 patch onto the skin every 3 (three) days. Most last 30 days.    Dispense:  10 patch    Refill:  0    [place] STOP ACT - Not applicable. Fill one day early if pharmacy is closed on scheduled refill date.   Orders:  Orders Placed This Encounter  Procedures  . SACROILIAC JOINT INJECTION    Scheduling timeframe: (PRN procedure) Ms. Sperry will call when needed. Clinical indication: Low back pain, w/ or w/o groin pain. Sacroiliac joint pain Sedation: Usually done with sedation. (May be done without sedation if so desired by patient.) Requirements: NPO x 8 hrs.; Driver; Stop blood thinners.    Standing Status:   Standing    Number of Occurrences:   1    Standing Expiration Date:   05/11/2021    Scheduling Instructions:     Side:Right     Sedation: Patient's choice.    Order Specific Question:   Where will this procedure be performed?    Answer:   [facility]  . TRIGGER POINT INJECTION    Area: Buttocks region (gluteal area) Indications: Piriformis muscle pain;  Right piriformis-syndrome; piriformis muscle spasms (X32.440). CPT code: 20552    Standing Status:   Standing    Number of Occurrences:   2    Standing Expiration Date:   11/08/2020    Scheduling Instructions:     Type: Myoneural block (TPI) of piriformis muscle.     Side:  RIGHT     Sedation: Patient's choice.     PRN    Order Specific Question:   Where will this procedure be performed?    Answer:   [facility]  . LUMBAR FACET(MEDIAL BRANCH NERVE BLOCK) MBNB    Standing Status:   Future    Standing Expiration Date:   06/11/2020    Scheduling Instructions:      Procedure: Lumbar facet block (AKA.: Lumbosacral medial branch nerve block)     Side: Bilateral     Level:L4-5,  Facets (L3, L4, L5,Medial Branch Nerves)     Sedation: without     Timeframe: ASAA    Order Specific Question:   Where will this procedure be performed?    Answer:   [facility]  . ToxASSURE Select 13 (MW), Urine    Volume: 30 ml(s). Minimum 3 ml of urine is needed. Document temperature of fresh sample. Indications: Long term (current) use of opiate analgesic (N02.725)    Order Specific Question:   Release to patient    Answer:   Immediate   Follow-up plan:   Return in about 2 weeks (around 05/25/2020) for L3, 4 Fcts , without sedation.   Recent Visits Date Type Provider Dept  03/08/20 Procedure visit [name], MD [facility]  02/15/20 Office Visit [name], MD [facility]  Showing recent visits within past 90 days and meeting all other requirements Today's Visits Date Type Provider Dept  05/11/20 Office Visit [name], MD [facility]  Showing today's visits and meeting all other requirements Future Appointments No visits were found meeting these conditions. Showing future appointments within next 90 days and meeting all other requirements  I discussed the assessment and treatment plan with the patient. The patient was provided an opportunity to ask  questions and all were answered. The patient agreed with the plan and demonstrated an understanding of the instructions.  Patient advised to call back or seek an in-person evaluation if the symptoms or condition worsens.  Duration of encounter: 30 minutes.  Note by: [name], MD Date: 05/11/2020; Time: 1:15 PM

## 2020-05-18 ENCOUNTER — Other Ambulatory Visit: Payer: Self-pay

## 2020-05-18 ENCOUNTER — Encounter: Payer: Self-pay | Admitting: Urology

## 2020-05-18 ENCOUNTER — Ambulatory Visit (INDEPENDENT_AMBULATORY_CARE_PROVIDER_SITE_OTHER): Payer: Medicaid Other | Admitting: Urology

## 2020-05-18 VITALS — BP 131/77 | HR 72 | Ht 64.0 in | Wt 185.0 lb

## 2020-05-18 DIAGNOSIS — R310 Gross hematuria: Secondary | ICD-10-CM

## 2020-05-18 DIAGNOSIS — C679 Malignant neoplasm of bladder, unspecified: Secondary | ICD-10-CM | POA: Diagnosis not present

## 2020-05-18 LAB — URINALYSIS, COMPLETE
Bilirubin, UA: NEGATIVE
Glucose, UA: NEGATIVE
Ketones, UA: NEGATIVE
Leukocytes,UA: NEGATIVE
Nitrite, UA: NEGATIVE
Protein,UA: NEGATIVE
RBC, UA: NEGATIVE
Specific Gravity, UA: 1.02 (ref 1.005–1.030)
Urobilinogen, Ur: 0.2 mg/dL (ref 0.2–1.0)
pH, UA: 8.5 — ABNORMAL HIGH (ref 5.0–7.5)

## 2020-05-18 LAB — MICROSCOPIC EXAMINATION: RBC, Urine: NONE SEEN /hpf (ref 0–2)

## 2020-05-18 LAB — TOXASSURE SELECT 13 (MW), URINE

## 2020-05-18 NOTE — Progress Notes (Signed)
Cystoscopy Procedure Note:  Indication: History of urothelial cell carcinoma  2011: Left nephroureterectomy for T1 high-grade urothelial cell carcinoma, negative margins ([facility]) 2012 and 2013: TURBT at [facility], path unavailable [facility]  After informed consent and discussion of the procedure and its risks, Lindsay Austin was positioned and prepped in the standard fashion. Cystoscopy was performed with a flexible cystoscope. The urethra, bladder neck and entire bladder was visualized in a standard fashion.The left ureteral orifice was surgically absent, right ureteral orifices orthotopic.  Mucosa grossly normal without any abnormalities.  Retroflexion normal.  Imaging: CT dated 05/18/2019 with No evidence of disease recurrence.  Stable 1.2 cm midline omental lesion unchanged from prior and likely benign.  Findings: Normal cystoscopy  Plan: -RTC 1 year for cystoscopy, repeat CTU January 2023 -We discussed that we typically would not continue surveillance after 10 years, but with her young age, it may be reasonable to continue cystoscopy every other year and ultrasound  She has had some intermittent right-sided flank pain, and a renal ultrasound was ordered to evaluate for any hydronephrosis or other renal abnormalities.  She has chronic pain at baseline.  She also has some urgency and incontinence overnight without realizing she is leaking.  She has been evaluated by Dr. Matilde Sprang and failed Myrbetriq as well as anticholinergics, and unfortunately he did not feel there were other options he could offer for her incontinence.  [name], MD 05/18/2020

## 2020-05-22 ENCOUNTER — Encounter: Payer: Self-pay | Admitting: Student in an Organized Health Care Education/Training Program

## 2020-05-22 ENCOUNTER — Ambulatory Visit
Admission: RE | Admit: 2020-05-22 | Discharge: 2020-05-22 | Disposition: A | Discharge status: Home / Self Care | Payer: Medicaid Other | Source: Ambulatory Visit | Attending: Student in an Organized Health Care Education/Training Program | Admitting: Student in an Organized Health Care Education/Training Program

## 2020-05-22 ENCOUNTER — Other Ambulatory Visit: Payer: Self-pay

## 2020-05-22 ENCOUNTER — Ambulatory Visit (HOSPITAL_BASED_OUTPATIENT_CLINIC_OR_DEPARTMENT_OTHER): Payer: Medicaid Other | Admitting: Student in an Organized Health Care Education/Training Program

## 2020-05-22 DIAGNOSIS — G894 Chronic pain syndrome: Secondary | ICD-10-CM

## 2020-05-22 DIAGNOSIS — M47816 Spondylosis without myelopathy or radiculopathy, lumbar region: Secondary | ICD-10-CM | POA: Insufficient documentation

## 2020-05-22 MED ORDER — ROPIVACAINE HCL 2 MG/ML IJ SOLN
9.0000 mL | Freq: Once | INTRAMUSCULAR | Status: AC
  Administered 2020-05-22: 10 mL via PERINEURAL

## 2020-05-22 MED ORDER — DEXAMETHASONE SODIUM PHOSPHATE 10 MG/ML IJ SOLN
10.0000 mg | Freq: Once | INTRAMUSCULAR | Status: AC
  Administered 2020-05-22: 10 mg

## 2020-05-22 MED ORDER — DEXAMETHASONE SODIUM PHOSPHATE 10 MG/ML IJ SOLN
INTRAMUSCULAR | Status: AC
  Filled 2020-05-22: qty 2

## 2020-05-22 MED ORDER — LIDOCAINE HCL 2 % IJ SOLN
20.0000 mL | Freq: Once | INTRAMUSCULAR | Status: AC
  Administered 2020-05-22: 400 mg
  Filled 2020-05-22: qty 20

## 2020-05-22 MED ORDER — ROPIVACAINE HCL 2 MG/ML IJ SOLN
INTRAMUSCULAR | Status: AC
  Filled 2020-05-22: qty 20

## 2020-05-22 MED ORDER — LIDOCAINE HCL (PF) 2 % IJ SOLN
INTRAMUSCULAR | Status: AC
  Filled 2020-05-22: qty 10

## 2020-05-22 NOTE — Progress Notes (Signed)
Safety precautions to be maintained throughout the outpatient stay will include: orient to surroundings, keep bed in low position, maintain call bell within reach at all times, provide assistance with transfer out of bed and ambulation.  

## 2020-05-22 NOTE — Progress Notes (Signed)
PROVIDER NOTE: Information contained herein reflects review and annotations entered in association with encounter. Interpretation of such information and data should be left to medically-trained personnel. Information provided to patient can be located elsewhere in the medical record under "Patient Instructions". Document created using STT-dictation technology, any transcriptional errors that may result from process are unintentional.    Patient: Lindsay Austin  Service Category: Procedure  Provider: [name], MD  DOB: Dec 14, 1960  DOS: 05/22/2020  Location: [facility]  MRN: [id number]  Setting: Ambulatory - outpatient  Referring Provider: [name], MD  Type: Established Patient  Specialty: Interventional Pain Management  PCP: [name], [place]   Primary Reason for Visit: Interventional Pain Management Treatment. CC: Back Pain (low)  Procedure:          Anesthesia, Analgesia, Anxiolysis:  Type: Lumbar Facet, Medial Branch Block(s) #1  Primary Purpose: Diagnostic Region: Posterolateral Lumbosacral Spine Level:  L3, L4, L5,Medial Branch Level(s). Injecting these levels blocks the L3-4, L4-5, lumbar facet joints. Laterality: Bilateral  Type: Local Anesthesia  Local Anesthetic: Lidocaine 1-2%  Position: Prone   Indications: 1. Chronic pain syndrome   2. Lumbar spondylosis   3. Lumbar facet arthropathy    Pain Score: Pre-procedure: 5 /10 Post-procedure: 3 /10   Pre-op H&P Assessment:  Lindsay Austin is a 60 y.o. (year old), female patient, seen today for interventional treatment. 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 [name] has a current medication list which includes the following prescription(s): acetaminophen, albuterol, bupropion, dicyclomine, escitalopram, fentanyl, [START ON 06/19/2020] fentanyl, emgality, methocarbamol, multiple minerals-vitamins, naloxone, olmesartan, ondansetron, pregabalin, rizatriptan, sucralfate, trazodone, and triamcinolone. Her primarily concern today is the Back Pain (low)  Initial Vital Signs:  Pulse/HCG Rate: 62ECG Heart Rate: (!) 55 Temp: 97.6 F (36.4 C) Resp: 16 BP: 126/64 SpO2: 97 %  BMI: Estimated body mass index is 31.76 kg/m as calculated from the following:   Height as of this encounter: 5\' 4"  (1.626 m).   Weight as of this encounter: 185 lb (83.9 kg).  Risk Assessment: Allergies: Reviewed. She is allergic to methadone, other, and tape.  Allergy Precautions: None required Coagulopathies: Reviewed. None identified.  Blood-thinner therapy: None at this time Active Infection(s): Reviewed. None identified. Lindsay Austin is afebrile  Site Confirmation: Lindsay Austin was asked to confirm the procedure and laterality before marking the site Procedure checklist: Completed Consent: Before the procedure and under the influence of no sedative(s), amnesic(s), or anxiolytics, the patient was informed of the treatment options, risks and possible complications. To fulfill our ethical and legal obligations, as recommended by the American Medical Association's Code of Ethics, I have informed the patient of my clinical impression; the nature and purpose of the treatment or procedure; the risks, benefits, and possible complications of the intervention; the alternatives, including doing nothing; the risk(s) and benefit(s) of the alternative treatment(s) or procedure(s); and the risk(s) and benefit(s) of doing nothing. The patient was provided information about the general risks and possible complications associated with the procedure.  These may include, but are not limited to: failure to achieve desired goals, infection, bleeding, organ or nerve damage, allergic reactions, paralysis, and death. In addition, the patient was informed of those risks and complications associated to [facility]  procedures, such as failure to decrease pain; infection (i.e.: Meningitis, epidural or intraspinal abscess); bleeding (i.e.: epidural hematoma, subarachnoid hemorrhage, or any other type of intraspinal or peri-dural bleeding); organ or nerve damage (i.e.: Any type of peripheral nerve, nerve root, or spinal cord injury) with subsequent damage to sensory, motor, and/or autonomic systems, resulting in permanent pain, numbness, and/or weakness of one or several areas of the body; allergic reactions; (i.e.: anaphylactic reaction); and/or death. Furthermore, the patient was informed of those risks and complications associated with the medications. These include, but are not limited to: allergic reactions (i.e.: anaphylactic or anaphylactoid reaction(s)); adrenal axis suppression; blood sugar elevation that in diabetics may result in ketoacidosis or comma; water retention that in patients with history of congestive heart failure may result in shortness of breath, pulmonary edema, and decompensation with resultant heart failure; weight gain; swelling or edema; medication-induced neural toxicity; particulate matter embolism and blood vessel occlusion with resultant organ, and/or nervous system infarction; and/or aseptic necrosis of one or more joints. Finally, the patient was informed that Medicine is not an exact science; therefore, there is also the possibility of unforeseen or unpredictable risks and/or possible complications that may result in a catastrophic outcome. The patient indicated having understood very clearly. We have given the patient no guarantees and we have made no promises. Enough time was given to the patient to ask questions, all of which were  answered to the patient's satisfaction. Lindsay Austin has indicated that she wanted to continue with the procedure. Attestation: I, the ordering provider, attest that I have discussed with the patient the benefits, risks, side-effects, alternatives, likelihood of achieving goals, and potential problems during recovery for the procedure that I have provided informed consent. Date  Time: 05/22/2020 10:24 AM  Pre-Procedure Preparation:  Monitoring: As per clinic protocol. Respiration, ETCO2, SpO2, BP, heart rate and rhythm monitor placed and checked for adequate function Safety Precautions: Patient was assessed for positional comfort and pressure points before starting the procedure. Time-out: I initiated and conducted the "Time-out" before starting the procedure, as per protocol. The patient was asked to participate by confirming the accuracy of the "Time Out" information. Verification of the correct person, site, and procedure were performed and confirmed by me, the nursing staff, and the patient. "Time-out" conducted as per Joint Commission's Universal Protocol (UP.01.01.01). Time: 1125  Description of Procedure:          Laterality: Bilateral. The procedure was performed in identical fashion on both sides. Levels:  L3, L4, L5,  Medial Branch Level(s) Area Prepped: Posterior Lumbosacral Region DuraPrep (Iodine Povacrylex [0.7% available iodine] and Isopropyl Alcohol, 74% w/w) Safety Precautions: Aspiration looking for blood return was conducted prior to all injections. At no point did we inject any substances, as a needle was being advanced. Before injecting, the patient was told to immediately notify me if she was experiencing any new onset of "ringing in the ears, or metallic taste in the mouth". No attempts were made at seeking any paresthesias. Safe injection practices and needle disposal techniques used. Medications properly checked for expiration dates. SDV (single dose vial) medications used. After  the completion of the procedure, all disposable equipment used was discarded in the proper designated medical waste containers. Local Anesthesia: Protocol guidelines were followed. The patient was positioned over the fluoroscopy table. The area was prepped in the usual manner. The time-out was completed. The target area was identified using fluoroscopy. A 12-in long, straight, sterile hemostat was used with fluoroscopic guidance to locate the  targets for each level blocked. Once located, the skin was marked with an approved surgical skin marker. Once all sites were marked, the skin (epidermis, dermis, and hypodermis), as well as deeper tissues (fat, connective tissue and muscle) were infiltrated with a small amount of a short-acting local anesthetic, loaded on a 10cc syringe with a 25G, 1.5-in  Needle. An appropriate amount of time was allowed for local anesthetics to take effect before proceeding to the next step. Local Anesthetic: Lidocaine 2.0% The unused portion of the local anesthetic was discarded in the proper designated containers. Technical explanation of process:  L3 Medial Branch Nerve Block (MBB): The target area for the L3 medial branch is at the junction of the postero-lateral aspect of the superior articular process and the superior, posterior, and medial edge of the transverse process of L4. Under fluoroscopic guidance, a Quincke needle was inserted until contact was made with os over the superior postero-lateral aspect of the pedicular shadow (target area). After negative aspiration for blood, 1.5-2 mL of the nerve block solution was injected without difficulty or complication. The needle was removed intact. L4 Medial Branch Nerve Block (MBB): The target area for the L4 medial branch is at the junction of the postero-lateral aspect of the superior articular process and the superior, posterior, and medial edge of the transverse process of L5. Under fluoroscopic guidance, a Quincke needle was  inserted until contact was made with os over the superior postero-lateral aspect of the pedicular shadow (target area). After negative aspiration for blood,1.5-2 mL of the nerve block solution was injected without difficulty or complication. The needle was removed intact. L5 Medial Branch Nerve Block (MBB): The target area for the L5 medial branch is at the junction of the postero-lateral aspect of the superior articular process and the superior, posterior, and medial edge of the sacral ala. Under fluoroscopic guidance, a Quincke needle was inserted until contact was made with os over the superior postero-lateral aspect of the pedicular shadow (target area). After negative aspiration for blood, 1.5-55mL of the nerve block solution was injected without difficulty or complication. The needle was removed intact.  Nerve block solution: 10 cc solution made of 8 cc of 0.2% ropivacaine, 2 cc of Decadron, 10 mg/cc.  1.5 to 2 cc injected at each level above bilaterally.  The unused portion of the solution was discarded in the proper designated containers. Procedural Needles: 22-gauge, 3.5-inch, Quincke needles used for all levels.  Once the entire procedure was completed, the treated area was cleaned, making sure to leave some of the prepping solution back to take advantage of its long term bactericidal properties.   Illustration of the posterior view of the lumbar spine and the posterior neural structures. Laminae of L2 through S1 are labeled. DPRL5, dorsal primary ramus of L5; DPRS1, dorsal primary ramus of S1; DPR3, dorsal primary ramus of L3; FJ, facet (zygapophyseal) joint L3-L4; I, inferior articular process of L4; LB1, lateral branch of dorsal primary ramus of L1; IAB, inferior articular branches from L3 medial branch (supplies L4-L5 facet joint); IBP, intermediate branch plexus; MB3, medial branch of dorsal primary ramus of L3; NR3, third lumbar nerve root; S, superior articular process of L5; SAB, superior  articular branches from L4 (supplies L4-5 facet joint also); TP3, transverse process of L3.  Vitals:   05/22/20 1036 05/22/20 1128 05/22/20 1133 05/22/20 1138  BP: 126/64 123/77 126/78 115/84  Pulse: 62     Resp:  12 13 12   Temp: (!) 97.1 F (36.2 C)  SpO2: 97% 100% 99% 100%  Weight:      Height:         Start Time: 1125 hrs. End Time: 1128 hrs.  Imaging Guidance (Spinal):          Type of Imaging Technique: Fluoroscopy Guidance (Spinal) Indication(s): Assistance in needle guidance and placement for procedures requiring needle placement in or near specific anatomical locations not easily accessible without such assistance. Exposure Time: Please see nurses notes. Contrast: None used. Fluoroscopic Guidance: I was personally present during the use of fluoroscopy. "Tunnel Vision Technique" used to obtain the best possible view of the target area. Parallax error corrected before commencing the procedure. "Direction-depth-direction" technique used to introduce the needle under continuous pulsed fluoroscopy. Once target was reached, antero-posterior, oblique, and lateral fluoroscopic projection used confirm needle placement in all planes. Images permanently stored in EMR. Interpretation: No contrast injected. I personally interpreted the imaging intraoperatively. Adequate needle placement confirmed in multiple planes. Permanent images saved into the patient's record.  Antibiotic Prophylaxis:   Anti-infectives (From admission, onward)   None     Indication(s): None identified  Post-operative Assessment:  Post-procedure Vital Signs:  Pulse/HCG Rate: 62(!) 56 Temp: (!) 97.1 F (36.2 C) Resp: 12 BP: 115/84 SpO2: 100 %  EBL: None  Complications: No immediate post-treatment complications observed by team, or reported by patient.  Note: Mild numbness of lower extremity, patient was monitored in recovery room and numbness improved.  She walked down the hallway with full strength  without any issues with balance or stability.  She was discharged home.  Comments:  No additional relevant information.  Plan of Care  Orders:  Orders Placed This Encounter  Procedures  . DG PAIN CLINIC C-ARM 1-60 MIN NO REPORT    Intraoperative interpretation by procedural physician at [facility].    Standing Status:   Standing    Number of Occurrences:   1    Order Specific Question:   Reason for exam:    Answer:   Assistance in needle guidance and placement for procedures requiring needle placement in or near specific anatomical locations not easily accessible without such assistance.   Chronic Opioid Analgesic:   fentanyl patch  50 mcg an hour; 120 MME.   Medications ordered for procedure: Meds ordered this encounter  Medications  . lidocaine (XYLOCAINE) 2 % (with pres) injection 400 mg  . ropivacaine (PF) 2 mg/mL (0.2%) (NAROPIN) injection 9 mL  . ropivacaine (PF) 2 mg/mL (0.2%) (NAROPIN) injection 9 mL  . dexamethasone (DECADRON) injection 10 mg  . dexamethasone (DECADRON) injection 10 mg   Medications administered: We administered lidocaine, ropivacaine (PF) 2 mg/mL (0.2%), ropivacaine (PF) 2 mg/mL (0.2%), dexamethasone, and dexamethasone.  See the medical record for exact dosing, route, and time of administration.  Follow-up plan:   Return in about 3 weeks (around 06/12/2020) for PP  VV.    Recent Visits Date Type Provider Dept  05/11/20 Office Visit [name], MD [facility]  03/08/20 Procedure visit [name], MD [facility]  Showing recent visits within past 90 days and meeting all other requirements Today's Visits Date Type Provider Dept  05/22/20 Procedure visit [name], MD [facility]  Showing today's visits and meeting all other requirements Future Appointments Date Type Provider Dept  06/08/20 Appointment [name], MD [facility]  07/06/20 Appointment [name], MD [facility]  Showing future appointments within next 90 days and meeting all other requirements  Disposition: Discharge  home  Discharge (Date  Time): 05/22/2020; 1145 hrs.   Primary Care Physician: [name], [place] Location: [facility] Note by: [name], MD Date: 05/22/2020; Time: 11:57 AM  Disclaimer:  Medicine is not an exact science. The only guarantee in medicine is that nothing is guaranteed. It is important to note that the decision to proceed with this intervention was based on the information collected from the patient. The Data and conclusions were drawn from the patient's questionnaire, the interview, and the physical examination. Because the information was provided in large part by the patient, it cannot be guaranteed that it has not been purposely or unconsciously manipulated. Every effort has been made to obtain as much relevant data as possible for this evaluation. It is important to note that the conclusions that lead to this procedure are derived in large part from the available data. Always take into account that the treatment will also be dependent on availability of resources and existing treatment guidelines, considered by other Pain Management Practitioners as being common knowledge and practice, at the time of the intervention. For Medico-Legal purposes, it is also important to point out that variation in procedural techniques and pharmacological choices are the acceptable norm. The indications, contraindications, technique, and results of the above procedure should only be interpreted and judged by a Board-Certified Interventional Pain Specialist with extensive familiarity and expertise in the same exact procedure and technique.

## 2020-05-22 NOTE — Patient Instructions (Signed)

## 2020-05-23 ENCOUNTER — Telehealth: Payer: Self-pay

## 2020-05-23 NOTE — Telephone Encounter (Signed)
  Called pp. No answer. Left message and instructed to call if needed.

## 2020-06-02 ENCOUNTER — Other Ambulatory Visit: Payer: Self-pay

## 2020-06-02 ENCOUNTER — Ambulatory Visit
Admission: RE | Admit: 2020-06-02 | Discharge: 2020-06-02 | Disposition: A | Discharge status: Home / Self Care | Payer: Medicaid Other | Source: Ambulatory Visit | Attending: Urology | Admitting: Urology

## 2020-06-02 DIAGNOSIS — R310 Gross hematuria: Secondary | ICD-10-CM | POA: Diagnosis not present

## 2020-06-06 ENCOUNTER — Telehealth: Payer: Self-pay

## 2020-06-06 NOTE — Telephone Encounter (Signed)
-----   Message from Billey Co, MD sent at 06/05/2020 11:27 AM EST ----- Normal renal US, keep follow up as scheduled  [name], MD 06/05/2020

## 2020-06-06 NOTE — Telephone Encounter (Signed)
Called pt informed her of the information below. Pt gave verbal understanding.  

## 2020-06-08 ENCOUNTER — Other Ambulatory Visit: Payer: Self-pay

## 2020-06-08 ENCOUNTER — Ambulatory Visit
Payer: Medicaid Other | Attending: Student in an Organized Health Care Education/Training Program | Admitting: Student in an Organized Health Care Education/Training Program

## 2020-06-08 ENCOUNTER — Encounter: Payer: Self-pay | Admitting: Student in an Organized Health Care Education/Training Program

## 2020-06-08 DIAGNOSIS — M47816 Spondylosis without myelopathy or radiculopathy, lumbar region: Secondary | ICD-10-CM

## 2020-06-08 DIAGNOSIS — G894 Chronic pain syndrome: Secondary | ICD-10-CM

## 2020-06-08 DIAGNOSIS — M533 Sacrococcygeal disorders, not elsewhere classified: Secondary | ICD-10-CM

## 2020-06-08 DIAGNOSIS — M47812 Spondylosis without myelopathy or radiculopathy, cervical region: Secondary | ICD-10-CM

## 2020-06-08 DIAGNOSIS — G5701 Lesion of sciatic nerve, right lower limb: Secondary | ICD-10-CM

## 2020-06-08 DIAGNOSIS — Q761 Klippel-Feil syndrome: Secondary | ICD-10-CM

## 2020-06-08 IMAGING — CR DG HIP (WITH OR WITHOUT PELVIS) 2-3V*R*
3 series · 3 of 3 positions shown · non-contrast
Comparison: None.

CLINICAL DATA: Acute right hip pain.

EXAM:
DG HIP (WITH OR WITHOUT PELVIS) 2-3V RIGHT

[pelvis ap]
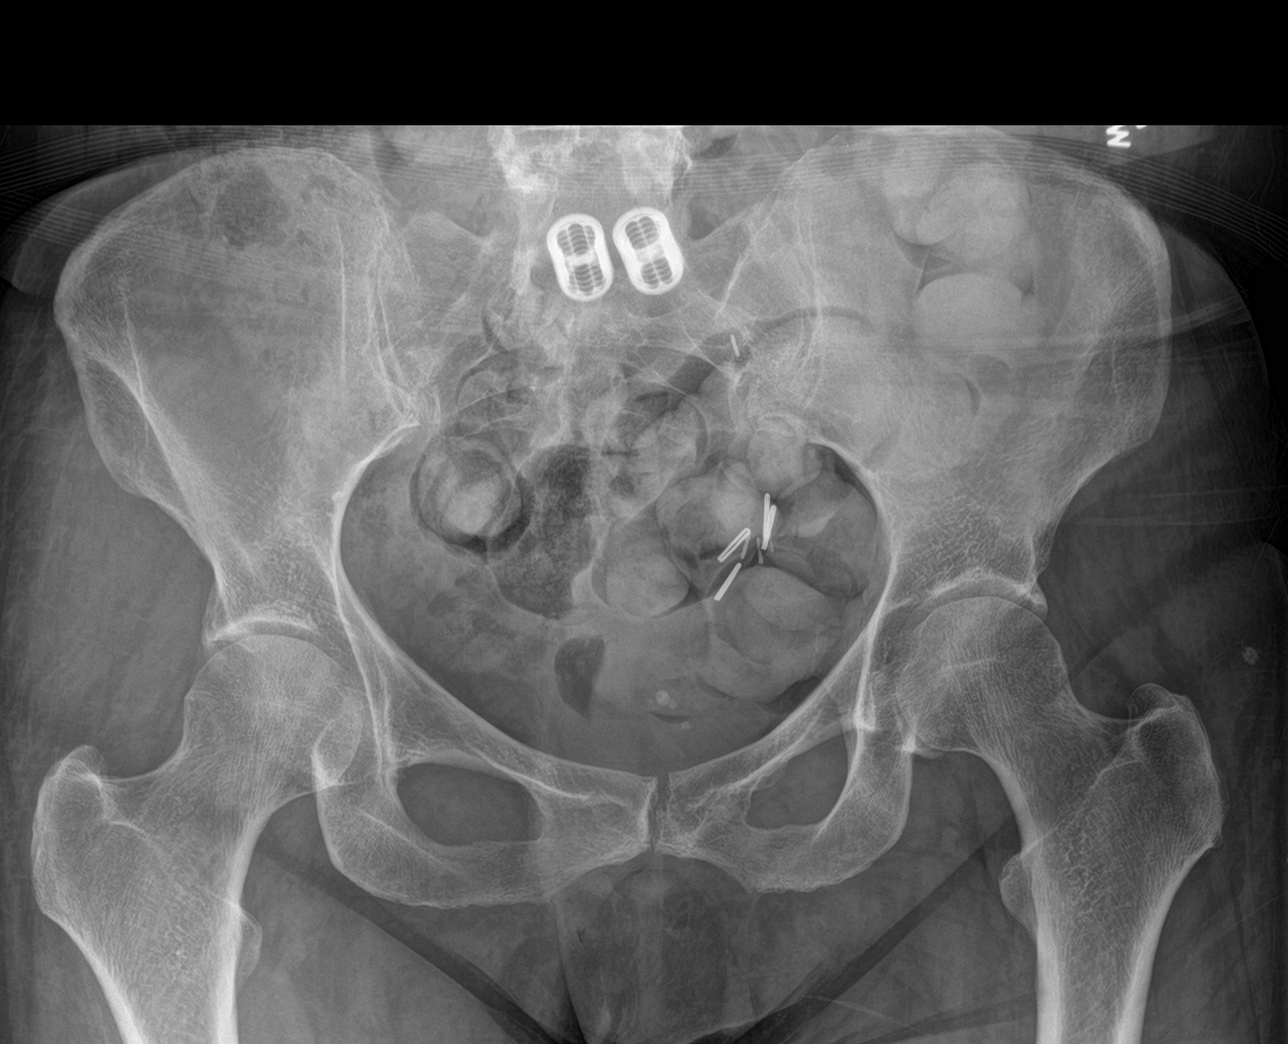

[hip ap]
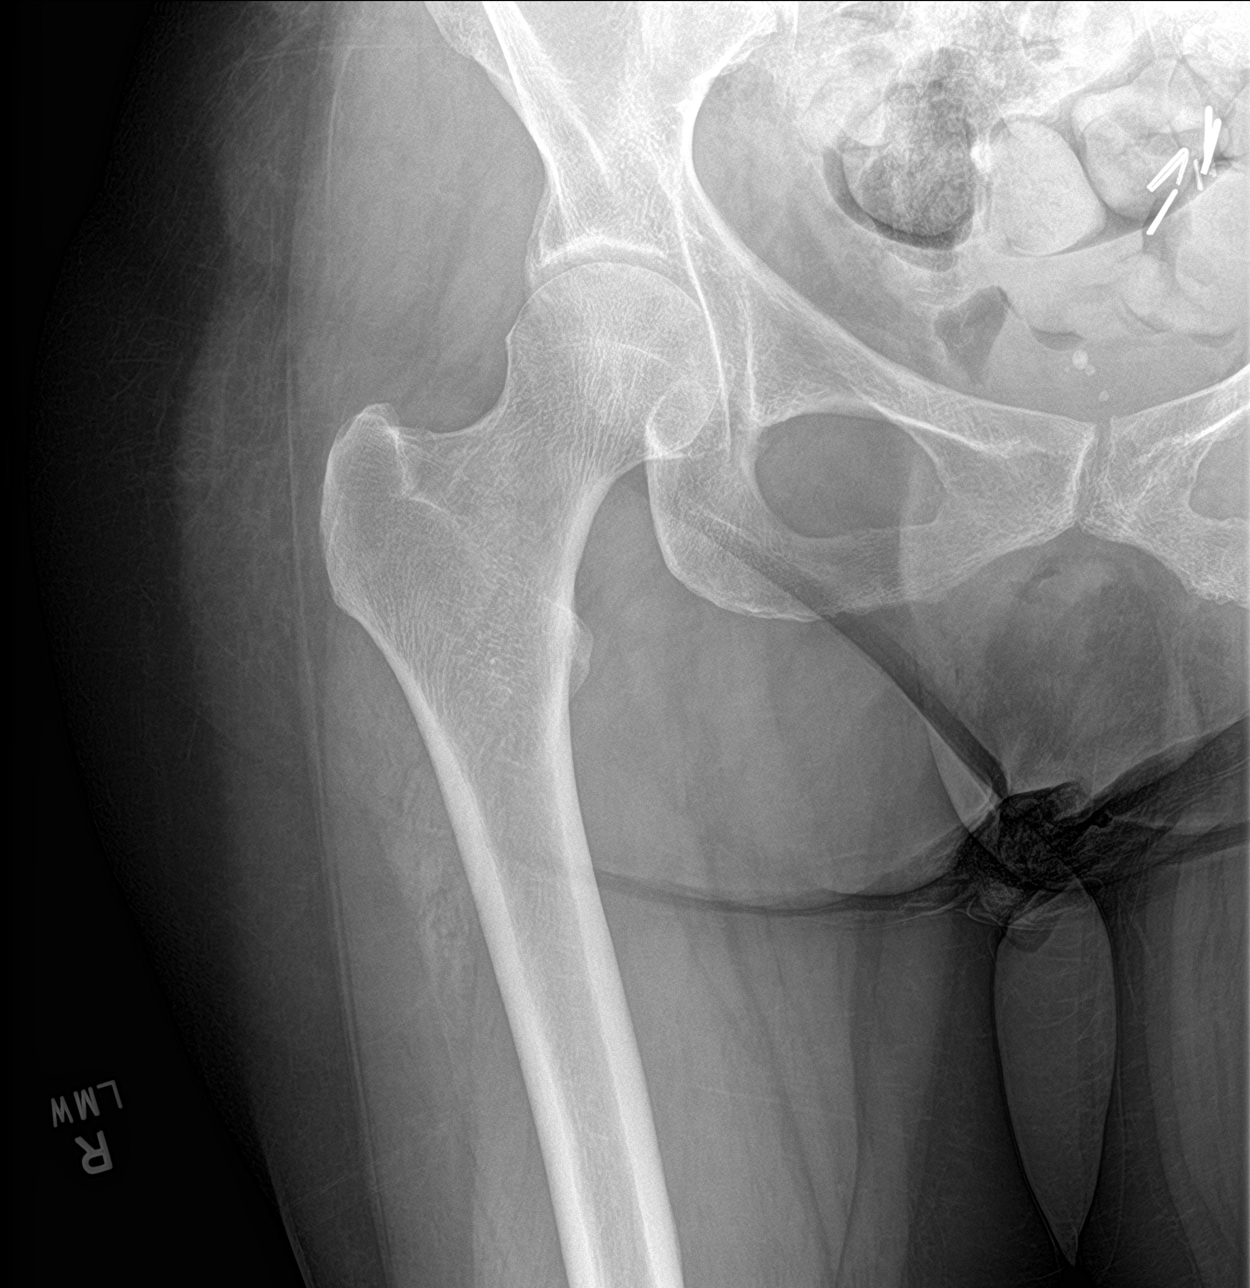

[hip lat]
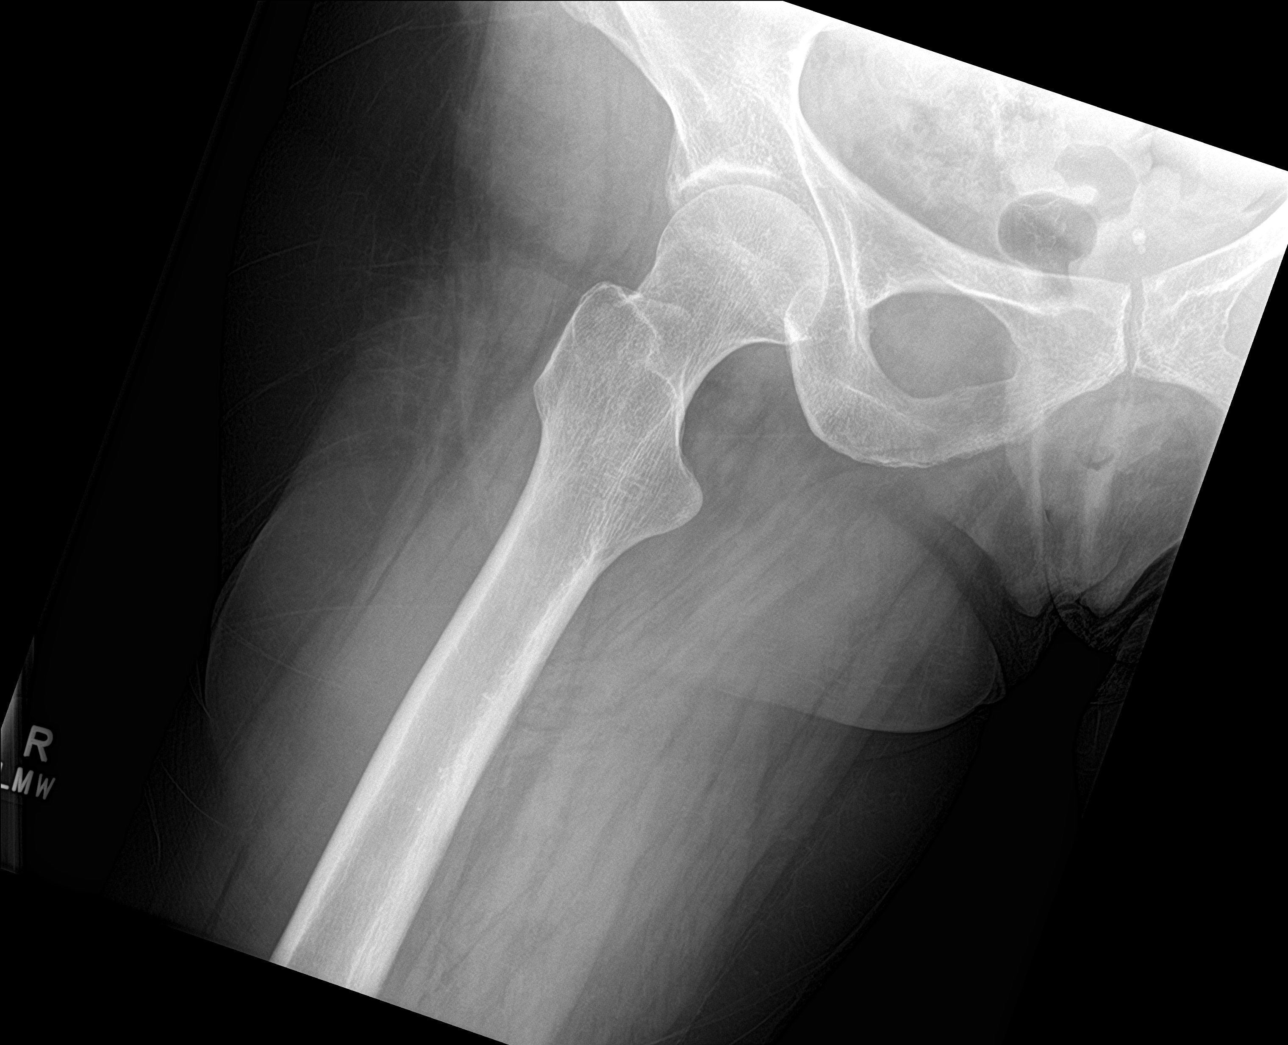

[3 of 3 positions shown; findings below may reference images not displayed]

FINDINGS: There is no evidence of hip fracture or dislocation. There is no
evidence of arthropathy or other focal bone abnormality.
IMPRESSION: Negative.

## 2020-06-08 NOTE — Progress Notes (Signed)
Patient: Lindsay Austin  Service Category: E/M  Provider: [name], MD  DOB: Jan 06, 1961  DOS: 06/08/2020  Location: Office  MRN: [id number]  Setting: Ambulatory outpatient  Referring Provider: [name], PA  Type: Established Patient  Specialty: Interventional Pain Management  PCP: [name], PA  Location: Home  Delivery: TeleHealth     Virtual Encounter - Pain Management PROVIDER NOTE: Information contained herein reflects review and annotations entered in association with encounter. Interpretation of such information and data should be left to medically-trained personnel. Information provided to patient can be located elsewhere in the medical record under "Patient Instructions". Document created using STT-dictation technology, any transcriptional errors that may result from process are unintentional.    Contact & Pharmacy Preferred: [phone] Home: [phone] (home) Mobile: [phone] (mobile) E-mail: [email or web address]  [name] [place] [address]Phone: [phone]: [phone]  Pre-screening  Ms. KCherneyoffered "in-person" vs "virtual" encounter. She indicated preferring virtual for this encounter.   Reason COVID-19*  Social distancing based on CDC and AMA recommendations.   I contacted Lindsay Austin 06/08/2020 via video conference.      I clearly identified myself as BGillis Santa MD. I verified that I was speaking with the correct person using two identifiers (Name: Lindsay Austin and date of birth: [birth year].  Consent I sought verbal advanced consent from Lindsay Haifor virtual visit interactions. I informed Ms. KSiedleckiof possible security and privacy concerns, risks, and limitations associated with providing "not-in-person" medical evaluation and management services. I also informed Ms. KShannahanof the availability of "in-person" appointments. Finally, I informed her that there would be a charge  for the virtual visit and that she could be  personally, fully or partially, financially responsible for it. Ms. KKlareexpressed understanding and agreed to proceed.   Historic Elements   Ms. JAlexiz Cothranis a 60y.o. year old, female patient evaluated today after our last contact on 05/22/2020. Lindsay Austin has a past medical history of Anxiety, Arthritis, Bladder cancer ([place], [place] of [place] ([place], DDD (degenerative disc disease), cervical, Depression, Failed back syndrome, Fibromyalgia, GERD (gastroesophageal reflux disease), Headache, Heart murmur, Hyperlipemia, Hypertension, IBS (irritable bowel syndrome), Kidney failure, Renal insufficiency, RSD (reflex sympathetic dystrophy), Seizure ([place], Stomach ulcer, and Trigeminal neuralgia. 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 [name] a current medication list which includes the following prescription(s): albuterol, bupropion, dicyclomine, escitalopram, fentanyl, [START ON 06/19/2020] fentanyl, emgality, methocarbamol, multiple minerals-vitamins, naloxone, olmesartan, ondansetron, pregabalin, rizatriptan, sucralfate, sulfamethoxazole-trimethoprim, trazodone, and triamcinolone. She  reports that she has never smoked. She has never used smokeless tobacco. She reports that she does not drink alcohol and does not use drugs. Ms. KYearwoodis allergic to methadone, other, and tape.   HPI  Today, she is being contacted for a post-procedure assessment.   Post-Procedure Evaluation  Procedure (05/22/2020):   Type: Lumbar Facet, Medial Branch Block(s) #1  Primary  Purpose: Diagnostic Region: Posterolateral Lumbosacral Spine Level:  L3, L4, L5,Medial Branch Level(s). Injecting these levels blocks the L3-4, L4-5, lumbar facet joints. Laterality: Bilateral  Sedation: Please see nurses note.  Effectiveness during initial hour after procedure(Ultra-Short Term Relief): 100 %   Local anesthetic used:  Long-acting (4-6 hours) Effectiveness: Defined as any analgesic benefit obtained secondary to the administration of local anesthetics. This carries significant diagnostic value as to the etiological location, or anatomical origin, of the pain. Duration of benefit is expected to coincide with the duration of the local anesthetic used.  Effectiveness during initial 4-6 hours after procedure(Short-Term Relief): 100 %   Long-term benefit: Defined as any relief past the pharmacologic duration of the local anesthetics.  Effectiveness past the initial 6 hours after procedure(Long-Term Relief): 90 % (pain relief lasted approx 6 days.)   Current benefits: Defined as benefit that persist at this time.   Analgesia:  Back to baseline Function: Back to baseline ROM: Back to baseline   Pharmacotherapy Assessment  Analgesic:  fentanyl patch  50 mcg an hour; 120 MME.   Monitoring: [place] PMP: PDMP reviewed during this encounter.       Pharmacotherapy: No side-effects or adverse reactions reported. Compliance: No problems identified. Effectiveness: Clinically acceptable. Plan: Refer to "POC".  UDS:  Summary  Date Value Ref Range Status  05/11/2020 Note  Final    Comment:    ==================================================================== [employer] Select 13 (MW) ==================================================================== Test                             Result       Flag       Units  Drug Present and Declared for Prescription Verification   Fentanyl                       15           EXPECTED   ng/mg creat   Norfentanyl                    >424          EXPECTED   ng/mg creat    Source of fentanyl is a scheduled prescription medication, including    IV, patch, and transmucosal formulations. Norfentanyl is an expected    metabolite of fentanyl.  ==================================================================== Test                      Result    Flag   Units      Ref Range   Creatinine              236              mg/dL      >=20 ==================================================================== Declared Medications:  The flagging and interpretation on this report are based on the  following declared medications.  Unexpected results may arise from  inaccuracies in the declared medications.   **Note: The testing scope of this panel includes these medications:   Fentanyl   **Note: The testing scope of this panel does not include the  following reported medications:   Acetaminophen  Albuterol  Bupropion (Wellbutrin SR)  Dicyclomine  Escitalopram  Galcanezumab (Emgality)  Methocarbamol  Multivitamin  Naloxone  Olmesartan (Benicar)  Ondansetron  Pregabalin  Rizatriptan  Sucralfate  Topical  Trazodone ==================================================================== For clinical consultation, please call [phone]. ====================================================================     Laboratory Chemistry Profile   Renal Lab Results  Component Value Date   BUN 16 08/31/2019   CREATININE 1.32 (H) 08/31/2019   LABCREA 71 08/29/2019   GFRAA 51 (L) 08/31/2019   GFRNONAA 44 (L) 08/31/2019     Hepatic Lab Results  Component Value Date  AST 32 08/29/2019   ALT 12 08/29/2019   ALBUMIN 4.1 08/29/2019   ALKPHOS 66 08/29/2019     Electrolytes Lab Results  Component Value Date   NA 142 08/31/2019   K 3.9 08/31/2019   CL 106 08/31/2019   CALCIUM 8.7 (L) 08/31/2019   MG 2.2 08/31/2019     Bone No results found for: VD25OH, VD125OH2TOT, XJ8832PQ9, IY6415AX0, 25OHVITD1, 25OHVITD2, 25OHVITD3,  TESTOFREE, TESTOSTERONE   Inflammation (CRP: Acute Phase) (ESR: Chronic Phase) No results found for: CRP, ESRSEDRATE, LATICACIDVEN     Note: Above Lab results reviewed.  Imaging  Ultrasound renal complete CLINICAL DATA:  Gross hematuria with right flank pain x3 weeks  EXAM: RENAL / URINARY TRACT ULTRASOUND COMPLETE  COMPARISON:  Renal ultrasound Aug 29, 2019  FINDINGS: Right Kidney:  Renal measurements: 12.4 x 5.6 x 5.2 cm = volume: 186 mL. Echogenicity within normal limits. No mass or hydronephrosis visualized.  Left Kidney:  Surgically absent  Bladder:  Appears normal for degree of bladder distention.  Other:  Right ureteral jet visualized.  IMPRESSION: Normal sonographic appearance of the right kidney knee.  Electronically Signed   By: Dahlia Bailiff MD   On: 06/03/2020 15:59  Assessment  The primary encounter diagnosis was Lumbar spondylosis. Diagnoses of Lumbar facet arthropathy, Piriformis syndrome of right side, Cervical facet joint syndrome, Sacroiliac joint dysfunction of right side, Cervical fusion syndrome (C4-T2), and Chronic pain syndrome were also pertinent to this visit.  Plan of Care   Virtual visit for postprocedural evaluation status post diagnostic lumbar facet medial branch nerve blocks bilaterally L3, L4, L5.  Patient states that low the injections helped out with her low back pain, she did have some numbness and weakness in bilateral legs for approximately 2 days after her injection.  She also had some associated instability with walking.  She states that this is improved over time.  I explained that perhaps a small amount of local anesthetic may have spilled over into her L5 nerve root resulting in some weakness of her lower extremities.  I reassured her that this will be self limiting and that there is nothing to do.  She will follow-up with me in 1 month for medication management as originally planned.  At that point we will further discuss with  the next steps should be that should include repeating another set of diagnostic lumbar facet medial branch nerve blocks or considering a peripheral nerve stimulator at the L4 medial branch  Follow-up plan:   Return for Keep sch. appt.   Recent Visits Date Type Provider Dept  05/22/20 Procedure visit [name], MD [facility]  05/11/20 Office Visit [name], MD [facility]  Showing recent visits within past 90 days and meeting all other requirements Today's Visits Date Type Provider Dept  06/08/20 Telemedicine [name], MD [facility]  Showing today's visits and meeting all other requirements Future Appointments Date Type Provider Dept  07/06/20 Appointment [name], MD [facility]  Showing future appointments within next 90 days and meeting all other requirements  I discussed the assessment and treatment plan with the patient. The patient was provided an opportunity to ask questions and all were answered. The patient agreed with the plan and demonstrated an understanding of the instructions.  Patient advised to call back or seek an in-person evaluation if the symptoms or condition worsens.  Duration of encounter: 20 minutes.  Note by: [name], MD Date: 06/08/2020; Time: 2:26 PM

## 2020-07-06 ENCOUNTER — Encounter: Payer: Self-pay | Admitting: Student in an Organized Health Care Education/Training Program

## 2020-07-06 ENCOUNTER — Other Ambulatory Visit: Payer: Self-pay

## 2020-07-06 ENCOUNTER — Ambulatory Visit
Payer: Medicaid Other | Attending: Student in an Organized Health Care Education/Training Program | Admitting: Student in an Organized Health Care Education/Training Program

## 2020-07-06 VITALS — BP 126/78 | HR 96 | Temp 96.6°F | Resp 16 | Ht 64.0 in | Wt 185.0 lb

## 2020-07-06 DIAGNOSIS — M4802 Spinal stenosis, cervical region: Secondary | ICD-10-CM | POA: Diagnosis present

## 2020-07-06 DIAGNOSIS — G894 Chronic pain syndrome: Secondary | ICD-10-CM

## 2020-07-06 DIAGNOSIS — G9050 Complex regional pain syndrome I, unspecified: Secondary | ICD-10-CM

## 2020-07-06 DIAGNOSIS — Z981 Arthrodesis status: Secondary | ICD-10-CM | POA: Diagnosis present

## 2020-07-06 DIAGNOSIS — M47812 Spondylosis without myelopathy or radiculopathy, cervical region: Secondary | ICD-10-CM | POA: Diagnosis present

## 2020-07-06 DIAGNOSIS — M47816 Spondylosis without myelopathy or radiculopathy, lumbar region: Secondary | ICD-10-CM

## 2020-07-06 DIAGNOSIS — G5701 Lesion of sciatic nerve, right lower limb: Secondary | ICD-10-CM

## 2020-07-06 DIAGNOSIS — T85192S Other mechanical complication of implanted electronic neurostimulator (electrode) of spinal cord, sequela: Secondary | ICD-10-CM

## 2020-07-06 DIAGNOSIS — M533 Sacrococcygeal disorders, not elsewhere classified: Secondary | ICD-10-CM | POA: Diagnosis not present

## 2020-07-06 DIAGNOSIS — S7001XS Contusion of right hip, sequela: Secondary | ICD-10-CM

## 2020-07-06 DIAGNOSIS — Q761 Klippel-Feil syndrome: Secondary | ICD-10-CM

## 2020-07-06 MED ORDER — METHOCARBAMOL 500 MG PO TABS: 750.0000 mg | ORAL_TABLET | Freq: Three times a day (TID) | ORAL | 2 refills | Status: DC | PRN

## 2020-07-06 MED ORDER — FENTANYL 50 MCG/HR TD PT72: 1.0000 | MEDICATED_PATCH | TRANSDERMAL | 0 refills | Status: DC

## 2020-07-06 MED ORDER — FENTANYL 50 MCG/HR TD PT72
1.0000 | MEDICATED_PATCH | TRANSDERMAL | 0 refills | Status: DC
Start: 2020-07-18 — End: 2020-07-18

## 2020-07-06 NOTE — Progress Notes (Signed)
PROVIDER NOTE: Information contained herein reflects review and annotations entered in association with encounter. Interpretation of such information and data should be left to medically-trained personnel. Information provided to patient can be located elsewhere in the medical record under "Patient Instructions". Document created using STT-dictation technology, any transcriptional errors that may result from process are unintentional.    Patient: Lindsay Austin  Service Category: E/M  Provider: [name], MD  DOB: January 17, 1961  DOS: 07/06/2020  Specialty: Interventional Pain Management  MRN: [id number]  Setting: Ambulatory outpatient  PCP: [name], PA  Type: Established Patient    Referring Provider: [name], PA  Location: Office  Delivery: Face-to-face     HPI  Lindsay Austin, a 59 y.o. year old female, is here today because of her Piriformis syndrome of right side [G57.01]. Lindsay Austin primary complain today is Back Pain (All over ), Neck Pain (Bilateral ), and Foot Pain (Bilateral ) Last encounter: My last encounter with her was on 05/22/2020. Pertinent problems: Lindsay Austin has Hx of lumbosacral spine surgery; History of lumbar fusion (L5-S1 fusion); Failed spinal cord stimulator, sequela; Cervical fusion syndrome (C4-C7); Chronic pain syndrome; Major depressive disorder with current active episode; Lumbar spondylosis; Cervical spondylosis without myelopathy; and Spinal stenosis in cervical region on their pertinent problem list. Pain Assessment: Severity of Chronic pain is reported as a 3 /10. Location: Back (neck) Mid,Lower,Upper,Left,Right/neck pain into shoulders and down arms and back pain into hips and legs. Onset: More than a month ago. Quality: Sore. Timing: Constant. Modifying factor(s): relaxation and rest. Vitals:  height is _0  (1.626 m) and weight is 185 lb (83.9 kg). Her temporal temperature is 96.6 F (35.9 C) (abnormal). Her blood pressure is 126/78 and her pulse is 96. Her  respiration is 16 and oxygen saturation is 98%.   Reason for encounter: medication management.    Lindsay Austin follows up today for medication management as well as worsening right sacroiliac joint, piriformis pain.  Of note she is status post right sacroiliac joint and right piriformis injection on 03/08/2020 provided her with approximately 70 to 75% pain relief in that right buttock, right groin, right hip region for approximately 6 weeks with gradual return of pain thereafter.  She states that she was able to perform ADLs more comfortably.  She is requesting a repeat injection in this region which is reasonable.  Otherwise she continues her fentanyl patch as prescribed.  No issues with constipation.  No change in dose.  We will refill as below.  Pharmacotherapy Assessment   Analgesic:  fentanyl patch  50 mcg an hour; 120 MME.   Monitoring: [place] PMP: PDMP reviewed during this encounter.       Pharmacotherapy: No side-effects or adverse reactions reported. Compliance: No problems identified. Effectiveness: Clinically acceptable.  Janett Billow, RN  07/06/2020  1:39 PM  Sign when Signing Visit Nursing Pain Medication Assessment:  Safety precautions to be maintained throughout the outpatient stay will include: orient to surroundings, keep bed in low position, maintain call bell within reach at all times, provide assistance with transfer out of bed and ambulation.  Medication Inspection Compliance: Pill count conducted under aseptic conditions, in front of the patient. Neither the pills nor the bottle was removed from the patient's sight at any time. Once count was completed pills were immediately returned to the patient in their original bottle.  Medication: Fentanyl patch Pill/Patch Count: 5 of 10 patches remain Pill/Patch Appearance: Markings consistent with prescribed medication Bottle Appearance: Standard pharmacy container. Clearly  labeled. Filled Date: 02 / 21 / 2022 Last Medication  intake:  Today    UDS:  Summary  Date Value Ref Range Status  05/11/2020 Note  Final    Comment:    ==================================================================== [employer] [facility] 13 (MW) ==================================================================== Test                             Result       Flag       Units  Drug Present and Declared for Prescription Verification   Fentanyl                       15           EXPECTED   ng/mg creat   Norfentanyl                    >424         EXPECTED   ng/mg creat    Source of fentanyl is a scheduled prescription medication, including    IV, patch, and transmucosal formulations. Norfentanyl is an expected    metabolite of fentanyl.  ==================================================================== Test                      Result    Flag   Units      Ref Range   Creatinine              236              mg/dL      >=20 ==================================================================== Declared Medications:  The flagging and interpretation on this report are based on the  following declared medications.  Unexpected results may arise from  inaccuracies in the declared medications.   **Note: The testing scope of this panel includes these medications:   Fentanyl   **Note: The testing scope of this panel does not include the  following reported medications:   Acetaminophen  Albuterol  Bupropion (Wellbutrin SR)  Dicyclomine  Escitalopram  Galcanezumab (Emgality)  Methocarbamol  Multivitamin  Naloxone  Olmesartan (Benicar)  Ondansetron  Pregabalin  Rizatriptan  Sucralfate  Topical  Trazodone ==================================================================== For clinical consultation, please call [phone]. ====================================================================      ROS  Constitutional: Denies any fever or chills Gastrointestinal: No reported hemesis, hematochezia, vomiting, or acute GI  distress Musculoskeletal: +right buttock, right SI-J/piriformis pain Neurological: No reported episodes of acute onset apraxia, aphasia, dysarthria, agnosia, amnesia, paralysis, loss of coordination, or loss of consciousness  Medication Review  Galcanezumab-gnlm, Multiple Minerals-Vitamins, albuterol, buPROPion, dicyclomine, escitalopram, fentaNYL, methocarbamol, naloxone, olmesartan, ondansetron, pregabalin, rizatriptan, sucralfate, traZODone, and triamcinolone  History Review  Allergy: Lindsay Austin is allergic to methadone, other, and tape. Drug: Lindsay Austin  reports no history of drug use. Alcohol:  reports no history of alcohol use. Tobacco:  reports that she has never smoked. She has never used smokeless tobacco. Social: Lindsay Austin  reports that she has never smoked. She has never used smokeless tobacco. She reports that she does not drink alcohol and does not use drugs. Medical:  has a past medical history of Anxiety, Arthritis, Bladder cancer ([place]), Cancer of kidney ([place]), DDD (degenerative disc disease), cervical, Depression, Failed back syndrome, Fibromyalgia, GERD (gastroesophageal reflux disease), Headache, Heart murmur, Hyperlipemia, Hypertension, IBS (irritable bowel syndrome), Kidney failure, Renal insufficiency, RSD (reflex sympathetic dystrophy), Seizure ([place]), Stomach ulcer, and Trigeminal neuralgia. Surgical: Lindsay Austin  has a past surgical history that  includes Transurethral resection of bladder tumor with gyrus (turbt-gyrus) (2012, 2013); Nephroureterectomy (2011); Back surgery (1995); Cholecystectomy; Brain surgery (2004); trigeminal neuralgia (2004); Spinal cord stimulator implant (2004); colonscopy (2006); Posterior cervical fusion/foraminotomy (Bilateral, 07/05/2019); Posterior cervical laminectomy (Bilateral, 07/05/2019); and Breast biopsy (Left, 2017). Family: family history includes Dementia in her father; Diabetes in her mother; Heart disease in her mother; Kidney disease in her  father.  Laboratory Chemistry Profile   Renal Lab Results  Component Value Date   BUN 16 08/31/2019   CREATININE 1.32 (H) 08/31/2019   LABCREA 71 08/29/2019   GFRAA 51 (L) 08/31/2019   GFRNONAA 44 (L) 08/31/2019     Hepatic Lab Results  Component Value Date   AST 32 08/29/2019   ALT 12 08/29/2019   ALBUMIN 4.1 08/29/2019   ALKPHOS 66 08/29/2019     Electrolytes Lab Results  Component Value Date   NA 142 08/31/2019   K 3.9 08/31/2019   CL 106 08/31/2019   CALCIUM 8.7 (L) 08/31/2019   MG 2.2 08/31/2019     Bone No results found for: VD25OH, VD125OH2TOT, GB2010OF1, QR9758IT2, 25OHVITD1, 25OHVITD2, 25OHVITD3, TESTOFREE, TESTOSTERONE   Inflammation (CRP: Acute Phase) (ESR: Chronic Phase) No results found for: CRP, ESRSEDRATE, LATICACIDVEN     Note: Above Lab results reviewed.  Recent Imaging Review  Ultrasound renal complete CLINICAL DATA:  Gross hematuria with right flank pain x3 weeks  EXAM: RENAL / URINARY TRACT ULTRASOUND COMPLETE  COMPARISON:  Renal ultrasound Aug 29, 2019  FINDINGS: Right Kidney:  Renal measurements: 12.4 x 5.6 x 5.2 cm = volume: 186 mL. Echogenicity within normal limits. No mass or hydronephrosis visualized.  Left Kidney:  Surgically absent  Bladder:  Appears normal for degree of bladder distention.  Other:  Right ureteral jet visualized.  IMPRESSION: Normal sonographic appearance of the right kidney knee.  Electronically Signed   By: Dahlia Bailiff MD   On: 06/03/2020 15:59 Note: Reviewed        Physical Exam  General appearance: Well nourished, well developed, and well hydrated. In no apparent acute distress Mental status: Alert, oriented x 3 (person, place, & time)       Respiratory: No evidence of acute respiratory distress Eyes: PERLA Vitals: BP 126/78 (BP Location: Right Arm, Patient Position: Sitting, Cuff Size: Large)   Pulse 96   Temp (!) 96.6 F (35.9 C) (Temporal)   Resp 16   Ht _0  (1.626 m)   Wt 185  lb (83.9 kg)   SpO2 98%   BMI 31.76 kg/m  BMI: Estimated body mass index is 31.76 kg/m as calculated from the following:   Height as of this encounter: _1  (1.626 m).   Weight as of this encounter: 185 lb (83.9 kg). Ideal: Ideal body weight: 54.7 kg (120 lb 9.5 oz) Adjusted ideal body weight: 66.4 kg (146 lb 5.7 oz)  Lumbar Spine Area Exam  Skin & Axial Inspection: Well healed scar from previous spine surgery detected Alignment: Symmetrical Functional ROM: Pain restricted ROM affecting primarily the right Stability: No instability detected Muscle Tone/Strength: Functionally intact. No obvious neuro-muscular anomalies detected. Sensory (Neurological): Musculoskeletal pain pattern Palpation: No palpable anomalies       Provocative Tests: Hyperextension/rotation test: deferred today       Lumbar quadrant test (Kemp's test): deferred today       Lateral bending test: (+) due to pain. Patrick's Maneuver: (+) for right-sided S-I arthralgia             FABER* test: (+)  for right-sided S-I arthralgia             S-I anterior distraction/compression test: (+) Right-sided       S-I lateral compression test: (+)   S-I arthralgia/arthropathy S-I Thigh-thrust test: (+)   S-I arthralgia/arthropathy S-I Gaenslen's test: deferred today         *(Flexion, ABduction and External Rotation)  Gait & Posture Assessment  Ambulation:Patient came in today in a wheel chair Gait:Limited. Using assistive device to ambulate Posture:Difficulty standing up straight, due to pain  Lower Extremity Exam    Side:Right lower extremity  Side:Left lower extremity  Stability:No instability observed  Stability:No instability observed  Skin & Extremity Inspection:Skin color, temperature, and hair growth are WNL. No peripheral edema or cyanosis. No masses, redness, swelling, asymmetry, or associated skin lesions. No contractures.  Skin & Extremity Inspection:Skin color, temperature, and  hair growth are WNL. No peripheral edema or cyanosis. No masses, redness, swelling, asymmetry, or associated skin lesions. No contractures.  Functional UQJ:FHLK restricted ROMfor all joints of the lower extremity   Functional TGY:BWLS restricted ROMfor all joints of the lower extremity   Muscle Tone/Strength:Functionally intact. No obvious neuro-muscular anomalies detected.  Muscle Tone/Strength:Functionally intact. No obvious neuro-muscular anomalies detected.  Sensory (Neurological):Unimpaired  Sensory (Neurological):Unimpaired  DTR: Patellar:deferred today Achilles:deferred today Plantar:deferred today  DTR: Patellar:deferred today Achilles:deferred today Plantar:deferred today  Palpation:No palpable anomalies  Palpation:No palpable anomalies    Assessment   Status Diagnosis  Having a Flare-up Having a Flare-up Having a Flare-up 1. Piriformis syndrome of right side   2. Sacroiliac joint dysfunction of right side   3. Lumbar spondylosis   4. Lumbar facet arthropathy   5. RSD (reflex sympathetic dystrophy)   6. Chronic pain syndrome   7. Cervical fusion syndrome (C4-T2)   8. Spinal stenosis in cervical region   9. History of lumbar fusion   10. Cervical spondylosis without myelopathy   11. Failed spinal cord stimulator, sequela   12. Contusion of right hip, sequela   13. Cervical facet joint syndrome       Plan of Care   [name] has a current medication list which includes the following long-term medication(s): albuterol, bupropion, dicyclomine, escitalopram, olmesartan, rizatriptan, sucralfate, and trazodone.  Pharmacotherapy (Medications Ordered): Meds ordered this encounter  Medications  . fentaNYL (DURAGESIC) 50 MCG/HR    Sig: Place 1 patch onto the skin every 3 (three) days. Most last 30 days.    Dispense:  10 patch    Refill:  0    [place] STOP ACT - Not applicable. Fill one day early if pharmacy is closed on  scheduled refill date.  . fentaNYL (DURAGESIC) 50 MCG/HR    Sig: Place 1 patch onto the skin every 3 (three) days. Most last 30 days.    Dispense:  10 patch    Refill:  0    [place] STOP ACT - Not applicable. Fill one day early if pharmacy is closed on scheduled refill date.  . fentaNYL (DURAGESIC) 50 MCG/HR    Sig: Place 1 patch onto the skin every 3 (three) days. Most last 30 days.    Dispense:  10 patch    Refill:  0    [place] STOP ACT - Not applicable. Fill one day early if pharmacy is closed on scheduled refill date.  . methocarbamol (ROBAXIN) 500 MG tablet    Sig: Take 1.5 tablets (750 mg total) by mouth every 8 (eight) hours as needed for muscle spasms.  Dispense:  150 tablet    Refill:  2   Orders:  Orders Placed This Encounter  Procedures  . SACROILIAC JOINT INJECTION    Standing Status:   Future    Standing Expiration Date:   08/06/2020    Scheduling Instructions:     Side: RIGHT     Sedation: w.     Timeframe: ASAP    Order Specific Question:   Where will this procedure be performed?    Answer:   [facility]  . TRIGGER POINT INJECTION    Area: Buttocks region (gluteal area) Indications: Piriformis muscle pain;  R piriformis-syndrome; piriformis muscle spasms (Z61.096). CPT code: 20552    Scheduling Instructions:     Type: Myoneural block (TPI) of piriformis muscle.     Side:  right     Sedation: Patient's choice.     Timeframe: Today    Order Specific Question:   Where will this procedure be performed?    Answer:   [facility]   Follow-up plan:   Return in about 4 days (around 07/10/2020) for R SI-J + R pirifomris #2 , with sedation.   Recent Visits Date Type Provider Dept  06/08/20 Telemedicine [name], MD [facility]  05/22/20 Procedure visit [name], MD [facility]  05/11/20 Office Visit [name], MD [facility]  Showing recent visits within past 90 days and meeting all other requirements Today's  Visits Date Type Provider Dept  07/06/20 Office Visit [name], MD [facility]  Showing today's visits and meeting all other requirements Future Appointments Date Type Provider Dept  10/03/20 Appointment [name], MD [facility]  Showing future appointments within next 90 days and meeting all other requirements  I discussed the assessment and treatment plan with the patient. The patient was provided an opportunity to ask questions and all were answered. The patient agreed with the plan and demonstrated an understanding of the instructions.  Patient advised to call back or seek an in-person evaluation if the symptoms or condition worsens.  Duration of encounter: 30 minutes.  Note by: [name], MD Date: 07/06/2020; Time: 2:35 PM

## 2020-07-06 NOTE — Progress Notes (Signed)
Nursing Pain Medication Assessment:  Safety precautions to be maintained throughout the outpatient stay will include: orient to surroundings, keep bed in low position, maintain call bell within reach at all times, provide assistance with transfer out of bed and ambulation.  Medication Inspection Compliance: Pill count conducted under aseptic conditions, in front of the patient. Neither the pills nor the bottle was removed from the patient's sight at any time. Once count was completed pills were immediately returned to the patient in their original bottle.  Medication: Fentanyl patch Pill/Patch Count: 5 of 10 patches remain Pill/Patch Appearance: Markings consistent with prescribed medication Bottle Appearance: Standard pharmacy container. Clearly labeled. Filled Date: 02 / 21 / 2022 Last Medication intake:  Today

## 2020-07-06 NOTE — Patient Instructions (Addendum)
Preparing for Procedure with Sedation Instructions: . Oral Intake: Do not eat or drink anything for at least 8 hours prior to your procedure. . Transportation: Public transportation is not allowed. Bring an adult driver. The driver must be physically present in our waiting room before any procedure can be started. Marland Kitchen Physical Assistance: Bring an adult capable of physically assisting you, in the event you need help. . Blood Pressure Medicine: Take your blood pressure medicine with a sip of water the morning of the procedure. . Insulin: Take only  of your normal insulin dose. . Preventing infections: Shower with an antibacterial soap the morning of your procedure. . Build-up your immune system: Take 1000 mg of Vitamin C with every meal (3 times a day) the day prior to your procedure. . Pregnancy: If you are pregnant, call and cancel the procedure. . Sickness: If you have a cold, fever, or any active infections, call and cancel the procedure. . Arrival: You must be in the facility at least 30 minutes prior to your scheduled procedure. . Children: Do not bring children with you. . Dress appropriately: Bring dark clothing that you would not mind if they get stained. . Valuables: Do not bring any jewelry or valuables. Procedure appointments are reserved for interventional treatments only. Marland Kitchen No Prescription Refills. . No medication changes will be discussed during procedure appointments. . No disability issues will be discussed. Sacroiliac (SI) Joint Injection Patient Information  Description: The sacroiliac joint connects the scrum (very low back and tailbone) to the ilium (a pelvic bone which also forms half of the hip joint).  Normally this joint experiences very little motion.  When this joint becomes inflamed or unstable low back and or hip and pelvis pain may result.  Injection of this joint with local anesthetics (numbing medicines) and steroids can provide diagnostic information and reduce  pain.  This injection is performed with the aid of x-ray guidance into the tailbone area while you are lying on your stomach.   You may experience an electrical sensation down the leg while this is being done.  You may also experience numbness.  We also may ask if we are reproducing your normal pain during the injection.  Conditions which may be treated SI injection:  Low back, buttock, hip or leg pain  Preparation for the Injection:  Do not eat any solid food or dairy products within 8 hours of your appointment.  You may drink clear liquids up to 3 hours before appointment.  Clear liquids include water, black coffee, juice or soda.  No milk or cream please. You may take your regular medications, including pain medications with a sip of water before your appointment.  Diabetics should hold regular insulin (if take separately) and take 1/2 normal NPH dose the morning of the procedure.  Carry some sugar containing items with you to your appointment. A driver must accompany you and be prepared to drive you home after your procedure. Bring all of your current medications with you. An IV may be inserted and sedation may be given at the discretion of the physician. A blood pressure cuff, EKG and other monitors will often be applied during the procedure.  Some patients may need to have extra oxygen administered for a short period.  You will be asked to provide medical information, including your allergies, prior to the procedure.  We must know immediately if you are taking blood thinners (like Coumadin/Warfarin) or if you are allergic to IV iodine contrast (dye).  We  must know if you could possible be pregnant.  Possible side effects:  Bleeding from needle site Infection (rare, may require surgery) Nerve injury (rare) Numbness & tingling (temporary) A brief convulsion or seizure Light-headedness (temporary) Pain at injection site (several days) Decreased blood pressure (temporary) Weakness in  the leg (temporary)   Call if you experience:  New onset weakness or numbness of an extremity below the injection site that last more than 8 hours. Hives or difficulty breathing ( go to the emergency room) Inflammation or drainage at the injection site Any new symptoms which are concerning to you  Please note:  Although the local anesthetic injected can often make your back/ hip/ buttock/ leg feel good for several hours after the injections, the pain will likely return.  It takes 3-7 days for steroids to work in the sacroiliac area.  You may not notice any pain relief for at least that one week.  If effective, we will often do a series of three injections spaced 3-6 weeks apart to maximally decrease your pain.  After the initial series, we generally will wait some months before a repeat injection of the same type.  If you have any questions, please call [phone] [facility]  .

## 2020-07-18 ENCOUNTER — Telehealth: Payer: Self-pay

## 2020-07-18 MED ORDER — FENTANYL 50 MCG/HR TD PT72
1.0000 | MEDICATED_PATCH | TRANSDERMAL | 0 refills | Status: DC
Start: 2020-07-18 — End: 2020-07-18

## 2020-07-18 MED ORDER — FENTANYL 50 MCG/HR TD PT72
1.0000 | MEDICATED_PATCH | TRANSDERMAL | 0 refills | Status: DC
Start: 2020-08-17 — End: 2020-07-18

## 2020-07-18 MED ORDER — FENTANYL 50 MCG/HR TD PT72: 1.0000 | MEDICATED_PATCH | TRANSDERMAL | 0 refills | Status: AC

## 2020-07-18 MED ORDER — FENTANYL 50 MCG/HR TD PT72: 1.0000 | MEDICATED_PATCH | TRANSDERMAL | 0 refills | Status: DC

## 2020-07-18 NOTE — Telephone Encounter (Signed)
Patient notified that scripts for Duragesic patch have been sent to pharmacy.

## 2020-07-18 NOTE — Addendum Note (Signed)
Addended by: [name] on: 07/18/2020 03:39 PM   Modules accepted: Orders

## 2020-07-18 NOTE — Telephone Encounter (Signed)
She is having trouble with the fentanyl prescription. She is saying the [facility] cant find the script. Please call

## 2020-07-18 NOTE — Telephone Encounter (Signed)
Message sent to Dr. Holley Raring. It looks like they were printed.

## 2020-07-19 ENCOUNTER — Ambulatory Visit
Admission: RE | Admit: 2020-07-19 | Discharge: 2020-07-19 | Disposition: A | Discharge status: Home / Self Care | Payer: Medicaid Other | Source: Ambulatory Visit | Attending: Student in an Organized Health Care Education/Training Program | Admitting: Student in an Organized Health Care Education/Training Program

## 2020-07-19 ENCOUNTER — Other Ambulatory Visit: Payer: Self-pay

## 2020-07-19 ENCOUNTER — Encounter: Payer: Self-pay | Admitting: Student in an Organized Health Care Education/Training Program

## 2020-07-19 ENCOUNTER — Ambulatory Visit (HOSPITAL_BASED_OUTPATIENT_CLINIC_OR_DEPARTMENT_OTHER): Payer: Medicaid Other | Admitting: Student in an Organized Health Care Education/Training Program

## 2020-07-19 DIAGNOSIS — Z9682 Presence of neurostimulator: Secondary | ICD-10-CM | POA: Diagnosis not present

## 2020-07-19 DIAGNOSIS — Z885 Allergy status to narcotic agent status: Secondary | ICD-10-CM | POA: Insufficient documentation

## 2020-07-19 DIAGNOSIS — Z79899 Other long term (current) drug therapy: Secondary | ICD-10-CM | POA: Insufficient documentation

## 2020-07-19 DIAGNOSIS — M533 Sacrococcygeal disorders, not elsewhere classified: Secondary | ICD-10-CM | POA: Diagnosis present

## 2020-07-19 DIAGNOSIS — Z888 Allergy status to other drugs, medicaments and biological substances status: Secondary | ICD-10-CM | POA: Insufficient documentation

## 2020-07-19 DIAGNOSIS — G894 Chronic pain syndrome: Secondary | ICD-10-CM | POA: Insufficient documentation

## 2020-07-19 DIAGNOSIS — G5701 Lesion of sciatic nerve, right lower limb: Secondary | ICD-10-CM | POA: Diagnosis not present

## 2020-07-19 DIAGNOSIS — Z981 Arthrodesis status: Secondary | ICD-10-CM | POA: Insufficient documentation

## 2020-07-19 MED ORDER — IOHEXOL 180 MG/ML  SOLN
INTRAMUSCULAR | Status: AC
  Filled 2020-07-19: qty 20

## 2020-07-19 MED ORDER — LIDOCAINE HCL 2 % IJ SOLN
INTRAMUSCULAR | Status: AC
  Filled 2020-07-19: qty 20

## 2020-07-19 MED ORDER — DIAZEPAM 5 MG PO TABS
ORAL_TABLET | ORAL | Status: AC
  Filled 2020-07-19: qty 1

## 2020-07-19 MED ORDER — METHYLPREDNISOLONE ACETATE 40 MG/ML IJ SUSP
40.0000 mg | Freq: Once | INTRAMUSCULAR | Status: AC
  Administered 2020-07-19: 40 mg via INTRA_ARTICULAR

## 2020-07-19 MED ORDER — DIAZEPAM 5 MG PO TABS
5.0000 mg | ORAL_TABLET | Freq: Once | ORAL | Status: AC
  Administered 2020-07-19: 5 mg via ORAL

## 2020-07-19 MED ORDER — DEXAMETHASONE SODIUM PHOSPHATE 10 MG/ML IJ SOLN
10.0000 mg | Freq: Once | INTRAMUSCULAR | Status: AC
  Administered 2020-07-19: 10 mg
  Filled 2020-07-19: qty 1

## 2020-07-19 MED ORDER — ROPIVACAINE HCL 2 MG/ML IJ SOLN
INTRAMUSCULAR | Status: AC
  Filled 2020-07-19: qty 10

## 2020-07-19 MED ORDER — METHYLPREDNISOLONE ACETATE 40 MG/ML IJ SUSP
INTRAMUSCULAR | Status: AC
  Filled 2020-07-19: qty 1

## 2020-07-19 MED ORDER — FENTANYL CITRATE (PF) 100 MCG/2ML IJ SOLN
INTRAMUSCULAR | Status: AC
  Filled 2020-07-19: qty 2

## 2020-07-19 MED ORDER — IOHEXOL 180 MG/ML  SOLN
10.0000 mL | Freq: Once | INTRAMUSCULAR | Status: AC
  Administered 2020-07-19: 10 mL via INTRA_ARTICULAR

## 2020-07-19 MED ORDER — LIDOCAINE HCL 2 % IJ SOLN
20.0000 mL | Freq: Once | INTRAMUSCULAR | Status: AC
  Administered 2020-07-19: 400 mg

## 2020-07-19 MED ORDER — ROPIVACAINE HCL 2 MG/ML IJ SOLN
4.0000 mL | Freq: Once | INTRAMUSCULAR | Status: AC
  Administered 2020-07-19: 10 mL via INTRA_ARTICULAR

## 2020-07-19 NOTE — Progress Notes (Signed)
PROVIDER NOTE: Information contained herein reflects review and annotations entered in association with encounter. Interpretation of such information and data should be left to medically-trained personnel. Information provided to patient can be located elsewhere in the medical record under "Patient Instructions". Document created using STT-dictation technology, any transcriptional errors that may result from process are unintentional.    Patient: Lindsay Austin  Service Category: Procedure  Provider: [name], MD  DOB: [birth year]  DOS: 07/19/2020  Location: [facility]  MRN: [id number]  Setting: Ambulatory - outpatient  Referring Provider: [name], MD  Type: Established Patient  Specialty: Interventional Pain Management  PCP: [name], [place]   Primary Reason for Visit: Interventional Pain Management Treatment. CC: Hip Pain  Procedure:          Anesthesia, Analgesia, Anxiolysis:  Type: Diagnostic Sacroiliac Joint Steroid Injection And right piriformis injection #1 Region: Inferior Lumbosacral Region Level: PIIS (Posterior Inferior Iliac Spine) Laterality: Right-Side  Type: Local Anesthesia + Po Valium  Local Anesthetic: Lidocaine 1-2%  Position: Prone           Indications: 1. Sacroiliac joint dysfunction of right side   2. Piriformis syndrome of right side   3. Chronic pain syndrome    Pain Score: Pre-procedure: 3 /10 Post-procedure: 0-No pain/10   Pre-op H&P Assessment:  Ms. Schendel is a 60 y.o. (year old), female patient, seen today for interventional treatment. 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 Ms. Mendel has a current medication list which includes the following prescription(s): albuterol, bupropion, dicyclomine, escitalopram, fentanyl, [START ON 08/17/2020] fentanyl, [START ON 09/16/2020] fentanyl, emgality, methocarbamol, multiple minerals-vitamins, naloxone, olmesartan, ondansetron, pregabalin, rizatriptan, sucralfate, trazodone, and triamcinolone. Her primarily concern today is the Hip Pain  Initial Vital Signs:  Pulse/HCG Rate: 75ECG Heart Rate: 63 Temp: (!) 97.2 F (36.2 C) Resp: 16 BP: 127/80 SpO2: 93 %  BMI: Estimated body mass index is 31.76 kg/m as calculated from the following:   Height as of this encounter: 5\' 4"  (1.626 m).   Weight as of this encounter: 185 lb (83.9 kg).  Risk Assessment: Allergies: Reviewed. She is allergic to methadone, other, and tape.  Allergy Precautions: None required Coagulopathies: Reviewed. None identified.  Blood-thinner therapy: None at this time Active Infection(s): Reviewed. None identified. Ms. Eisenhuth is afebrile  Site Confirmation: Ms. Elenbaas was asked to confirm the procedure and laterality before marking the site Procedure checklist: Completed Consent: Before the procedure and under the influence of no sedative(s), amnesic(s), or anxiolytics, the patient was informed of the treatment options, risks and possible complications. To fulfill our ethical and legal obligations, as recommended by the American Medical Association's Code of Ethics, I have informed the patient of my clinical impression; the nature and purpose of the treatment or procedure; the risks, benefits, and possible complications of the intervention; the alternatives, including doing nothing; the risk(s) and benefit(s) of the alternative treatment(s) or procedure(s); and the risk(s) and benefit(s) of doing nothing. The patient was provided information about the general risks and possible complications associated with the procedure. These  may include, but are not limited to: failure to achieve desired goals, infection, bleeding, organ or nerve damage, allergic reactions, paralysis, and death. In addition, the patient was informed of  those risks and complications associated to the procedure, such as failure to decrease pain; infection; bleeding; organ or nerve damage with subsequent damage to sensory, motor, and/or autonomic systems, resulting in permanent pain, numbness, and/or weakness of one or several areas of the body; allergic reactions; (i.e.: anaphylactic reaction); and/or death. Furthermore, the patient was informed of those risks and complications associated with the medications. These include, but are not limited to: allergic reactions (i.e.: anaphylactic or anaphylactoid reaction(s)); adrenal axis suppression; blood sugar elevation that in diabetics may result in ketoacidosis or comma; water retention that in patients with history of congestive heart failure may result in shortness of breath, pulmonary edema, and decompensation with resultant heart failure; weight gain; swelling or edema; medication-induced neural toxicity; particulate matter embolism and blood vessel occlusion with resultant organ, and/or nervous system infarction; and/or aseptic necrosis of one or more joints. Finally, the patient was informed that Medicine is not an exact science; therefore, there is also the possibility of unforeseen or unpredictable risks and/or possible complications that may result in a catastrophic outcome. The patient indicated having understood very clearly. We have given the patient no guarantees and we have made no promises. Enough time was given to the patient to ask questions, all of which were answered to the patient's satisfaction. Ms. Rane has indicated that she wanted to continue with the procedure. Attestation: I, the ordering provider, attest that I have discussed with the patient the benefits, risks, side-effects, alternatives,  likelihood of achieving goals, and potential problems during recovery for the procedure that I have provided informed consent. Date  Time: 07/19/2020 12:13 PM  Pre-Procedure Preparation:  Monitoring: As per clinic protocol. Respiration, ETCO2, SpO2, BP, heart rate and rhythm monitor placed and checked for adequate function Safety Precautions: Patient was assessed for positional comfort and pressure points before starting the procedure. Time-out: I initiated and conducted the "Time-out" before starting the procedure, as per protocol. The patient was asked to participate by confirming the accuracy of the "Time Out" information. Verification of the correct person, site, and procedure were performed and confirmed by me, the nursing staff, and the patient. "Time-out" conducted as per Joint Commission's Universal Protocol (UP.01.01.01). Time: 1256  Description of Procedure:          Target Area: Inferior, posterior, aspect of the sacroiliac fissure Approach: Posterior, paraspinal, ipsilateral approach. Area Prepped: Entire Lower Lumbosacral Region DuraPrep (Iodine Povacrylex [0.7% available iodine] and Isopropyl Alcohol, 74% w/w) Safety Precautions: Aspiration looking for blood return was conducted prior to all injections. At no point did we inject any substances, as a needle was being advanced. No attempts were made at seeking any paresthesias. Safe injection practices and needle disposal techniques used. Medications properly checked for expiration dates. SDV (single dose vial) medications used. Description of the Procedure: Protocol guidelines were followed. The patient was placed in position over the procedure table. The target area was identified and the area prepped in the usual manner. Skin & deeper tissues infiltrated with local anesthetic. Appropriate amount of time allowed to pass for local anesthetics to take effect. The procedure needle was advanced under fluoroscopic guidance into the sacroiliac  joint until a firm endpoint was obtained. Proper needle placement secured. Negative aspiration confirmed. Solution injected in intermittent fashion, asking for systemic symptoms every 0.5cc of injectate. The needles were then removed and the area cleansed, making sure to leave some of the prepping solution back to take advantage of its long term bactericidal properties. Vitals:   07/19/20 1219 07/19/20 1259  07/19/20 1304  BP: 127/80 (!) 115/91 113/87  Pulse: 75    Resp: 16 15 11   Temp: (!) 97.2 F (36.2 C)    TempSrc: Temporal    SpO2: 93% 98% 94%  Weight: 185 lb (83.9 kg)    Height: 5\' 4"  (1.626 m)      Start Time: 1257 hrs. End Time: 1304 hrs. Materials:  Needle(s) Type: Spinal Needle Gauge: 22G Length: 3.5-in Medication(s): Please see orders for medications and dosing details. 5 cc solution made of 4 cc of 0.2% ropivacaine, 1 cc of methylprednisolone, 40 mg/cc.  Injected into the right SI joint after contrast confirmation. A right piriformis trigger point injection was also done under fluoroscopy.  1 cm deep, 1 cm inferior, 1 cm lateral to the inferior fissure of the SI joint, the piriformis muscle was identified with contrast confirmation outlining striated muscle.  4 cc solution made of 3 cc of 0.2% ropivacaine, 1 cc of Decadron 10 mg/cc was injected into the right piriformis muscle under live fluoroscopy.  Imaging Guidance (Non-Spinal):          Type of Imaging Technique: Fluoroscopy Guidance (Non-Spinal) Indication(s): Assistance in needle guidance and placement for procedures requiring needle placement in or near specific anatomical locations not easily accessible without such assistance. Exposure Time: Please see nurses notes. Contrast: Before injecting any contrast, we confirmed that the patient did not have an allergy to iodine, shellfish, or radiological contrast. Once satisfactory needle placement was completed at the desired level, radiological contrast was injected. Contrast  injected under live fluoroscopy. No contrast complications. See chart for type and volume of contrast used. Fluoroscopic Guidance: I was personally present during the use of fluoroscopy. "Tunnel Vision Technique" used to obtain the best possible view of the target area. Parallax error corrected before commencing the procedure. "Direction-depth-direction" technique used to introduce the needle under continuous pulsed fluoroscopy. Once target was reached, antero-posterior, oblique, and lateral fluoroscopic projection used confirm needle placement in all planes. Images permanently stored in EMR. Interpretation: I personally interpreted the imaging intraoperatively. Adequate needle placement confirmed in multiple planes. Appropriate spread of contrast into desired area was observed. No evidence of afferent or efferent intravascular uptake. Permanent images saved into the patient's record.  Post-operative Assessment:  Post-procedure Vital Signs:  Pulse/HCG Rate: 7563 Temp: (!) 97.2 F (36.2 C) Resp: 11 BP: 113/87 SpO2: 94 %  EBL: None  Complications: No immediate post-treatment complications observed by team, or reported by patient.  Note: The patient tolerated the entire procedure well. A repeat set of vitals were taken after the procedure and the patient was kept under observation following institutional policy, for this type of procedure. Post-procedural neurological assessment was performed, showing return to baseline, prior to discharge. The patient was provided with post-procedure discharge instructions, including a section on how to identify potential problems. Should any problems arise concerning this procedure, the patient was given instructions to immediately contact us, at any time, without hesitation. In any case, we plan to contact the patient by telephone for a follow-up status report regarding this interventional procedure.  Comments:  No additional relevant information.  Plan of Care   Orders:  Orders Placed This Encounter  Procedures  . DG PAIN [facility] C-ARM 1-60 MIN NO REPORT    Intraoperative interpretation by procedural physician at [place].    Standing Status:   Standing    Number of Occurrences:   1    Order Specific Question:   Reason for exam:  Answer:   Assistance in needle guidance and placement for procedures requiring needle placement in or near specific anatomical locations not easily accessible without such assistance.   Chronic Opioid Analgesic:   fentanyl patch  50 mcg an hour; 120 MME.   Medications ordered for procedure: Meds ordered this encounter  Medications  . iohexol (OMNIPAQUE) 180 MG/ML injection 10 mL    Must be Myelogram-compatible. If not available, you may substitute with a water-soluble, non-ionic, hypoallergenic, myelogram-compatible radiological contrast medium.  Marland Kitchen lidocaine (XYLOCAINE) 2 % (with pres) injection 400 mg  . ropivacaine (PF) 2 mg/mL (0.2%) (NAROPIN) injection 4 mL  . methylPREDNISolone acetate (DEPO-MEDROL) injection 40 mg  . diazepam (VALIUM) tablet 5 mg  . dexamethasone (DECADRON) injection 10 mg   Medications administered: We administered iohexol, lidocaine, ropivacaine (PF) 2 mg/mL (0.2%), methylPREDNISolone acetate, diazepam, and dexamethasone.  See the medical record for exact dosing, route, and time of administration.  Follow-up plan:   Return in about 4 weeks (around 08/16/2020) for Post Procedure Evaluation, virtual.    Recent Visits Date Type Provider Dept  07/06/20 Office Visit [name], MD [facility]  06/08/20 Telemedicine [name], MD [facility]  05/22/20 Procedure visit [name], MD [facility]  05/11/20 Office Visit [name], MD [facility]  Showing recent visits within past 90 days and meeting all other requirements Today's Visits Date Type Provider Dept  07/19/20 Procedure visit [name], MD [facility]   Showing today's visits and meeting all other requirements Future Appointments Date Type Provider Dept  08/16/20 Appointment [name], MD [facility]  10/03/20 Appointment [name], MD [facility]  Showing future appointments within next 90 days and meeting all other requirements  Disposition: Discharge home  Discharge (Date  Time): 07/19/2020; 1315 hrs.   Primary Care Physician: [name], [place] Location: [facility] Note by: [name], MD Date: 07/19/2020; Time: 1:18 PM  Disclaimer:  Medicine is not an exact science. The only guarantee in medicine is that nothing is guaranteed. It is important to note that the decision to proceed with this intervention was based on the information collected from the patient. The Data and conclusions were drawn from the patient's questionnaire, the interview, and the physical examination. Because the information was provided in large part by the patient, it cannot be guaranteed that it has not been purposely or unconsciously manipulated. Every effort has been made to obtain as much relevant data as possible for this evaluation. It is important to note that the conclusions that lead to this procedure are derived in large part from the available data. Always take into account that the treatment will also be dependent on availability of resources and existing treatment guidelines, considered by other Pain Management Practitioners as being common knowledge and practice, at the time of the intervention. For Medico-Legal purposes, it is also important to point out that variation in procedural techniques and pharmacological choices are the acceptable norm. The indications, contraindications, technique, and results of the above procedure should only be interpreted and judged by a Board-Certified Interventional Pain Specialist with extensive familiarity and expertise in the same exact procedure and technique.

## 2020-07-19 NOTE — Patient Instructions (Signed)

## 2020-07-19 NOTE — Progress Notes (Signed)
Safety precautions to be maintained throughout the outpatient stay will include: orient to surroundings, keep bed in low position, maintain call bell within reach at all times, provide assistance with transfer out of bed and ambulation.  

## 2020-07-20 ENCOUNTER — Telehealth: Payer: Self-pay | Admitting: *Deleted

## 2020-07-20 NOTE — Telephone Encounter (Signed)
No problems post procedure. 

## 2020-08-15 ENCOUNTER — Telehealth: Payer: Self-pay | Admitting: *Deleted

## 2020-08-15 NOTE — Telephone Encounter (Signed)
Attempted to call for pre appointment review of allergies/meds. Message left. 

## 2020-08-16 ENCOUNTER — Other Ambulatory Visit: Payer: Self-pay

## 2020-08-16 ENCOUNTER — Ambulatory Visit
Payer: Medicaid Other | Attending: Student in an Organized Health Care Education/Training Program | Admitting: Student in an Organized Health Care Education/Training Program

## 2020-08-16 ENCOUNTER — Encounter: Payer: Self-pay | Admitting: Student in an Organized Health Care Education/Training Program

## 2020-08-16 DIAGNOSIS — G5701 Lesion of sciatic nerve, right lower limb: Secondary | ICD-10-CM

## 2020-08-16 DIAGNOSIS — M533 Sacrococcygeal disorders, not elsewhere classified: Secondary | ICD-10-CM

## 2020-08-16 DIAGNOSIS — G894 Chronic pain syndrome: Secondary | ICD-10-CM | POA: Diagnosis not present

## 2020-08-16 DIAGNOSIS — G9050 Complex regional pain syndrome I, unspecified: Secondary | ICD-10-CM

## 2020-08-16 NOTE — Progress Notes (Signed)
Patient: Lindsay Austin  Service Category: E/M  Provider: [name], MD  DOB: [birth year]  DOS: 08/16/2020  Location: Office  MRN: [id number]  Setting: Ambulatory outpatient  Referring Provider: [name], PA  Type: Established Patient  Specialty: Interventional Pain Management  PCP: [name], PA  Location: Home  Delivery: TeleHealth     Virtual Encounter - Pain Management PROVIDER NOTE: Information contained herein reflects review and annotations entered in association with encounter. Interpretation of such information and data should be left to medically-trained personnel. Information provided to patient can be located elsewhere in the medical record under "Patient Instructions". Document created using STT-dictation technology, any transcriptional errors that may result from process are unintentional.    Contact & Pharmacy Preferred: [phone] Home: [phone] (home) Mobile: [phone] (mobile) E-mail: [email or web address]  [name] [place] [address]Phone: [phone]: [phone]  Pre-screening  Ms. KLheureuxoffered "in-person" vs "virtual" encounter. She indicated preferring virtual for this encounter.   Reason COVID-19*  Social distancing based on CDC and AMA recommendations.   I contacted JNahomi Hegneron 08/16/2020 via video conference.      I clearly identified myself as BGillis Santa MD. I verified that I was speaking with the correct person using two identifiers (Name: [name] and date of birth: 8Jan 27, 1962.  Consent I sought verbal advanced consent from [name] virtual visit interactions. I informed Ms. KMccolmof possible security and privacy concerns, risks, and limitations associated with providing "not-in-person" medical evaluation and management services. I also informed Ms. KMaudlinof the availability of "in-person" appointments. Finally, I informed her that there would be a charge  for the virtual visit and that she could be  personally, fully or partially, financially responsible for it. Ms. KTalsmaexpressed understanding and agreed to proceed.   Historic Elements   Ms. JJeanae Whitmillis a 60y.o. year old, female patient evaluated today after our last contact on 07/19/2020. [name] has a past medical history of Anxiety, Arthritis, [place]adder [place], [place] of [place] ([place], DDD (degenerative disc disease), cervical, Depression, Failed back syndrome, Fibromyalgia, GERD (gastroesophageal reflux disease), Headache, Heart murmur, Hyperlipemia, Hypertension, IBS (irritable bowel syndrome), Kidney failure, Renal insufficiency, RSD (reflex sympathetic dystrophy), Seizure ([place], Stomach ulcer, and Trigeminal neuralgia. 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 Ms. KNevilshas a current medication list which includes the following prescription(s): albuterol, bupropion, dicyclomine, escitalopram, fentanyl, [START ON 08/17/2020] fentanyl, [START ON 09/16/2020] fentanyl, emgality, methocarbamol, multiple minerals-vitamins, naloxone, olmesartan, ondansetron, pregabalin, rizatriptan, sucralfate, trazodone, and triamcinolone cream. She  reports that she has never smoked. She has never used smokeless tobacco. She reports that she does not drink alcohol and does not use drugs. [name] allergic to methadone, other, and tape.   HPI  Today, she is being contacted for a post-procedure assessment.   Post-Procedure Evaluation  Procedure (07/19/2020):   Type: Diagnostic Sacroiliac Joint Steroid Injection  And right piriformis injection #1 Region: Inferior Lumbosacral Region Level: PIIS (Posterior Inferior Iliac Spine) Laterality: Right-Side  Sedation: Please see nurses note.  Effectiveness during initial hour after procedure(Ultra-Short Term Relief): 50 %  Local anesthetic used: Long-acting (4-6 hours) Effectiveness: Defined as any analgesic  benefit obtained secondary to the administration of local anesthetics. This carries significant diagnostic value as to the etiological location, or anatomical origin, of the pain. Duration of benefit is expected to coincide with the duration of the local anesthetic used.  Effectiveness during initial 4-6 hours after procedure(Short-Term Relief): 50 %   Long-term benefit: Defined as any relief past the pharmacologic duration of the local anesthetics.  Effectiveness past the initial 6 hours after procedure(Long-Term Relief): 30 % (ongoing)   Current benefits: Defined as benefit that persist at this time.   Analgesia:  30% Function: Lindsay Austin reports improvement in function ROM: Back to baseline  Pharmacotherapy Assessment  Analgesic:  fentanyl patch  50 mcg an hour; 120 MME.   Monitoring: [place] PMP: PDMP reviewed during this encounter.       Pharmacotherapy: No side-effects or adverse reactions reported. Compliance: No problems identified. Effectiveness: Clinically acceptable. Plan: Refer to "POC". UDS:  Summary  Date Value Ref Range Status  05/11/2020 Note  Final    Comment:    ==================================================================== [employer] Select 13 (MW) ==================================================================== Test                             Result       Flag       Units  Drug Present and Declared for Prescription Verification   Fentanyl                       15           EXPECTED   ng/mg creat   Norfentanyl                    >424         EXPECTED   ng/mg creat    Source of fentanyl is a scheduled prescription  medication, including    IV, patch, and transmucosal formulations. Norfentanyl is an expected    metabolite of fentanyl.  ==================================================================== Test                      Result    Flag   Units      Ref Range   Creatinine              236              mg/dL      >=20 ==================================================================== Declared Medications:  The flagging and interpretation on this report are based on the  following declared medications.  Unexpected results may arise from  inaccuracies in the declared medications.   **Note: The testing scope of this panel includes these medications:   Fentanyl   **Note: The testing scope of this panel does not include the  following reported medications:   Acetaminophen  Albuterol  Bupropion (Wellbutrin SR)  Dicyclomine  Escitalopram  Galcanezumab (Emgality)  Methocarbamol  Multivitamin  Naloxone  Olmesartan (Benicar)  Ondansetron  Pregabalin  Rizatriptan  Sucralfate  Topical  Trazodone ==================================================================== For clinical consultation, please call [phone]. ====================================================================     Laboratory Chemistry Profile   Renal Lab Results  Component Value Date   BUN 16 08/31/2019   CREATININE 1.32 (H) 08/31/2019   LABCREA 71 08/29/2019   GFRAA 51 (L) 08/31/2019   GFRNONAA 44 (L) 08/31/2019     Hepatic Lab Results  Component Value Date   AST 32 08/29/2019   ALT 12 08/29/2019   ALBUMIN 4.1 08/29/2019  ALKPHOS 66 08/29/2019     Electrolytes Lab Results  Component Value Date   NA 142 08/31/2019   K 3.9 08/31/2019   CL 106 08/31/2019   CALCIUM 8.7 (L) 08/31/2019   MG 2.2 08/31/2019     Bone No results found for: VD25OH, VD125OH2TOT, SW5462VO3, JK0938HW2, 25OHVITD1, 25OHVITD2, 25OHVITD3, TESTOFREE, TESTOSTERONE   Inflammation (CRP: Acute Phase) (ESR: Chronic  Phase) No results found for: CRP, ESRSEDRATE, LATICACIDVEN     Note: Above Lab results reviewed.   Assessment  The primary encounter diagnosis was Sacroiliac joint dysfunction of right side. Diagnoses of Piriformis syndrome of right side, Chronic pain syndrome, and RSD (reflex sympathetic dystrophy) were also pertinent to this visit.  Plan of Care  Lindsay Austin has a current medication list which includes the following long-term medication(s): albuterol, bupropion, dicyclomine, escitalopram, olmesartan, rizatriptan, sucralfate, and trazodone.  Orders:  Orders Placed This Encounter  Procedures  . SACROILIAC JOINT INJECTION    Scheduling timeframe: (PRN procedure) Lindsay Austin will call when needed. Clinical indication: Low back pain, w/ or w/o groin pain. Sacroiliac joint pain Sedation: Usually done with sedation. (May be done without sedation if so desired by patient.) Requirements: NPO x 8 hrs.; Driver; Stop blood thinners.    Standing Status:   Standing    Number of Occurrences:   1    Standing Expiration Date:   08/16/2021    Scheduling Instructions:     Side: Bilateral     Sedation: Patient's choice.    Order Specific Question:   Where will this procedure be performed?    Answer:   [facility]  . TRIGGER POINT INJECTION    Area: Buttocks region (gluteal area) Indications: Piriformis muscle pain;  Bilateral piriformis-syndrome; piriformis muscle spasms (X93.716). CPT code: 20552    Scheduling Instructions:     Type: Myoneural block (TPI) of piriformis muscle.     Side:  Bilateral     Sedation: Patient's choice.     Timeframe: Today    Order Specific Question:   Where will this procedure be performed?    Answer:   [facility]   Follow-up plan:   Return for Keep sch. appt.   Recent Visits Date Type Provider Dept  07/19/20 Procedure visit [name], MD [facility]  07/06/20 Office Visit [name], MD [facility]  06/08/20  Telemedicine [name], MD [facility]  05/22/20 Procedure visit [name], MD [facility]  Showing recent visits within past 90 days and meeting all other requirements Today's Visits Date Type Provider Dept  08/16/20 Telemedicine [name], MD [facility]  Showing today's visits and meeting all other requirements Future Appointments Date Type Provider Dept  10/03/20 Appointment [name], MD [facility]  Showing future appointments within next 90 days and meeting all other requirements  I discussed the assessment and treatment plan with the patient. The patient was provided an opportunity to ask questions and all were answered. The patient agreed with the plan and demonstrated an understanding of the instructions.  Patient advised to call back or seek an in-person evaluation if the symptoms or condition worsens.  Duration of encounter:20 minutes.  Note by: [name], MD Date: 08/16/2020; Time: 1:19 PM

## 2020-09-13 ENCOUNTER — Ambulatory Visit: Payer: Medicaid Other | Admitting: Student in an Organized Health Care Education/Training Program

## 2020-10-03 ENCOUNTER — Encounter: Payer: Self-pay | Admitting: Student in an Organized Health Care Education/Training Program

## 2020-10-03 ENCOUNTER — Other Ambulatory Visit: Payer: Self-pay

## 2020-10-03 ENCOUNTER — Ambulatory Visit
Payer: Medicaid Other | Attending: Student in an Organized Health Care Education/Training Program | Admitting: Student in an Organized Health Care Education/Training Program

## 2020-10-03 DIAGNOSIS — Z981 Arthrodesis status: Secondary | ICD-10-CM | POA: Diagnosis present

## 2020-10-03 DIAGNOSIS — M47812 Spondylosis without myelopathy or radiculopathy, cervical region: Secondary | ICD-10-CM

## 2020-10-03 DIAGNOSIS — M533 Sacrococcygeal disorders, not elsewhere classified: Secondary | ICD-10-CM

## 2020-10-03 DIAGNOSIS — S7001XS Contusion of right hip, sequela: Secondary | ICD-10-CM | POA: Diagnosis present

## 2020-10-03 DIAGNOSIS — M47816 Spondylosis without myelopathy or radiculopathy, lumbar region: Secondary | ICD-10-CM

## 2020-10-03 DIAGNOSIS — T85192S Other mechanical complication of implanted electronic neurostimulator (electrode) of spinal cord, sequela: Secondary | ICD-10-CM

## 2020-10-03 DIAGNOSIS — G9050 Complex regional pain syndrome I, unspecified: Secondary | ICD-10-CM

## 2020-10-03 DIAGNOSIS — M4802 Spinal stenosis, cervical region: Secondary | ICD-10-CM

## 2020-10-03 DIAGNOSIS — Q761 Klippel-Feil syndrome: Secondary | ICD-10-CM | POA: Diagnosis present

## 2020-10-03 DIAGNOSIS — G894 Chronic pain syndrome: Secondary | ICD-10-CM | POA: Diagnosis present

## 2020-10-03 DIAGNOSIS — G5701 Lesion of sciatic nerve, right lower limb: Secondary | ICD-10-CM

## 2020-10-03 MED ORDER — METHOCARBAMOL 500 MG PO TABS: 750.0000 mg | ORAL_TABLET | Freq: Three times a day (TID) | ORAL | 2 refills | Status: AC | PRN

## 2020-10-03 MED ORDER — FENTANYL 50 MCG/HR TD PT72: 1.0000 | MEDICATED_PATCH | TRANSDERMAL | 0 refills | Status: DC

## 2020-10-03 MED ORDER — FENTANYL 50 MCG/HR TD PT72: 1.0000 | MEDICATED_PATCH | TRANSDERMAL | 0 refills | Status: AC

## 2020-10-03 MED ORDER — PREDNISONE 20 MG PO TABS: ORAL_TABLET | ORAL | 0 refills | Status: AC

## 2020-10-03 NOTE — Progress Notes (Signed)
PROVIDER NOTE: Information contained herein reflects review and annotations entered in association with encounter. Interpretation of such information and data should be left to medically-trained personnel. Information provided to patient can be located elsewhere in the medical record under "Patient Instructions". Document created using STT-dictation technology, any transcriptional errors that may result from process are unintentional.    Patient: Lindsay Austin  Service Category: E/M  Provider: [name], MD  DOB: 09-30-1960  DOS: 10/03/2020  Specialty: Interventional Pain Management  MRN: [id number]  Setting: Ambulatory outpatient  PCP: [name], PA  Type: Established Patient    Referring Provider: [name], PA  Location: Office  Delivery: Face-to-face     HPI  [name], a 60 y.o. year old female, is here today because of her No primary diagnosis found.. [name] primary complain today is Back Pain (Low and mid) Last encounter: My last encounter with her was on 07/19/2020. Pertinent problems: [name] has Hx of lumbosacral spine surgery; History of lumbar fusion (L5-S1 fusion); Failed spinal cord stimulator, sequela; Cervical fusion syndrome (C4-C7); Chronic pain syndrome; Major depressive disorder with current active episode; Lumbar spondylosis; Cervical spondylosis without myelopathy; and Spinal stenosis in cervical region on their pertinent problem list. Pain Assessment: Severity of Chronic pain is reported as a 7 /10. Location: Back Mid,Lower/radiates down lefs sometimes, radiates around to both hips, right is worse. Onset: More than a month ago. Quality: Burning,Constant,Tender. Timing: Constant. Modifying factor(s): lying down. Vitals:  height is '5\' 4"'  (1.626 m) and weight is 183 lb (83 kg). Her temperature is 96.9 F (36.1 C) (abnormal). Her blood pressure is 117/96 (abnormal) and her pulse is 93. Her respiration is 16 and oxygen saturation is 98%.   Reason for encounter:  medication management.    Increased low back pain that radiates down bilateral hips and into her groin, right greater than left.  History of L5-S1 fusion.  Patient has had diagnostic lumbar facet medial branch nerve blocks in January and then had SI joint and right piriformis injection done in March with limited response.  She states that she has having an increase in her whole body pain.  We discussed a steroid taper which she is interested in.  If steroid taper is not helpful, we discussed repeating lumbar facet medial branch nerve block.  Otherwise I will refill her fentanyl patch as below.  No change in dose.  UDS up-to-date and appropriate.  I will also refill her Robaxin.  I can take over her Lyrica as well and manage that as a part of her comprehensive pain regimen.  Pharmacotherapy Assessment   Analgesic:  fentanyl patch  50 mcg an hour; 120 MME.   Monitoring: [place] PMP: PDMP reviewed during this encounter.       Pharmacotherapy: No side-effects or adverse reactions reported. Compliance: No problems identified. Effectiveness: Clinically acceptable.  Dewayne Shorter, RN  10/03/2020  2:58 PM  Signed Nursing Pain Medication Assessment:  Safety precautions to be maintained throughout the outpatient stay will include: orient to surroundings, keep bed in low position, maintain call bell within reach at all times, provide assistance with transfer out of bed and ambulation.  Medication Inspection Compliance: Pill count conducted under aseptic conditions, in front of the patient. Neither the pills nor the bottle was removed from the patient's sight at any time. Once count was completed pills were immediately returned to the patient in their original bottle.  Medication: Fentanyl patch Pill/Patch Count: 6 of 10 pills remain Pill/Patch Appearance: Markings  consistent with prescribed medication Bottle Appearance: Standard pharmacy container. Clearly labeled. Filled Date: 05/ 25 / 2022 Last Medication  intake:  Day before yesterday    UDS:  Summary  Date Value Ref Range Status  05/11/2020 Note  Final    Comment:    ==================================================================== [employer] [facility] [employer] (MW) ==================================================================== Test                             Result       Flag       Units  Drug Present and Declared for Prescription Verification   Fentanyl                       15           EXPECTED   ng/mg creat   Norfentanyl                    >424         EXPECTED   ng/mg creat    Source of fentanyl is a scheduled prescription medication, including    IV, patch, and transmucosal formulations. Norfentanyl is an expected    metabolite of fentanyl.  ==================================================================== Test                      Result    Flag   Units      Ref Range   Creatinine              236              mg/dL      >=20 ==================================================================== Declared Medications:  The flagging and interpretation on this report are based on the  following declared medications.  Unexpected results may arise from  inaccuracies in the declared medications.   **Note: The testing scope of this panel includes these medications:   Fentanyl   **Note: The testing scope of this panel does not include the  following reported medications:   Acetaminophen  Albuterol  Bupropion (Wellbutrin SR)  Dicyclomine  Escitalopram  Galcanezumab (Emgality)  Methocarbamol  Multivitamin  Naloxone  Olmesartan (Benicar)  Ondansetron  Pregabalin  Rizatriptan  Sucralfate  Topical  Trazodone ==================================================================== For clinical consultation, please call [phone]. ====================================================================      ROS  Constitutional: Denies any fever or chills Gastrointestinal: No reported hemesis, hematochezia, vomiting,  or acute GI distress Musculoskeletal: Low back, bilateral hip, right greater than left SI joint and groin pain Neurological: No reported episodes of acute onset apraxia, aphasia, dysarthria, agnosia, amnesia, paralysis, loss of coordination, or loss of consciousness  Medication Review  Galcanezumab-gnlm, Multiple Minerals-Vitamins, albuterol, buPROPion, dicyclomine, escitalopram, fentaNYL, methocarbamol, naloxone, olmesartan, ondansetron, predniSONE, pregabalin, rizatriptan, sucralfate, traZODone, and triamcinolone cream  History Review  Allergy: [name] is allergic to methadone, other, and tape. Drug: [name]  reports no history of drug use. Alcohol:  reports no history of alcohol use. Tobacco:  reports that she has never smoked. She has never used smokeless tobacco. Social: [name]  reports that she has never smoked. She has never used smokeless tobacco. She reports that she does not drink alcohol and does not use drugs. Medical:  has a past medical history of Anxiety, Arthritis, Bladder cancer ([place]), Cancer of kidney ([place]), DDD (degenerative disc disease), cervical, Depression, Failed back syndrome, Fibromyalgia, GERD (gastroesophageal reflux disease), Headache, Heart murmur, Hyperlipemia, Hypertension, IBS (irritable bowel syndrome), Kidney failure, Renal insufficiency,  RSD (reflex sympathetic dystrophy), Seizure ([place]), Stomach ulcer, and Trigeminal neuralgia. Surgical: [name]  has a past surgical history that includes Transurethral resection of bladder tumor with gyrus (turbt-gyrus) (2012, 2013); Nephroureterectomy (2011); Back surgery (1995); Cholecystectomy; Brain surgery (2004); trigeminal neuralgia (2004); Spinal cord stimulator implant (2004); colonscopy (2006); Posterior cervical fusion/foraminotomy (Bilateral, 07/05/2019); Posterior cervical laminectomy (Bilateral, 07/05/2019); and Breast biopsy (Left, 2017). Family: family history includes Dementia in her father; Diabetes in her mother;  Heart disease in her mother; Kidney disease in her father.  Laboratory Chemistry Profile   Renal Lab Results  Component Value Date   BUN 16 08/31/2019   CREATININE 1.32 (H) 08/31/2019   LABCREA 71 08/29/2019   GFRAA 51 (L) 08/31/2019   GFRNONAA 44 (L) 08/31/2019     Hepatic Lab Results  Component Value Date   AST 32 08/29/2019   ALT 12 08/29/2019   ALBUMIN 4.1 08/29/2019   ALKPHOS 66 08/29/2019     Electrolytes Lab Results  Component Value Date   NA 142 08/31/2019   K 3.9 08/31/2019   CL 106 08/31/2019   CALCIUM 8.7 (L) 08/31/2019   MG 2.2 08/31/2019     Bone No results found for: VD25OH, VD125OH2TOT, NT6144RX5, QM0867YP9, 25OHVITD1, 25OHVITD2, 25OHVITD3, TESTOFREE, TESTOSTERONE   Inflammation (CRP: Acute Phase) (ESR: Chronic Phase) No results found for: CRP, ESRSEDRATE, LATICACIDVEN     Note: Above Lab results reviewed.   Physical Exam  General appearance: Well nourished, well developed, and well hydrated. In no apparent acute distress Mental status: Alert, oriented x 3 (person, place, & time)       Respiratory: No evidence of acute respiratory distress Eyes: PERLA Vitals: BP (!) 117/96   Pulse 93   Temp (!) 96.9 F (36.1 C)   Resp 16   Ht '5\' 4"'  (1.626 m)   Wt 183 lb (83 kg)   SpO2 98%   BMI 31.41 kg/m  BMI: Estimated body mass index is 31.41 kg/m as calculated from the following:   Height as of this encounter: '5\' 4"'  (1.626 m).   Weight as of this encounter: 183 lb (83 kg). Ideal: Ideal body weight: 54.7 kg (120 lb 9.5 oz) Adjusted ideal body weight: 66 kg (145 lb 8.9 oz)  Lumbar Spine Area Exam  Skin & Axial Inspection:Well healed scar from previous spine surgery detected Alignment:Symmetrical Functional JKD:TOIZ restricted ROMaffecting primarily the right Stability:No instability detected Muscle Tone/Strength:Functionally intact. No obvious neuro-muscular anomalies detected. Sensory (Neurological):Musculoskeletal pain  pattern Palpation:No palpable anomalies Provocative Tests: Hyperextension/rotation test:deferred today Lumbar quadrant test (Kemp's test):deferred today Lateral bending test:(+)due to pain. Patrick's Maneuver:(+)for right-sided S-I arthralgia FABER* test:(+)for right-sided S-I arthralgia S-I anterior distraction/compression test:(+)Right-sided  S-I lateral compression test:(+)S-I arthralgia/arthropathy S-I Thigh-thrust test:(+)S-I arthralgia/arthropathy S-I Gaenslen's test:deferred today *(Flexion, ABduction and External Rotation)  Gait & Posture Assessment  Ambulation:Patient came in today in a wheel chair Gait:Limited. Using assistive device to ambulate Posture:Difficulty standing up straight, due to pain  Lower Extremity Exam    Side:Right lower extremity  Side:Left lower extremity  Stability:No instability observed  Stability:No instability observed  Skin & Extremity Inspection:Skin color, temperature, and hair growth are WNL. No peripheral edema or cyanosis. No masses, redness, swelling, asymmetry, or associated skin lesions. No contractures.  Skin & Extremity Inspection:Skin color, temperature, and hair growth are WNL. No peripheral edema or cyanosis. No masses, redness, swelling, asymmetry, or associated skin lesions. No contractures.  Functional TIW:PYKD restricted ROMfor all joints of the lower extremity   Functional XIP:JASN restricted ROMfor all  joints of the lower extremity   Muscle Tone/Strength:Functionally intact. No obvious neuro-muscular anomalies detected.  Muscle Tone/Strength:Functionally intact. No obvious neuro-muscular anomalies detected.  Sensory (Neurological):Unimpaired  Sensory (Neurological):Unimpaired  DTR: Patellar:deferred today Achilles:deferred today Plantar:deferred today  DTR: Patellar:deferred  today Achilles:deferred today Plantar:deferred today  Palpation:No palpable anomalies  Palpation:No palpable anomalies   Assessment   Status Diagnosis  Having a Flare-up Having a Flare-up Having a Flare-up 1. Piriformis syndrome of right side   2. Sacroiliac joint dysfunction of right side   3. Lumbar spondylosis   4. Lumbar facet arthropathy   5. RSD (reflex sympathetic dystrophy)   6. Chronic pain syndrome   7. Cervical fusion syndrome (C4-T2)   8. Spinal stenosis in cervical region   9. History of lumbar fusion   10. Cervical spondylosis without myelopathy   11. Failed spinal cord stimulator, sequela   12. Contusion of right hip, sequela   13. Cervical facet joint syndrome       Plan of Care  [name] has a current medication list which includes the following long-term medication(s): albuterol, bupropion, dicyclomine, escitalopram, olmesartan, rizatriptan, sucralfate, and trazodone.  Pharmacotherapy (Medications Ordered): Meds ordered this encounter  Medications  . fentaNYL (DURAGESIC) 50 MCG/HR    Sig: Place 1 patch onto the skin every 3 (three) days. Most last 30 days.    Dispense:  10 patch    Refill:  0    [place] STOP ACT - Not applicable. Fill one day early if pharmacy is closed on scheduled refill date.  . fentaNYL (DURAGESIC) 50 MCG/HR    Sig: Place 1 patch onto the skin every 3 (three) days. Most last 30 days.    Dispense:  10 patch    Refill:  0    [place] STOP ACT - Not applicable. Fill one day early if pharmacy is closed on scheduled refill date.  . fentaNYL (DURAGESIC) 50 MCG/HR    Sig: Place 1 patch onto the skin every 3 (three) days. Most last 30 days.    Dispense:  10 patch    Refill:  0    [place] STOP ACT - Not applicable. Fill one day early if pharmacy is closed on scheduled refill date.  . methocarbamol (ROBAXIN) 500 MG tablet    Sig: Take 1.5 tablets (750 mg total) by mouth every 8 (eight) hours as needed for muscle spasms.    Dispense:  150  tablet    Refill:  2  . predniSONE (DELTASONE) 20 MG tablet    Sig: Take 3 tablets (60 mg total) by mouth daily with breakfast for 3 days, THEN 2 tablets (40 mg total) daily with breakfast for 3 days, THEN 1 tablet (20 mg total) daily with breakfast for 3 days.    Dispense:  18 tablet    Refill:  0  Continue Lyrica as prescribed Consider repeat lumbar facet medial branch nerve block if lower back pain is unimproved with steroid taper   Follow-up plan:   Return in about 3 months (around 01/11/2021) for Medication Management, in person.   Recent Visits Date Type Provider Dept  08/16/20 Telemedicine [name], MD [facility]  07/19/20 Procedure visit [name], MD [facility]  07/06/20 Office Visit [name], MD [facility]  Showing recent visits within past 90 days and meeting all other requirements Today's Visits Date Type Provider Dept  10/03/20 Office Visit [name], MD [facility]  Showing today's visits and meeting all other requirements Future  Appointments No visits were found meeting these conditions. Showing future appointments within next 90 days and meeting all other requirements  I discussed the assessment and treatment plan with the patient. The patient was provided an opportunity to ask questions and all were answered. The patient agreed with the plan and demonstrated an understanding of the instructions.  Patient advised to call back or seek an in-person evaluation if the symptoms or condition worsens.  Duration of encounter: 35 minutes.  Note by: [name], MD Date: 10/03/2020; Time: 3:12 PM

## 2020-10-03 NOTE — Progress Notes (Signed)
Nursing Pain Medication Assessment:  Safety precautions to be maintained throughout the outpatient stay will include: orient to surroundings, keep bed in low position, maintain call bell within reach at all times, provide assistance with transfer out of bed and ambulation.  Medication Inspection Compliance: Pill count conducted under aseptic conditions, in front of the patient. Neither the pills nor the bottle was removed from the patient's sight at any time. Once count was completed pills were immediately returned to the patient in their original bottle.  Medication: Fentanyl patch Pill/Patch Count: 6 of 10 pills remain Pill/Patch Appearance: Markings consistent with prescribed medication Bottle Appearance: Standard pharmacy container. Clearly labeled. Filled Date: 05/ 25 / 2022 Last Medication intake:  Day before yesterday

## 2020-11-06 ENCOUNTER — Telehealth: Payer: Self-pay | Admitting: Student in an Organized Health Care Education/Training Program

## 2020-11-06 NOTE — Telephone Encounter (Signed)
Patient called stating the steroids have not lessened her pain. It is getting worse. She wanted Dr. Holley Raring to know this. I explained that he is out of the office until next week. She is asking if a nurse can call her. She mentioned an MRI and I explained Dr. Holley Raring would have to order this when he came back.

## 2020-11-06 NOTE — Telephone Encounter (Signed)
Having pain in the lower back. Asking for MRI of the lower back.

## 2020-11-14 ENCOUNTER — Telehealth: Payer: Self-pay | Admitting: Student in an Organized Health Care Education/Training Program

## 2020-11-14 DIAGNOSIS — G894 Chronic pain syndrome: Secondary | ICD-10-CM

## 2020-11-14 DIAGNOSIS — Z981 Arthrodesis status: Secondary | ICD-10-CM

## 2020-11-14 DIAGNOSIS — M47816 Spondylosis without myelopathy or radiculopathy, lumbar region: Secondary | ICD-10-CM

## 2020-11-14 DIAGNOSIS — M5136 Other intervertebral disc degeneration, lumbar region: Secondary | ICD-10-CM

## 2020-11-14 NOTE — Telephone Encounter (Signed)
Patient called last week requesting MRI of the lower back.

## 2020-11-14 NOTE — Telephone Encounter (Signed)
Patient notified MRI ordered.

## 2020-11-14 NOTE — Telephone Encounter (Signed)
Patient called again, she is very unsteady on her feet, having problems walking. Has been pretty much having to lay in the bed since she fell last week. Would like to know if there is anything Dr. Holley Raring can do to help.

## 2020-12-05 ENCOUNTER — Ambulatory Visit: Admission: RE | Admit: 2020-12-05 | Payer: Medicaid Other | Source: Ambulatory Visit

## 2020-12-15 ENCOUNTER — Ambulatory Visit: Payer: Medicaid Other

## 2020-12-22 ENCOUNTER — Telehealth: Payer: Self-pay

## 2020-12-26 ENCOUNTER — Other Ambulatory Visit: Payer: Self-pay | Admitting: Family Medicine

## 2020-12-29 ENCOUNTER — Emergency Department
Admission: EM | Admit: 2020-12-29 | Discharge: 2020-12-29 | Disposition: A | Discharge status: Home / Self Care | Payer: Medicaid Other | Attending: Emergency Medicine | Admitting: Emergency Medicine

## 2020-12-29 ENCOUNTER — Emergency Department: Payer: Medicaid Other

## 2020-12-29 ENCOUNTER — Other Ambulatory Visit: Payer: Self-pay

## 2020-12-29 DIAGNOSIS — N189 Chronic kidney disease, unspecified: Secondary | ICD-10-CM | POA: Insufficient documentation

## 2020-12-29 DIAGNOSIS — R4182 Altered mental status, unspecified: Secondary | ICD-10-CM | POA: Diagnosis not present

## 2020-12-29 DIAGNOSIS — Z79899 Other long term (current) drug therapy: Secondary | ICD-10-CM | POA: Diagnosis not present

## 2020-12-29 DIAGNOSIS — I129 Hypertensive chronic kidney disease with stage 1 through stage 4 chronic kidney disease, or unspecified chronic kidney disease: Secondary | ICD-10-CM | POA: Diagnosis not present

## 2020-12-29 DIAGNOSIS — R110 Nausea: Secondary | ICD-10-CM | POA: Insufficient documentation

## 2020-12-29 DIAGNOSIS — Z85528 Personal history of other malignant neoplasm of kidney: Secondary | ICD-10-CM | POA: Diagnosis not present

## 2020-12-29 DIAGNOSIS — Z8551 Personal history of malignant neoplasm of bladder: Secondary | ICD-10-CM | POA: Diagnosis not present

## 2020-12-29 DIAGNOSIS — R42 Dizziness and giddiness: Secondary | ICD-10-CM | POA: Insufficient documentation

## 2020-12-29 LAB — POC URINE PREG, ED: Preg Test, Ur: NEGATIVE

## 2020-12-29 LAB — URINALYSIS, COMPLETE (UACMP) WITH MICROSCOPIC
Bilirubin Urine: NEGATIVE
Glucose, UA: NEGATIVE mg/dL
Hgb urine dipstick: NEGATIVE
Ketones, ur: NEGATIVE mg/dL
Leukocytes,Ua: NEGATIVE
Nitrite: NEGATIVE
Protein, ur: NEGATIVE mg/dL
Specific Gravity, Urine: 1.02 (ref 1.005–1.030)
pH: 5.5 (ref 5.0–8.0)

## 2020-12-29 LAB — BASIC METABOLIC PANEL
Anion gap: 5 (ref 5–15)
BUN: 13 mg/dL (ref 6–20)
CO2: 33 mmol/L — ABNORMAL HIGH (ref 22–32)
Calcium: 9.2 mg/dL (ref 8.9–10.3)
Chloride: 101 mmol/L (ref 98–111)
Creatinine, Ser: 1.44 mg/dL — ABNORMAL HIGH (ref 0.44–1.00)
GFR, Estimated: 42 mL/min — ABNORMAL LOW (ref >60)
Glucose, Bld: 88 mg/dL (ref 70–99)
Potassium: 4.3 mmol/L (ref 3.5–5.1)
Sodium: 139 mmol/L (ref 135–145)

## 2020-12-29 LAB — CBC
HCT: 42.6 % (ref 36.0–46.0)
Hemoglobin: 13.9 g/dL (ref 12.0–15.0)
MCH: 31 pg (ref 26.0–34.0)
MCHC: 32.6 g/dL (ref 30.0–36.0)
MCV: 94.9 fL (ref 80.0–100.0)
Platelets: 173 10*3/uL (ref 150–400)
RBC: 4.49 MIL/uL (ref 3.87–5.11)
RDW: 13.2 % (ref 11.5–15.5)
WBC: 5.2 10*3/uL (ref 4.0–10.5)
nRBC: 0 % (ref 0.0–0.2)

## 2020-12-29 LAB — TROPONIN I (HIGH SENSITIVITY): Troponin I (High Sensitivity): 2 ng/L (ref <18)

## 2020-12-29 MED ORDER — ONDANSETRON HCL 4 MG/2ML IJ SOLN
4.0000 mg | Freq: Once | INTRAMUSCULAR | Status: AC
  Administered 2020-12-29: 4 mg via INTRAVENOUS
  Filled 2020-12-29: qty 2

## 2020-12-29 MED ORDER — SODIUM CHLORIDE 0.9 % IV BOLUS
1000.0000 mL | Freq: Once | INTRAVENOUS | Status: AC
  Administered 2020-12-29: 1000 mL via INTRAVENOUS

## 2020-12-29 NOTE — ED Provider Notes (Signed)
[facility] Emergency Department Provider Note   ____________________________________________   Event Date/Time   First MD Initiated Contact with Patient 12/29/20 1354     (approximate)  I have reviewed the triage vital signs and the nursing notes.   HISTORY  Chief Complaint Near Syncope and Altered Mental Status    HPI Lindsay Austin is a 60 y.o. female with past medical history of hypertension, hyperlipidemia, CKD, and chronic pain syndrome who presents to the ED complaining of dizziness.  Patient reports that she has been feeling persistently dizzy and lightheaded since about 1230 yesterday.  She describes "feeling off" with a sensation of brain fog.  She states that she feels weak all over but denies any focal numbness or weakness.  She does not had any vision changes or speech changes.  She will feel like she is going to pass out at times, but she denies any pain in her chest or trouble breathing.  She is concerned about her kidney function due to her history of chronic kidney disease.  She denies any fevers, cough, abdominal pain, dysuria, vomiting, or diarrhea.  She does endorse feeling nauseous.        Past Medical History:  Diagnosis Date   Anxiety    Arthritis    Bladder cancer ([place])    Cancer of kidney ([place])    DDD (degenerative disc disease), cervical    Depression    Failed back syndrome    Fibromyalgia    GERD (gastroesophageal reflux disease)    Headache    migraines    Heart murmur    Hyperlipemia    Hypertension    not on any medications   IBS (irritable bowel syndrome)    Kidney failure    Renal insufficiency    RSD (reflex sympathetic dystrophy)    Seizure ([place])    Stomach ulcer    Trigeminal neuralgia     Patient Active Problem List   Diagnosis Date Noted   Piriformis syndrome of right side 02/15/2020   Sacroiliac joint dysfunction of right side 02/15/2020   Overdose opiate, accidental or unintentional, subsequent  encounter 09/01/2019   AKI (acute kidney injury) ([place]) 08/29/2019   Acute respiratory failure with hypoxia ([place]) 07/07/2019   HTN (hypertension) 07/07/2019   GERD (gastroesophageal reflux disease) 123456   Acute metabolic encephalopathy 123456   Hypoglycemia 07/07/2019   Gastric ulcer 07/07/2019   S/P cervical spinal fusion 07/05/2019   Contusion of right hip 06/03/2019   Spinal stenosis in cervical region 06/03/2019   Cervical spondylosis without myelopathy 09/30/2018   Preoperative testing 09/30/2018   Major depressive disorder with current active episode 07/23/2018   Lumbar spondylosis 07/23/2018   Hx of lumbosacral spine surgery 01/15/2018   History of lumbar fusion (L5-S1 fusion) 01/15/2018   Failed spinal cord stimulator, sequela 01/15/2018   Cervical fusion syndrome (C4-C7) 01/15/2018   Chronic pain syndrome 01/15/2018    Past Surgical History:  Procedure Laterality Date   [place]   x 7   BRAIN SURGERY  2004   BREAST BIOPSY Left 2017   benign w/ clip   CHOLECYSTECTOMY     colonscopy  2006   NEPHROURETERECTOMY  2011   POSTERIOR CERVICAL FUSION/FORAMINOTOMY Bilateral 07/05/2019   Procedure: C5-T2 POSTERIOR DECOMPRESSION AND FUSION;  Surgeon: Deetta Perla, MD;  Location: [facility] ORS;  Service: Neurosurgery;  Laterality: Bilateral;   POSTERIOR CERVICAL LAMINECTOMY Bilateral 07/05/2019   Procedure: C6-7 LAMINECTOMY;  Surgeon: Deetta Perla, MD;  Location: [facility]  ORS;  Service: Neurosurgery;  Laterality: Bilateral;   SPINAL CORD STIMULATOR IMPLANT  2004   TRANSURETHRAL RESECTION OF BLADDER TUMOR WITH GYRUS (TURBT-GYRUS)  2012, 2013   trigeminal neuralgia  2004    Prior to Admission medications   Medication Sig Start Date End Date Taking? Authorizing Provider  albuterol (VENTOLIN HFA) 108 (90 Base) MCG/ACT inhaler Inhale 2 puffs into the lungs every 4 (four) hours as needed for wheezing or shortness of breath.  01/22/19   [provider]  buPROPion  (WELLBUTRIN SR) 150 MG 12 hr tablet Take 150 mg by mouth 2 (two) times daily.    [provider]  dicyclomine (BENTYL) 20 MG tablet Take 20 mg by mouth 3 (three) times daily before meals.     [provider]  escitalopram (LEXAPRO) 20 MG tablet Take 20 mg by mouth daily. with food 01/22/19   [provider]  fentaNYL (DURAGESIC) 50 MCG/HR Place 1 patch onto the skin every 3 (three) days. Most last 30 days. 12/19/20 01/18/21  [name], MD  Galcanezumab-gnlm (EMGALITY) 120 MG/ML SOAJ Inject 120 mg into the skin every 28 (twenty-eight) days.  04/15/19   [provider]  methocarbamol (ROBAXIN) 500 MG tablet Take 1.5 tablets (750 mg total) by mouth every 8 (eight) hours as needed for muscle spasms. 10/03/20   [name], MD  Multiple Minerals-Vitamins (CALCIUM-MAGNESIUM-ZINC-D3 PO) Take by mouth daily.    [provider]  naloxone [facility]) nasal spray 4 mg/0.1 mL 1 actuation in one nostril x  1 dose. May repeat dose every 2 to 3 minutes until patient is responsive or EMS arrives 08/31/19   Jennye Boroughs, MD  olmesartan (BENICAR) 5 MG tablet Take 5 mg by mouth daily. 05/14/20   [provider]  ondansetron (ZOFRAN-ODT) 4 MG disintegrating tablet Take 4 mg by mouth every 8 (eight) hours as needed for nausea or vomiting.  12/10/17   [provider]  pregabalin (LYRICA) 75 MG capsule Take 75 mg by mouth 3 (three) times daily.    [provider]  rizatriptan (MAXALT) 10 MG tablet Take 10 mg by mouth as needed for migraine.  04/15/19   [provider]  sucralfate (CARAFATE) 1 g tablet Take 1 g by mouth 2 (two) times daily.    [provider]  traZODone (DESYREL) 50 MG tablet Take 100 mg by mouth at bedtime.  12/10/17   [provider]  triamcinolone cream (KENALOG) 0.1 % Apply 1 application topically 2 (two) times daily.    [provider]    Allergies Methadone, Other, and Tape  Family History   Problem Relation Age of Onset   Diabetes Mother    Heart disease Mother    Dementia Father    Kidney disease Father    Breast cancer Neg Hx     Social History Social History   Tobacco Use   Smoking status: Never   Smokeless tobacco: Never  Substance Use Topics   Alcohol use: Never   Drug use: Never    Review of Systems  Constitutional: No fever/chills Eyes: No visual changes. ENT: No sore throat. Cardiovascular: Denies chest pain.  Positive for dizziness and lightheadedness. Respiratory: Denies shortness of breath. Gastrointestinal: No abdominal pain.  Positive for nausea, no vomiting.  No diarrhea.  No constipation. Genitourinary: Negative for dysuria. Musculoskeletal: Negative for back pain. Skin: Negative for rash. Neurological: Negative for headaches, focal weakness or numbness.  ____________________________________________   PHYSICAL EXAM:  VITAL SIGNS: ED Triage Vitals  Enc Vitals Group     BP 12/29/20 1045 121/73     Pulse Rate 12/29/20 1045 62     Resp 12/29/20 1045 20     Temp 12/29/20 1045 98.8 F (37.1 C)     Temp Source 12/29/20 1045 Oral     SpO2 12/29/20 1045 99 %     Weight 12/29/20 1045 186 lb (84.4 kg)     Height 12/29/20 1045 '5\' 4"'$  (1.626 m)     Head Circumference --      Peak Flow --      Pain Score 12/29/20 1054 0     Pain Loc --      Pain Edu? --      Excl. in [place]? --     Constitutional: Alert and oriented. Eyes: Conjunctivae are normal. Head: Atraumatic. Nose: No congestion/rhinnorhea. Mouth/Throat: Mucous membranes are moist. Neck: Normal ROM Cardiovascular: Normal rate, regular rhythm. Grossly normal heart sounds.  2+ radial pulses bilaterally. Respiratory: Normal respiratory effort.  No retractions. Lungs CTAB. Gastrointestinal: Soft and nontender. No distention. Genitourinary: deferred Musculoskeletal: No lower extremity tenderness nor edema. Neurologic:  Normal speech and language. No gross focal neurologic deficits are  appreciated. Skin:  Skin is warm, dry and intact. No rash noted. Psychiatric: Mood and affect are normal. Speech and behavior are normal.  ____________________________________________   LABS (all labs ordered are listed, but only abnormal results are displayed)  Labs Reviewed  BASIC METABOLIC PANEL - Abnormal; Notable for the following components:      Result Value   CO2 33 (*)    Creatinine, Ser 1.44 (*)    GFR, Estimated 42 (*)    All other components within normal limits  CBC  URINALYSIS, COMPLETE (UACMP) WITH MICROSCOPIC  CBG MONITORING, ED  POC URINE PREG, ED  TROPONIN I (HIGH SENSITIVITY)   ____________________________________________  EKG  ED ECG REPORT I, Blake Divine, the attending physician, personally viewed and interpreted this ECG.   Date: 12/29/2020  EKG Time: 10:38  Rate: 65  Rhythm: normal sinus rhythm  Axis: Normal  Intervals:none  ST&T Change: None   PROCEDURES  Procedure(s) performed (including Critical Care):  Procedures   ____________________________________________   INITIAL IMPRESSION / ASSESSMENT AND PLAN / ED COURSE      60 year old female with past medical history of hypertension, hyperlipidemia, CKD, and chronic pain syndrome who presents to the ED for dizziness, lightheadedness, and global weakness starting yesterday at 1230.  She has a nonfocal neurologic exam and I have overall low suspicion for acute stroke, she is outside the window for tPA or other acute intervention.  Given her described "brain fog", we will check CT head.  Labs thus far are reassuring, renal function stable compared to previous.  EKG shows no evidence of arrhythmia or ischemia, we will add on troponin and observe on cardiac monitor.  UA is also pending although patient denies any urinary symptoms.  We will hydrate with IV fluids and treat with IV Zofran.  Troponin within normal limits and no events noted on cardiac monitor.  CT head is negative for acute  process, does show what appears to be small remote thalamic stroke, which seems unlikely to explain patient's symptoms.  She is appropriate for discharge home with outpatient follow-up, has established care with a neurologist and she was counseled to follow-up with them.  She was also counseled to follow-up with her PCP.  She was counseled to return to the ED for new worsening symptoms, patient agrees with plan.  ____________________________________________   FINAL CLINICAL IMPRESSION(S) / ED DIAGNOSES  Final diagnoses:  Lightheadedness  Nausea     ED Discharge Orders     None        Note:  This document was prepared using Dragon voice recognition software and may include unintentional dictation errors.    Blake Divine, MD 12/29/20 [phone]

## 2020-12-29 NOTE — ED Triage Notes (Addendum)
Pt arrived to ed via pov form home with c/o feeling dizzy/lightheaded like she is going to pass out starting yesterday. Pt reports CKD, and concerned about increased confusion and feeling like brain is "cloudy". NAD noted at this time in triage pt a&o x 4.

## 2021-01-11 ENCOUNTER — Encounter: Payer: Medicaid Other | Admitting: Student in an Organized Health Care Education/Training Program

## 2021-01-17 ENCOUNTER — Other Ambulatory Visit: Payer: Self-pay | Admitting: Physician Assistant

## 2021-01-17 DIAGNOSIS — R200 Anesthesia of skin: Secondary | ICD-10-CM

## 2021-01-17 DIAGNOSIS — G479 Sleep disorder, unspecified: Secondary | ICD-10-CM

## 2021-01-17 DIAGNOSIS — N1832 Chronic kidney disease, stage 3b: Secondary | ICD-10-CM

## 2021-01-17 DIAGNOSIS — R42 Dizziness and giddiness: Secondary | ICD-10-CM

## 2021-01-17 DIAGNOSIS — R519 Headache, unspecified: Secondary | ICD-10-CM

## 2021-01-17 DIAGNOSIS — G8929 Other chronic pain: Secondary | ICD-10-CM

## 2021-01-17 DIAGNOSIS — R202 Paresthesia of skin: Secondary | ICD-10-CM

## 2021-01-18 ENCOUNTER — Ambulatory Visit
Payer: Medicaid Other | Attending: Student in an Organized Health Care Education/Training Program | Admitting: Student in an Organized Health Care Education/Training Program

## 2021-01-18 ENCOUNTER — Encounter: Payer: Self-pay | Admitting: Student in an Organized Health Care Education/Training Program

## 2021-01-18 ENCOUNTER — Other Ambulatory Visit: Payer: Self-pay

## 2021-01-18 VITALS — BP 123/88 | HR 68 | Temp 97.2°F | Resp 16 | Ht 64.0 in | Wt 190.0 lb

## 2021-01-18 DIAGNOSIS — M47816 Spondylosis without myelopathy or radiculopathy, lumbar region: Secondary | ICD-10-CM | POA: Diagnosis present

## 2021-01-18 DIAGNOSIS — G43709 Chronic migraine without aura, not intractable, without status migrainosus: Secondary | ICD-10-CM | POA: Diagnosis present

## 2021-01-18 DIAGNOSIS — Z981 Arthrodesis status: Secondary | ICD-10-CM

## 2021-01-18 DIAGNOSIS — Q761 Klippel-Feil syndrome: Secondary | ICD-10-CM | POA: Diagnosis present

## 2021-01-18 DIAGNOSIS — S7001XS Contusion of right hip, sequela: Secondary | ICD-10-CM | POA: Diagnosis present

## 2021-01-18 DIAGNOSIS — M4802 Spinal stenosis, cervical region: Secondary | ICD-10-CM

## 2021-01-18 DIAGNOSIS — G9050 Complex regional pain syndrome I, unspecified: Secondary | ICD-10-CM | POA: Diagnosis present

## 2021-01-18 DIAGNOSIS — T85192S Other mechanical complication of implanted electronic neurostimulator (electrode) of spinal cord, sequela: Secondary | ICD-10-CM

## 2021-01-18 DIAGNOSIS — G5701 Lesion of sciatic nerve, right lower limb: Secondary | ICD-10-CM

## 2021-01-18 DIAGNOSIS — M47812 Spondylosis without myelopathy or radiculopathy, cervical region: Secondary | ICD-10-CM

## 2021-01-18 DIAGNOSIS — G894 Chronic pain syndrome: Secondary | ICD-10-CM | POA: Diagnosis present

## 2021-01-18 DIAGNOSIS — M533 Sacrococcygeal disorders, not elsewhere classified: Secondary | ICD-10-CM | POA: Diagnosis not present

## 2021-01-18 MED ORDER — FENTANYL 50 MCG/HR TD PT72: 1.0000 | MEDICATED_PATCH | TRANSDERMAL | 0 refills | Status: AC

## 2021-01-18 MED ORDER — FENTANYL 50 MCG/HR TD PT72: 1.0000 | MEDICATED_PATCH | TRANSDERMAL | 0 refills | Status: DC

## 2021-01-18 NOTE — Progress Notes (Signed)
Nursing Pain Medication Assessment:  Safety precautions to be maintained throughout the outpatient stay will include: orient to surroundings, keep bed in low position, maintain call bell within reach at all times, provide assistance with transfer out of bed and ambulation.  Medication Inspection Compliance: Pill count conducted under aseptic conditions, in front of the patient. Neither the pills nor the bottle was removed from the patient's sight at any time. Once count was completed pills were immediately returned to the patient in their original bottle.  Medication: Fentanyl patch Pill/Patch Count:  0 of 5 pills remain Pill/Patch Appearance: Markings consistent with prescribed medication Bottle Appearance: Standard pharmacy container. Clearly labeled. Filled Date: 8 / 23 / 2022 Last Medication intake:  Today

## 2021-01-18 NOTE — Patient Instructions (Signed)
Discuss what needs to  be done to get L-MRI scheduled with [facility] (appeal, letter, etc) Plan for botox for migraine management

## 2021-01-18 NOTE — Progress Notes (Signed)
PROVIDER NOTE: Information contained herein reflects review and annotations entered in association with encounter. Interpretation of such information and data should be left to medically-trained personnel. Information provided to patient can be located elsewhere in the medical record under "Patient Instructions". Document created using STT-dictation technology, any transcriptional errors that may result from process are unintentional.    Patient: Lindsay Austin  Service Category: E/M  Provider: [name], MD  DOB: 04-22-1961  DOS: 01/18/2021  Specialty: Interventional Pain Management  MRN: [id number]  Setting: Ambulatory outpatient  PCP: [name], PA  Type: Established Patient    Referring Provider: [name], PA  Location: Office  Delivery: Face-to-face     HPI  Lindsay Austin, a 60 y.o. year old female, is here today because of her Chronic migraine without aura without status migrainosus, not intractable [G43.709]. [name] primary complain today is Back Pain ([place]) Last encounter: My last encounter with her was on 10/03/2020. Pertinent problems: Ms. Lyvers has Hx of lumbosacral spine surgery; History of lumbar fusion (L5-S1 fusion); Failed spinal cord stimulator, sequela; Cervical fusion syndrome (C4-C7); Chronic pain syndrome; Major depressive disorder with current active episode; Lumbar spondylosis; Cervical spondylosis without myelopathy; and Spinal stenosis in cervical region on their pertinent problem list. Pain Assessment: Severity of Chronic pain is reported as a 5 /10. Location: Back Mid, Lower/hips bilateral arond to groin. Onset: More than a month ago. Quality: Aching, Sharp, Burning. Timing: Intermittent. Modifying factor(s): patches, rest heat,body pillow. Vitals:  height is _0  (1.626 m) and weight is 190 lb (86.2 kg). Her temperature is 97.2 F (36.2 C) (abnormal). Her blood pressure is 123/88 and her pulse is 68. Her respiration is 16.   Reason for encounter:  medication management.    -hx of recent stroke- no motor deficits- started on Plavix -worsening migraines, is being managed by neurology.  Is on Emgality as well as Maxalt as needed for migraines.  These are not effective.  Neurology has recommended that she trial Botox injections.  She has daily headaches.  Than 18 headache days a month.  She does have a associated photophobia and nausea with these migraines at times.  Recommend Botox treatment.   Pharmacotherapy Assessment   Analgesic:  fentanyl patch  50 mcg an hour; 120 MME.   Monitoring: [place] PMP: PDMP reviewed during this encounter.       Pharmacotherapy: No side-effects or adverse reactions reported. Compliance: No problems identified. Effectiveness: Clinically acceptable.  Ignatius Specking, RN  01/18/2021  9:47 AM  Sign when Signing Visit Nursing Pain Medication Assessment:  Safety precautions to be maintained throughout the outpatient stay will include: orient to surroundings, keep bed in low position, maintain call bell within reach at all times, provide assistance with transfer out of bed and ambulation.  Medication Inspection Compliance: Pill count conducted under aseptic conditions, in front of the patient. Neither the pills nor the bottle was removed from the patient's sight at any time. Once count was completed pills were immediately returned to the patient in their original bottle.  Medication: Fentanyl patch Pill/Patch Count:  0 of 5 pills remain Pill/Patch Appearance: Markings consistent with prescribed medication Bottle Appearance: Standard pharmacy container. Clearly labeled. Filled Date: 8 / 23 / 2022 Last Medication intake:  Today      UDS:  Summary  Date Value Ref Range Status  05/11/2020 Note  Final    Comment:    ==================================================================== [employer] Select 13 (MW) ==================================================================== Test  Result       Flag       Units  Drug Present and Declared for Prescription Verification   Fentanyl                       15           EXPECTED   ng/mg creat   Norfentanyl                    >424         EXPECTED   ng/mg creat    Source of fentanyl is a scheduled prescription medication, including    IV, patch, and transmucosal formulations. Norfentanyl is an expected    metabolite of fentanyl.  ==================================================================== Test                      Result    Flag   Units      Ref Range   Creatinine              236              mg/dL      >=20 ==================================================================== Declared Medications:  The flagging and interpretation on this report are based on the  following declared medications.  Unexpected results may arise from  inaccuracies in the declared medications.   **Note: The testing scope of this panel includes these medications:   Fentanyl   **Note: The testing scope of this panel does not include the  following reported medications:   Acetaminophen  Albuterol  Bupropion (Wellbutrin SR)  Dicyclomine  Escitalopram  Galcanezumab (Emgality)  Methocarbamol  Multivitamin  Naloxone  Olmesartan (Benicar)  Ondansetron  Pregabalin  Rizatriptan  Sucralfate  Topical  Trazodone ==================================================================== For clinical consultation, please call [phone]. ====================================================================      ROS  Constitutional: Denies any fever or chills Gastrointestinal: No reported hemesis, hematochezia, vomiting, or acute GI distress Musculoskeletal:  Low back, bilateral hip, right greater than left SI joint and groin pain Neurological: No reported episodes of acute onset apraxia, aphasia, dysarthria, agnosia, amnesia, paralysis, loss of coordination, or loss of consciousness  Medication Review  Galcanezumab-gnlm, Multiple  Minerals-Vitamins, albuterol, buPROPion, clopidogrel, dicyclomine, escitalopram, fentaNYL, methocarbamol, naloxone, olmesartan, ondansetron, pregabalin, rizatriptan, sucralfate, traZODone, and triamcinolone cream  History Review  Allergy: Ms. Cammarata is allergic to methadone, other, and tape. Drug: Ms. Siefken  reports no history of drug use. Alcohol:  reports no history of alcohol use. Tobacco:  reports that she has never smoked. She has never used smokeless tobacco. Social: Ms. Piloto  reports that she has never smoked. She has never used smokeless tobacco. She reports that she does not drink alcohol and does not use drugs. Medical:  has a past medical history of Anxiety, Arthritis, Bladder cancer ([place]), Cancer of kidney ([place]), DDD (degenerative disc disease), cervical, Depression, Failed back syndrome, Fibromyalgia, GERD (gastroesophageal reflux disease), Headache, Heart murmur, Hyperlipemia, Hypertension, IBS (irritable bowel syndrome), Kidney failure, Renal insufficiency, RSD (reflex sympathetic dystrophy), Seizure ([place]), Stomach ulcer, and Trigeminal neuralgia. Surgical: Ms. Mausolf  has a past surgical history that includes Transurethral resection of bladder tumor with gyrus (turbt-gyrus) (2012, 2013); Nephroureterectomy (2011); Back surgery (1995); Cholecystectomy; Brain surgery (2004); trigeminal neuralgia (2004); Spinal cord stimulator implant (2004); colonscopy (2006); Posterior cervical fusion/foraminotomy (Bilateral, 07/05/2019); Posterior cervical laminectomy (Bilateral, 07/05/2019); and Breast biopsy (Left, 2017). Family: family history includes Dementia in her father; Diabetes in her mother; Heart disease in her mother;  Kidney disease in her father.  Laboratory Chemistry Profile   Renal Lab Results  Component Value Date   BUN 13 12/29/2020   CREATININE 1.44 (H) 12/29/2020   LABCREA 71 08/29/2019   GFRAA 51 (L) 08/31/2019   GFRNONAA 42 (L) 12/29/2020     Hepatic Lab Results  Component Value  Date   AST 32 08/29/2019   ALT 12 08/29/2019   ALBUMIN 4.1 08/29/2019   ALKPHOS 66 08/29/2019     Electrolytes Lab Results  Component Value Date   NA 139 12/29/2020   K 4.3 12/29/2020   CL 101 12/29/2020   CALCIUM 9.2 12/29/2020   MG 2.2 08/31/2019     Bone No results found for: VD25OH, VD125OH2TOT, DU2025KY7, CW2376EG3, 25OHVITD1, 25OHVITD2, 25OHVITD3, TESTOFREE, TESTOSTERONE   Inflammation (CRP: Acute Phase) (ESR: Chronic Phase) No results found for: CRP, ESRSEDRATE, LATICACIDVEN     Note: Above Lab results reviewed.   Physical Exam  General appearance: Well nourished, well developed, and well hydrated. In no apparent acute distress Mental status: Alert, oriented x 3 (person, place, & time)       Respiratory: No evidence of acute respiratory distress Eyes: PERLA Vitals: BP 123/88   Pulse 68   Temp (!) 97.2 F (36.2 C)   Resp 16   Ht _0  (1.626 m)   Wt 190 lb (86.2 kg)   BMI 32.61 kg/m  BMI: Estimated body mass index is 32.61 kg/m as calculated from the following:   Height as of this encounter: _1  (1.626 m).   Weight as of this encounter: 190 lb (86.2 kg). Ideal: Ideal body weight: 54.7 kg (120 lb 9.5 oz) Adjusted ideal body weight: 67.3 kg (148 lb 5.7 oz)  Lumbar Spine Area Exam  Skin & Axial Inspection: Well healed scar from previous spine surgery detected Alignment: Symmetrical Functional ROM: Pain restricted ROM affecting primarily the right Stability: No instability detected Muscle Tone/Strength: Functionally intact. No obvious neuro-muscular anomalies detected. Sensory (Neurological): Musculoskeletal pain pattern Palpation: No palpable anomalies       Provocative Tests: Hyperextension/rotation test: deferred today       Lumbar quadrant test (Kemp's test): deferred today       Lateral bending test: (+) due to pain. Patrick's Maneuver: (+) for right-sided S-I arthralgia             FABER* test: (+) for right-sided S-I arthralgia             S-I  anterior distraction/compression test: (+) Right-sided       S-I lateral compression test: (+)   S-I arthralgia/arthropathy S-I Thigh-thrust test: (+)   S-I arthralgia/arthropathy S-I Gaenslen's test: deferred today         *(Flexion, ABduction and External Rotation)   Gait & Posture Assessment  Ambulation: Patient came in today in a wheel chair Gait: Limited. Using assistive device to ambulate Posture: Difficulty standing up straight, due to pain    Lower Extremity Exam      Side: Right lower extremity   Side: Left lower extremity  Stability: No instability observed           Stability: No instability observed          Skin & Extremity Inspection: Skin color, temperature, and hair growth are WNL. No peripheral edema or cyanosis. No masses, redness, swelling, asymmetry, or associated skin lesions. No contractures.   Skin & Extremity Inspection: Skin color, temperature, and hair growth are WNL. No peripheral edema or cyanosis. No masses, redness,  swelling, asymmetry, or associated skin lesions. No contractures.  Functional ROM: Pain restricted ROM for all joints of the lower extremity           Functional ROM: Pain restricted ROM for all joints of the lower extremity          Muscle Tone/Strength: Functionally intact. No obvious neuro-muscular anomalies detected.   Muscle Tone/Strength: Functionally intact. No obvious neuro-muscular anomalies detected.  Sensory (Neurological): Unimpaired         Sensory (Neurological): Unimpaired        DTR: Patellar: deferred today Achilles: deferred today Plantar: deferred today   DTR: Patellar: deferred today Achilles: deferred today Plantar: deferred today  Palpation: No palpable anomalies   Palpation: No palpable anomalies    Assessment   Status Diagnosis  Having a Flare-up Having a Flare-up Having a Flare-up 1. Chronic migraine without aura without status migrainosus, not intractable   2. Piriformis syndrome of right side   3. Sacroiliac  joint dysfunction of right side   4. Lumbar spondylosis   5. Lumbar facet arthropathy   6. RSD (reflex sympathetic dystrophy)   7. Chronic pain syndrome   8. Cervical fusion syndrome (C4-T2)   9. Spinal stenosis in cervical region   10. History of lumbar fusion   11. Cervical spondylosis without myelopathy   12. Failed spinal cord stimulator, sequela   13. Contusion of right hip, sequela   14. Cervical facet joint syndrome       Plan of Care  Ms. Autym Siess has a current medication list which includes the following long-term medication(s): albuterol, bupropion, dicyclomine, escitalopram, olmesartan, rizatriptan, sucralfate, and trazodone.  Pharmacotherapy (Medications Ordered): Meds ordered this encounter  Medications   fentaNYL (DURAGESIC) 50 MCG/HR    Sig: Place 1 patch onto the skin every 3 (three) days. Most last 30 days.    Dispense:  10 patch    Refill:  0    [place] STOP ACT - Not applicable. Fill one day early if pharmacy is closed on scheduled refill date.   fentaNYL (DURAGESIC) 50 MCG/HR    Sig: Place 1 patch onto the skin every 3 (three) days. Most last 30 days.    Dispense:  10 patch    Refill:  0    [place] STOP ACT - Not applicable. Fill one day early if pharmacy is closed on scheduled refill date.   fentaNYL (DURAGESIC) 50 MCG/HR    Sig: Place 1 patch onto the skin every 3 (three) days. Most last 30 days.    Dispense:  10 patch    Refill:  0    [place] STOP ACT - Not applicable. Fill one day early if pharmacy is closed on scheduled refill date.   Orders Placed This Encounter  Procedures   Chemo Denervation    Standing Status:   Future    Standing Expiration Date:   01/18/2022    Scheduling Instructions:     Botox for migraines     Continue Lyrica as prescribed: 75 mg TID   Follow-up plan:   Return in about 3 months (around 04/17/2021) for Medication Management, in person.   Recent Visits No visits were found meeting these conditions. Showing recent visits within  past 90 days and meeting all other requirements Today's Visits Date Type Provider Dept  01/18/21 Office Visit [name], MD [facility]  Showing today's visits and meeting all other requirements Future Appointments Date Type Provider Dept  04/17/21 Appointment [name], MD [facility]  Showing future appointments within next 90 days and meeting all other requirements I discussed the assessment and treatment plan with the patient. The patient was provided an opportunity to ask questions and all were answered. The patient agreed with the plan and demonstrated an understanding of the instructions.  Patient advised to call back or seek an in-person evaluation if the symptoms or condition worsens.  Duration of encounter: 35 minutes.  Note by: [name], MD Date: 01/18/2021; Time: 10:40 AM

## 2021-01-31 ENCOUNTER — Ambulatory Visit: Payer: Medicaid Other

## 2021-02-09 ENCOUNTER — Ambulatory Visit
Admission: RE | Admit: 2021-02-09 | Discharge: 2021-02-09 | Disposition: A | Discharge status: Home / Self Care | Payer: Medicaid Other | Source: Ambulatory Visit | Attending: Physician Assistant | Admitting: Physician Assistant

## 2021-02-09 DIAGNOSIS — N1832 Chronic kidney disease, stage 3b: Secondary | ICD-10-CM | POA: Diagnosis present

## 2021-02-09 DIAGNOSIS — G479 Sleep disorder, unspecified: Secondary | ICD-10-CM | POA: Insufficient documentation

## 2021-02-09 DIAGNOSIS — G8929 Other chronic pain: Secondary | ICD-10-CM

## 2021-02-09 DIAGNOSIS — R519 Headache, unspecified: Secondary | ICD-10-CM | POA: Diagnosis present

## 2021-02-09 DIAGNOSIS — R42 Dizziness and giddiness: Secondary | ICD-10-CM | POA: Diagnosis present

## 2021-02-09 DIAGNOSIS — R202 Paresthesia of skin: Secondary | ICD-10-CM | POA: Insufficient documentation

## 2021-02-09 DIAGNOSIS — R200 Anesthesia of skin: Secondary | ICD-10-CM | POA: Diagnosis present

## 2021-02-14 ENCOUNTER — Encounter: Payer: Self-pay | Admitting: Student in an Organized Health Care Education/Training Program

## 2021-02-14 ENCOUNTER — Other Ambulatory Visit: Payer: Self-pay

## 2021-02-14 ENCOUNTER — Ambulatory Visit
Payer: Medicaid Other | Attending: Student in an Organized Health Care Education/Training Program | Admitting: Student in an Organized Health Care Education/Training Program

## 2021-02-14 DIAGNOSIS — G43709 Chronic migraine without aura, not intractable, without status migrainosus: Secondary | ICD-10-CM | POA: Insufficient documentation

## 2021-02-14 MED ORDER — SODIUM CHLORIDE (PF) 0.9 % IJ SOLN
INTRAMUSCULAR | Status: AC
  Filled 2021-02-14: qty 10

## 2021-02-14 MED ORDER — ONABOTULINUMTOXINA 100 UNITS IJ SOLR
155.0000 [IU] | Freq: Once | INTRAMUSCULAR | Status: AC
  Administered 2021-02-14: 155 [IU] via INTRADERMAL
  Filled 2021-02-14: qty 155

## 2021-02-14 NOTE — Patient Instructions (Signed)
Botulinum Toxin Cosmetic Injection Botulinum toxin injection is a procedure to soften lines and wrinkles in the face. Botulinum toxin is produced by a bacterium called Clostridium botulinum. The toxin is injected into muscles with a very thin needle. The toxin works by blocking nerve impulses to specific muscles. This weakens and paralyzes those muscles for a short time, which reduces the lines of facial expression. Tell a health care provider about: Any allergies you have, especially allergies to eggs or milk protein. All medicines you are taking, including antibiotics, vitamins, herbs, eye drops, creams, and over-the-counter medicines. Any problems you or family members have had with anesthetic medicines. Any blood disorders you have. Any surgeries you have had. Any medical conditions you have, including neurological disorders, hyperthyroidism, lung disease, or heart disease. Whether you are pregnant, may be pregnant, or are breastfeeding. What are the risks? Generally, this is a safe procedure. However, complications may occur. Common complications include pain, swelling, and bruising at the injection sites. Other problems may include: Pain in the neck or jaw. Allergic reaction to the toxin. Infection. Drooping of the eyebrow or eyelid. Double or blurred vision. Changes in voice or speech. Losing the ability to close one or both eyes. Trouble swallowing. What happens before the procedure? Ask your health care provider about: Changing or stopping your regular medicines. This is especially important if you are taking diabetes medicines or blood thinners. Taking medicines such as aspirin and ibuprofen. These medicines can thin your blood. Do not take these medicines unless your health care provider tells you to take them. Taking over-the-counter medicines, vitamins, herbs, and supplements. Do not drink alcohol if: Your health care provider tells you not to drink. You are pregnant, may be  pregnant, or are planning to become pregnant. If you drink alcohol: Limit how much you use to: 0-1 drink a day for women. 0-2 drinks a day for men. Be aware of how much alcohol is in your drink. In the [place] one drink equals one 12 oz bottle of beer (355 mL), one 5 oz glass of wine (148 mL), or one 1 oz glass of hard liquor (44 mL). What happens during the procedure?  The treatment site may have ice applied or you may be given a medicine to numb the area (local anesthetic) to reduce discomfort. Your health care provider will inject small amounts of the toxin into specific muscles. The number of injections that you get will depend on your specific condition and the area that is being treated. The procedure may vary among health care providers and hospitals. What can I expect after the procedure? After your procedure, it is common to have: Mild pain, soreness, swelling, itching, or redness around the injection sites. Redness may last for 1-2 days. In rare cases, it may last longer than 2 days. Small bruises around the injection sites. These will go away in a few days. Bumps or marks from the needle. These will go away within a few hours. Brief numbness near the injection sites. Headache. This is rare in cosmetic treatment. The results of your treatment may take up to 14 days fully show, although many people will see the benefits in 3-5 days after treatment. In most people, the benefits last about 3-4 months. The effectiveness of Botox will last longer with follow-up treatments. Follow these instructions at home: Relieving pain and discomfort  Take over-the-counter and prescription medicines only as told by your doctor. If directed, apply ice to the injected area: Put ice in a plastic  bag. Place a towel between your skin and the bag. Leave the ice on for 20 minutes, 2-3 times per day. General instructions Do not lie down for 4 hours after treatment or as told by your health care  provider. Do not rub the injected area for at least 4 hours after the procedure. This can spread the botulinum toxin to surrounding muscles. Depending on the injection site: Your health care provider may ask you to avoid moving the muscles in the injected area. Follow instructions from your health care provider. Your health care provider may tell you to frown or squint regularly. You may need to do these facial exercises every 15 minutes for 1 hour after treatment, or as told by your health care provider. Follow instructions from your health care provider. Do not do any strenuous activity for 12 hours after the procedure or for as long as told by your health care provider. This includes lifting heavy items, working out, yoga, or doing activities that increase your heart rate. Do not get laser treatments, facials, or facial massages for 1-2 weeks after the procedure or for as long as told by your health care provider. Keep all follow-up visits as told by your health care provider. This is important. Contact a health care provider if: You have a headache that gets worse. You have a fever. Get help right away if you: You develop signs of an allergic reaction. These include: An itchy rash or welts. Wheezing or shortness of breath. Dizziness. Your eyelids become droopy or swollen. You have trouble speaking. You feel short of breath or have trouble breathing. You have trouble swallowing. Summary Botulinum toxin injection is a procedure to soften lines and wrinkles in the face. This is a safe procedure. However, some problems may occur, including infection, drooping eyelid, double or blurred vision, and trouble swallowing. Follow your heath care provider's instructions before the procedure. These may include stopping or changing medicines or stopping alcohol use. You will be told how to care for yourself after the procedure. Get help right away if you have chest pain, fever, or an allergic  reaction. Also, get help right away if you have problems with vision, trouble speaking, or you feel short of breath, become hoarse, or have trouble swallowing. This information is not intended to replace advice given to you by your health care provider. Make sure you discuss any questions you have with your health care provider. Document Revised: 01/22/2019 Document Reviewed: 10/24/2017 Elsevier Patient Education  2022 [employer].

## 2021-02-14 NOTE — Progress Notes (Signed)
Safety precautions to be maintained throughout the outpatient stay will include: orient to surroundings, keep bed in low position, maintain call bell within reach at all times, provide assistance with transfer out of bed and ambulation.  

## 2021-02-14 NOTE — Progress Notes (Signed)
PROVIDER NOTE: Information contained herein reflects review and annotations entered in association with encounter. Interpretation of such information and data should be left to medically-trained personnel. Information provided to patient can be located elsewhere in the medical record under "Patient Instructions". Document created using STT-dictation technology, any transcriptional errors that may result from process are unintentional.    Patient: Lindsay Austin  Service Category: Procedure  Provider: [name], MD  DOB: 10/29/1960  DOS: 02/14/2021  Location: [facility]  MRN: [id number]  Setting: Ambulatory - outpatient  Referring Provider: [name], MD  Type: Established Patient  Specialty: Interventional Pain Management  PCP: [name], [place]   Primary Reason for Visit: Interventional Pain Management Treatment. CC: Migraine    Procedure:          Anesthesia, Analgesia, Anxiolysis:  Botox for migraine management                 N/A   1. Chronic migraine without aura without status migrainosus, not intractable    Pain Score: Pre-procedure: 1 /10 Post-procedure: 1 /10     Pre-op H&P Assessment:  Ms. Gonsalves is a 60 y.o. (year old), female patient, seen today for interventional treatment. 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 Ms. Reining has a current medication list which includes the following prescription(s): albuterol, bupropion, clopidogrel, dicyclomine, fentanyl, [START ON 02/17/2021] fentanyl, [START ON 03/19/2021] fentanyl, emgality, methocarbamol, multiple  minerals-vitamins, naloxone, ondansetron, pregabalin, rizatriptan, sucralfate, trazodone, triamcinolone cream, escitalopram, and olmesartan. Her primarily concern today is the Migraine  Initial Vital Signs:  Pulse/HCG Rate: 64  Temp: 97.6 F (36.4 C) Resp: 15 BP: 116/79 SpO2: 92 %  BMI: Estimated body mass index is 32.61 kg/m as calculated from the following:   Height as of this encounter: 5\' 4"  (1.626 m).   Weight as of this encounter: 190 lb (86.2 kg).  Risk Assessment: Allergies: Reviewed. She is allergic to methadone, other, and tape.  Allergy Precautions: None required Coagulopathies: Reviewed. None identified.  Blood-thinner therapy: None at this time Active Infection(s): Reviewed. None identified. Ms. Rena is afebrile  Site Confirmation: Ms. Kattner was asked to confirm the procedure and laterality before marking the site Procedure checklist: Completed Consent: Before the procedure and under the influence of no sedative(s), amnesic(s), or anxiolytics, the patient was informed of the treatment options, risks and possible complications. To fulfill our ethical and legal obligations, as recommended by the American Medical Association's Code of Ethics, I have informed the patient of my clinical impression; the nature and purpose of the treatment or procedure; the risks, benefits, and possible complications of the intervention; the alternatives, including doing nothing; the risk(s) and benefit(s) of the alternative treatment(s) or procedure(s); and the risk(s) and benefit(s) of doing nothing. The patient was provided information about the general risks and possible complications associated with the procedure. These may include, but are not limited to: failure to achieve desired goals, infection, bleeding, organ or nerve damage, allergic reactions, paralysis, and death. In addition, the patient was informed of those risks and complications associated to the procedure, such as failure to  decrease pain; infection; bleeding; organ or nerve damage with subsequent damage to sensory, motor, and/or autonomic systems, resulting in permanent pain, numbness, and/or weakness  of one or several areas of the body; allergic reactions; (i.e.: anaphylactic reaction); and/or death. Furthermore, the patient was informed of those risks and complications associated with the medications. These include, but are not limited to: allergic reactions (i.e.: anaphylactic or anaphylactoid reaction(s)); adrenal axis suppression; blood sugar elevation that in diabetics may result in ketoacidosis or comma; water retention that in patients with history of congestive heart failure may result in shortness of breath, pulmonary edema, and decompensation with resultant heart failure; weight gain; swelling or edema; medication-induced neural toxicity; particulate matter embolism and blood vessel occlusion with resultant organ, and/or nervous system infarction; and/or aseptic necrosis of one or more joints. Finally, the patient was informed that Medicine is not an exact science; therefore, there is also the possibility of unforeseen or unpredictable risks and/or possible complications that may result in a catastrophic outcome. The patient indicated having understood very clearly. We have given the patient no guarantees and we have made no promises. Enough time was given to the patient to ask questions, all of which were answered to the patient's satisfaction. Ms. Elsbury has indicated that she wanted to continue with the procedure. Attestation: I, the ordering provider, attest that I have discussed with the patient the benefits, risks, side-effects, alternatives, likelihood of achieving goals, and potential problems during recovery for the procedure that I have provided informed consent. Date  Time: 02/14/2021 12:35 PM  Pre-Procedure Preparation:  Monitoring: As per clinic protocol. Respiration, ETCO2, SpO2, BP, heart rate and rhythm  monitor placed and checked for adequate function Safety Precautions: Patient was assessed for positional comfort and pressure points before starting the procedure. Time-out: I initiated and conducted the "Time-out" before starting the procedure, as per protocol. The patient was asked to participate by confirming the accuracy of the "Time Out" information. Verification of the correct person, site, and procedure were performed and confirmed by me, the nursing staff, and the patient. "Time-out" conducted as per Joint Commission's Universal Protocol (UP.01.01.01). Time: 1328  Constitution and injection details as below:   1 x 200 unit vial of Botox brand onabotulinum toxin A was diluted with 4 mL preservative free saline, for a final concentration of 5 units/0.1 mL. Injection sites were prepared for injection with alcohol swab  Botox was injected as follows:  5 units left corrugator 5 units right corrugator 5 units procerus  5 units in 2 sites, right frontalis (10 units total) 5units in 2 sites, left frontalis (10 units total) 5 units in each of 4 sites right temporalis (20 units total) 5 units in each of 4 sites left temporalis (20 units total) 5 units in each of 3 sites right occipitalis (15 units total) 5 units in each of 3 sites left occipitalis (15 units total) 5 units in each of 2 sites right cervical paraspinal at approximate C3-4 level (10 units total) 5 units in each of 2 sites left cervical paraspinal muscles at approximate C3-4 level (10 units total) 5 units in each of 3 sites right trapezius (15 units total) 5 units in each of 3 sites left trapezius (15 units total)  45 units were wasted  A total of 155 units was administered by Dr Holley Raring        Post-operative Assessment:  Post-procedure Vital Signs:  Pulse/HCG Rate: 64  Temp: 97.6 F (36.4 C) Resp: 15 BP: 116/79 SpO2: 92 %  EBL: None  Complications: No immediate post-treatment complications observed by team, or  reported by patient.  Note: The patient tolerated the entire procedure  well. A repeat set of vitals were taken after the procedure and the patient was kept under observation following institutional policy, for this type of procedure. Post-procedural neurological assessment was performed, showing return to baseline, prior to discharge. The patient was provided with post-procedure discharge instructions, including a section on how to identify potential problems. Should any problems arise concerning this procedure, the patient was given instructions to immediately contact us, at any time, without hesitation. In any case, we plan to contact the patient by telephone for a follow-up status report regarding this interventional procedure.  Comments:  No additional relevant information.  Plan of Care   Chronic Opioid Analgesic:   fentanyl patch  50 mcg an hour; 120 MME.   Medications ordered for procedure: Meds ordered this encounter  Medications   botulinum toxin Type A (BOTOX) injection 155 Units   Medications administered: We administered botulinum toxin Type A.  See the medical record for exact dosing, route, and time of administration.  Follow-up plan:   Return in about 4 weeks (around 03/14/2021) for Post Procedure Evaluation, virtual.     Recent Visits Date Type Provider Dept  01/18/21 Office Visit [name], MD [facility]  Showing recent visits within past 90 days and meeting all other requirements Today's Visits Date Type Provider Dept  02/14/21 Procedure visit [name], MD [facility]  Showing today's visits and meeting all other requirements Future Appointments Date Type Provider Dept  03/15/21 Appointment [name], MD [facility]  04/17/21 Appointment [name], MD [facility]  Showing future appointments within next 90 days and meeting all other requirements Disposition: Discharge home  Discharge (Date  Time):  02/14/2021; 1340 hrs.   Primary Care Physician: [name], [place] Location: [facility] Note by: [name], MD Date: 02/14/2021; Time: 3:09 PM  Disclaimer:  Medicine is not an exact science. The only guarantee in medicine is that nothing is guaranteed. It is important to note that the decision to proceed with this intervention was based on the information collected from the patient. The Data and conclusions were drawn from the patient's questionnaire, the interview, and the physical examination. Because the information was provided in large part by the patient, it cannot be guaranteed that it has not been purposely or unconsciously manipulated. Every effort has been made to obtain as much relevant data as possible for this evaluation. It is important to note that the conclusions that lead to this procedure are derived in large part from the available data. Always take into account that the treatment will also be dependent on availability of resources and existing treatment guidelines, considered by other Pain Management Practitioners as being common knowledge and practice, at the time of the intervention. For Medico-Legal purposes, it is also important to point out that variation in procedural techniques and pharmacological choices are the acceptable norm. The indications, contraindications, technique, and results of the above procedure should only be interpreted and judged by a Board-Certified Interventional Pain Specialist with extensive familiarity and expertise in the same exact procedure and technique.

## 2021-02-15 ENCOUNTER — Telehealth: Payer: Self-pay | Admitting: *Deleted

## 2021-02-15 NOTE — Telephone Encounter (Signed)
Called for post procedure check. No answer. LVM. 

## 2021-02-22 ENCOUNTER — Telehealth: Payer: Self-pay | Admitting: Student in an Organized Health Care Education/Training Program

## 2021-02-22 NOTE — Telephone Encounter (Signed)
PA resent via [facility] [employer] with records.  Patient aware that it may take approx 23 hours to turn around.

## 2021-02-22 NOTE — Telephone Encounter (Signed)
Patient needs PA for her fentanyl patch. She is out x3 days now. Please let her know when she can pick up.

## 2021-02-27 ENCOUNTER — Telehealth: Payer: Self-pay | Admitting: Student in an Organized Health Care Education/Training Program

## 2021-02-27 NOTE — Telephone Encounter (Signed)
PA resent to [facility].  States it will be a 1 day turn around.

## 2021-02-27 NOTE — Telephone Encounter (Signed)
Called to let patient know that we have the incorrect information for her insurance.  That I have been given the correct insurance carrier and have resent PA for Fentanyl patches.  Asked if she would please bring new card.  Turn around time for PA should be 24 hours.  Please check with pharmacy tomorrow.

## 2021-02-27 NOTE — Telephone Encounter (Signed)
Patient called on 02-22-21 about PA for meds. Lori sent in [place]. Patient is asking about PA. She has no meds. Please check on this. She is in a lot of pain. Please let patient know status.

## 2021-03-15 ENCOUNTER — Encounter: Payer: Self-pay | Admitting: Student in an Organized Health Care Education/Training Program

## 2021-03-15 ENCOUNTER — Other Ambulatory Visit: Payer: Self-pay

## 2021-03-15 ENCOUNTER — Ambulatory Visit
Payer: Medicaid Other | Attending: Student in an Organized Health Care Education/Training Program | Admitting: Student in an Organized Health Care Education/Training Program

## 2021-03-15 DIAGNOSIS — G43709 Chronic migraine without aura, not intractable, without status migrainosus: Secondary | ICD-10-CM

## 2021-03-15 NOTE — Progress Notes (Signed)
I attempted to call the patient however no response.  -Dr Nahomy Limburg  

## 2021-03-28 ENCOUNTER — Ambulatory Visit
Payer: Medicaid Other | Attending: Student in an Organized Health Care Education/Training Program | Admitting: Student in an Organized Health Care Education/Training Program

## 2021-03-28 ENCOUNTER — Encounter: Payer: Self-pay | Admitting: Student in an Organized Health Care Education/Training Program

## 2021-03-28 ENCOUNTER — Other Ambulatory Visit: Payer: Self-pay

## 2021-03-28 DIAGNOSIS — G894 Chronic pain syndrome: Secondary | ICD-10-CM

## 2021-03-28 DIAGNOSIS — G43709 Chronic migraine without aura, not intractable, without status migrainosus: Secondary | ICD-10-CM | POA: Diagnosis not present

## 2021-03-28 NOTE — Progress Notes (Signed)
Patient: Lindsay Austin  Service Category: E/M  Provider: [name], MD  DOB: 07-01-1960  DOS: 03/28/2021  Location: Office  MRN: [id number]  Setting: Ambulatory outpatient  Referring Provider: [name], PA  Type: Established Patient  Specialty: Interventional Pain Management  PCP: [name], PA  Location: Remote location  Delivery: TeleHealth     Virtual Encounter - Pain Management PROVIDER NOTE: Information contained herein reflects review and annotations entered in association with encounter. Interpretation of such information and data should be left to medically-trained personnel. Information provided to patient can be located elsewhere in the medical record under "Patient Instructions". Document created using STT-dictation technology, any transcriptional errors that may result from process are unintentional.    Contact & Pharmacy Preferred: [phone] Home: [phone] (home) Mobile: [phone] (mobile) E-mail: [email or web address]  [name] [place] [address]Phone: [phone]: [phone]  Pre-screening  Lindsay Austin "in-person" vs "virtual" encounter. She indicated preferring virtual for this encounter.   Reason COVID-19*  Social distancing based on CDC and AMA recommendations.   I contacted Lindsay Austin 03/28/2021 via telephone.      I clearly identified myself as BGillis Santa MD. I verified that I was speaking with the correct person using two identifiers (Name: Lindsay Austin and date of birth: [birth year].  Consent I sought verbal advanced consent from Lindsay Austin virtual visit interactions. I informed Lindsay Austin possible security and privacy concerns, risks, and limitations associated with providing "not-in-person" medical evaluation and management services. I also informed [name], [name], fully or partially, [name]   Historic Elements   Ms. JKeriann Austin a 60y.o. year old, female patient evaluated today after our last contact on 03/15/2021. Lindsay Austin has a past medical history of Anxiety, Arthritis, [place], [place], DDD (degenerative disc disease), cervical, Depression, Failed back syndrome, Fibromyalgia, GERD (gastroesophageal reflux disease), Headache, Heart murmur, Hyperlipemia, Hypertension, IBS (irritable bowel syndrome), Kidney failure, Renal insufficiency, RSD (reflex sympathetic dystrophy), Seizure ([place], Stomach ulcer, and Trigeminal neuralgia. 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 Lindsay Austin a current medication list which includes the following prescription(s): albuterol, bupropion, clopidogrel, dicyclomine, fentanyl, emgality, methocarbamol, multiple minerals-vitamins, naloxone, ondansetron, pregabalin, rizatriptan, sucralfate, trazodone, triamcinolone cream, escitalopram, and olmesartan. She  reports that she has never smoked. She has never used smokeless tobacco. She reports that she does not drink alcohol and does not use drugs. Ms. KRiepeis allergic to methadone, other, and tape.   HPI  Today, she is being contacted for a post-procedure assessment.   Post-Procedure Evaluation  Procedure(s):   Botox for migraine management  Anxiolysis: Please see nurses note.  Effectiveness during initial  hour after procedure (Ultra-Short Term Relief): 40 %   Local anesthetic used: Long-acting (4-6 hours) Effectiveness: Defined as any analgesic benefit obtained secondary to the administration of local anesthetics. This carries significant diagnostic value as to the etiological location, or anatomical origin, of the pain.  Duration of benefit is expected to coincide with the duration of the local anesthetic used.  Effectiveness during initial 4-6 hours after procedure (Short-Term Relief): 40 %   Long-term benefit: Defined as any relief past the pharmacologic duration of the local anesthetics.  Effectiveness past the initial 6 hours after procedure (Long-Term Relief): 40 %   Benefits, current: Defined as benefit present at the time of this evaluation.   Analgesia:  40%    Pharmacotherapy Assessment   Analgesic:  fentanyl patch  50 mcg an hour; 120 MME.   Monitoring: [place] PMP: PDMP not reviewed this encounter.       Pharmacotherapy: No side-effects or adverse reactions reported. Compliance: No problems identified. Effectiveness: Clinically acceptable. Plan: Refer to "POC". UDS:  Summary  Date Value Ref Range Status  05/11/2020 Note  Final    Comment:    ==================================================================== ToxASSURE Select 13 (MW) ==================================================================== Test                             Result       Flag       Units  Drug Present and Declared for Prescription Verification   Fentanyl                       15           EXPECTED   ng/mg creat   Norfentanyl                    >424         EXPECTED   ng/mg creat    Source of fentanyl is a scheduled prescription medication, including    IV, patch, and transmucosal formulations. Norfentanyl is an expected    metabolite of fentanyl.  ==================================================================== Test                      Result    Flag   Units      Ref Range   Creatinine               236              mg/dL      >=20 ==================================================================== Declared Medications:  The flagging and interpretation on this report are based on the  following declared medications.  Unexpected results may arise from  inaccuracies in the declared medications.   **Note: The testing scope of this panel includes these medications:   Fentanyl   **Note: The testing scope of this panel does not include the  following reported medications:   Acetaminophen  Albuterol  Bupropion (Wellbutrin SR)  Dicyclomine  Escitalopram  Galcanezumab (Emgality)  Methocarbamol  Multivitamin  Naloxone  Olmesartan (Benicar)  Ondansetron  Pregabalin  Rizatriptan  Sucralfate  Topical  Trazodone ==================================================================== For clinical consultation, please call [phone]. ====================================================================      Laboratory Chemistry Profile   Renal Lab Results  Component Value Date   BUN 13 12/29/2020   CREATININE 1.44 (H) 12/29/2020   LABCREA 71 08/29/2019   GFRAA 51 (L) 08/31/2019   GFRNONAA 42 (L) 12/29/2020    Hepatic Lab Results  Component Value Date   AST 32 08/29/2019   ALT 12 08/29/2019   ALBUMIN 4.1 08/29/2019   ALKPHOS 66 08/29/2019    Electrolytes Lab Results  Component Value Date   NA 139 12/29/2020   K 4.3 12/29/2020   CL 101 12/29/2020  CALCIUM 9.2 12/29/2020   MG 2.2 08/31/2019    Bone No results found for: VD25OH, VD125OH2TOT, UX8333OV2, NV9166MA0, 25OHVITD1, 25OHVITD2, 25OHVITD3, TESTOFREE, TESTOSTERONE  Inflammation (CRP: Acute Phase) (ESR: Chronic Phase) No results found for: CRP, ESRSEDRATE, LATICACIDVEN       Note: Above Lab results reviewed.  Imaging  MR BRAIN WO CONTRAST CLINICAL DATA:  Headache and weakness in the left hand. Unsteady gait.  EXAM: MRI HEAD WITHOUT CONTRAST  TECHNIQUE: Multiplanar, multiecho pulse  sequences of the brain and surrounding structures were obtained without intravenous contrast.  COMPARISON:  12/29/2020  FINDINGS: Brain: No acute infarction, hemorrhage, hydrocephalus, extra-axial collection or mass lesion. Small FLAIR hyperintensities in the cerebral white matter usually related to chronic small vessel ischemia and commonly encountered by patient age. Dilated perivascular spaces at the bilateral globus pallidus region. Brain volume is normal.  Vascular: Normal flow voids.  Skull and upper cervical spine: Normal marrow signal.  Sinuses/Orbits: Negative.  IMPRESSION: 1. No acute finding or explanation for symptoms. 2. Mild, nonspecific white matter disease, likely early chronic small vessel ischemia.  Electronically Signed   By: Jorje Guild M.D.   On: 02/10/2021 11:17  Assessment  The primary encounter diagnosis was Chronic migraine without aura without status migrainosus, not intractable. A diagnosis of Chronic pain syndrome was also pertinent to this visit.  Plan of Care   Overall, positive response to Botox therapy.  Bilan notes a reduction in her migraine frequency and intensity.  She would like to repeat injections again.  We can repeat anytime after May 26, 2021 which would be 3 months after her previous treatment. Keep appointment for medication management next month as scheduled.  Orders:  Orders Placed This Encounter  Procedures   Chemo Denervation    Standing Status:   Future    Standing Expiration Date:   03/28/2022    Scheduling Instructions:     Botox #2 for migraine management after 05/18/21    Follow-up plan:   Return in about 8 weeks (around 05/21/2021) for Botox #2 .    Recent Visits Date Type Provider Dept  02/14/21 Procedure visit [name], MD [facility]  01/18/21 Office Visit [name], MD [facility]  Showing recent visits within past 90 days and meeting all other requirements Today's  Visits Date Type Provider Dept  03/28/21 Office Visit [name], MD [facility]  Showing today's visits and meeting all other requirements Future Appointments Date Type Provider Dept  04/17/21 Appointment [name], MD [facility]  Showing future appointments within next 90 days and meeting all other requirements I discussed the assessment and treatment plan with the patient. The patient was provided an opportunity to ask questions and all were answered. The patient agreed with the plan and demonstrated an understanding of the instructions.  Patient advised to call back or seek an in-person evaluation if the symptoms or condition worsens.  Duration of encounter: 25mnutes.  Note by: BGillis Santa MD Date: 03/28/2021; Time: 3:04 PM

## 2021-04-17 ENCOUNTER — Encounter: Payer: Self-pay | Admitting: Student in an Organized Health Care Education/Training Program

## 2021-04-17 ENCOUNTER — Other Ambulatory Visit: Payer: Self-pay

## 2021-04-17 ENCOUNTER — Ambulatory Visit
Payer: Medicaid Other | Attending: Student in an Organized Health Care Education/Training Program | Admitting: Student in an Organized Health Care Education/Training Program

## 2021-04-17 VITALS — BP 125/74 | HR 64 | Temp 97.1°F | Resp 14 | Ht 64.0 in | Wt 190.0 lb

## 2021-04-17 DIAGNOSIS — Z981 Arthrodesis status: Secondary | ICD-10-CM | POA: Insufficient documentation

## 2021-04-17 DIAGNOSIS — G9050 Complex regional pain syndrome I, unspecified: Secondary | ICD-10-CM | POA: Insufficient documentation

## 2021-04-17 DIAGNOSIS — S7001XS Contusion of right hip, sequela: Secondary | ICD-10-CM | POA: Diagnosis present

## 2021-04-17 DIAGNOSIS — Q761 Klippel-Feil syndrome: Secondary | ICD-10-CM | POA: Diagnosis present

## 2021-04-17 DIAGNOSIS — G5701 Lesion of sciatic nerve, right lower limb: Secondary | ICD-10-CM | POA: Diagnosis present

## 2021-04-17 DIAGNOSIS — T85192S Other mechanical complication of implanted electronic neurostimulator (electrode) of spinal cord, sequela: Secondary | ICD-10-CM | POA: Diagnosis present

## 2021-04-17 DIAGNOSIS — M47816 Spondylosis without myelopathy or radiculopathy, lumbar region: Secondary | ICD-10-CM | POA: Insufficient documentation

## 2021-04-17 DIAGNOSIS — M47812 Spondylosis without myelopathy or radiculopathy, cervical region: Secondary | ICD-10-CM | POA: Insufficient documentation

## 2021-04-17 DIAGNOSIS — M4802 Spinal stenosis, cervical region: Secondary | ICD-10-CM | POA: Insufficient documentation

## 2021-04-17 DIAGNOSIS — G43709 Chronic migraine without aura, not intractable, without status migrainosus: Secondary | ICD-10-CM | POA: Diagnosis not present

## 2021-04-17 DIAGNOSIS — M533 Sacrococcygeal disorders, not elsewhere classified: Secondary | ICD-10-CM | POA: Insufficient documentation

## 2021-04-17 DIAGNOSIS — G894 Chronic pain syndrome: Secondary | ICD-10-CM | POA: Insufficient documentation

## 2021-04-17 MED ORDER — FENTANYL 50 MCG/HR TD PT72: 1.0000 | MEDICATED_PATCH | TRANSDERMAL | 0 refills | Status: AC

## 2021-04-17 MED ORDER — FENTANYL 50 MCG/HR TD PT72: 1.0000 | MEDICATED_PATCH | TRANSDERMAL | 0 refills | Status: DC

## 2021-04-17 NOTE — Patient Instructions (Signed)

## 2021-04-17 NOTE — Progress Notes (Signed)
PROVIDER NOTE: Information contained herein reflects review and annotations entered in association with encounter. Interpretation of such information and data should be left to medically-trained personnel. Information provided to patient can be located elsewhere in the medical record under "Patient Instructions". Document created using STT-dictation technology, any transcriptional errors that may result from process are unintentional.    Patient: Lindsay Austin  Service Category: E/M  Provider: [name], MD  DOB: 07-14-1960  DOS: 04/17/2021  Specialty: Interventional Pain Management  MRN: [id number]  Setting: Ambulatory outpatient  PCP: [name], PA  Type: Established Patient    Referring Provider: [name], PA  Location: Office  Delivery: Face-to-face     HPI  [name], a 60 y.o. year old female, is here today because of her Chronic migraine without aura without status migrainosus, not intractable [G43.709]. [name] primary complain today is Back Pain (lower) and Neck Pain  Last encounter: My last encounter with her was on 03/28/21  Pertinent problems: [name] has Hx of lumbosacral spine surgery; History of lumbar fusion (L5-S1 fusion); Failed spinal cord stimulator, sequela; Cervical fusion syndrome (C4-C7); Chronic pain syndrome; Major depressive disorder with current active episode; Lumbar spondylosis; Cervical spondylosis without myelopathy; and Spinal stenosis in cervical region on their pertinent problem list. Pain Assessment: Severity of Chronic pain is reported as a 5 /10. Location: Back Lower/right buttock and upper leg. Onset: More than a month ago. Quality: Other (Comment), Burning, Sharp (maddening). Timing: Constant. Modifying factor(s): pillow support, medications. Vitals:  height is '5\' 4"'  (1.626 m) and weight is 190 lb (86.2 kg). Her temporal temperature is 97.1 F (36.2 C) (abnormal). Her blood pressure is 125/74 and her pulse is 64. Her respiration is 14 and  oxygen saturation is 96%.   Reason for encounter: medication management.    Patient presents today for medication management.  No significant change in medical history.  Continues Plavix for TIA.  Has an upcoming appointment for dental extraction.  Is endorsing bilateral buttock pain with radiation into lateral thigh as well as piriformis pain.  She is status post right SI joint junction 07/19/2020 that provided 50% pain relief for approximately 3 to 4 months with gradual return of pain thereafter.  She is now having pain bilaterally we discussed repeating bilateral sacroiliac joint and bilateral piriformis injection.  Risks and benefits reviewed and patient would like proceed.  Instructed to stop Plavix 5 days prior to scheduled procedure.  Okay to continue aspirin 81  Otherwise refill Sentinel patch as below.  No change in dose.  Renewed today urine toxicology screen.    Pharmacotherapy Assessment  Analgesic:  fentanyl patch  50 mcg an hour; 120 MME.   Monitoring: [place] PMP: PDMP reviewed during this encounter.       Pharmacotherapy: No side-effects or adverse reactions reported. Compliance: No problems identified. Effectiveness: Clinically acceptable.  UDS:  Summary  Date Value Ref Range Status  05/11/2020 Note  Final    Comment:    ==================================================================== ToxASSURE Select 13 (MW) ==================================================================== Test                             Result       Flag       Units  Drug Present and Declared for Prescription Verification   Fentanyl                       15  EXPECTED   ng/mg creat   Norfentanyl                    >424         EXPECTED   ng/mg creat    Source of fentanyl is a scheduled prescription medication, including    IV, patch, and transmucosal formulations. Norfentanyl is an expected    metabolite of  fentanyl.  ==================================================================== Test                      Result    Flag   Units      Ref Range   Creatinine              236              mg/dL      >=20 ==================================================================== Declared Medications:  The flagging and interpretation on this report are based on the  following declared medications.  Unexpected results may arise from  inaccuracies in the declared medications.   **Note: The testing scope of this panel includes these medications:   Fentanyl   **Note: The testing scope of this panel does not include the  following reported medications:   Acetaminophen  Albuterol  Bupropion (Wellbutrin SR)  Dicyclomine  Escitalopram  Galcanezumab (Emgality)  Methocarbamol  Multivitamin  Naloxone  Olmesartan (Benicar)  Ondansetron  Pregabalin  Rizatriptan  Sucralfate  Topical  Trazodone ==================================================================== For clinical consultation, please call [phone]. ====================================================================       ROS  Constitutional: Denies any fever or chills Gastrointestinal: No reported hemesis, hematochezia, vomiting, or acute GI distress Musculoskeletal:  Low back, bilateral hip, right greater than left SI joint and groin pain Neurological: No reported episodes of acute onset apraxia, aphasia, dysarthria, agnosia, amnesia, paralysis, loss of coordination, or loss of consciousness  Medication Review  Galcanezumab-gnlm, Multiple Minerals-Vitamins, albuterol, buPROPion, clopidogrel, dicyclomine, escitalopram, fentaNYL, methocarbamol, naloxone, olmesartan, ondansetron, pregabalin, rizatriptan, sucralfate, traZODone, and triamcinolone cream  History Review  Allergy: [name] is allergic to methadone, other, and tape. Drug: [name]  reports no history of drug use. Alcohol:  reports no history of alcohol  use. Tobacco:  reports that she has never smoked. She has never used smokeless tobacco. Social: [name]  reports that she has never smoked. She has never used smokeless tobacco. She reports that she does not drink alcohol and does not use drugs. Medical:  has a past medical history of Anxiety, Arthritis, Bladder cancer ([place]), Cancer of kidney ([place]), DDD (degenerative disc disease), cervical, Depression, Failed back syndrome, Fibromyalgia, GERD (gastroesophageal reflux disease), Headache, Heart murmur, Hyperlipemia, Hypertension, IBS (irritable bowel syndrome), Kidney failure, Renal insufficiency, RSD (reflex sympathetic dystrophy), Seizure ([place]), Stomach ulcer, and Trigeminal neuralgia. Surgical: [name]  has a past surgical history that includes Transurethral resection of bladder tumor with gyrus (turbt-gyrus) (2012, 2013); Nephroureterectomy (2011); Back surgery (1995); Cholecystectomy; Brain surgery (2004); trigeminal neuralgia (2004); Spinal cord stimulator implant (2004); colonscopy (2006); Posterior cervical fusion/foraminotomy (Bilateral, 07/05/2019); Posterior cervical laminectomy (Bilateral, 07/05/2019); and Breast biopsy (Left, 2017). Family: family history includes Dementia in her father; Diabetes in her mother; Heart disease in her mother; Kidney disease in her father.  Laboratory Chemistry Profile   Renal Lab Results  Component Value Date   BUN 13 12/29/2020   CREATININE 1.44 (H) 12/29/2020   LABCREA 71 08/29/2019   GFRAA 51 (L) 08/31/2019   GFRNONAA 42 (L) 12/29/2020     Hepatic Lab Results  Component Value Date  AST 32 08/29/2019   ALT 12 08/29/2019   ALBUMIN 4.1 08/29/2019   ALKPHOS 66 08/29/2019     Electrolytes Lab Results  Component Value Date   NA 139 12/29/2020   K 4.3 12/29/2020   CL 101 12/29/2020   CALCIUM 9.2 12/29/2020   MG 2.2 08/31/2019     Bone No results found for: VD25OH, VD125OH2TOT, EU2353IR4, ER1540GQ6, 25OHVITD1, 25OHVITD2, 25OHVITD3, TESTOFREE,  TESTOSTERONE   Inflammation (CRP: Acute Phase) (ESR: Chronic Phase) No results found for: CRP, ESRSEDRATE, LATICACIDVEN     Note: Above Lab results reviewed.   Physical Exam  General appearance: Well nourished, well developed, and well hydrated. In no apparent acute distress Mental status: Alert, oriented x 3 (person, place, & time)       Respiratory: No evidence of acute respiratory distress Eyes: PERLA Vitals: BP 125/74    Pulse 64    Temp (!) 97.1 F (36.2 C) (Temporal)    Resp 14    Ht '5\' 4"'  (1.626 m)    Wt 190 lb (86.2 kg)    SpO2 96%    BMI 32.61 kg/m  BMI: Estimated body mass index is 32.61 kg/m as calculated from the following:   Height as of this encounter: '5\' 4"'  (1.626 m).   Weight as of this encounter: 190 lb (86.2 kg). Ideal: Ideal body weight: 54.7 kg (120 lb 9.5 oz) Adjusted ideal body weight: 67.3 kg (148 lb 5.7 oz)  Lumbar Spine Area Exam  Skin & Axial Inspection: Well healed scar from previous spine surgery detected Alignment: Symmetrical Functional ROM: Pain restricted ROM affecting primarily the right Stability: No instability detected Muscle Tone/Strength: Functionally intact. No obvious neuro-muscular anomalies detected. Sensory (Neurological): Musculoskeletal pain pattern Palpation: No palpable anomalies       Provocative Tests: Hyperextension/rotation test: deferred today       Lumbar quadrant test (Kemp's test): deferred today       Lateral bending test: (+) due to pain. Patrick's Maneuver: (+) for right-sided S-I arthralgia greater than left             FABER* test: (+) for right-sided S-I arthralgia greater than left        S-I anterior distraction/compression test: (+) Right-sided greater than left       S-I lateral compression test: (+)   S-I arthralgia/arthropathy greater than left  S-I Thigh-thrust test: (+)   S-I arthralgia/arthropathy greater than left  S-I Gaenslen's test: deferred today         *(Flexion, ABduction and External Rotation)    Gait & Posture Assessment  Ambulation: Patient came in today in a wheel chair Gait: Limited. Using assistive device to ambulate Posture: Difficulty standing up straight, due to pain    Lower Extremity Exam      Side: Right lower extremity   Side: Left lower extremity  Stability: No instability observed           Stability: No instability observed          Skin & Extremity Inspection: Skin color, temperature, and hair growth are WNL. No peripheral edema or cyanosis. No masses, redness, swelling, asymmetry, or associated skin lesions. No contractures.   Skin & Extremity Inspection: Skin color, temperature, and hair growth are WNL. No peripheral edema or cyanosis. No masses, redness, swelling, asymmetry, or associated skin lesions. No contractures.  Functional ROM: Pain restricted ROM for all joints of the lower extremity           Functional ROM: Pain restricted  ROM for all joints of the lower extremity          Muscle Tone/Strength: Functionally intact. No obvious neuro-muscular anomalies detected.   Muscle Tone/Strength: Functionally intact. No obvious neuro-muscular anomalies detected.  Sensory (Neurological): Unimpaired         Sensory (Neurological): Unimpaired        DTR: Patellar: deferred today Achilles: deferred today Plantar: deferred today   DTR: Patellar: deferred today Achilles: deferred today Plantar: deferred today  Palpation: No palpable anomalies   Palpation: No palpable anomalies    Assessment   Status Diagnosis  Stable Having a Flare-up Having a Flare-up 1. Chronic migraine without aura without status migrainosus, not intractable   2. Sacroiliac joint dysfunction of left side   3. Sacroiliac joint dysfunction of right side   4. Lumbar spondylosis   5. RSD (reflex sympathetic dystrophy)   6. Cervical fusion syndrome (C4-T2)   7. Piriformis syndrome of right side   8. Spinal stenosis in cervical region   9. Lumbar facet arthropathy   10. Chronic pain syndrome    11. History of lumbar fusion   12. Cervical spondylosis without myelopathy   13. Failed spinal cord stimulator, sequela   14. Contusion of right hip, sequela   15. Cervical facet joint syndrome       Plan of Care  [name] has a current medication list which includes the following long-term medication(s): albuterol, bupropion, dicyclomine, rizatriptan, sucralfate, trazodone, escitalopram, and olmesartan.  Pharmacotherapy (Medications Ordered): Meds ordered this encounter  Medications   fentaNYL (DURAGESIC) 50 MCG/HR    Sig: Place 1 patch onto the skin every 3 (three) days. Most last 30 days.    Dispense:  10 patch    Refill:  0    [place] STOP ACT - Not applicable. Fill one day early if pharmacy is closed on scheduled refill date.   fentaNYL (DURAGESIC) 50 MCG/HR    Sig: Place 1 patch onto the skin every 3 (three) days. Most last 30 days.    Dispense:  10 patch    Refill:  0    [place] STOP ACT - Not applicable. Fill one day early if pharmacy is closed on scheduled refill date.   fentaNYL (DURAGESIC) 50 MCG/HR    Sig: Place 1 patch onto the skin every 3 (three) days. Most last 30 days.    Dispense:  10 patch    Refill:  0    [place] STOP ACT - Not applicable. Fill one day early if pharmacy is closed on scheduled refill date.   Orders Placed This Encounter  Procedures   SACROILIAC JOINT INJECTION    Standing Status:   Future    Standing Expiration Date:   05/18/2021    Scheduling Instructions:     Side: Bilateral     Sedation: PO Valium     Timeframe: ASAP    Order Specific Question:   Where will this procedure be performed?    Answer:   [facility]   TRIGGER POINT INJECTION    Area: Buttocks region (gluteal area) Indications: Piriformis muscle pain; Bilateral (G57.03) piriformis-syndrome; piriformis muscle spasms (Q59.563). CPT code: 20552    Standing Status:   Future    Standing Expiration Date:   04/17/2022    Scheduling Instructions:     Bilateral piriformis     Order Specific Question:   Where will this procedure be performed?    Answer:   [facility]   ToxASSURE Select 13 (MW),  Urine    Volume: 30 ml(s). Minimum 3 ml of urine is needed. Document temperature of fresh sample. Indications: Long term (current) use of opiate analgesic (Z79.891)    Order Specific Question:   Release to patient    Answer:   Immediate     Continue Lyrica as prescribed: 75 mg TID   Follow-up plan:   Return in about 15 days (around 05/02/2021) for B/L SI-J and B/L Piriformis (ok to stop Plavix 5 days prior)  , minimal sedation (PO Valium).   Recent Visits Date Type Provider Dept  03/28/21 Office Visit [name], MD [facility]  02/14/21 Procedure visit [name], MD [facility]  01/18/21 Office Visit [name], MD [facility]  Showing recent visits within past 90 days and meeting all other requirements Today's Visits Date Type Provider Dept  04/17/21 Office Visit [name], MD [facility]  Showing today's visits and meeting all other requirements Future Appointments No visits were found meeting these conditions. Showing future appointments within next 90 days and meeting all other requirements  I discussed the assessment and treatment plan with the patient. The patient was provided an opportunity to ask questions and all were answered. The patient agreed with the plan and demonstrated an understanding of the instructions.  Patient advised to call back or seek an in-person evaluation if the symptoms or condition worsens.  Duration of encounter: 35 minutes.  Note by: [name], MD Date: 04/17/2021; Time: 11:21 AM

## 2021-04-17 NOTE — Progress Notes (Signed)
Nursing Pain Medication Assessment:  Safety precautions to be maintained throughout the outpatient stay will include: orient to surroundings, keep bed in low position, maintain call bell within reach at all times, provide assistance with transfer out of bed and ambulation.  Medication Inspection Compliance: Lindsay Austin did not comply with our request to bring her pills to be counted. She was reminded that bringing the medication bottles, even when empty, is a requirement.  Medication: None brought in. Pill/Patch Count: None available to be counted. Bottle Appearance: No container available. Did not bring bottle(s) to appointment. Filled Date: N/A Last Medication intake:   Last applied 3 days ago

## 2021-04-27 LAB — TOXASSURE SELECT 13 (MW), URINE

## 2021-05-02 ENCOUNTER — Ambulatory Visit: Payer: Medicaid Other | Admitting: Student in an Organized Health Care Education/Training Program

## 2021-05-07 ENCOUNTER — Ambulatory Visit: Payer: Medicaid Other | Admitting: Student in an Organized Health Care Education/Training Program

## 2021-05-10 ENCOUNTER — Telehealth: Payer: Self-pay

## 2021-05-10 NOTE — Telephone Encounter (Signed)
Back in November you ordered Botox for after 1/20. I have the authorization for that, do you want it scheduled yet? She has a  (B) SI and TPI coming up on Monday. Do you want to discuss with her and let me know?

## 2021-05-14 ENCOUNTER — Ambulatory Visit: Payer: Medicaid Other | Admitting: Student in an Organized Health Care Education/Training Program

## 2021-05-23 ENCOUNTER — Ambulatory Visit (INDEPENDENT_AMBULATORY_CARE_PROVIDER_SITE_OTHER): Payer: Medicaid Other | Admitting: Urology

## 2021-05-23 ENCOUNTER — Other Ambulatory Visit: Payer: Self-pay

## 2021-05-23 ENCOUNTER — Encounter: Payer: Self-pay | Admitting: Urology

## 2021-05-23 VITALS — BP 120/78 | HR 66 | Ht 64.0 in | Wt 190.0 lb

## 2021-05-23 DIAGNOSIS — R310 Gross hematuria: Secondary | ICD-10-CM | POA: Diagnosis not present

## 2021-05-23 DIAGNOSIS — C679 Malignant neoplasm of bladder, unspecified: Secondary | ICD-10-CM

## 2021-05-23 NOTE — Progress Notes (Signed)
Cystoscopy Procedure Note:   Indication: History of urothelial cell carcinoma   2011: Left nephroureterectomy for T1 high-grade urothelial cell carcinoma, negative margins ([facility]) 2012 and 2013: TURBT at [facility], path unavailable [facility]   After informed consent and discussion of the procedure and its risks, Lindsay Austin was positioned and prepped in the standard fashion. Cystoscopy was performed with a flexible cystoscope. The urethra, bladder neck and entire bladder was visualized in a standard fashion.The left ureteral orifice was surgically absent, right ureteral orifice orthotopic.  Mucosa grossly normal without any abnormalities.  Retroflexion normal.  Cytology sent   Imaging: CT dated 05/18/2019 with No evidence of disease recurrence.  Stable 1.2 cm midline omental lesion unchanged from prior and likely benign. Renal US 05/2020 normal right kidney   Findings: Normal cystoscopy   Plan: We reviewed the AUA guidelines regarding follow-up for urothelial cell carcinoma, and that routine follow-up after 10 years is based on shared decision making.  We reviewed considering cystoscopy and renal ultrasounds every 1 to 2 years, versus follow-up as needed.  We discussed the risk of developing recurrence of disease and progression at a further stage by deferring surveillance.  Using shared decision making she would like to discontinue ongoing cystoscopy and imaging, she knows to contact us immediately for any gross hematuria or flank pain.  -Normal cystoscopy today -We will call with renal ultrasound results -If renal ultrasound normal, she opts to defer further surveillance cystoscopy and imaging  [name], MD 05/23/2021

## 2021-05-23 NOTE — Addendum Note (Signed)
Addended by: Despina Hidden on: 05/23/2021 02:54 PM   Modules accepted: Orders

## 2021-05-25 LAB — CYTOLOGY - NON PAP

## 2021-06-04 ENCOUNTER — Telehealth: Payer: Self-pay | Admitting: Student in an Organized Health Care Education/Training Program

## 2021-06-04 NOTE — Telephone Encounter (Signed)
Patient returned nurse call about insurance  It is [facility] ID# [id number] This is regarding a PA for her meds

## 2021-06-04 NOTE — Telephone Encounter (Signed)
PA sent with records to [employer] [facility].

## 2021-06-11 ENCOUNTER — Ambulatory Visit: Payer: Medicaid Other

## 2021-06-12 ENCOUNTER — Other Ambulatory Visit: Payer: Self-pay

## 2021-06-12 ENCOUNTER — Ambulatory Visit
Admission: RE | Admit: 2021-06-12 | Discharge: 2021-06-12 | Disposition: A | Discharge status: Home / Self Care | Payer: Medicaid Other | Source: Ambulatory Visit | Attending: Urology | Admitting: Urology

## 2021-06-12 DIAGNOSIS — C679 Malignant neoplasm of bladder, unspecified: Secondary | ICD-10-CM | POA: Insufficient documentation

## 2021-07-04 ENCOUNTER — Other Ambulatory Visit: Payer: Self-pay

## 2021-07-04 ENCOUNTER — Encounter: Payer: Self-pay | Admitting: Student in an Organized Health Care Education/Training Program

## 2021-07-04 ENCOUNTER — Ambulatory Visit (HOSPITAL_BASED_OUTPATIENT_CLINIC_OR_DEPARTMENT_OTHER): Payer: Medicaid Other | Admitting: Student in an Organized Health Care Education/Training Program

## 2021-07-04 ENCOUNTER — Ambulatory Visit
Admission: RE | Admit: 2021-07-04 | Discharge: 2021-07-04 | Disposition: A | Discharge status: Home / Self Care | Payer: Medicaid Other | Source: Ambulatory Visit | Attending: Student in an Organized Health Care Education/Training Program | Admitting: Student in an Organized Health Care Education/Training Program

## 2021-07-04 VITALS — BP 121/76 | HR 59 | Temp 97.2°F | Resp 15 | Ht 64.0 in | Wt 193.0 lb

## 2021-07-04 DIAGNOSIS — G43709 Chronic migraine without aura, not intractable, without status migrainosus: Secondary | ICD-10-CM | POA: Insufficient documentation

## 2021-07-04 DIAGNOSIS — G5701 Lesion of sciatic nerve, right lower limb: Secondary | ICD-10-CM | POA: Insufficient documentation

## 2021-07-04 DIAGNOSIS — M461 Sacroiliitis, not elsewhere classified: Secondary | ICD-10-CM | POA: Insufficient documentation

## 2021-07-04 DIAGNOSIS — G894 Chronic pain syndrome: Secondary | ICD-10-CM | POA: Diagnosis not present

## 2021-07-04 DIAGNOSIS — G5703 Lesion of sciatic nerve, bilateral lower limbs: Secondary | ICD-10-CM

## 2021-07-04 DIAGNOSIS — M545 Low back pain, unspecified: Secondary | ICD-10-CM | POA: Insufficient documentation

## 2021-07-04 DIAGNOSIS — M533 Sacrococcygeal disorders, not elsewhere classified: Secondary | ICD-10-CM

## 2021-07-04 MED ORDER — LIDOCAINE HCL 2 % IJ SOLN
INTRAMUSCULAR | Status: AC
  Filled 2021-07-04: qty 20

## 2021-07-04 MED ORDER — DEXAMETHASONE SODIUM PHOSPHATE 10 MG/ML IJ SOLN
10.0000 mg | Freq: Once | INTRAMUSCULAR | Status: AC
  Administered 2021-07-04: 10 mg

## 2021-07-04 MED ORDER — IOHEXOL 180 MG/ML  SOLN
10.0000 mL | Freq: Once | INTRAMUSCULAR | Status: AC
  Administered 2021-07-04: 5 mL via INTRA_ARTICULAR

## 2021-07-04 MED ORDER — METHYLPREDNISOLONE ACETATE 80 MG/ML IJ SUSP
INTRAMUSCULAR | Status: AC
  Filled 2021-07-04: qty 1

## 2021-07-04 MED ORDER — ROPIVACAINE HCL 2 MG/ML IJ SOLN
9.0000 mL | Freq: Once | INTRAMUSCULAR | Status: AC
  Administered 2021-07-04: 9 mL via INTRA_ARTICULAR

## 2021-07-04 MED ORDER — METHYLPREDNISOLONE ACETATE 80 MG/ML IJ SUSP
80.0000 mg | Freq: Once | INTRAMUSCULAR | Status: AC
  Administered 2021-07-04: 80 mg via INTRA_ARTICULAR

## 2021-07-04 MED ORDER — DEXAMETHASONE SODIUM PHOSPHATE 10 MG/ML IJ SOLN
INTRAMUSCULAR | Status: AC
  Filled 2021-07-04: qty 2

## 2021-07-04 MED ORDER — DIAZEPAM 5 MG PO TABS
ORAL_TABLET | ORAL | Status: AC
  Filled 2021-07-04: qty 1

## 2021-07-04 MED ORDER — ROPIVACAINE HCL 2 MG/ML IJ SOLN
INTRAMUSCULAR | Status: AC
  Filled 2021-07-04: qty 20

## 2021-07-04 MED ORDER — LIDOCAINE HCL 2 % IJ SOLN
20.0000 mL | Freq: Once | INTRAMUSCULAR | Status: AC
  Administered 2021-07-04: 400 mg

## 2021-07-04 NOTE — Patient Instructions (Signed)

## 2021-07-04 NOTE — Progress Notes (Signed)
PROVIDER NOTE: Interpretation of information contained herein should be left to medically-trained personnel. Specific patient instructions are provided elsewhere under "Patient Instructions" section of medical record. This document was created in part using STT-dictation technology, any transcriptional errors that may result from this process are unintentional.  Patient: Lindsay Austin Type: Established DOB: 13-May-1960 MRN: [id number] PCP: [name], [place]  Service: Procedure DOS: 07/04/2021 Setting: Ambulatory Location: Ambulatory outpatient facility Delivery: Face-to-face Provider: [name], MD Specialty: Interventional Pain Management Specialty designation: 09 Location: Outpatient facility Ref. Prov.: [name], [place]    Primary Reason for Visit: Interventional Pain Management Treatment. CC: Back Pain (Lower, right)    Procedure:          Anesthesia, Analgesia, Anxiolysis:  Type: Diagnostic Sacroiliac Joint Steroid Injection  & Bilateral Piriformis TPI   Region: Inferior Lumbosacral Region Level: PIIS (Posterior Inferior Iliac Spine) Laterality: Bilateral  Anesthesia: Local (1-2% Lidocaine)  Anxiolysis: Oral Valium 5 mg PO  Sedation: None  Guidance: Fluoroscopy           Position: Prone           1. Sacroiliac joint dysfunction of left side   2. Sacroiliac joint dysfunction of right side   3. Piriformis syndrome of right side   4. Chronic migraine without aura without status migrainosus, not intractable   5. Chronic pain syndrome   6. Piriformis syndrome of both sides    NAS-11 Pain score:   Pre-procedure: 4 /10   Post-procedure: 3  (standing/moving)/10     Pre-op H&P Assessment:  Lindsay Austin is a 61 y.o. (year old), female patient, seen today for interventional treatment. 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 Lindsay Austin has a current medication list which includes the following prescription(s): albuterol, bupropion, clopidogrel, dicyclomine, fentanyl, gabapentin, methocarbamol, naloxone, ondansetron, pregabalin, rizatriptan, sucralfate, trazodone, emgality, multiple minerals-vitamins, and triamcinolone cream. Her primarily concern today is the Back Pain (Lower, right)  Initial Vital Signs:  Pulse/HCG Rate:  (!) 59   Temp: (!) 97.2 F (36.2 C) Resp: 16 BP: 115/81 SpO2: 100 %  BMI: Estimated body mass index is 33.13 kg/m as calculated from the following:   Height as of this encounter: '5\' 4"'$  (1.626 m).   Weight as of this encounter: 193 lb (87.5 kg).  Risk Assessment: Allergies: Reviewed. She is allergic to methadone, other, and tape.  Allergy Precautions: None required Coagulopathies: Reviewed. None identified.  Blood-thinner therapy: None at this time Active Infection(s): Reviewed. None identified. Lindsay Austin is afebrile  Site Confirmation: Lindsay Austin was asked to confirm the procedure and laterality before marking the site Procedure checklist: Completed Consent: Before the procedure and under the influence of no sedative(s), amnesic(s), or anxiolytics, the patient was informed of the treatment options, risks and possible complications. To fulfill our ethical and legal obligations, as recommended by the American Medical Association's Code of Ethics, I have informed the patient of my clinical impression; the nature and purpose of the treatment or procedure; the risks, benefits, and possible complications of the intervention; the alternatives, including doing nothing; the risk(s) and benefit(s) of the alternative treatment(s) or procedure(s); and the risk(s)  and benefit(s) of doing nothing. The patient was provided information about the general risks and possible complications  associated with the procedure. These may include, but are not limited to: failure to achieve desired goals, infection, bleeding, organ or nerve damage, allergic reactions, paralysis, and death. In addition, the patient was informed of those risks and complications associated to the procedure, such as failure to decrease pain; infection; bleeding; organ or nerve damage with subsequent damage to sensory, motor, and/or autonomic systems, resulting in permanent pain, numbness, and/or weakness of one or several areas of the body; allergic reactions; (i.e.: anaphylactic reaction); and/or death. Furthermore, the patient was informed of those risks and complications associated with the medications. These include, but are not limited to: allergic reactions (i.e.: anaphylactic or anaphylactoid reaction(s)); adrenal axis suppression; blood sugar elevation that in diabetics may result in ketoacidosis or comma; water retention that in patients with history of congestive heart failure may result in shortness of breath, pulmonary edema, and decompensation with resultant heart failure; weight gain; swelling or edema; medication-induced neural toxicity; particulate matter embolism and blood vessel occlusion with resultant organ, and/or nervous system infarction; and/or aseptic necrosis of one or more joints. Finally, the patient was informed that Medicine is not an exact science; therefore, there is also the possibility of unforeseen or unpredictable risks and/or possible complications that may result in a catastrophic outcome. The patient indicated having understood very clearly. We have given the patient no guarantees and we have made no promises. Enough time was given to the patient to ask questions, all of which were answered to the patient's satisfaction. Lindsay Austin has indicated that she wanted to continue  with the procedure. Attestation: I, the ordering provider, attest that I have discussed with the patient the benefits, risks, side-effects, alternatives, likelihood of achieving goals, and potential problems during recovery for the procedure that I have provided informed consent. Date   Time: 07/04/2021  9:55 AM  Pre-Procedure Preparation:  Monitoring: As per clinic protocol. Respiration, ETCO2, SpO2, BP, heart rate and rhythm monitor placed and checked for adequate function Safety Precautions: Patient was assessed for positional comfort and pressure points before starting the procedure. Time-out: I initiated and conducted the "Time-out" before starting the procedure, as per protocol. The patient was asked to participate by confirming the accuracy of the "Time Out" information. Verification of the correct person, site, and procedure were performed and confirmed by me, the nursing staff, and the patient. "Time-out" conducted as per Joint Commission's Universal Protocol (UP.01.01.01). Time: 1034  Description of Procedure:          Target Area: Inferior, posterior, aspect of the sacroiliac fissure Approach: Posterior, paraspinal, ipsilateral approach. Area Prepped: Entire Lower Lumbosacral Region DuraPrep (Iodine Povacrylex [0.7% available iodine] and Isopropyl Alcohol, 74% w/w) Safety Precautions: Aspiration looking for blood return was conducted prior to all injections. At no point did we inject any substances, as a needle was being advanced. No attempts were made at seeking any paresthesias. Safe injection practices and needle disposal techniques used. Medications properly checked for expiration dates. SDV (single dose vial) medications used. Description of the Procedure: Protocol guidelines were followed. The patient was placed in position over the procedure table. The target area was identified and the area prepped in the usual manner. Skin & deeper tissues infiltrated with local anesthetic.  Appropriate amount of time allowed to pass for local anesthetics to take effect. The procedure needle was advanced under fluoroscopic guidance into the sacroiliac joint until a firm endpoint was obtained. Proper needle placement secured. Negative aspiration confirmed. Solution injected in intermittent fashion, asking for systemic symptoms every 0.5cc  of injectate. The needles were then removed and the area cleansed, making sure to leave some of the prepping solution back to take advantage of its long term bactericidal properties. Vitals:   07/04/21 1001 07/04/21 1030 07/04/21 1040 07/04/21 1050  BP: 118/88 118/88 123/79 121/76  Pulse: 63 61 60 (!) 59  Resp: '15 15 13 15  '$ Temp:      TempSrc:      SpO2: 99% 100% 100% 100%  Weight:      Height:        Start Time: 1034 hrs. End Time: 1047 hrs. Materials:  Needle(s) Type: Spinal Needle Gauge: 25G Length: 3.5-in Medication(s): Please see orders for medications and dosing details.  10 cc solution made of 9 cc of 0.2% ropivacaine, 1 cc of methylprednisolone, 80 mg/cc.  5 cc injected into the left sacroiliac joint after contrast confirmation.  5 cc injected into the right sacroiliac joint after contrast confirmation.  Afterwards bilateral piriformis trigger point injections were done,  1 cm inferior, 1 cm deep, 1 cm lateral to the inferior fissure of the left and right SI joint.  Contrast was injected to confirm piriformis muscle striations.  8 cc solution made of 7 cc of 0.2% ropivacaine, 1 cc of Decadron 10 mg/cc.  4 cc injected into the left piriformis muscle, 4 cc injected into the right piriformis muscle.   Imaging Guidance (Non-Spinal):          Type of Imaging Technique: Fluoroscopy Guidance (Non-Spinal) Indication(s): Assistance in needle guidance and placement for procedures requiring needle placement in or near specific anatomical locations not easily accessible without such assistance. Exposure Time: Please see nurses notes. Contrast:  Before injecting any contrast, we confirmed that the patient did not have an allergy to iodine, shellfish, or radiological contrast. Once satisfactory needle placement was completed at the desired level, radiological contrast was injected. Contrast injected under live fluoroscopy. No contrast complications. See chart for type and volume of contrast used. Fluoroscopic Guidance: I was personally present during the use of fluoroscopy. "Tunnel Vision Technique" used to obtain the best possible view of the target area. Parallax error corrected before commencing the procedure. "Direction-depth-direction" technique used to introduce the needle under continuous pulsed fluoroscopy. Once target was reached, antero-posterior, oblique, and lateral fluoroscopic projection used confirm needle placement in all planes. Images permanently stored in EMR. Interpretation: I personally interpreted the imaging intraoperatively. Adequate needle placement confirmed in multiple planes. Appropriate spread of contrast into desired area was observed. No evidence of afferent or efferent intravascular uptake. Permanent images saved into the patient's record.   Post-operative Assessment:  Post-procedure Vital Signs:  Pulse/HCG Rate:  (!) 59 (sb)  Temp:  (!) 97.2 F (36.2 C) Resp: 15 BP: 121/76 SpO2: 100 %  EBL: None  Complications: No immediate post-treatment complications observed by team, or reported by patient.  Note: The patient tolerated the entire procedure well. A repeat set of vitals were taken after the procedure and the patient was kept under observation following institutional policy, for this type of procedure. Post-procedural neurological assessment was performed, showing return to baseline, prior to discharge. The patient was provided with post-procedure discharge instructions, including a section on how to identify potential problems. Should any problems arise concerning this procedure, the patient was given  instructions to immediately contact us, at any time, without hesitation. In any case, we plan to contact the patient by telephone for a follow-up status report regarding this interventional procedure.  Comments:  No additional relevant information.  Plan of Care  Orders:  Orders Placed This Encounter  Procedures   DG PAIN CLIN[facility] C-ARM 1-60 MIN NO REPORT    Intraoperative interpretation by procedural physician at [place].    Standing Status:   Standing    Number of Occurrences:   1    Order Specific Question:   Reason for exam:    Answer:   Assistance in needle guidance and placement for procedures requiring needle placement in or near specific anatomical locations not easily accessible without such assistance.   Chemo Denervation    Standing Status:   Future    Standing Expiration Date:   07/05/2022    Scheduling Instructions:     Botox for migraine management, previously done in October    Order Specific Question:   Where should this test be performed?    Answer:   other   Chronic Opioid Analgesic:  fentanyl patch  50 mcg an hour; 120 MME.   Medications ordered for procedure: Meds ordered this encounter  Medications   iohexol (OMNIPAQUE) 180 MG/ML injection 10 mL    Must be Myelogram-compatible. If not available, you may substitute with a water-soluble, non-ionic, hypoallergenic, myelogram-compatible radiological contrast medium.   lidocaine (XYLOCAINE) 2 % (with pres) injection 400 mg   ropivacaine (PF) 2 mg/mL (0.2%) (NAROPIN) injection 9 mL   methylPREDNISolone acetate (DEPO-MEDROL) injection 80 mg   ropivacaine (PF) 2 mg/mL (0.2%) (NAROPIN) injection 9 mL   dexamethasone (DECADRON) injection 10 mg   Medications administered: We administered iohexol, lidocaine, ropivacaine (PF) 2 mg/mL (0.2%), methylPREDNISolone acetate, ropivacaine (PF) 2 mg/mL (0.2%), and dexamethasone.  See the medical record for exact dosing, route, and time of administration.  Follow-up  plan:   Return for Keep sch. appt.      Recent Visits Date Type Provider Dept  04/17/21 Office Visit [name], MD [facility]  Showing recent visits within past 90 days and meeting all other requirements Today's Visits Date Type Provider Dept  07/04/21 Procedure visit [name], MD [facility]  Showing today's visits and meeting all other requirements Future Appointments Date Type Provider Dept  07/19/21 Appointment [name], MD [facility]  Showing future appointments within next 90 days and meeting all other requirements  Disposition: Discharge home  Discharge (Date   Time): 07/04/2021; 1052 hrs.   Primary Care Physician: [name], [place] Location: [facility] Note by: [name], MD Date: 07/04/2021; Time: 11:21 AM  Disclaimer:  Medicine is not an exact science. The only guarantee in medicine is that nothing is guaranteed. It is important to note that the decision to proceed with this intervention was based on the information collected from the patient. The Data and conclusions were drawn from the patient's questionnaire, the interview, and the physical examination. Because the information was provided in large part by the patient, it cannot be guaranteed that it has not been purposely or unconsciously manipulated. Every effort has been made to obtain as much relevant data as possible for this evaluation. It is important to note that the conclusions that lead to this procedure are derived in large part from the available data. Always take into account that the treatment will also be dependent on availability of resources and existing treatment guidelines, considered by other Pain Management Practitioners as being common knowledge and practice, at the time of the intervention. For Medico-Legal purposes, it is also important to point out that variation in procedural techniques and pharmacological choices are  the  acceptable norm. The indications, contraindications, technique, and results of the above procedure should only be interpreted and judged by a Board-Certified Interventional Pain Specialist with extensive familiarity and expertise in the same exact procedure and technique.

## 2021-07-05 ENCOUNTER — Telehealth: Payer: Self-pay | Admitting: *Deleted

## 2021-07-05 NOTE — Telephone Encounter (Signed)
Post procedure call; patient states that she has a weird feeling in her left hip before she got home yesterday from procedure.  Reports that it is much better this morning,  did not last long before it began to ease off.  Today it is really good.  ?

## 2021-07-18 ENCOUNTER — Ambulatory Visit
Payer: Medicaid Other | Attending: Student in an Organized Health Care Education/Training Program | Admitting: Student in an Organized Health Care Education/Training Program

## 2021-07-18 ENCOUNTER — Other Ambulatory Visit: Payer: Self-pay

## 2021-07-18 ENCOUNTER — Encounter: Payer: Self-pay | Admitting: Student in an Organized Health Care Education/Training Program

## 2021-07-18 VITALS — BP 138/84 | HR 70 | Temp 97.7°F | Resp 16 | Ht 64.0 in | Wt 179.0 lb

## 2021-07-18 DIAGNOSIS — S7001XS Contusion of right hip, sequela: Secondary | ICD-10-CM

## 2021-07-18 DIAGNOSIS — G894 Chronic pain syndrome: Secondary | ICD-10-CM | POA: Diagnosis not present

## 2021-07-18 DIAGNOSIS — Q761 Klippel-Feil syndrome: Secondary | ICD-10-CM

## 2021-07-18 DIAGNOSIS — M533 Sacrococcygeal disorders, not elsewhere classified: Secondary | ICD-10-CM

## 2021-07-18 DIAGNOSIS — M47816 Spondylosis without myelopathy or radiculopathy, lumbar region: Secondary | ICD-10-CM | POA: Diagnosis not present

## 2021-07-18 DIAGNOSIS — M4802 Spinal stenosis, cervical region: Secondary | ICD-10-CM

## 2021-07-18 DIAGNOSIS — G43709 Chronic migraine without aura, not intractable, without status migrainosus: Secondary | ICD-10-CM | POA: Diagnosis not present

## 2021-07-18 DIAGNOSIS — G9050 Complex regional pain syndrome I, unspecified: Secondary | ICD-10-CM

## 2021-07-18 DIAGNOSIS — M47812 Spondylosis without myelopathy or radiculopathy, cervical region: Secondary | ICD-10-CM

## 2021-07-18 DIAGNOSIS — T85192S Other mechanical complication of implanted electronic neurostimulator (electrode) of spinal cord, sequela: Secondary | ICD-10-CM

## 2021-07-18 DIAGNOSIS — G5701 Lesion of sciatic nerve, right lower limb: Secondary | ICD-10-CM

## 2021-07-18 DIAGNOSIS — Z981 Arthrodesis status: Secondary | ICD-10-CM

## 2021-07-18 MED ORDER — FENTANYL 50 MCG/HR TD PT72: 1.0000 | MEDICATED_PATCH | TRANSDERMAL | 0 refills | Status: AC

## 2021-07-18 MED ORDER — FENTANYL 50 MCG/HR TD PT72: 1.0000 | MEDICATED_PATCH | TRANSDERMAL | 0 refills | Status: DC

## 2021-07-18 NOTE — Progress Notes (Signed)
PROVIDER NOTE: Information contained herein reflects review and annotations entered in association with encounter. Interpretation of such information and data should be left to medically-trained personnel. Information provided to patient can be located elsewhere in the medical record under "Patient Instructions". Document created using STT-dictation technology, any transcriptional errors that may result from process are unintentional.  ?  ?Patient: Lindsay Austin  Service Category: E/M  Provider: [name], MD  ?DOB: [birth year]  DOS: 07/18/2021  Specialty: Interventional Pain Management  ?MRN: [id number]  Setting: Ambulatory outpatient  PCP: [name], [place]  ?Type: Established Patient    Referring Provider: [name], MD  ?Location: Office  Delivery: Face-to-face    ? ?HPI  ?Lindsay Austin, a 61 y.o. year old female, is here today because of her Chronic pain syndrome [G89.4]. Lindsay Austin primary complain today is Back Pain (Thoracic - lumbar fluctuates between right and left ) and Neck Pain (Midline ) ? ?Last encounter: My last encounter with her was on 07/04/21 ? ?Pertinent problems: Lindsay Austin has Hx of lumbosacral spine surgery; History of lumbar fusion (L5-S1 fusion); Failed spinal cord stimulator, sequela; Cervical fusion syndrome (C4-C7); Chronic pain syndrome; Major depressive disorder with current active episode; Lumbar spondylosis; Cervical spondylosis without myelopathy; and Spinal stenosis in cervical region on their pertinent problem list. ?Pain Assessment: Severity of Chronic pain is reported as a 4 /10. Location: Back (neck) Left, Right, Mid, Lower/neck pain into shoulders and down the arms into fingers.  back pain hips and legs worse on the right. Onset: More than a month ago. Quality: Discomfort, Constant, Numbness, Aching, Burning, Shooting, Sharp, Cramping. Timing: Constant. Modifying factor(s): rest and positioning with pillows.Marland Kitchen ?Vitals:  height is '5\' 4"'  (1.626 m) and weight is 179 lb (81.2 kg).  Her temporal temperature is 97.7 ?F (36.5 ?C). Her blood pressure is 138/84 and her pulse is 70. Her respiration is 16 and oxygen saturation is 96%.  ? ?Reason for encounter: medication management.   ? ? ?-worsening migraines, is being managed by neurology.  Tried Emgality as well as Maxalt as needed for migraines.  These were not effective.  Neurology recommended that she trial Botox injections which she did on 02/14/2021 that provided her with approximately 90% pain relief for about 3 months.  She was scheduled to have Botox injections today however patient missed her morning appointment and was late.  Today we will only do medication management given that she was delayed and will reschedule for Botox injections next week. ? ?-refill of Fentanyl patch as below for chronic pain syndrome.  No change in dose.  No side effects.  UDS up-to-date and appropriate. ? ? ?Pharmacotherapy Assessment  ?Analgesic: fentanyl patch  50 mcg an hour; 120 MME.  ? ?Monitoring: ?[place] PMP: PDMP reviewed during this encounter.       ?Pharmacotherapy: No side-effects or adverse reactions reported. ?Compliance: No problems identified. ?Effectiveness: Clinically acceptable.  UDS:  ?Summary  ?Date Value Ref Range Status  ?04/18/2021 Note  Final  ?  Comment:  ?  ==================================================================== ?Tox[employer] Select 13 (MW) ?==================================================================== ?Test                             Result       Flag       Units ? ?Drug Present and Declared for Prescription Verification ?  Fentanyl  62           EXPECTED   ng/mg creat ?  Norfentanyl                    257          EXPECTED   ng/mg creat ?   Source of fentanyl is a scheduled prescription medication, including ?   IV, patch, and transmucosal formulations. Norfentanyl is an expected ?   metabolite of fentanyl. ? ?==================================================================== ?Test                       Result    Flag   Units      Ref Range ?  Creatinine              190              mg/dL      >=20 ?==================================================================== ?Declared Medications: ? The flagging and interpretation on this report are based on the ? following declared medications.  Unexpected results may arise from ? inaccuracies in the declared medications. ? ? **Note: The testing scope of this panel includes these medications: ? ? Fentanyl (Duragesic) ? ? **Note: The testing scope of this panel does not include the ? following reported medications: ? ? Albuterol ? Bupropion (Wellbutrin SR) ? Clopidogrel (Plavix) ? Dicyclomine (Bentyl) ? Escitalopram (Lexapro) ? Galcanezumab Tree surgeon) ? Methocarbamol (Robaxin) ? Multivitamin ? Naloxone (Narcan) ? Olmesartan (Benicar) ? Ondansetron (Zofran) ? Pregabalin (Lyrica) ? Rizatriptan (Maxalt) ? Sucralfate (Carafate) ? Trazodone (Desyrel) ? Triamcinolone (Kenalog) ?==================================================================== ?For clinical consultation, please call [phone]. ?==================================================================== ?  ?  ? ? ?ROS  ?Constitutional: Denies any fever or chills ?Gastrointestinal: No reported hemesis, hematochezia, vomiting, or acute GI distress ?Musculoskeletal: Migraines ?Neurological: No reported episodes of acute onset apraxia, aphasia, dysarthria, agnosia, amnesia, paralysis, loss of coordination, or loss of consciousness ? ?Medication Review  ?ARIPiprazole, albuterol, buPROPion, clopidogrel, dicyclomine, fentaNYL, gabapentin, methocarbamol, naloxone, ondansetron, pregabalin, rizatriptan, sucralfate, and traZODone ? ?History Review  ?Allergy: Lindsay Austin is allergic to methadone, other, and tape. ?Drug: Lindsay Austin  reports no history of drug use. ?Alcohol:  reports no history of alcohol use. ?Tobacco:  reports that she has never smoked. She has never used smokeless tobacco. ?Social: Lindsay Austin  reports that  she has never smoked. She has never used smokeless tobacco. She reports that she does not drink alcohol and does not use drugs. ?Medical:  has a past medical history of Anxiety, Arthritis, Bladder cancer ([place]), Cancer of kidney ([place]), DDD (degenerative disc disease), cervical, Depression, Failed back syndrome, Fibromyalgia, GERD (gastroesophageal reflux disease), Headache, Heart murmur, Hyperlipemia, Hypertension, IBS (irritable bowel syndrome), Kidney failure, Renal insufficiency, RSD (reflex sympathetic dystrophy), Seizure ([place]), Stomach ulcer, and Trigeminal neuralgia. ?Surgical: Lindsay Austin  has a past surgical history that includes Transurethral resection of bladder tumor with gyrus (turbt-gyrus) (2012, 2013); Nephroureterectomy (2011); Back surgery (1995); Cholecystectomy; Brain surgery (2004); trigeminal neuralgia (2004); Spinal cord stimulator implant (2004); colonscopy (2006); Posterior cervical fusion/foraminotomy (Bilateral, 07/05/2019); Posterior cervical laminectomy (Bilateral, 07/05/2019); and Breast biopsy (Left, 2017). ?Family: family history includes Dementia in her father; Diabetes in her mother; Heart disease in her mother; Kidney disease in her father. ? ?Laboratory Chemistry Profile  ? ?Renal ?Lab Results  ?Component Value Date  ? BUN 13 12/29/2020  ? CREATININE 1.44 (H) 12/29/2020  ? LABCREA 71 08/29/2019  ? GFRAA 51 (L) 08/31/2019  ? GFRNONAA 42 (L) 12/29/2020  ? ?  Hepatic ?  Lab Results  ?Component Value Date  ? AST 32 08/29/2019  ? ALT 12 08/29/2019  ? ALBUMIN 4.1 08/29/2019  ? ALKPHOS 66 08/29/2019  ? ?  ?Electrolytes ?Lab Results  ?Component Value Date  ? NA 139 12/29/2020  ? K 4.3 12/29/2020  ? CL 101 12/29/2020  ? CALCIUM 9.2 12/29/2020  ? MG 2.2 08/31/2019  ? ?  Bone ?No results found for: [place], [id number], [id number], [id number], 25OHVITD1, 25OHVITD2, 25OHVITD3, TESTOFREE, TESTOSTERONE ?  ?Inflammation (CRP: Acute Phase) (ESR: Chronic Phase) ?No results found for: CRP, ESRSEDRATE,  LATICACIDVEN ?    ?Note: Above Lab results reviewed. ? ? ?Physical Exam  ?General appearance: Well nourished, well developed, and well hydrated. In no apparent acute distress ?Mental status: Alert, oriented x 3

## 2021-07-18 NOTE — Progress Notes (Signed)
Nursing Pain Medication Assessment:  ?Safety precautions to be maintained throughout the outpatient stay will include: orient to surroundings, keep bed in low position, maintain call bell within reach at all times, provide assistance with transfer out of bed and ambulation.  ?Medication Inspection Compliance: Lindsay Austin did not comply with our request to bring her pills to be counted. She was reminded that bringing the medication bottles, even when empty, is a requirement. ? ?Medication: None brought in. ?Pill/Patch Count: None available to be counted. ?Bottle Appearance: No container available. Did not bring bottle(s) to appointment. ?Filled Date: N/A ?Last Medication intake:   3 days ago  ?

## 2021-07-19 ENCOUNTER — Encounter: Payer: Medicaid Other | Admitting: Student in an Organized Health Care Education/Training Program

## 2021-07-23 ENCOUNTER — Ambulatory Visit
Payer: Medicaid Other | Attending: Student in an Organized Health Care Education/Training Program | Admitting: Student in an Organized Health Care Education/Training Program

## 2021-07-23 ENCOUNTER — Encounter: Payer: Self-pay | Admitting: Student in an Organized Health Care Education/Training Program

## 2021-07-23 ENCOUNTER — Other Ambulatory Visit: Payer: Self-pay

## 2021-07-23 VITALS — BP 125/85 | HR 59 | Temp 97.1°F | Resp 16 | Ht 64.0 in | Wt 179.0 lb

## 2021-07-23 DIAGNOSIS — G43709 Chronic migraine without aura, not intractable, without status migrainosus: Secondary | ICD-10-CM | POA: Diagnosis not present

## 2021-07-23 MED ORDER — SODIUM CHLORIDE (PF) 0.9 % IJ SOLN
INTRAMUSCULAR | Status: AC
  Filled 2021-07-23: qty 10

## 2021-07-23 MED ORDER — ONABOTULINUMTOXINA 200 UNITS IJ SOLR
155.0000 [IU] | Freq: Once | INTRAMUSCULAR | Status: AC
  Administered 2021-07-23: 155 [IU] via INTRAMUSCULAR
  Filled 2021-07-23: qty 200

## 2021-07-23 NOTE — Patient Instructions (Addendum)
Post procedure instructions given to patient.  ? ?Upright x 4 hours ?Gentle application of cleanser and makeup  ?

## 2021-07-23 NOTE — Progress Notes (Signed)
PROVIDER NOTE: Information contained herein reflects review and annotations entered in association with encounter. Interpretation of such information and data should be left to medically-trained personnel. Information provided to patient can be located elsewhere in the medical record under "Patient Instructions". Document created using STT-dictation technology, any transcriptional errors that may result from process are unintentional.  ?  ?Patient: Lindsay Austin  Service Category: Procedure  Provider: [name], MD  ?DOB: 03/30/1961  DOS: 07/23/2021  Location: [facility]  ?MRN: [id number]  Setting: Ambulatory - outpatient  Referring Provider: [name], [place]  ?Type: Established Patient  Specialty: Interventional Pain Management  PCP: [name], [place]  ? ?Primary Reason for Visit: Interventional Pain Management Treatment. ?CC: migraine ? ? ?  ?Procedure:            ?Botox for migraine management #2 (#1 done 02/14/2021)               ? ? ?1. Chronic migraine without aura without status migrainosus, not intractable   ? ? ?Pain Score: ?Pre-procedure: 3 /10 ?Post-procedure: 3 /10  ? ?  ?Pre-op H&P Assessment:  ?Lindsay Austin is a 61 y.o. (year old), female patient, seen today for interventional treatment. 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 Lindsay Austin has a current medication list which includes the following prescription(s): albuterol, aripiprazole, bupropion, clopidogrel, dicyclomine, [START ON 08/09/2021] fentanyl, [START ON 09/08/2021] fentanyl, [START ON 10/08/2021] fentanyl, gabapentin,  methocarbamol, naloxone, ondansetron, pregabalin, rizatriptan, sucralfate, and trazodone. Her primarily concern today is the No chief complaint on file. ? ? ?Initial Vital Signs:  ?Pulse/HCG Rate: (!) 59  ?Temp: (!) 97.1 ?F (36.2 ?C) ?Resp: 16 ?BP: 125/85 ?SpO2: 98 % ? ?BMI: Estimated body mass index is 30.73 kg/m? as calculated from the following: ?  Height as of this encounter: '5\' 4"'$  (1.626 m). ?  Weight as of this encounter: 179 lb (81.2 kg). ? ?Risk Assessment: ?Allergies: Reviewed. She is allergic to methadone, other, and tape.  ?Allergy Precautions: None required ?Coagulopathies: Reviewed. None identified.  ?Blood-thinner therapy: None at this time ?Active Infection(s): Reviewed. None identified. Lindsay Austin is afebrile ? ?Site Confirmation: Lindsay Austin was asked to confirm the procedure and laterality before marking the site ?Procedure checklist: Completed ?Consent: Before the procedure and under the influence of no sedative(s), amnesic(s), or anxiolytics, the patient was informed of the treatment options, risks and possible complications. To fulfill our ethical and legal obligations, as recommended by the American Medical Association's Code of Ethics, I have informed the patient of my clinical impression; the nature and purpose of the treatment or procedure; the risks, benefits, and possible complications of the intervention; the alternatives, including doing nothing; the risk(s) and benefit(s) of the alternative treatment(s) or procedure(s); and the risk(s) and benefit(s) of doing nothing. ?The patient was provided information about the general risks and possible complications associated with the procedure. These may include, but are not limited to: failure to achieve desired goals, infection, bleeding, organ or nerve damage, allergic reactions, paralysis, and death. ?In addition, the patient was informed of those risks and complications associated to the procedure, such as failure to decrease pain; infection;  bleeding; organ or nerve damage with subsequent damage to sensory, motor, and/or autonomic systems, resulting  in permanent pain, numbness, and/or weakness of one or several areas of the body; allergic reactions; (i.e.: anaphylactic reaction); and/or death. ?Furthermore, the patient was informed of those risks and complications associated with the medications. These include, but are not limited to: allergic reactions (i.e.: anaphylactic or anaphylactoid reaction(s)); adrenal axis suppression; blood sugar elevation that in diabetics may result in ketoacidosis or comma; water retention that in patients with history of congestive heart failure may result in shortness of breath, pulmonary edema, and decompensation with resultant heart failure; weight gain; swelling or edema; medication-induced neural toxicity; particulate matter embolism and blood vessel occlusion with resultant organ, and/or nervous system infarction; and/or aseptic necrosis of one or more joints. ?Finally, the patient was informed that Medicine is not an exact science; therefore, there is also the possibility of unforeseen or unpredictable risks and/or possible complications that may result in a catastrophic outcome. The patient indicated having understood very clearly. We have given the patient no guarantees and we have made no promises. Enough time was given to the patient to ask questions, all of which were answered to the patient's satisfaction. Lindsay Austin has indicated that she wanted to continue with the procedure. ?Attestation: I, the ordering provider, attest that I have discussed with the patient the benefits, risks, side-effects, alternatives, likelihood of achieving goals, and potential problems during recovery for the procedure that I have provided informed consent. ?Date  Time: 07/23/2021 11:05 AM ? ?Pre-Procedure Preparation:  ?Monitoring: As per clinic protocol. Respiration, ETCO2, SpO2, BP, heart rate and rhythm monitor placed and checked  for adequate function ?Safety Precautions: Patient was assessed for positional comfort and pressure points before starting the procedure. ?Time-out: I initiated and conducted the "Time-out" before starting the procedure, as per protocol. The patient was asked to participate by confirming the accuracy of the "Time Out" information. Verification of the correct person, site, and procedure were performed and confirmed by me, the nursing staff, and the patient. "Time-out" conducted as per Joint Commission's Universal Protocol (UP.01.01.01). ?Time: 1139 ? ?Constitution and injection details as below: ?  ?1 x 200 unit vial of Botox brand onabotulinum toxin A was diluted with 4 mL preservative free saline, for a final concentration of 5 units/0.1 mL. ?Injection sites were prepared for injection with alcohol swab ? ?Botox was injected as follows: ? ?5 units left corrugator ?5 units right corrugator ?5 units procerus  ?5 units in 2 sites, right frontalis (10 units total) ?5units in 2 sites, left frontalis (10 units total) ?5 units in each of 4 sites right temporalis (20 units total) ?5 units in each of 4 sites left temporalis (20 units total) ?5 units in each of 3 sites right occipitalis (15 units total) ?5 units in each of 3 sites left occipitalis (15 units total) ?5 units in each of 2 sites right cervical paraspinal at approximate C3-4 level (10 units total) ?5 units in each of 2 sites left cervical paraspinal muscles at approximate C3-4 level (10 units total) ?5 units in each of 3 sites right trapezius (15 units total) ?5 units in each of 3 sites left trapezius (15 units total) ? ?45 units were wasted ? ?A total of 155 units was administered by Dr Holley Raring ? ? ? ? ? ? ? ?Post-operative Assessment:  ?Post-procedure Vital Signs:  ?Pulse/HCG Rate: (!) 59  ?Temp: (!) 97.1 ?F (36.2 ?C) ?Resp: 16 ?BP: 125/85 ?SpO2: 98 % ? ?EBL: None ? ?Complications: No immediate post-treatment complications observed by team, or reported by  patient. ? ?  Note: The patient tolerated the entire procedure well. A repeat set of vitals were taken after the procedure and the patient was kept under observation following institutional policy, for this type of proced

## 2021-07-23 NOTE — Progress Notes (Signed)
Safety precautions to be maintained throughout the outpatient stay will include: orient to surroundings, keep bed in low position, maintain call bell within reach at all times, provide assistance with transfer out of bed and ambulation.  

## 2021-07-24 ENCOUNTER — Telehealth: Payer: Self-pay | Admitting: *Deleted

## 2021-07-24 NOTE — Telephone Encounter (Signed)
Attempted to call for post procedure follow-up. Message left. 

## 2021-08-21 ENCOUNTER — Ambulatory Visit
Payer: Medicaid Other | Attending: Student in an Organized Health Care Education/Training Program | Admitting: Student in an Organized Health Care Education/Training Program

## 2021-08-21 ENCOUNTER — Encounter: Payer: Self-pay | Admitting: Student in an Organized Health Care Education/Training Program

## 2021-08-21 DIAGNOSIS — G43709 Chronic migraine without aura, not intractable, without status migrainosus: Secondary | ICD-10-CM | POA: Diagnosis not present

## 2021-08-21 DIAGNOSIS — G894 Chronic pain syndrome: Secondary | ICD-10-CM | POA: Diagnosis not present

## 2021-08-21 NOTE — Progress Notes (Signed)
Patient: Lindsay Austin  Service Category: E/M  Provider: [name], MD  ?DOB: 03/09/1961  DOS: 08/21/2021  Location: Office  ?MRN: [id number]  Setting: Ambulatory outpatient  Referring Provider: [name], [place]  ?Type: Established Patient  Specialty: Interventional Pain Management  PCP: [name], [place]  ?Location: Remote location  Delivery: TeleHealth    ? ?Virtual Encounter - Pain Management ?PROVIDER NOTE: Information contained herein reflects review and annotations entered in association with encounter. Interpretation of such information and data should be left to medically-trained personnel. Information provided to patient can be located elsewhere in the medical record under "Patient Instructions". Document created using STT-dictation technology, any transcriptional errors that may result from process are unintentional.  ?  ?Contact & Pharmacy ?Preferred: [phone] ?Home: [phone] (home) ?Mobile: [phone] (mobile) ?E-mail: [email or web address]  ?[name] [place] [place]?Phone: [phone]: [phone]?  ?Pre-screening  ?Ms. KLadouceuroffered "in-person" vs "virtual" encounter. She indicated preferring virtual for this encounter.  ? ?Reason ?COVID-19*  Social distancing based on CDC and AMA recommendations.  ? ?I contacted JLetishia Elliotton 08/21/2021 via telephone.      I clearly identified myself as BGillis Santa MD. I verified that I was speaking with the correct person using two identifiers (Name: JRen Grasse and date of birth: 811-05-1960. ? ?Consent ?I sought verbal advanced consent from [name] virtual visit interactions. I informed Ms. KCadenheadof possible security and privacy concerns, risks, and limitations associated with providing "not-in-person" medical evaluation and management services. I also informed Ms. KHenthornof the availability of "in-person" appointments. Finally, [name], fully or partially, financially responsible for it. Ms. KRaetherexpressed understanding and agreed to proceed.  ? ?Historic Elements   ?Ms. JLexie Koehlis a 61y.o. year old, female patient evaluated today after our last contact on 07/23/2021. Ms. KHuseby has a past medical history of Anxiety, Arthritis, [place], [place], DDD (degenerative disc disease), cervical, Depression, Failed back syndrome, Fibromyalgia, GERD (gastroesophageal reflux disease), Headache, Heart murmur, Hyperlipemia, Hypertension, IBS (irritable bowel syndrome), Kidney failure, Renal insufficiency, RSD (reflex sympathetic dystrophy), Seizure ([place], Stomach ulcer, and Trigeminal neuralgia. 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 Ms. KSterbenzhas a current medication list which includes the following prescription(s): albuterol, aripiprazole, bupropion, clopidogrel, dicyclomine, fentanyl, [START ON 09/08/2021] fentanyl, [START ON 10/08/2021] fentanyl, gabapentin, methocarbamol, naloxone, ondansetron, pregabalin, rizatriptan, sucralfate, and trazodone. She  reports that she has never smoked. She has never used smokeless tobacco. She reports that she does not drink alcohol and does not use drugs. Ms. KKarpelis allergic to methadone, other, and tape.  ? ?HPI  ?Today, she is being contacted for a post-procedure assessment. ? ? ?Post-procedure evaluation  ?  ?Procedure:            ?Botox for migraine management #2 07/23/21 (#1 done 02/14/2021)               ? ? ?1.  Chronic migraine without aura without status migrainosus, not intractable   ? ? ?Pain Score: ?Pre-procedure: 3 /10 ?Post-procedure: 3 /  10  ? ?   ?Effectiveness:  ?Initial hour after procedure: 0 %  ?Subsequent 4-6 hours post-procedure: 0 %  ?Analgesia past initial 6 hours: 50 % (a lot of issues after the injections for a few days, soreness and pain at the sites.)  ?Ongoing improvement:  ?Analgesic:  60-70%, significant reduction in migraine frequency and intensity. ? ?Repeat after 3 months ? ?Pharmacotherapy Assessment  ? ?Opioid Analgesic:  fentanyl patch  50 mcg an hour; 120 MME.  ? ?Monitoring: ?[place] PMP: PDMP reviewed during this encounter.       ?Pharmacotherapy: No side-effects or adverse reactions reported. ?Compliance: No problems identified. ?Effectiveness: Clinically acceptable. ?Plan: Refer to "POC". UDS:  ?Summary  ?Date Value Ref Range Status  ?04/18/2021 Note  Final  ?  Comment:  ?  ==================================================================== ?ToxASSURE Select 13 (MW) ?==================================================================== ?Test                             Result       Flag       Units ? ?Drug Present and Declared for Prescription Verification ?  Fentanyl                       62           EXPECTED   ng/mg creat ?  Norfentanyl                    257          EXPECTED   ng/mg creat ?   Source of fentanyl is a scheduled prescription medication, including ?   IV, patch, and transmucosal formulations. Norfentanyl is an expected ?   metabolite of fentanyl. ? ?==================================================================== ?Test                      Result    Flag   Units      Ref Range ?  Creatinine              190              mg/dL      >=20 ?==================================================================== ?Declared Medications: ? The flagging and interpretation on this report are based on the ? following declared medications.  Unexpected results may arise from ? inaccuracies  in the declared medications. ? ? **Note: The testing scope of this panel includes these medications: ? ? Fentanyl (Duragesic) ? ? **Note: The testing scope of this panel does not include the ? following reported medications: ? ? Albuterol ? Bupropion (Wellbutrin SR) ? Clopidogrel (Plavix) ? Dicyclomine (Bentyl) ? Escitalopram (Lexapro) ? Galcanezumab Tree surgeon) ? Methocarbamol (Robaxin) ? Multivitamin ? Naloxone (Narcan) ? Olmesartan (Benicar) ? Ondansetron (Zofran) ? Pregabalin (Lyrica) ? Rizatriptan (Maxalt) ? Sucralfate (Carafate) ? Trazodone (Desyrel) ? Triamcinolone (Kenalog) ?==================================================================== ?For clinical consultation, please call [phone]. ?==================================================================== ?  ?  ? ?Laboratory Chemistry Profile  ? ?Renal ?Lab Results  ?Component Value Date  ? BUN 13 12/29/2020  ? CREATININE 1.44 (H) 12/29/2020  ? LABCREA 71 08/29/2019  ? GFRAA 51 (L) 08/31/2019  ? GFRNONAA 42 (L) 12/29/2020  ?  Hepatic ?Lab Results  ?Component Value Date  ? AST 32 08/29/2019  ? ALT 12 08/29/2019  ? ALBUMIN 4.1 08/29/2019  ? ALKPHOS 66 08/29/2019  ?  ?Electrolytes ?Lab Results  ?Component Value Date  ? NA 139 12/29/2020  ? K 4.3 12/29/2020  ? CL 101  12/29/2020  ? CALCIUM 9.2 12/29/2020  ? MG 2.2 08/31/2019  ?  Bone ?No results found for: Smiley, [id number], [id number], [id number], 25OHVITD1, 25OHVITD2, 25OHVITD3, TESTOFREE, TESTOSTERONE  ?Inflammation (CRP: Acute Phase) (ESR: Chronic Phase) ?No results found for: CRP, ESRSEDRATE, LATICACIDVEN    ?  ? ?Note: Above Lab results reviewed. ? ? ?Assessment  ?The primary encounter diagnosis was Chronic migraine without aura without status migrainosus, not intractable. A diagnosis of Chronic pain syndrome was also pertinent to this visit. ? ?Plan of Care  ? ?1. Chronic migraine without aura without status migrainosus, not intractable ?- Chemo Denervation; Future ? ?2. Chronic pain  syndrome ?Continue with medication management ? ? ?Orders:  ?Orders Placed This Encounter  ?Procedures  ? Chemo Denervation  ?  Standing Status:   Future  ?  Standing Expiration Date:   08/22/2022  ?  Scheduling Instructi

## 2021-09-21 ENCOUNTER — Other Ambulatory Visit: Payer: Self-pay | Admitting: Neurology

## 2021-09-21 DIAGNOSIS — M4807 Spinal stenosis, lumbosacral region: Secondary | ICD-10-CM

## 2021-09-26 ENCOUNTER — Ambulatory Visit
Admission: RE | Admit: 2021-09-26 | Discharge: 2021-09-26 | Disposition: A | Discharge status: Home / Self Care | Payer: Medicaid Other | Source: Ambulatory Visit | Attending: Neurology | Admitting: Neurology

## 2021-09-26 DIAGNOSIS — M4807 Spinal stenosis, lumbosacral region: Secondary | ICD-10-CM | POA: Insufficient documentation

## 2021-10-18 ENCOUNTER — Other Ambulatory Visit: Payer: Self-pay | Admitting: Family Medicine

## 2021-10-18 DIAGNOSIS — Z1231 Encounter for screening mammogram for malignant neoplasm of breast: Secondary | ICD-10-CM

## 2021-10-31 ENCOUNTER — Ambulatory Visit: Payer: Medicaid Other | Admitting: Student in an Organized Health Care Education/Training Program

## 2021-11-01 ENCOUNTER — Ambulatory Visit
Payer: Medicaid Other | Attending: Student in an Organized Health Care Education/Training Program | Admitting: Student in an Organized Health Care Education/Training Program

## 2021-11-01 ENCOUNTER — Encounter: Payer: Self-pay | Admitting: Student in an Organized Health Care Education/Training Program

## 2021-11-01 VITALS — BP 106/69 | HR 65 | Temp 97.9°F | Resp 14 | Ht 64.0 in | Wt 186.0 lb

## 2021-11-01 DIAGNOSIS — Z981 Arthrodesis status: Secondary | ICD-10-CM

## 2021-11-01 DIAGNOSIS — T85192S Other mechanical complication of implanted electronic neurostimulator (electrode) of spinal cord, sequela: Secondary | ICD-10-CM

## 2021-11-01 DIAGNOSIS — Q761 Klippel-Feil syndrome: Secondary | ICD-10-CM | POA: Insufficient documentation

## 2021-11-01 DIAGNOSIS — M47816 Spondylosis without myelopathy or radiculopathy, lumbar region: Secondary | ICD-10-CM | POA: Diagnosis not present

## 2021-11-01 DIAGNOSIS — G5701 Lesion of sciatic nerve, right lower limb: Secondary | ICD-10-CM | POA: Insufficient documentation

## 2021-11-01 DIAGNOSIS — G894 Chronic pain syndrome: Secondary | ICD-10-CM

## 2021-11-01 DIAGNOSIS — S7001XS Contusion of right hip, sequela: Secondary | ICD-10-CM | POA: Diagnosis present

## 2021-11-01 DIAGNOSIS — G9050 Complex regional pain syndrome I, unspecified: Secondary | ICD-10-CM | POA: Diagnosis not present

## 2021-11-01 DIAGNOSIS — M533 Sacrococcygeal disorders, not elsewhere classified: Secondary | ICD-10-CM | POA: Insufficient documentation

## 2021-11-01 DIAGNOSIS — M47812 Spondylosis without myelopathy or radiculopathy, cervical region: Secondary | ICD-10-CM

## 2021-11-01 DIAGNOSIS — M4802 Spinal stenosis, cervical region: Secondary | ICD-10-CM

## 2021-11-01 DIAGNOSIS — G43709 Chronic migraine without aura, not intractable, without status migrainosus: Secondary | ICD-10-CM | POA: Diagnosis present

## 2021-11-01 MED ORDER — FENTANYL 50 MCG/HR TD PT72: 1.0000 | MEDICATED_PATCH | TRANSDERMAL | 0 refills | Status: AC

## 2021-11-01 MED ORDER — FENTANYL 50 MCG/HR TD PT72: 1.0000 | MEDICATED_PATCH | TRANSDERMAL | 0 refills | Status: DC

## 2021-11-01 NOTE — Patient Instructions (Signed)
Botox  What is Botox (Onabotulinumtoxina)?  Botox (Onabotulinumtoxina), also called botulinum toxin type A, is made from the bacteria that causes botulism.  Botulinum toxin blocks nerve activity in the muscles, causing temporary reduction in muscle activity.  Botox is made from human plasma (part of the blood) which may contain viruses and other infectious agents.  Donated plasma is tested and treated to reduce the risk of it containing infectious agents, but there is still a small possibility that it could transmit disease.  Talk with your doctor about the risk and benefits of using this medication.  FDA pregnancy category C.  It is not known whether botulinum toxin will harm an unborn baby.  Tell your doctor if you are pregnant or plan to become pregnant while using this medication.  It is not known whether botulinum toxin passes into breast milk or if it could harm a nursing baby.  Do not receive this medication if you are breastfeeding without discussing with your doctor.  Botox is used to prevent chronic migraine headaches in adults who have migraines for more than 15 days per month, each lasting 4 hours or longer.  Botox should not be used to treat common tension headache.  To make sure you can safely use Botox, tell your doctor if you have any of these conditions:  Amyotrophic lateral sclerosis (ALS or Lou Gehrig's Disease)  Myasthenia Gravis  Breathing disorders  Lambert-Eaton Syndrome  Problems swallowing  Facial muscle weakness  A change in normal appearance of your face  A seizure disorder  Bleeding problems  Heart disease  If you have had or will have surgery, or if you have received other Botulinum toxin injections in the past 4 months    What are the possible side effects of Botox?  **Call your doctor immediately if you have any of these serious side effects which can occur up to several weeks after an injection:  Trouble breathing, talking or  swallowing  Hoarse voice, drooping eyelids  Unusual or severe muscle weakness (especially in a body area that was not injected  with the medication)  Loss of bladder control  Problems with vision  Crusting or drainage from your eyes  Severe skin rash or itching  Fast, slow, or uneven heartbeats  Chest pain or heavy feeling, pain spreading to the arm or shoulder, general ill feeling  Less side effects may include:  Muscle weakness near where the medicine was injected  Bruising, bleeding, pain, redness or swelling where the injection was given  Headache, muscle stiffness, neck or back pain  Fever, cough, sore throat, runny nose, flu like symptoms  Dizziness, drowsiness, tired feeling  Nausea, diarrhea, stomach pain, loss of appetite  Dry mouth, dry eyes, ringing in ears  Increased sweating in areas other than under the arms  Itchy, watery eyes, increased sensitivity to light  Eyelid swelling or bruising  Pre and Post treatment Instructions  Pre Treatment Instructions:  3 days before treatment, avoid topical facial or "anti aging" products.  Avoid waxing, bleaching, tweezing or the use of hair removal cream.  7 days before treatment, avoid blood thinning over the counter medications such as aspirin, Motrin, Aleve and Ibuprofen.  Also avoid herbal supplements such as Garlic, Vitamin E, Ginkgo Biloba, [place] Wart and Omega-3 capsules.  Do not drink alcoholic beverages 24 hours before or after your treatment to avoid bruising.  Inform your provider if you have perioral herpes to receive advice on antiviral therapy prior to treatment.  Day of   Treatment:  Arrive to office with a "clean face".  Do not wear make-up.  You may experience a mild amount of tenderness or a stinging sensation following injection.  Redness and swelling are normal.  Some bruising may also be visible.  You may experience some tenderness at the treatment site that can last for a few hours  or a few days.  Immediately after treatment:  It is best to try to exercise your treated muscles for 1-2 hours after treatment (practice frowning, raising your eyebrows and squinting).  This helps to work the BOTOX into your muscles.  Stay in a vertical position for four hours following injections.  Do not "rest your head" or lie down.  Sit upright.  You may apply an ice or cold gel pack to the area as this helps reduce swelling and the potential for bruising.  Once you have iced the area and any pinpoint bleeding from the injection site has subsided, you may begin wearing makeup.  Avoid placing excessive pressure on the treated areas for the first few days; when cleansing your face or applying make-up, be very gentle.  Avoid exercise or strenuous activities for the remainder of the treatment day; you may resume other normal activities/routines immediately.  You may take Acetaminophen/Tylenol if you experience any mild tenderness or discomfort.  Avoid extended UV exposure until any redness/swelling has subsided.  Be sure to apply an SPF 30 or higher sunscreen.  Wait a minimum of 24 hours (or as directed by your provider) before receiving any skin care or laser treatment.  Please contact us at [phone] if you have any questions or concerns    

## 2021-11-01 NOTE — Progress Notes (Signed)
PROVIDER NOTE: Information contained herein reflects review and annotations entered in association with encounter. Interpretation of such information and data should be left to medically-trained personnel. Information provided to patient can be located elsewhere in the medical record under "Patient Instructions". Document created using STT-dictation technology, any transcriptional errors that may result from process are unintentional.    Patient: Lindsay Austin  Service Category: E/M  Provider: [name], MD  DOB: Apr 25, 1961  DOS: 11/01/2021  Specialty: Interventional Pain Management  MRN: [id number]  Setting: Ambulatory outpatient  PCP: [name], PA  Type: Established Patient    Referring Provider: [name], PA  Location: Office  Delivery: Face-to-face     HPI  Lindsay Austin, a 61 y.o. year old female, is here today because of her Chronic migraine without aura without status migrainosus, not intractable [G43.709]. Lindsay Austin primary complain today is Back Pain (lower)  Last encounter: My last encounter with her was on 08/21/21  Pertinent problems: Lindsay Austin has Hx of lumbosacral spine surgery; History of lumbar fusion (L5-S1 fusion); Failed spinal cord stimulator, sequela; Cervical fusion syndrome (C4-C7); Chronic pain syndrome; Major depressive disorder with current active episode; Lumbar spondylosis; Cervical spondylosis without myelopathy; and Spinal stenosis in cervical region on their pertinent problem list. Pain Assessment: Severity of Chronic pain is reported as a 3 /10. Location:    /both legs. Onset: More than a month ago. Quality: Aching, Sharp, Radiating, Other (Comment) (deep). Timing: Constant. Modifying factor(s): lying bed with support pillows. Vitals:  height is '5\' 4"'  (1.626 m) and weight is 186 lb (84.4 kg). Her temporal temperature is 97.9 F (36.6 C). Her blood pressure is 106/69 and her pulse is 65. Her respiration is 14 and oxygen saturation is 94%.   Reason for  encounter: medication management.    Patient presents today for medication management.  No significant change in medical history.  [name] is having increased migraines and would like to get scheduled for Botox which we will plan on doing next week.  She states that she may be moving to [place] in October but she would still like to continue her pain management care here.  Otherwise refill fentanyl patch as below.  No change in dose.  UDS up-to-date and appropriate.    Pharmacotherapy Assessment  Analgesic:  fentanyl patch  50 mcg an hour; 120 MME.   Monitoring: [place] PMP: PDMP reviewed during this encounter.       Pharmacotherapy: No side-effects or adverse reactions reported. Compliance: No problems identified. Effectiveness: Clinically acceptable.  UDS:  Summary  Date Value Ref Range Status  04/18/2021 Note  Final    Comment:    ==================================================================== [employer] Select 13 (MW) ==================================================================== Test                             Result       Flag       Units  Drug Present and Declared for Prescription Verification   Fentanyl                       62           EXPECTED   ng/mg creat   Norfentanyl                    257          EXPECTED   ng/mg creat    Source of fentanyl is a scheduled prescription  medication, including    IV, patch, and transmucosal formulations. Norfentanyl is an expected    metabolite of fentanyl.  ==================================================================== Test                      Result    Flag   Units      Ref Range   Creatinine              190              mg/dL      >=20 ==================================================================== Declared Medications:  The flagging and interpretation on this report are based on the  following declared medications.  Unexpected results may arise from  inaccuracies in the declared medications.   **Note:  The testing scope of this panel includes these medications:   Fentanyl (Duragesic)   **Note: The testing scope of this panel does not include the  following reported medications:   Albuterol  Bupropion (Wellbutrin SR)  Clopidogrel (Plavix)  Dicyclomine (Bentyl)  Escitalopram (Lexapro)  Galcanezumab (Emgality)  Methocarbamol (Robaxin)  Multivitamin  Naloxone (Narcan)  Olmesartan (Benicar)  Ondansetron (Zofran)  Pregabalin (Lyrica)  Rizatriptan (Maxalt)  Sucralfate (Carafate)  Trazodone (Desyrel)  Triamcinolone (Kenalog) ==================================================================== For clinical consultation, please call [phone]. ====================================================================       ROS  Constitutional: Denies any fever or chills Gastrointestinal: No reported hemesis, hematochezia, vomiting, or acute GI distress Musculoskeletal:  Low back, bilateral hip, right greater than left SI joint and groin pain Neurological: No reported episodes of acute onset apraxia, aphasia, dysarthria, agnosia, amnesia, paralysis, loss of coordination, or loss of consciousness  Medication Review  albuterol, buPROPion, clopidogrel, dicyclomine, fentaNYL, methocarbamol, naloxone, ondansetron, pregabalin, risperiDONE, rizatriptan, sucralfate, and traZODone  History Review  Allergy: Lindsay Austin is allergic to methadone, other, and tape. Drug: Lindsay Austin  reports no history of drug use. Alcohol:  reports no history of alcohol use. Tobacco:  reports that she has never smoked. She has never used smokeless tobacco. Social: Lindsay Austin  reports that she has never smoked. She has never used smokeless tobacco. She reports that she does not drink alcohol and does not use drugs. Medical:  has a past medical history of Anxiety, Arthritis, Bladder cancer ([place]), Cancer of kidney ([place]), DDD (degenerative disc disease), cervical, Depression, Failed back syndrome, Fibromyalgia, GERD  (gastroesophageal reflux disease), Headache, Heart murmur, Hyperlipemia, Hypertension, IBS (irritable bowel syndrome), Kidney failure, Renal insufficiency, RSD (reflex sympathetic dystrophy), Seizure ([place]), Stomach ulcer, and Trigeminal neuralgia. Surgical: Ms. Hawkey  has a past surgical history that includes Transurethral resection of bladder tumor with gyrus (turbt-gyrus) (2012, 2013); Nephroureterectomy (2011); Back surgery (1995); Cholecystectomy; Brain surgery (2004); trigeminal neuralgia (2004); Spinal cord stimulator implant (2004); colonscopy (2006); Posterior cervical fusion/foraminotomy (Bilateral, 07/05/2019); Posterior cervical laminectomy (Bilateral, 07/05/2019); and Breast biopsy (Left, 2017). Family: family history includes Dementia in her father; Diabetes in her mother; Heart disease in her mother; Kidney disease in her father.  Laboratory Chemistry Profile   Renal Lab Results  Component Value Date   BUN 13 12/29/2020   CREATININE 1.44 (H) 12/29/2020   LABCREA 71 08/29/2019   GFRAA 51 (L) 08/31/2019   GFRNONAA 42 (L) 12/29/2020     Hepatic Lab Results  Component Value Date   AST 32 08/29/2019   ALT 12 08/29/2019   ALBUMIN 4.1 08/29/2019   ALKPHOS 66 08/29/2019     Electrolytes Lab Results  Component Value Date   NA 139 12/29/2020   K 4.3 12/29/2020   CL 101  12/29/2020   CALCIUM 9.2 12/29/2020   MG 2.2 08/31/2019     Bone No results found for: "VD25OH", "VD125OH2TOT", "HQ1975OI3", "GP4982ME1", "25OHVITD1", "25OHVITD2", "25OHVITD3", "TESTOFREE", "TESTOSTERONE"   Inflammation (CRP: Acute Phase) (ESR: Chronic Phase) No results found for: "CRP", "ESRSEDRATE", "LATICACIDVEN"     Note: Above Lab results reviewed.   Physical Exam  General appearance: Well nourished, well developed, and well hydrated. In no apparent acute distress Mental status: Alert, oriented x 3 (person, place, & time)       Respiratory: No evidence of acute respiratory distress Eyes: PERLA Vitals:  BP 106/69   Pulse 65   Temp 97.9 F (36.6 C) (Temporal)   Resp 14   Ht '5\' 4"'  (1.626 m)   Wt 186 lb (84.4 kg)   SpO2 94%   BMI 31.93 kg/m  BMI: Estimated body mass index is 31.93 kg/m as calculated from the following:   Height as of this encounter: '5\' 4"'  (1.626 m).   Weight as of this encounter: 186 lb (84.4 kg). Ideal: Ideal body weight: 54.7 kg (120 lb 9.5 oz) Adjusted ideal body weight: 66.6 kg (146 lb 12.1 oz)  Lumbar Spine Area Exam  Skin & Axial Inspection: Well healed scar from previous spine surgery detected Alignment: Symmetrical Functional ROM: Pain restricted ROM affecting primarily the right Stability: No instability detected Muscle Tone/Strength: Functionally intact. No obvious neuro-muscular anomalies detected. Sensory (Neurological): Musculoskeletal pain pattern     Lower Extremity Exam      Side: Right lower extremity   Side: Left lower extremity  Stability: No instability observed           Stability: No instability observed          Skin & Extremity Inspection: Skin color, temperature, and hair growth are WNL. No peripheral edema or cyanosis. No masses, redness, swelling, asymmetry, or associated skin lesions. No contractures.   Skin & Extremity Inspection: Skin color, temperature, and hair growth are WNL. No peripheral edema or cyanosis. No masses, redness, swelling, asymmetry, or associated skin lesions. No contractures.  Functional ROM: Pain restricted ROM for all joints of the lower extremity           Functional ROM: Pain restricted ROM for all joints of the lower extremity          Muscle Tone/Strength: Functionally intact. No obvious neuro-muscular anomalies detected.   Muscle Tone/Strength: Functionally intact. No obvious neuro-muscular anomalies detected.  Sensory (Neurological): Unimpaired         Sensory (Neurological): Unimpaired        DTR: Patellar: deferred today Achilles: deferred today Plantar: deferred today   DTR: Patellar: deferred  today Achilles: deferred today Plantar: deferred today  Palpation: No palpable anomalies   Palpation: No palpable anomalies    Assessment   Status Diagnosis  Having a Flare-up Controlled Controlled 1. Chronic migraine without aura without status migrainosus, not intractable   2. Sacroiliac joint dysfunction of right side   3. Lumbar spondylosis   4. RSD (reflex sympathetic dystrophy)   5. Cervical fusion syndrome (C4-T2)   6. Piriformis syndrome of right side   7. Spinal stenosis in cervical region   8. Chronic pain syndrome   9. Lumbar facet arthropathy   10. History of lumbar fusion   11. Cervical spondylosis without myelopathy   12. Failed spinal cord stimulator, sequela   13. Contusion of right hip, sequela   14. Cervical facet joint syndrome       Plan of Care  Ms.  Seraphim Affinito has a current medication list which includes the following long-term medication(s): albuterol, bupropion, dicyclomine, rizatriptan, sucralfate, trazodone, and risperidone.  Pharmacotherapy (Medications Ordered): Meds ordered this encounter  Medications   fentaNYL (DURAGESIC) 50 MCG/HR    Sig: Place 1 patch onto the skin every 3 (three) days. Most last 30 days.    Dispense:  10 patch    Refill:  0    [place] STOP ACT - Not applicable. Fill one day early if pharmacy is closed on scheduled refill date.   fentaNYL (DURAGESIC) 50 MCG/HR    Sig: Place 1 patch onto the skin every 3 (three) days. Most last 30 days.    Dispense:  10 patch    Refill:  0    [place] STOP ACT - Not applicable. Fill one day early if pharmacy is closed on scheduled refill date.   fentaNYL (DURAGESIC) 50 MCG/HR    Sig: Place 1 patch onto the skin every 3 (three) days. Most last 30 days.    Dispense:  10 patch    Refill:  0    [place] STOP ACT - Not applicable. Fill one day early if pharmacy is closed on scheduled refill date.   Follow-up for Botox for migraine management next week.  Orders Placed This Encounter  Procedures   Chemo  Denervation    Standing Status:   Future    Standing Expiration Date:   11/02/2022    Scheduling Instructions:     With Dr Holley Raring for migraine management     Continue Lyrica as prescribed: 75 mg TID   Follow-up plan:   Return in about 6 days (around 11/07/2021) for Botox at 2 pm.   Recent Visits Date Type Provider Dept  08/21/21 Office Visit [name], MD [facility]  Showing recent visits within past 90 days and meeting all other requirements Today's Visits Date Type Provider Dept  11/01/21 Office Visit [name], MD [facility]  Showing today's visits and meeting all other requirements Future Appointments Date Type Provider Dept  11/07/21 Appointment [name], MD [facility]  Showing future appointments within next 90 days and meeting all other requirements  I discussed the assessment and treatment plan with the patient. The patient was provided an opportunity to ask questions and all were answered. The patient agreed with the plan and demonstrated an understanding of the instructions.  Patient advised to call back or seek an in-person evaluation if the symptoms or condition worsens.  Duration of encounter: 35 minutes.  Note by: [name], MD Date: 11/01/2021; Time: 11:31 AM

## 2021-11-01 NOTE — Progress Notes (Signed)
Nursing Pain Medication Assessment:  Safety precautions to be maintained throughout the outpatient stay will include: orient to surroundings, keep bed in low position, maintain call bell within reach at all times, provide assistance with transfer out of bed and ambulation.  Medication Inspection Compliance: Pill count conducted under aseptic conditions, in front of the patient. Neither the pills nor the bottle was removed from the patient's sight at any time. Once count was completed pills were immediately returned to the patient in their original bottle.  Medication: Fentanyl patch Pill/Patch Count:  3 of 10 pills remain Pill/Patch Appearance: Markings consistent with prescribed medication Bottle Appearance: Standard pharmacy container. Clearly labeled. Filled Date: 6 / 17 / 2023 Last Medication intake:   last applied yesterday. Safety precautions to be maintained throughout the outpatient stay will include: orient to surroundings, keep bed in low position, maintain call bell within reach at all times, provide assistance with transfer out of bed and ambulation.

## 2021-11-07 ENCOUNTER — Encounter: Payer: Self-pay | Admitting: Student in an Organized Health Care Education/Training Program

## 2021-11-07 ENCOUNTER — Ambulatory Visit
Payer: Medicaid Other | Attending: Student in an Organized Health Care Education/Training Program | Admitting: Student in an Organized Health Care Education/Training Program

## 2021-11-07 VITALS — BP 114/69 | HR 59 | Temp 97.9°F | Resp 16 | Ht 64.0 in | Wt 183.0 lb

## 2021-11-07 DIAGNOSIS — G43709 Chronic migraine without aura, not intractable, without status migrainosus: Secondary | ICD-10-CM | POA: Diagnosis present

## 2021-11-07 MED ORDER — ONABOTULINUMTOXINA 200 UNITS IJ SOLR
155.0000 [IU] | Freq: Once | INTRAMUSCULAR | Status: AC
  Administered 2021-11-07: 200 [IU] via INTRAMUSCULAR
  Filled 2021-11-07: qty 200

## 2021-11-07 MED ORDER — SODIUM CHLORIDE (PF) 0.9 % IJ SOLN
INTRAMUSCULAR | Status: AC
  Filled 2021-11-07: qty 10

## 2021-11-07 NOTE — Progress Notes (Signed)
PROVIDER NOTE: Information contained herein reflects review and annotations entered in association with encounter. Interpretation of such information and data should be left to medically-trained personnel. Information provided to patient can be located elsewhere in the medical record under "Patient Instructions". Document created using STT-dictation technology, any transcriptional errors that may result from process are unintentional.    Patient: Lindsay Austin  Service Category: Procedure  Provider: [name], MD  DOB: [birth year]  DOS: 11/07/2021  Location: [facility]  MRN: [id number]  Setting: Ambulatory - outpatient  Referring Provider: [name], PA  Type: Established Patient  Specialty: Interventional Pain Management  PCP: [name], [place]   Primary Reason for Visit: Interventional Pain Management Treatment. CC: migraine     Procedure:            Botox for migraine management #3 (#1 done 02/14/2021, #2 07/23/21)                 1. Chronic migraine without aura without status migrainosus, not intractable     Pain Score: Pre-procedure: 2 /10 Post-procedure: 2 /10     Pre-op H&P Assessment:  Lindsay Austin is a 61 y.o. (year old), female patient, seen today for interventional treatment. 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 Lindsay Austin has a current medication list which includes the following prescription(s): albuterol, bupropion, dicyclomine, [START ON 11/12/2021] fentanyl, [START ON 12/12/2021] fentanyl, [START ON 01/11/2022] fentanyl, methocarbamol, naloxone, ondansetron,  pregabalin, risperidone, rizatriptan, sucralfate, trazodone, and clopidogrel. Her primarily concern today is the Migraine   Initial Vital Signs:  Pulse/HCG Rate: (!) 59  Temp: 97.9 F (36.6 C) Resp: 16 BP: 114/69 SpO2: 98 %  BMI: Estimated body mass index is 31.41 kg/m as calculated from the following:   Height as of this encounter: '5\' 4"'$  (1.626 m).   Weight as of this encounter: 183 lb (83 kg).  Risk Assessment: Allergies: Reviewed. She is allergic to methadone, other, and tape.  Allergy Precautions: None required Coagulopathies: Reviewed. None identified.  Blood-thinner therapy: None at this time Active Infection(s): Reviewed. None identified. Lindsay Austin is afebrile  Site Confirmation: Lindsay Austin was asked to confirm the procedure and laterality before marking the site Procedure checklist: Completed Consent: Before the procedure and under the influence of no sedative(s), amnesic(s), or anxiolytics, the patient was informed of the treatment options, risks and possible complications. To fulfill our ethical and legal obligations, as recommended by the American Medical Association's Code of Ethics, I have informed the patient of my clinical impression; the nature and purpose of the treatment or procedure; the risks, benefits, and possible complications of the intervention; the alternatives, including doing nothing; the risk(s) and benefit(s) of the alternative treatment(s) or procedure(s); and the risk(s) and benefit(s) of doing nothing. The patient was provided information about the general risks and possible complications associated with the procedure. These may include, but are not limited to: failure to achieve desired goals, infection, bleeding, organ or nerve damage, allergic reactions, paralysis, and death. In addition, the patient was informed of those risks and complications associated to the procedure, such as failure to decrease pain; infection; bleeding; organ or nerve damage with  subsequent damage to sensory, motor, and/or autonomic systems, resulting in permanent pain, numbness,  and/or weakness of one or several areas of the body; allergic reactions; (i.e.: anaphylactic reaction); and/or death. Furthermore, the patient was informed of those risks and complications associated with the medications. These include, but are not limited to: allergic reactions (i.e.: anaphylactic or anaphylactoid reaction(s)); adrenal axis suppression; blood sugar elevation that in diabetics may result in ketoacidosis or comma; water retention that in patients with history of congestive heart failure may result in shortness of breath, pulmonary edema, and decompensation with resultant heart failure; weight gain; swelling or edema; medication-induced neural toxicity; particulate matter embolism and blood vessel occlusion with resultant organ, and/or nervous system infarction; and/or aseptic necrosis of one or more joints. Finally, the patient was informed that Medicine is not an exact science; therefore, there is also the possibility of unforeseen or unpredictable risks and/or possible complications that may result in a catastrophic outcome. The patient indicated having understood very clearly. We have given the patient no guarantees and we have made no promises. Enough time was given to the patient to ask questions, all of which were answered to the patient's satisfaction. Lindsay Austin has indicated that she wanted to continue with the procedure. Attestation: I, the ordering provider, attest that I have discussed with the patient the benefits, risks, side-effects, alternatives, likelihood of achieving goals, and potential problems during recovery for the procedure that I have provided informed consent. Date  Time: 11/07/2021 11:04 AM  Pre-Procedure Preparation:  Monitoring: As per clinic protocol. Respiration, ETCO2, SpO2, BP, heart rate and rhythm monitor placed and checked for adequate function Safety  Precautions: Patient was assessed for positional comfort and pressure points before starting the procedure. Time-out: I initiated and conducted the "Time-out" before starting the procedure, as per protocol. The patient was asked to participate by confirming the accuracy of the "Time Out" information. Verification of the correct person, site, and procedure were performed and confirmed by me, the nursing staff, and the patient. "Time-out" conducted as per Joint Commission's Universal Protocol (UP.01.01.01). Time: 1153  Constitution and injection details as below:   1 x 200 unit vial of Botox brand onabotulinum toxin A was diluted with 4 mL preservative free saline, for a final concentration of 5 units/0.1 mL. Injection sites were prepared for injection with alcohol swab  Botox was injected as follows:  5 units left corrugator 5 units right corrugator 5 units procerus  5 units in 2 sites, right frontalis (10 units total) 5units in 2 sites, left frontalis (10 units total) 5 units in each of 4 sites right temporalis (20 units total) 5 units in each of 4 sites left temporalis (20 units total) 5 units in each of 3 sites right occipitalis (15 units total) 5 units in each of 3 sites left occipitalis (15 units total) 5 units in each of 2 sites right cervical paraspinal at approximate C3-4 level (10 units total) 5 units in each of 2 sites left cervical paraspinal muscles at approximate C3-4 level (10 units total) 5 units in each of 3 sites right trapezius (15 units total) 5 units in each of 3 sites left trapezius (15 units total)  45 units were wasted  A total of 155 units was administered by Dr Holley Raring        Post-operative Assessment:  Post-procedure Vital Signs:  Pulse/HCG Rate: (!) 59  Temp: 97.9 F (36.6 C) Resp: 16 BP: 114/69 SpO2: 98 %  EBL: None  Complications: No immediate post-treatment complications observed by team, or reported by patient.  Note: The patient tolerated  the entire procedure well. A repeat set of vitals were taken after the procedure and the patient was kept under observation following institutional policy, for this type of procedure. Post-procedural neurological assessment was performed, showing return to baseline, prior to discharge. The patient was provided with post-procedure discharge instructions, including a section on how to identify potential problems. Should any problems arise concerning this procedure, the patient was given instructions to immediately contact us, at any time, without hesitation. In any case, we plan to contact the patient by telephone for a follow-up status report regarding this interventional procedure.  Comments:  No additional relevant information.  Plan of Care   Chronic Opioid Analgesic:   fentanyl patch  50 mcg an hour; 120 MME.   Medications ordered for procedure: Meds ordered this encounter  Medications   Botulinum Toxin Type A SOLR 155 Units   Medications administered: We administered Botulinum Toxin Type A.  See the medical record for exact dosing, route, and time of administration.  Follow-up plan:   Return in about 6 weeks (around 12/19/2021) for Post Procedure Evaluation, virtual.     Recent Visits Date Type Provider Dept  11/01/21 Office Visit [name], MD [facility]  08/21/21 Office Visit [name], MD [facility]  Showing recent visits within past 90 days and meeting all other requirements Today's Visits Date Type Provider Dept  11/07/21 Procedure visit [name], MD [facility]  Showing today's visits and meeting all other requirements Future Appointments Date Type Provider Dept  12/19/21 Appointment [name], MD [facility]  02/05/22 Appointment [name], MD [facility]  Showing future appointments within next 90 days and meeting all other requirements  Disposition: Discharge home  Discharge (Date  Time):  11/07/2021; 1205 hrs.   Primary Care Physician: [name], [place] Location: [facility] Note by: [name], MD Date: 11/07/2021; Time: 1:15 PM  Disclaimer:  Medicine is not an exact science. The only guarantee in medicine is that nothing is guaranteed. It is important to note that the decision to proceed with this intervention was based on the information collected from the patient. The Data and conclusions were drawn from the patient's questionnaire, the interview, and the physical examination. Because the information was provided in large part by the patient, it cannot be guaranteed that it has not been purposely or unconsciously manipulated. Every effort has been made to obtain as much relevant data as possible for this evaluation. It is important to note that the conclusions that lead to this procedure are derived in large part from the available data. Always take into account that the treatment will also be dependent on availability of resources and existing treatment guidelines, considered by other Pain Management Practitioners as being common knowledge and practice, at the time of the intervention. For Medico-Legal purposes, it is also important to point out that variation in procedural techniques and pharmacological choices are the acceptable norm. The indications, contraindications, technique, and results of the above procedure should only be interpreted and judged by a Board-Certified Interventional Pain Specialist with extensive familiarity and expertise in the same exact procedure and technique.

## 2021-11-07 NOTE — Progress Notes (Signed)
Safety precautions to be maintained throughout the outpatient stay will include: orient to surroundings, keep bed in low position, maintain call bell within reach at all times, provide assistance with transfer out of bed and ambulation.  

## 2021-11-08 ENCOUNTER — Telehealth: Payer: Self-pay | Admitting: *Deleted

## 2021-11-08 NOTE — Telephone Encounter (Signed)
Post procedure call;  voicemail left.  

## 2021-12-04 ENCOUNTER — Ambulatory Visit
Admission: RE | Admit: 2021-12-04 | Discharge: 2021-12-04 | Disposition: A | Discharge status: Home / Self Care | Payer: Medicaid Other | Source: Ambulatory Visit | Attending: Family Medicine | Admitting: Family Medicine

## 2021-12-04 DIAGNOSIS — Z1231 Encounter for screening mammogram for malignant neoplasm of breast: Secondary | ICD-10-CM | POA: Diagnosis present

## 2021-12-19 ENCOUNTER — Ambulatory Visit
Payer: Medicaid Other | Attending: Student in an Organized Health Care Education/Training Program | Admitting: Student in an Organized Health Care Education/Training Program

## 2021-12-19 ENCOUNTER — Encounter: Payer: Self-pay | Admitting: Student in an Organized Health Care Education/Training Program

## 2021-12-19 DIAGNOSIS — G43709 Chronic migraine without aura, not intractable, without status migrainosus: Secondary | ICD-10-CM | POA: Diagnosis not present

## 2021-12-19 NOTE — Progress Notes (Signed)
Name is Lindsay Austin: Lindsay Austin  Service Category: E/M  Provider: [name], MD  DOB: 30-Aug-1960  DOS: 12/19/2021  Location: Office  MRN: [id number]  Setting: Ambulatory outpatient  Referring Provider: [name], PA  Type: Established Austin  Specialty: Interventional Pain Management  PCP: [name], PA  Location: Remote location  Delivery: TeleHealth     Virtual Encounter - Pain Management PROVIDER NOTE: Information contained herein reflects review and annotations entered in association with encounter. Interpretation of such information and data should be left to medically-trained personnel. Information provided to Austin can be located elsewhere in the medical record under "Austin Instructions". Document created using STT-dictation technology, any transcriptional errors that may result from process are unintentional.    Contact & Pharmacy Preferred: [phone] Home: [phone] (home) Mobile: [phone] (mobile) E-mail: [email or web address]  [name] [address]Phone: [phone]: [phone]  Pre-screening  [name] "in-person" vs "virtual" encounter. Lindsay Austin indicated preferring virtual for this encounter.   Reason COVID-19*  Social distancing based on CDC and AMA recommendations.   I contacted Lindsay Austin 12/19/2021 via telephone.      I clearly identified myself as BGillis Santa MD. I verified that I was speaking with the correct person using two identifiers (Name: Lindsay Austin and date of birth: [birth year].  Consent I sought verbal advanced consent from Lindsay Austin virtual visit interactions. I informed Ms. KTwiggsof possible security and privacy concerns, risks, and limitations associated with providing "not-in-person" medical evaluation and management services. I also informed Ms. KMinkof the availability of "in-person" appointments. Finally, I informed her that  there would be a charge for the virtual visit and that Lindsay Austin could be  personally, fully or partially, financially responsible for it. Ms. KDeeleyexpressed understanding and agreed to proceed.   Historic Elements   Ms. Lindsay Kohlmanis a 61y.o. year old, female Austin evaluated today after our last contact on 11/07/2021. Lindsay Austin has a past medical history of Anxiety, Arthritis, [place]adder [place] ([place], [place], DDD (degenerative disc disease), cervical, Depression, Failed back syndrome, Fibromyalgia, GERD (gastroesophageal reflux disease), Headache, Heart murmur, Hyperlipemia, Hypertension, IBS (irritable bowel syndrome), Kidney failure, Renal insufficiency, RSD (reflex sympathetic dystrophy), Seizure ([place], Stomach ulcer, and Trigeminal neuralgia. 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 Ms. KSobeckihas a current medication list which includes the following prescription(s): albuterol, bupropion, clopidogrel, dicyclomine, fentanyl, [START ON 01/11/2022] fentanyl, methocarbamol, naloxone, ondansetron, pregabalin, risperidone, rizatriptan, sucralfate, and trazodone. Lindsay Austin  reports that Lindsay Austin has never smoked. Lindsay Austin has never used smokeless tobacco. Lindsay Austin reports that Lindsay Austin does not drink alcohol and does not use drugs. Ms. KMishis allergic to methadone, other, and tape.   HPI  Today, Lindsay Austin is being contacted for a post-procedure assessment.   Post-procedure evaluation    Procedure:            Botox for migraine management #3 (#1 done 02/14/2021, #2 07/23/21)                 1. Chronic migraine without aura  without status migrainosus, not intractable     Pain Score: Pre-procedure: 2 /10 Post-procedure: 2 /  10      Effectiveness:  Initial hour after procedure: 0 %  Subsequent 4-6 hours post-procedure: 0 %  Analgesia past initial 6 hours: 100 % (Austin reports that Lindsay Austin had 3 migraines over the course of 2 weeks and then they have subsided and Lindsay Austin has not experienced any more since then.)  Ongoing improvement: Analgesic:  80 %     Pharmacotherapy Assessment   Opioid Analgesic:  fentanyl patch  50 mcg an hour; 120 MME.   Monitoring: [place] PMP: PDMP reviewed during this encounter.       Pharmacotherapy: No side-effects or adverse reactions reported. Compliance: No problems identified. Effectiveness: Clinically acceptable. Plan: Refer to "POC". UDS:  Summary  Date Value Ref Range Status  04/18/2021 Note  Final    Comment:    ==================================================================== ToxASSURE Select 13 (MW) ==================================================================== Test                             Result       Flag       Units  Drug Present and Declared for Prescription Verification   Fentanyl                       62           EXPECTED   ng/mg creat   Norfentanyl                    257          EXPECTED   ng/mg creat    Source of fentanyl is a scheduled prescription medication, including    IV, patch, and transmucosal formulations. Norfentanyl is an expected    metabolite of fentanyl.  ==================================================================== Test                      Result    Flag   Units      Ref Range   Creatinine              190              mg/dL      >=20 ==================================================================== Declared Medications:  The flagging and interpretation on this report are based on the  following declared medications.  Unexpected results may arise from  inaccuracies in the declared medications.   **Note: The  testing scope of this panel includes these medications:   Fentanyl (Duragesic)   **Note: The testing scope of this panel does not include the  following reported medications:   Albuterol  Bupropion (Wellbutrin SR)  Clopidogrel (Plavix)  Dicyclomine (Bentyl)  Escitalopram (Lexapro)  Galcanezumab (Emgality)  Methocarbamol (Robaxin)  Multivitamin  Naloxone (Narcan)  Olmesartan (Benicar)  Ondansetron (Zofran)  Pregabalin (Lyrica)  Rizatriptan (Maxalt)  Sucralfate (Carafate)  Trazodone (Desyrel)  Triamcinolone (Kenalog) ==================================================================== For clinical consultation, please call [phone]. ====================================================================    No results found for: "CBDTHCR", "D8THCCBX", "D9THCCBX"   Laboratory Chemistry Profile   Renal Lab Results  Component Value Date   BUN 13 12/29/2020   CREATININE 1.44 (H) 12/29/2020   LABCREA 71 08/29/2019   GFRAA 51 (L) 08/31/2019   GFRNONAA 42 (L) 12/29/2020    Hepatic Lab Results  Component Value Date   AST 32 08/29/2019   ALT 12 08/29/2019   ALBUMIN 4.1 08/29/2019   ALKPHOS 66 08/29/2019    Electrolytes Lab Results  Component Value Date   NA 139 12/29/2020  K 4.3 12/29/2020   CL 101 12/29/2020   CALCIUM 9.2 12/29/2020   MG 2.2 08/31/2019    Bone No results found for: "VD25OH", "VD125OH2TOT", "IT2549IY6", "EB5830NM0", "25OHVITD1", "25OHVITD2", "25OHVITD3", "TESTOFREE", "TESTOSTERONE"  Inflammation (CRP: Acute Phase) (ESR: Chronic Phase) No results found for: "CRP", "ESRSEDRATE", "LATICACIDVEN"       Note: Above Lab results reviewed.  Imaging  MM 3D SCREEN BREAST BILATERAL CLINICAL DATA:  Screening.  EXAM: DIGITAL SCREENING BILATERAL MAMMOGRAM WITH TOMOSYNTHESIS AND CAD  TECHNIQUE: Bilateral screening digital craniocaudal and mediolateral oblique mammograms were obtained. Bilateral screening digital breast tomosynthesis was performed.  The images were evaluated with computer-aided detection.  COMPARISON:  Previous exam(s).  ACR Breast Density Category b: There are scattered areas of fibroglandular density.  FINDINGS: There are no findings suspicious for malignancy.  IMPRESSION: No mammographic evidence of malignancy. A result letter of this screening mammogram will be mailed directly to the Austin.  RECOMMENDATION: Screening mammogram in one year. (Code:[id number]-B-01Y)  BI-RADS CATEGORY  1: Negative.  Electronically Signed   By: Lillia Mountain M.D.   On: 12/05/2021 10:33  Assessment  The encounter diagnosis was Chronic migraine without aura without status migrainosus, not intractable.  Plan of Care   Delayed onset analgesic relief of migraines status post Botox.  Lindsay Austin states that Lindsay Austin usually gets immediate pain relief and notices a reduction in her migraine intensity and frequency almost immediately however after this Botox series, Lindsay Austin states that it took almost 2 weeks for her to notice a reduction in her migraine frequency and intensity.  Lindsay Austin states that Lindsay Austin has not had any migraines over the last 3 weeks.  We will continue to monitor symptoms, follow-up as scheduled for medication management October 10.  Follow-up plan:   Return for Keep sch. appt.      Recent Visits Date Type Provider Dept  11/07/21 Procedure visit [name], MD [facility]  11/01/21 Office Visit [name], MD [facility]  Showing recent visits within past 90 days and meeting all other requirements Today's Visits Date Type Provider Dept  12/19/21 Office Visit [name], MD [facility]  Showing today's visits and meeting all other requirements Future Appointments Date Type Provider Dept  02/05/22 Appointment [name], MD [facility]  Showing future appointments within next 90 days and meeting all other requirements  I discussed the assessment and treatment plan with the Austin. The  Austin was provided an opportunity to ask questions and all were answered. The Austin agreed with the plan and demonstrated an understanding of the instructions.  Austin advised to call back or seek an in-person evaluation if the symptoms or condition worsens.  Duration of encounter: 18mnutes.  Note by: BGillis Santa MD Date: 12/19/2021; Time: 2:56 PM

## 2022-02-05 ENCOUNTER — Encounter: Payer: Self-pay | Admitting: Student in an Organized Health Care Education/Training Program

## 2022-02-05 ENCOUNTER — Ambulatory Visit
Payer: Medicaid Other | Attending: Student in an Organized Health Care Education/Training Program | Admitting: Student in an Organized Health Care Education/Training Program

## 2022-02-05 VITALS — BP 117/83 | HR 77 | Temp 97.1°F | Ht 64.0 in | Wt 183.0 lb

## 2022-02-05 DIAGNOSIS — Q761 Klippel-Feil syndrome: Secondary | ICD-10-CM | POA: Insufficient documentation

## 2022-02-05 DIAGNOSIS — M4802 Spinal stenosis, cervical region: Secondary | ICD-10-CM

## 2022-02-05 DIAGNOSIS — Z981 Arthrodesis status: Secondary | ICD-10-CM | POA: Insufficient documentation

## 2022-02-05 DIAGNOSIS — G894 Chronic pain syndrome: Secondary | ICD-10-CM | POA: Diagnosis present

## 2022-02-05 DIAGNOSIS — S7001XS Contusion of right hip, sequela: Secondary | ICD-10-CM | POA: Insufficient documentation

## 2022-02-05 DIAGNOSIS — T85192S Other mechanical complication of implanted electronic neurostimulator (electrode) of spinal cord, sequela: Secondary | ICD-10-CM | POA: Diagnosis present

## 2022-02-05 DIAGNOSIS — M533 Sacrococcygeal disorders, not elsewhere classified: Secondary | ICD-10-CM

## 2022-02-05 DIAGNOSIS — G5701 Lesion of sciatic nerve, right lower limb: Secondary | ICD-10-CM | POA: Insufficient documentation

## 2022-02-05 DIAGNOSIS — M47816 Spondylosis without myelopathy or radiculopathy, lumbar region: Secondary | ICD-10-CM | POA: Diagnosis present

## 2022-02-05 DIAGNOSIS — G43709 Chronic migraine without aura, not intractable, without status migrainosus: Secondary | ICD-10-CM | POA: Diagnosis not present

## 2022-02-05 DIAGNOSIS — G9050 Complex regional pain syndrome I, unspecified: Secondary | ICD-10-CM

## 2022-02-05 DIAGNOSIS — G5703 Lesion of sciatic nerve, bilateral lower limbs: Secondary | ICD-10-CM

## 2022-02-05 DIAGNOSIS — M47812 Spondylosis without myelopathy or radiculopathy, cervical region: Secondary | ICD-10-CM | POA: Diagnosis present

## 2022-02-05 MED ORDER — FENTANYL 50 MCG/HR TD PT72: 1.0000 | MEDICATED_PATCH | TRANSDERMAL | 0 refills | Status: DC

## 2022-02-05 MED ORDER — FENTANYL 50 MCG/HR TD PT72: 1.0000 | MEDICATED_PATCH | TRANSDERMAL | 0 refills | Status: AC

## 2022-02-05 NOTE — Progress Notes (Signed)
Nursing Pain Medication Assessment:  Safety precautions to be maintained throughout the outpatient stay will include: orient to surroundings, keep bed in low position, maintain call bell within reach at all times, provide assistance with transfer out of bed and ambulation.  Medication Inspection Compliance: Pill count conducted under aseptic conditions, in front of the patient. Neither the pills nor the bottle was removed from the patient's sight at any time. Once count was completed pills were immediately returned to the patient in their original bottle.  Medication: Fentanyl patch Pill/Patch Count:  1 of 10 pills remain Pill/Patch Appearance: Markings consistent with prescribed medication Bottle Appearance: Standard pharmacy container. Clearly labeled. Filled Date: 09 / 14 / 2023 Last Medication intake:  Today

## 2022-02-05 NOTE — Progress Notes (Signed)
PROVIDER NOTE: Information contained herein reflects review and annotations entered in association with encounter. Interpretation of such information and data should be left to medically-trained personnel. Information provided to patient can be located elsewhere in the medical record under "Patient Instructions". Document created using STT-dictation technology, any transcriptional errors that may result from process are unintentional.    Patient: Lindsay Austin  Service Category: E/M  Provider: [name], MD  DOB: [birth year]  DOS: 02/05/2022  Specialty: Interventional Pain Management  MRN: [id number]  Setting: Ambulatory outpatient  PCP: Lindsay Plumber, PA  Type: Established Patient    Referring Provider: [name], PA  Location: Office  Delivery: Face-to-face     HPI  Lindsay Austin, a 61 y.o. year old female, is here today because of her Sacroiliac joint dysfunction of right side [M53.3]. [name] primary complain today is Back Pain (low)  Last encounter: My last encounter with her was on 12/19/21  Pertinent problems: [name] has Hx of lumbosacral spine surgery; History of lumbar fusion (L5-S1 fusion); Failed spinal cord stimulator, sequela; Cervical fusion syndrome (C4-C7); Chronic pain syndrome; Major depressive disorder with current active episode; Lumbar spondylosis; Cervical spondylosis without myelopathy; and Spinal stenosis in cervical region on their pertinent problem list. Pain Assessment: Severity of Chronic pain is reported as a 4 /10. Location: Back Lower/radiates down both legs to feet bilaterally. Onset: More than a month ago. Quality: Nagging, Shooting. Timing: Constant. Modifying factor(s): resting and propping up feet. Vitals:  height is 5' 4" (1.626 m) and weight is 183 lb (83 kg). Her temperature is 97.1 F (36.2 C) (abnormal). Her blood pressure is 117/83 and her pulse is 77. Her oxygen saturation is 94%.   Reason for encounter: medication management.    Patient  presents today for medication management.  No significant change in medical history.  Lindsay Austin is having increased migraines and would like to get scheduled for Botox.  Previous Botox treatment was 11/07/2021.  She is also endorsing increased bilateral SI joint pain and piriformis pain.  Previous SI joint and piriformis injection was July 04, 2021 that provided her with 65% pain relief for approximately 6 months.  Otherwise we will refill fentanyl patch as below.  No change in dose.  UDS up-to-date and appropriate.    Pharmacotherapy Assessment  Analgesic:  fentanyl patch  50 mcg an hour; 120 MME.   Monitoring: [place] PMP: PDMP reviewed during this encounter.       Pharmacotherapy: No side-effects or adverse reactions reported. Compliance: No problems identified. Effectiveness: Clinically acceptable.  UDS:  Summary  Date Value Ref Range Status  04/18/2021 Note  Final    Comment:    ==================================================================== [employer] Select 13 (MW) ==================================================================== Test                             Result       Flag       Units  Drug Present and Declared for Prescription Verification   Fentanyl                       62           EXPECTED   ng/mg creat   Norfentanyl                    257          EXPECTED   ng/mg creat    Source of fentanyl is  a scheduled prescription medication, including    IV, patch, and transmucosal formulations. Norfentanyl is an expected    metabolite of fentanyl.  ==================================================================== Test                      Result    Flag   Units      Ref Range   Creatinine              190              mg/dL      >=20 ==================================================================== Declared Medications:  The flagging and interpretation on this report are based on the  following declared medications.  Unexpected results may arise from  inaccuracies  in the declared medications.   **Note: The testing scope of this panel includes these medications:   Fentanyl (Duragesic)   **Note: The testing scope of this panel does not include the  following reported medications:   Albuterol  Bupropion (Wellbutrin SR)  Clopidogrel (Plavix)  Dicyclomine (Bentyl)  Escitalopram (Lexapro)  Galcanezumab (Emgality)  Methocarbamol (Robaxin)  Multivitamin  Naloxone (Narcan)  Olmesartan (Benicar)  Ondansetron (Zofran)  Pregabalin (Lyrica)  Rizatriptan (Maxalt)  Sucralfate (Carafate)  Trazodone (Desyrel)  Triamcinolone (Kenalog) ==================================================================== For clinical consultation, please call [phone]. ====================================================================       ROS  Constitutional: Denies any fever or chills Gastrointestinal: No reported hemesis, hematochezia, vomiting, or acute GI distress Musculoskeletal:  Low back, bilateral hip, right greater than left SI joint and groin pain Neurological: No reported episodes of acute onset apraxia, aphasia, dysarthria, agnosia, amnesia, paralysis, loss of coordination, or loss of consciousness  Medication Review  albuterol, buPROPion, clopidogrel, dicyclomine, fentaNYL, methocarbamol, naloxone, ondansetron, pregabalin, risperiDONE, rizatriptan, sucralfate, and traZODone  History Review  Allergy: [name] is allergic to methadone, other, and tape. Drug: [name]  reports no history of drug use. Alcohol:  reports no history of alcohol use. Tobacco:  reports that she has never smoked. She has never used smokeless tobacco. Social: [name]  reports that she has never smoked. She has never used smokeless tobacco. She reports that she does not drink alcohol and does not use drugs. Medical:  has a past medical history of Anxiety, Arthritis, Bladder cancer ([place]), Cancer of kidney ([place]), DDD (degenerative disc disease), cervical, Depression, Failed  back syndrome, Fibromyalgia, GERD (gastroesophageal reflux disease), Headache, Heart murmur, Hyperlipemia, Hypertension, IBS (irritable bowel syndrome), Kidney failure, Renal insufficiency, RSD (reflex sympathetic dystrophy), Seizure ([place]), Stomach ulcer, and Trigeminal neuralgia. Surgical: Ms. Detjen  has a past surgical history that includes Transurethral resection of bladder tumor with gyrus (turbt-gyrus) (2012, 2013); Nephroureterectomy (2011); Back surgery (1995); Cholecystectomy; Brain surgery (2004); trigeminal neuralgia (2004); Spinal cord stimulator implant (2004); colonscopy (2006); Posterior cervical fusion/foraminotomy (Bilateral, 07/05/2019); Posterior cervical laminectomy (Bilateral, 07/05/2019); and Breast biopsy (Left, 2017). Family: family history includes Dementia in her father; Diabetes in her mother; Heart disease in her mother; Kidney disease in her father.  Laboratory Chemistry Profile   Renal Lab Results  Component Value Date   BUN 13 12/29/2020   CREATININE 1.44 (H) 12/29/2020   LABCREA 71 08/29/2019   GFRAA 51 (L) 08/31/2019   GFRNONAA 42 (L) 12/29/2020     Hepatic Lab Results  Component Value Date   AST 32 08/29/2019   ALT 12 08/29/2019   ALBUMIN 4.1 08/29/2019   ALKPHOS 66 08/29/2019     Electrolytes Lab Results  Component Value Date   NA 139 12/29/2020   K 4.3 12/29/2020  CL 101 12/29/2020   CALCIUM 9.2 12/29/2020   MG 2.2 08/31/2019     Bone No results found for: "VD25OH", "VD125OH2TOT", "QH4765YY5", "KP5465KC1", "25OHVITD1", "25OHVITD2", "25OHVITD3", "TESTOFREE", "TESTOSTERONE"   Inflammation (CRP: Acute Phase) (ESR: Chronic Phase) No results found for: "CRP", "ESRSEDRATE", "LATICACIDVEN"     Note: Above Lab results reviewed.   Physical Exam  General appearance: Well nourished, well developed, and well hydrated. In no apparent acute distress Mental status: Alert, oriented x 3 (person, place, & time)       Respiratory: No evidence of acute  respiratory distress Eyes: PERLA Vitals: BP 117/83 (BP Location: Left Arm, Patient Position: Sitting)   Pulse 77   Temp (!) 97.1 F (36.2 C)   Ht 5' 4" (1.626 m)   Wt 183 lb (83 kg)   SpO2 94%   BMI 31.41 kg/m  BMI: Estimated body mass index is 31.41 kg/m as calculated from the following:   Height as of this encounter: 5' 4" (1.626 m).   Weight as of this encounter: 183 lb (83 kg). Ideal: Ideal body weight: 54.7 kg (120 lb 9.5 oz) Adjusted ideal body weight: 66 kg (145 lb 8.9 oz)  Lumbar Spine Area Exam  Skin & Axial Inspection: Well healed scar from previous spine surgery detected Alignment: Symmetrical Functional ROM: Pain restricted ROM affecting primarily the right Stability: No instability detected Muscle Tone/Strength: Functionally intact. No obvious neuro-muscular anomalies detected. Sensory (Neurological): Musculoskeletal pain pattern Palpation: No palpable anomalies       Provocative Tests: Hyperextension/rotation test: deferred today       Lumbar quadrant test (Kemp's test): deferred today       Lateral bending test: (+) due to pain. Patrick's Maneuver: (+) for right-sided S-I arthralgia greater than left             FABER* test: (+) for right-sided S-I arthralgia greater than left        S-I anterior distraction/compression test: (+) Right-sided greater than left       S-I lateral compression test: (+)   S-I arthralgia/arthropathy greater than left  S-I Thigh-thrust test: (+)   S-I arthralgia/arthropathy greater than left  S-I Gaenslen's test: deferred today         *(Flexion, ABduction and External Rotation)   Gait & Posture Assessment  Ambulation: antalgic Gait: Limited. Using assistive device to ambulate Posture: Difficulty standing up straight, due to pain    Lower Extremity Exam      Side: Right lower extremity   Side: Left lower extremity  Stability: No instability observed           Stability: No instability observed          Skin & Extremity  Inspection: Skin color, temperature, and hair growth are WNL. No peripheral edema or cyanosis. No masses, redness, swelling, asymmetry, or associated skin lesions. No contractures.   Skin & Extremity Inspection: Skin color, temperature, and hair growth are WNL. No peripheral edema or cyanosis. No masses, redness, swelling, asymmetry, or associated skin lesions. No contractures.  Functional ROM: Pain restricted ROM for all joints of the lower extremity           Functional ROM: Pain restricted ROM for all joints of the lower extremity          Muscle Tone/Strength: Functionally intact. No obvious neuro-muscular anomalies detected.   Muscle Tone/Strength: Functionally intact. No obvious neuro-muscular anomalies detected.  Sensory (Neurological): Unimpaired         Sensory (Neurological): Unimpaired  DTR: Patellar: deferred today Achilles: deferred today Plantar: deferred today   DTR: Patellar: deferred today Achilles: deferred today Plantar: deferred today  Palpation: No palpable anomalies   Palpation: No palpable anomalies       Assessment   Status Diagnosis  Having a Flare-up Having a Flare-up Having a Flare-up 1. Sacroiliac joint dysfunction of right side   2. Piriformis syndrome of both sides   3. Sacroiliac joint dysfunction of left side   4. Chronic migraine without aura without status migrainosus, not intractable   5. History of lumbar fusion   6. Failed spinal cord stimulator, sequela   7. Chronic pain syndrome   8. Lumbar spondylosis   9. RSD (reflex sympathetic dystrophy)   10. Cervical fusion syndrome (C4-T2)   11. Piriformis syndrome of right side   12. Spinal stenosis in cervical region   13. Lumbar facet arthropathy   14. Cervical spondylosis without myelopathy   15. Contusion of right hip, sequela   16. Cervical facet joint syndrome       Plan of Care  Ms. Sae Handrich has a current medication list which includes the following long-term medication(s):  albuterol, bupropion, dicyclomine, risperidone, rizatriptan, sucralfate, and trazodone.  1. Piriformis syndrome of both sides - TRIGGER POINT INJECTION; Future  2. Sacroiliac joint dysfunction of right side - SACROILIAC JOINT INJECTION; Future - fentaNYL (DURAGESIC) 50 MCG/HR; Place 1 patch onto the skin every 3 (three) days. Most last 30 days.  Dispense: 10 patch; Refill: 0 - fentaNYL (DURAGESIC) 50 MCG/HR; Place 1 patch onto the skin every 3 (three) days. Most last 30 days.  Dispense: 10 patch; Refill: 0 - fentaNYL (DURAGESIC) 50 MCG/HR; Place 1 patch onto the skin every 3 (three) days. Most last 30 days.  Dispense: 10 patch; Refill: 0  3. Sacroiliac joint dysfunction of left side - SACROILIAC JOINT INJECTION; Future  4. Chronic migraine without aura without status migrainosus, not intractable - Chemo Denervation; Future  5. History of lumbar fusion - fentaNYL (DURAGESIC) 50 MCG/HR; Place 1 patch onto the skin every 3 (three) days. Most last 30 days.  Dispense: 10 patch; Refill: 0 - fentaNYL (DURAGESIC) 50 MCG/HR; Place 1 patch onto the skin every 3 (three) days. Most last 30 days.  Dispense: 10 patch; Refill: 0 - fentaNYL (DURAGESIC) 50 MCG/HR; Place 1 patch onto the skin every 3 (three) days. Most last 30 days.  Dispense: 10 patch; Refill: 0  6. Failed spinal cord stimulator, sequela - fentaNYL (DURAGESIC) 50 MCG/HR; Place 1 patch onto the skin every 3 (three) days. Most last 30 days.  Dispense: 10 patch; Refill: 0 - fentaNYL (DURAGESIC) 50 MCG/HR; Place 1 patch onto the skin every 3 (three) days. Most last 30 days.  Dispense: 10 patch; Refill: 0 - fentaNYL (DURAGESIC) 50 MCG/HR; Place 1 patch onto the skin every 3 (three) days. Most last 30 days.  Dispense: 10 patch; Refill: 0  7. Chronic pain syndrome - Chemo Denervation; Future - SACROILIAC JOINT INJECTION; Future - TRIGGER POINT INJECTION; Future - fentaNYL (DURAGESIC) 50 MCG/HR; Place 1 patch onto the skin every 3 (three)  days. Most last 30 days.  Dispense: 10 patch; Refill: 0 - fentaNYL (DURAGESIC) 50 MCG/HR; Place 1 patch onto the skin every 3 (three) days. Most last 30 days.  Dispense: 10 patch; Refill: 0 - fentaNYL (DURAGESIC) 50 MCG/HR; Place 1 patch onto the skin every 3 (three) days. Most last 30 days.  Dispense: 10 patch; Refill: 0  8. Lumbar spondylosis - fentaNYL (DURAGESIC)  50 MCG/HR; Place 1 patch onto the skin every 3 (three) days. Most last 30 days.  Dispense: 10 patch; Refill: 0 - fentaNYL (DURAGESIC) 50 MCG/HR; Place 1 patch onto the skin every 3 (three) days. Most last 30 days.  Dispense: 10 patch; Refill: 0 - fentaNYL (DURAGESIC) 50 MCG/HR; Place 1 patch onto the skin every 3 (three) days. Most last 30 days.  Dispense: 10 patch; Refill: 0  9. RSD (reflex sympathetic dystrophy) - fentaNYL (DURAGESIC) 50 MCG/HR; Place 1 patch onto the skin every 3 (three) days. Most last 30 days.  Dispense: 10 patch; Refill: 0 - fentaNYL (DURAGESIC) 50 MCG/HR; Place 1 patch onto the skin every 3 (three) days. Most last 30 days.  Dispense: 10 patch; Refill: 0 - fentaNYL (DURAGESIC) 50 MCG/HR; Place 1 patch onto the skin every 3 (three) days. Most last 30 days.  Dispense: 10 patch; Refill: 0  10. Cervical fusion syndrome (C4-T2) - fentaNYL (DURAGESIC) 50 MCG/HR; Place 1 patch onto the skin every 3 (three) days. Most last 30 days.  Dispense: 10 patch; Refill: 0 - fentaNYL (DURAGESIC) 50 MCG/HR; Place 1 patch onto the skin every 3 (three) days. Most last 30 days.  Dispense: 10 patch; Refill: 0 - fentaNYL (DURAGESIC) 50 MCG/HR; Place 1 patch onto the skin every 3 (three) days. Most last 30 days.  Dispense: 10 patch; Refill: 0  11. Piriformis syndrome of right side - fentaNYL (DURAGESIC) 50 MCG/HR; Place 1 patch onto the skin every 3 (three) days. Most last 30 days.  Dispense: 10 patch; Refill: 0 - fentaNYL (DURAGESIC) 50 MCG/HR; Place 1 patch onto the skin every 3 (three) days. Most last 30 days.  Dispense: 10 patch;  Refill: 0 - fentaNYL (DURAGESIC) 50 MCG/HR; Place 1 patch onto the skin every 3 (three) days. Most last 30 days.  Dispense: 10 patch; Refill: 0  12. Spinal stenosis in cervical region - fentaNYL (DURAGESIC) 50 MCG/HR; Place 1 patch onto the skin every 3 (three) days. Most last 30 days.  Dispense: 10 patch; Refill: 0 - fentaNYL (DURAGESIC) 50 MCG/HR; Place 1 patch onto the skin every 3 (three) days. Most last 30 days.  Dispense: 10 patch; Refill: 0 - fentaNYL (DURAGESIC) 50 MCG/HR; Place 1 patch onto the skin every 3 (three) days. Most last 30 days.  Dispense: 10 patch; Refill: 0  13. Lumbar facet arthropathy - fentaNYL (DURAGESIC) 50 MCG/HR; Place 1 patch onto the skin every 3 (three) days. Most last 30 days.  Dispense: 10 patch; Refill: 0 - fentaNYL (DURAGESIC) 50 MCG/HR; Place 1 patch onto the skin every 3 (three) days. Most last 30 days.  Dispense: 10 patch; Refill: 0 - fentaNYL (DURAGESIC) 50 MCG/HR; Place 1 patch onto the skin every 3 (three) days. Most last 30 days.  Dispense: 10 patch; Refill: 0  14. Cervical spondylosis without myelopathy - fentaNYL (DURAGESIC) 50 MCG/HR; Place 1 patch onto the skin every 3 (three) days. Most last 30 days.  Dispense: 10 patch; Refill: 0 - fentaNYL (DURAGESIC) 50 MCG/HR; Place 1 patch onto the skin every 3 (three) days. Most last 30 days.  Dispense: 10 patch; Refill: 0 - fentaNYL (DURAGESIC) 50 MCG/HR; Place 1 patch onto the skin every 3 (three) days. Most last 30 days.  Dispense: 10 patch; Refill: 0  15. Contusion of right hip, sequela - fentaNYL (DURAGESIC) 50 MCG/HR; Place 1 patch onto the skin every 3 (three) days. Most last 30 days.  Dispense: 10 patch; Refill: 0 - fentaNYL (DURAGESIC) 50 MCG/HR; Place 1 patch  onto the skin every 3 (three) days. Most last 30 days.  Dispense: 10 patch; Refill: 0 - fentaNYL (DURAGESIC) 50 MCG/HR; Place 1 patch onto the skin every 3 (three) days. Most last 30 days.  Dispense: 10 patch; Refill: 0  16. Cervical facet  joint syndrome - fentaNYL (DURAGESIC) 50 MCG/HR; Place 1 patch onto the skin every 3 (three) days. Most last 30 days.  Dispense: 10 patch; Refill: 0 - fentaNYL (DURAGESIC) 50 MCG/HR; Place 1 patch onto the skin every 3 (three) days. Most last 30 days.  Dispense: 10 patch; Refill: 0 - fentaNYL (DURAGESIC) 50 MCG/HR; Place 1 patch onto the skin every 3 (three) days. Most last 30 days.  Dispense: 10 patch; Refill: 0     Pharmacotherapy (Medications Ordered): Meds ordered this encounter  Medications   fentaNYL (DURAGESIC) 50 MCG/HR    Sig: Place 1 patch onto the skin every 3 (three) days. Most last 30 days.    Dispense:  10 patch    Refill:  0    [place] STOP ACT - Not applicable. Fill one day early if pharmacy is closed on scheduled refill date.   fentaNYL (DURAGESIC) 50 MCG/HR    Sig: Place 1 patch onto the skin every 3 (three) days. Most last 30 days.    Dispense:  10 patch    Refill:  0    [place] STOP ACT - Not applicable. Fill one day early if pharmacy is closed on scheduled refill date.   fentaNYL (DURAGESIC) 50 MCG/HR    Sig: Place 1 patch onto the skin every 3 (three) days. Most last 30 days.    Dispense:  10 patch    Refill:  0    [place] STOP ACT - Not applicable. Fill one day early if pharmacy is closed on scheduled refill date.   Follow-up for Botox for migraine management  Orders Placed This Encounter  Procedures   SACROILIAC JOINT INJECTION    Standing Status:   Future    Standing Expiration Date:   05/08/2022    Scheduling Instructions:     Side: Bilateral     Sedation: PO Valium     Timeframe: ASAP    Order Specific Question:   Where will this procedure be performed?    Answer:   [facility]   TRIGGER POINT INJECTION    Area: Buttocks region (gluteal area) Indications: Piriformis muscle pain; Bilateral (G57.03) piriformis-syndrome; piriformis muscle spasms (Y81.448). CPT code: 20552    Standing Status:   Future    Standing Expiration Date:   02/06/2023     Scheduling Instructions:     Type: Myoneural block (TPI) of piriformis muscle.     Side:  B/L     Sedation: Patient's choice.     Timeframe: Today    Order Specific Question:   Where will this procedure be performed?    Answer:   [facility]   Chemo Denervation    Standing Status:   Future    Standing Expiration Date:   02/06/2023    Scheduling Instructions:     Botox for migraines   Lyrica managed by neurology: 100 mg TID   Follow-up plan:   Return in about 2 weeks (around 02/19/2022) for Botox and B/L SI-J + Piriformis (block 1 hr), in clinic (PO Valium).   Recent Visits Date Type Provider Dept  12/19/21 Office Visit Lindsay Santa, MD [facility]  11/07/21 Procedure visit Lindsay Santa, MD [facility]  Showing recent visits  within past 90 days and meeting all other requirements Today's Visits Date Type Provider Dept  02/05/22 Office Visit Lindsay Santa, MD [facility]  Showing today's visits and meeting all other requirements Future Appointments No visits were found meeting these conditions. Showing future appointments within next 90 days and meeting all other requirements  I discussed the assessment and treatment plan with the patient. The patient was provided an opportunity to ask questions and all were answered. The patient agreed with the plan and demonstrated an understanding of the instructions.  Patient advised to call back or seek an in-person evaluation if the symptoms or condition worsens.  Duration of encounter: 35 minutes.  Note by: Lindsay Santa, MD Date: 02/05/2022; Time: 11:55 AM

## 2022-02-05 NOTE — Patient Instructions (Addendum)
Do not eat or drink for 8 hours; bring a driver; stop Plavix 7 complete days prior to procedure`Botox  What is Botox (Onabotulinumtoxina)?  Botox (Onabotulinumtoxina), also called botulinum toxin type A, is made from the bacteria that causes botulism.  Botulinum toxin blocks nerve activity in the muscles, causing temporary reduction in muscle activity.  Botox is made from human plasma (part of the blood) which may contain viruses and other infectious agents.  Donated plasma is tested and treated to reduce the risk of it containing infectious agents, but there is still a small possibility that it could transmit disease.  Talk with your doctor about the risk and benefits of using this medication.  FDA pregnancy category C.  It is not known whether botulinum toxin will harm an unborn baby.  Tell your doctor if you are pregnant or plan to become pregnant while using this medication.  It is not known whether botulinum toxin passes into breast milk or if it could harm a nursing baby.  Do not receive this medication if you are breastfeeding without discussing with your doctor.  Botox is used to prevent chronic migraine headaches in adults who have migraines for more than 15 days per month, each lasting 4 hours or longer.  Botox should not be used to treat common tension headache.  To make sure you can safely use Botox, tell your doctor if you have any of these conditions:  Amyotrophic lateral sclerosis (ALS or Lou Gehrig's Disease)  Myasthenia Gravis  Breathing disorders  Lambert-Eaton Syndrome  Problems swallowing  Facial muscle weakness  A change in normal appearance of your face  A seizure disorder  Bleeding problems  Heart disease  If you have had or will have surgery, or if you have received other Botulinum toxin injections in the past 4 months    What are the possible side effects of Botox?  **Call your doctor immediately if you have any of these serious side effects which can  occur up to several weeks after an injection:  Trouble breathing, talking or swallowing  Hoarse voice, drooping eyelids  Unusual or severe muscle weakness (especially in a body area that was not injected  with the medication)  Loss of bladder control  Problems with vision  Crusting or drainage from your eyes  Severe skin rash or itching  Fast, slow, or uneven heartbeats  Chest pain or heavy feeling, pain spreading to the arm or shoulder, general ill feeling  Less side effects may include:  Muscle weakness near where the medicine was injected  Bruising, bleeding, pain, redness or swelling where the injection was given  Headache, muscle stiffness, neck or back pain  Fever, cough, sore throat, runny nose, flu like symptoms  Dizziness, drowsiness, tired feeling  Nausea, diarrhea, stomach pain, loss of appetite  Dry mouth, dry eyes, ringing in ears  Increased sweating in areas other than under the arms  Itchy, watery eyes, increased sensitivity to light  Eyelid swelling or bruising   Pre and Post treatment Instructions  Pre Treatment Instructions: 3 days before treatment, avoid topical facial or "anti aging" products.  Avoid waxing, bleaching, tweezing or the use of hair removal cream. 7 days before treatment, avoid blood thinning over the counter medications such as aspirin, Motrin, Aleve and Ibuprofen.  Also avoid herbal supplements such as Garlic, Vitamin E, Ginkgo Biloba, [address] and Omega-3 capsules. Do not drink alcoholic beverages 24 hours before or after your treatment to avoid bruising. Inform your provider if you  have perioral herpes to receive advice on antiviral therapy prior to treatment.  Day of Treatment: Arrive to office with a "clean face".  Do not wear make-up. You may experience a mild amount of tenderness or a stinging sensation following injection. Redness and swelling are normal.  Some bruising may also be visible. You may experience some  tenderness at the treatment site that can last for a few hours or a few days.  Immediately after treatment: It is best to try to exercise your treated muscles for 1-2 hours after treatment (practice frowning, raising your eyebrows and squinting).  This helps to work the BOTOX into your muscles. Stay in a vertical position for four hours following injections.  Do not "rest your head" or lie down.  Sit upright. You may apply an ice or cold gel pack to the area as this helps reduce swelling and the potential for bruising. Once you have iced the area and any pinpoint bleeding from the injection site has subsided, you may begin wearing makeup. Avoid placing excessive pressure on the treated areas for the first few days; when cleansing your face or applying make-up, be very gentle. Avoid exercise or strenuous activities for the remainder of the treatment day; you may resume other normal activities/routines immediately. You may take Acetaminophen/Tylenol if you experience any mild tenderness or discomfort. Avoid extended UV exposure until any redness/swelling has subsided.  Be sure to apply an SPF 22 or higher sunscreen. Wait a minimum of 24 hours (or as directed by your provider) before receiving any skin care or laser treatment.  Please contact us at [phone] if you have any questions or concerns   Sacroiliac Joint Injection A sacroiliac (SI) joint injection is a procedure to inject a numbing medicine (anesthetic block)--and sometimes a strong anti-inflammatory medicine (steroid)--into the SI joint. The SI joint is the joint between two bones of the pelvis called the sacrum and the ilium. The sacrum is the bone at the base of the spine. The ilium is the large bone that forms the hip. You may need this procedure if you have pain because of an inflamed or diseased SI joint. Various conditions can cause pain in the SI joint, including rheumatoid arthritis, gout, psoriatic arthritis, infection, or  injury. SI joint pain is a common cause of lower back pain. It may also cause pain in your buttocks or leg. SI joint injection may be done to: Find out if an anesthetic block relieves pain. This can confirm that the SI joint is the cause of pain (diagnostic use). Treat a painful SI joint with steroids, anesthetic medicine, or both (therapeutic use). Tell a health care provider about: Any allergies you have. All medicines you are taking, including vitamins, herbs, eye drops, creams, and over-the-counter medicines. Any problems you or family members have had with anesthetic medicines. Any blood disorders you have. Any surgeries you have had. Any medical conditions you have. Whether you are pregnant or may be pregnant. What are the risks? Generally, this is a safe procedure. However, problems may occur, including: Infection. Bleeding. Nerve injury. Temporary increase in pain or failure to relieve pain. Headache. Bruising or soreness at the joint, in deep tissues, or at the injection site. Allergic reactions to medicines or dyes. There may also be side effects from the steroid medicine. These may include facial flushing, increased appetite, diarrhea, and increased blood sugar. What happens before the procedure? Medicines Ask your health care provider about: Changing or stopping your regular medicines. This is especially  important if you are taking diabetes medicines or blood thinners. Taking medicines such as aspirin and ibuprofen. These medicines can thin your blood. Do not take these medicines unless your health care provider tells you to take them. Taking over-the-counter medicines, vitamins, herbs, and supplements. General instructions You may have a physical exam. You may have imaging tests, such as an X-ray, CT scan, or MRI. Follow instructions from your health care provider about eating or drinking restrictions. Ask your health care provider what steps will be taken to help prevent  infection. This may include washing skin with a germ-killing soap. Plan to have a responsible adult take you home from the hospital or clinic. What happens during the procedure?  You will be awake during the procedure and may be given one or more of the following: A medicine to help you relax (sedative). A medicine to numb the area (local anesthetic). Your health care provider will inject a local anesthetic into the skin above your SI joint. You will be placed in the proper position on a procedure table to give the health care team the best access to your SI joint. An X-ray machine that produces moving X-ray images (fluoroscopy) will be placed above the procedure table. A long, thin needle will be inserted through your skin and down to your SI joint. The position of the needle will be checked with fluoroscopy imaging. An X-ray dye will be injected to make sure the needle enters the joint space. You may be asked if you feel any pain. Long-acting anesthetic medicine will be injected. Long-acting steroid medicine may also be injected. The needle will be removed, and a bandage will be placed over the injection site. The procedure may vary among health care providers and hospitals. What happens after the procedure? Your blood pressure, heart rate, breathing rate, and blood oxygen level will be monitored until you leave the hospital or clinic. Since dye was used, you will be told to drink plenty of water to wash (flush) the dye out of your body. You may be asked if you have pain relief from the injection. You will likely be able to go home shortly after the procedure. Your health care provider will give you instructions for taking care of yourself after the procedure. These may include instructions for doing physical therapy exercises. If you were given a sedative during the procedure, it can affect you for several hours. Do not drive or operate machinery until your health care provider says that it is  safe. Summary A sacroiliac (SI) joint injection is a procedure to inject a numbing medicine (anesthetic block)--and sometimes a strong anti-inflammatory medicine (steroid)--into the SI joint. You will be awake during the procedure. You may be given a sedative, a local anesthetic, or both. If you were given a sedative during the procedure, it can affect you for several hours. Do not drive or operate machinery until your health care provider says that it is safe. This information is not intended to replace advice given to you by your health care provider. Make sure you discuss any questions you have with your health care provider. Document Revised: 08/26/2019 Document Reviewed: 08/26/2019 Elsevier Patient Education  [place].

## 2022-03-04 ENCOUNTER — Ambulatory Visit
Payer: Medicaid Other | Attending: Student in an Organized Health Care Education/Training Program | Admitting: Student in an Organized Health Care Education/Training Program

## 2022-03-04 ENCOUNTER — Ambulatory Visit
Admission: RE | Admit: 2022-03-04 | Discharge: 2022-03-04 | Disposition: A | Discharge status: Home / Self Care | Payer: Medicaid Other | Source: Ambulatory Visit | Attending: Student in an Organized Health Care Education/Training Program | Admitting: Student in an Organized Health Care Education/Training Program

## 2022-03-04 VITALS — BP 96/63 | HR 94 | Temp 98.2°F | Resp 12 | Ht 64.0 in | Wt 183.0 lb

## 2022-03-04 DIAGNOSIS — G43709 Chronic migraine without aura, not intractable, without status migrainosus: Secondary | ICD-10-CM

## 2022-03-04 DIAGNOSIS — G5703 Lesion of sciatic nerve, bilateral lower limbs: Secondary | ICD-10-CM | POA: Diagnosis not present

## 2022-03-04 DIAGNOSIS — G894 Chronic pain syndrome: Secondary | ICD-10-CM | POA: Diagnosis not present

## 2022-03-04 DIAGNOSIS — M25559 Pain in unspecified hip: Secondary | ICD-10-CM | POA: Insufficient documentation

## 2022-03-04 DIAGNOSIS — G43809 Other migraine, not intractable, without status migrainosus: Secondary | ICD-10-CM | POA: Insufficient documentation

## 2022-03-04 DIAGNOSIS — Z79899 Other long term (current) drug therapy: Secondary | ICD-10-CM | POA: Diagnosis not present

## 2022-03-04 DIAGNOSIS — M461 Sacroiliitis, not elsewhere classified: Secondary | ICD-10-CM | POA: Diagnosis not present

## 2022-03-04 DIAGNOSIS — M533 Sacrococcygeal disorders, not elsewhere classified: Secondary | ICD-10-CM | POA: Diagnosis not present

## 2022-03-04 MED ORDER — DEXAMETHASONE SODIUM PHOSPHATE 10 MG/ML IJ SOLN
10.0000 mg | Freq: Once | INTRAMUSCULAR | Status: AC
  Administered 2022-03-04: 10 mg

## 2022-03-04 MED ORDER — METHYLPREDNISOLONE ACETATE 80 MG/ML IJ SUSP
INTRAMUSCULAR | Status: AC
  Filled 2022-03-04: qty 1

## 2022-03-04 MED ORDER — ROPIVACAINE HCL 2 MG/ML IJ SOLN
INTRAMUSCULAR | Status: AC
  Filled 2022-03-04: qty 20

## 2022-03-04 MED ORDER — ONABOTULINUMTOXINA 200 UNITS IJ SOLR
155.0000 [IU] | Freq: Once | INTRAMUSCULAR | Status: AC
  Administered 2022-03-04: 200 [IU] via INTRAMUSCULAR
  Filled 2022-03-04: qty 200

## 2022-03-04 MED ORDER — DEXAMETHASONE SODIUM PHOSPHATE 10 MG/ML IJ SOLN
INTRAMUSCULAR | Status: AC
  Filled 2022-03-04: qty 1

## 2022-03-04 MED ORDER — DIAZEPAM 5 MG PO TABS
5.0000 mg | ORAL_TABLET | ORAL | Status: AC
  Administered 2022-03-04: 5 mg via ORAL

## 2022-03-04 MED ORDER — SODIUM CHLORIDE (PF) 0.9 % IJ SOLN
INTRAMUSCULAR | Status: AC
  Filled 2022-03-04: qty 10

## 2022-03-04 MED ORDER — ROPIVACAINE HCL 2 MG/ML IJ SOLN
9.0000 mL | Freq: Once | INTRAMUSCULAR | Status: AC
  Administered 2022-03-04: 9 mL via PERINEURAL

## 2022-03-04 MED ORDER — DIAZEPAM 5 MG PO TABS
ORAL_TABLET | ORAL | Status: AC
  Filled 2022-03-04: qty 1

## 2022-03-04 MED ORDER — IOHEXOL 180 MG/ML  SOLN
10.0000 mL | Freq: Once | INTRAMUSCULAR | Status: AC
  Administered 2022-03-04: 10 mL via INTRA_ARTICULAR
  Filled 2022-03-04: qty 20

## 2022-03-04 MED ORDER — LIDOCAINE HCL 2 % IJ SOLN
20.0000 mL | Freq: Once | INTRAMUSCULAR | Status: AC
  Administered 2022-03-04: 400 mg

## 2022-03-04 MED ORDER — METHYLPREDNISOLONE ACETATE 80 MG/ML IJ SUSP
80.0000 mg | Freq: Once | INTRAMUSCULAR | Status: AC
  Administered 2022-03-04: 80 mg via INTRA_ARTICULAR

## 2022-03-04 MED ORDER — LIDOCAINE HCL 2 % IJ SOLN
INTRAMUSCULAR | Status: AC
  Filled 2022-03-04: qty 20

## 2022-03-04 NOTE — Addendum Note (Signed)
Addended by: Janett Billow on: 03/04/2022 02:57 PM   Modules accepted: Orders

## 2022-03-04 NOTE — Progress Notes (Signed)
PROVIDER NOTE: Information contained herein reflects review and annotations entered in association with encounter. Interpretation of such information and data should be left to medically-trained personnel. Information provided to patient can be located elsewhere in the medical record under "Patient Instructions". Document created using STT-dictation technology, any transcriptional errors that may result from process are unintentional.    Patient: Lindsay Austin  Service Category: Procedure  Provider: [name], MD  DOB: 01-23-1961  DOS: 03/04/2022  Location: [facility]  MRN: [id number]  Setting: Ambulatory - outpatient  Referring Provider: [name], MD  Type: Established Patient  Specialty: Interventional Pain Management  PCP: [name], [place]   Primary Reason for Visit: Interventional Pain Management Treatment. CC: migraine     Procedure:            Botox for migraine management #4 (#1 done 02/14/2021, #2 07/23/21, #3 11/07/21)                 1. Chronic migraine without aura without status migrainosus, not intractable   2. Sacroiliac joint dysfunction of right side   3. Chronic pain syndrome   4. Sacroiliac joint dysfunction of left side   5. Piriformis syndrome of both sides     Pain Score: Pre-procedure: 4 /10 Post-procedure: 4 /10     Pre-op H&P Assessment:  Lindsay Austin is a 61 y.o. (year old), female patient, seen today for interventional treatment. 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 Lindsay Austin has a current medication list which includes the following  prescription(s): albuterol, bupropion, clopidogrel, dicyclomine, fentanyl, [START ON 03/11/2022] fentanyl, [START ON 04/10/2022] fentanyl, methocarbamol, naloxone, ondansetron, pregabalin, risperidone, rizatriptan, sucralfate, and trazodone, and the following Facility-Administered Medications: botulinum toxin type a. Her primarily concern today is the Hip Pain (hip)   Initial Vital Signs:  Pulse/HCG Rate: 94ECG Heart Rate: 78 Temp: 98.2 F (36.8 C) Resp: 16 BP: 100/70 SpO2: 98 %  BMI: Estimated body mass index is 31.41 kg/m as calculated from the following:   Height as of this encounter: '5\' 4"'$  (1.626 m).   Weight as of this encounter: 183 lb (83 kg).  Risk Assessment: Allergies: Reviewed. She is allergic to methadone, other, and tape.  Allergy Precautions: None required Coagulopathies: Reviewed. None identified.  Blood-thinner therapy: None at this time Active Infection(s): Reviewed. None identified. Lindsay Austin is afebrile  Site Confirmation: Lindsay Austin was asked to confirm the procedure and laterality before marking the site Procedure checklist: Completed Consent: Before the procedure and under the influence of no sedative(s), amnesic(s), or anxiolytics, the patient was informed of the treatment options, risks and possible complications. To fulfill our ethical and legal obligations, as recommended by the American Medical Association's Code of Ethics, I have informed the patient of my clinical impression; the nature and purpose of the treatment or procedure; the risks, benefits, and possible complications of the intervention; the alternatives, including doing nothing; the risk(s) and benefit(s) of the alternative treatment(s) or procedure(s); and the risk(s) and benefit(s) of doing nothing. The patient was provided information about the general risks and possible complications associated with the procedure. These may include, but are not limited to: failure to achieve desired goals, infection,  bleeding, organ or nerve damage, allergic reactions, paralysis,  and death. In addition, the patient was informed of those risks and complications associated to the procedure, such as failure to decrease pain; infection; bleeding; organ or nerve damage with subsequent damage to sensory, motor, and/or autonomic systems, resulting in permanent pain, numbness, and/or weakness of one or several areas of the body; allergic reactions; (i.e.: anaphylactic reaction); and/or death. Furthermore, the patient was informed of those risks and complications associated with the medications. These include, but are not limited to: allergic reactions (i.e.: anaphylactic or anaphylactoid reaction(s)); adrenal axis suppression; blood sugar elevation that in diabetics may result in ketoacidosis or comma; water retention that in patients with history of congestive heart failure may result in shortness of breath, pulmonary edema, and decompensation with resultant heart failure; weight gain; swelling or edema; medication-induced neural toxicity; particulate matter embolism and blood vessel occlusion with resultant organ, and/or nervous system infarction; and/or aseptic necrosis of one or more joints. Finally, the patient was informed that Medicine is not an exact science; therefore, there is also the possibility of unforeseen or unpredictable risks and/or possible complications that may result in a catastrophic outcome. The patient indicated having understood very clearly. We have given the patient no guarantees and we have made no promises. Enough time was given to the patient to ask questions, all of which were answered to the patient's satisfaction. Lindsay Austin has indicated that she wanted to continue with the procedure. Attestation: I, the ordering provider, attest that I have discussed with the patient the benefits, risks, side-effects, alternatives, likelihood of achieving goals, and potential problems during recovery for the  procedure that I have provided informed consent. Date  Time: 03/04/2022 10:45 AM  Pre-Procedure Preparation:  Monitoring: As per clinic protocol. Respiration, ETCO2, SpO2, BP, heart rate and rhythm monitor placed and checked for adequate function Safety Precautions: Patient was assessed for positional comfort and pressure points before starting the procedure. Time-out: I initiated and conducted the "Time-out" before starting the procedure, as per protocol. The patient was asked to participate by confirming the accuracy of the "Time Out" information. Verification of the correct person, site, and procedure were performed and confirmed by me, the nursing staff, and the patient. "Time-out" conducted as per Joint Commission's Universal Protocol (UP.01.01.01). Time: 1128  Constitution and injection details as below:   1 x 200 unit vial of Botox brand onabotulinum toxin A was diluted with 4 mL preservative free saline, for a final concentration of 5 units/0.1 mL. Injection sites were prepared for injection with alcohol swab  Botox was injected as follows:  5 units left corrugator 5 units right corrugator 5 units procerus  5 units in 2 sites, right frontalis (10 units total) 5units in 2 sites, left frontalis (10 units total) 5 units in each of 4 sites right temporalis (20 units total) 5 units in each of 4 sites left temporalis (20 units total) 5 units in each of 3 sites right occipitalis (15 units total) 5 units in each of 3 sites left occipitalis (15 units total) 5 units in each of 2 sites right cervical paraspinal at approximate C3-4 level (10 units total) 5 units in each of 2 sites left cervical paraspinal muscles at approximate C3-4 level (10 units total) 5 units in each of 3 sites right trapezius (15 units total) 5 units in each of 3 sites left trapezius (15 units total)  45 units were wasted  A total of 155 units was administered by Dr Holley Raring        Post-operative Assessment:  Post-procedure Vital Signs:  Pulse/HCG Rate: 9473 Temp: 98.2 F (36.8 C) Resp: 12 BP: 96/63 SpO2: 93 %  EBL: None  Complications: No immediate post-treatment complications observed by team, or reported by patient.  Note: The patient tolerated the entire procedure well. A repeat set of vitals were taken after the procedure and the patient was kept under observation following institutional policy, for this type of procedure. Post-procedural neurological assessment was performed, showing return to baseline, prior to discharge. The patient was provided with post-procedure discharge instructions, including a section on how to identify potential problems. Should any problems arise concerning this procedure, the patient was given instructions to immediately contact us, at any time, without hesitation. In any case, we plan to contact the patient by telephone for a follow-up status report regarding this interventional procedure.  Comments:  No additional relevant information.  Plan of Care   Chronic Opioid Analgesic:   fentanyl patch  50 mcg an hour; 120 MME.   Medications ordered for procedure: Meds ordered this encounter  Medications   botulinum toxin Type A (BOTOX) injection 155 Units   iohexol (OMNIPAQUE) 180 MG/ML injection 10 mL    Must be Myelogram-compatible. If not available, you may substitute with a water-soluble, non-ionic, hypoallergenic, myelogram-compatible radiological contrast medium.   lidocaine (XYLOCAINE) 2 % (with pres) injection 400 mg   dexamethasone (DECADRON) injection 10 mg   ropivacaine (PF) 2 mg/mL (0.2%) (NAROPIN) injection 9 mL   ropivacaine (PF) 2 mg/mL (0.2%) (NAROPIN) injection 9 mL   methylPREDNISolone acetate (DEPO-MEDROL) injection 80 mg   Medications administered: We administered iohexol, lidocaine, dexamethasone, ropivacaine (PF) 2 mg/mL (0.2%), ropivacaine (PF) 2 mg/mL (0.2%), and methylPREDNISolone acetate.  See the medical record for exact dosing,  route, and time of administration.  Follow-up plan:   Return for Keep sch. appt.     Recent Visits Date Type Provider Dept  02/05/22 Office Visit [name], MD [facility]  12/19/21 Office Visit [name], MD [facility]  Showing recent visits within past 90 days and meeting all other requirements Today's Visits Date Type Provider Dept  03/04/22 Procedure visit [name], MD [facility]  Showing today's visits and meeting all other requirements Future Appointments Date Type Provider Dept  05/07/22 Appointment [name], MD [facility]  Showing future appointments within next 90 days and meeting all other requirements  Disposition: Discharge home  Discharge (Date  Time): 03/04/2022;   hrs.   Primary Care Physician: [name], [place] Location: [facility] Note by: [name], MD Date: 03/04/2022; Time: 11:36 AM  Disclaimer:  Medicine is not an exact science. The only guarantee in medicine is that nothing is guaranteed. It is important to note that the decision to proceed with this intervention was based on the information collected from the patient. The Data and conclusions were drawn from the patient's questionnaire, the interview, and the physical examination. Because the information was provided in large part by the patient, it cannot be guaranteed that it has not been purposely or unconsciously manipulated. Every effort has been made to obtain as much relevant data as possible for this evaluation. It is important to note that the conclusions that lead to this procedure are derived in large part from the available data. Always take into account that the treatment will also be dependent on availability of resources and existing treatment guidelines, considered by other Pain Management Practitioners as being common knowledge and practice, at the time of the  intervention. For Medico-Legal purposes, it is  also important to point out that variation in procedural techniques and pharmacological choices are the acceptable norm. The indications, contraindications, technique, and results of the above procedure should only be interpreted and judged by a Board-Certified Interventional Pain Specialist with extensive familiarity and expertise in the same exact procedure and technique.

## 2022-03-04 NOTE — Patient Instructions (Signed)

## 2022-03-04 NOTE — Addendum Note (Signed)
Addended by: [name] on: 03/04/2022 03:07 PM   Modules accepted: Orders

## 2022-03-04 NOTE — Progress Notes (Signed)
PROVIDER NOTE: Interpretation of information contained herein should be left to medically-trained personnel. Specific patient instructions are provided elsewhere under "Patient Instructions" section of medical record. This document was created in part using STT-dictation technology, any transcriptional errors that may result from this process are unintentional.  Patient: Lindsay Austin Type: Established DOB: [birth year] MRN: [id number] PCP: [name], [place]  Service: Procedure DOS: 03/04/2022 Setting: Ambulatory Location: Ambulatory outpatient facility Delivery: Face-to-face Provider: [name], MD Specialty: Interventional Pain Management Specialty designation: 09 Location: Outpatient facility Ref. Prov.: [name], MD    Primary Reason for Visit: Interventional Pain Management Treatment. CC: Hip Pain (hip)    Procedure:          Anesthesia, Analgesia, Anxiolysis:  Type: Therapeutic Sacroiliac Joint Steroid Injection  & Bilateral Piriformis TPI  #2 Region: Inferior Lumbosacral Region Level: PIIS (Posterior Inferior Iliac Spine) Laterality: Bilateral  Anesthesia: Local (1-2% Lidocaine)  Anxiolysis: Oral Valium 5 mg PO  Guidance: Fluoroscopy           Position: Prone           Bilateral SI joint arthropathy, dysfunction and bilateral piriformis syndrome  NAS-11 Pain score:   Pre-procedure: 4 /10   Post-procedure: 4 /10     Pre-op H&P Assessment:  Lindsay Austin is a 61 y.o. (year old), female patient, seen today for interventional treatment. 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  Lindsay Austin has a current medication list which includes the following prescription(s): albuterol, bupropion, clopidogrel, dicyclomine, fentanyl, [START ON 03/11/2022] fentanyl, [START ON 04/10/2022] fentanyl, methocarbamol, naloxone, ondansetron, pregabalin, risperidone, rizatriptan, sucralfate, and trazodone, and the following Facility-Administered Medications: botulinum toxin type a, dexamethasone, iohexol, lidocaine, methylprednisolone acetate, and ropivacaine (pf) 2 mg/ml (0.2%). Her primarily concern today is the Hip Pain (hip)  Initial Vital Signs:  Pulse/HCG Rate:  94 ECG Heart Rate: 78 Temp: 98.2 F (36.8 C) Resp: 16 BP: 100/70 SpO2: 98 %  BMI: Estimated body mass index is 31.41 kg/m as calculated from the following:   Height as of this encounter: '5\' 4"'$  (1.626 m).   Weight as of this encounter: 183 lb (83 kg).  Risk Assessment: Allergies: Reviewed. She is allergic to methadone, other, and tape.  Allergy Precautions: None required Coagulopathies: Reviewed. None identified.  Blood-thinner therapy: None at this time Active Infection(s): Reviewed. None identified. Lindsay Austin is afebrile  Site Confirmation: Lindsay Austin was asked to confirm the procedure and laterality before marking the site Procedure checklist: Completed Consent: Before the procedure and under the influence of no sedative(s), amnesic(s), or anxiolytics, the patient was informed of the treatment options, risks and possible complications. To fulfill our ethical and legal obligations, as recommended by the American Medical Association's Code of Ethics, I have informed the patient of my clinical impression; the nature and purpose of the treatment or procedure; the risks, benefits, and possible complications of the intervention; the alternatives, including doing nothing; the risk(s) and benefit(s) of the alternative treatment(s) or procedure(s); and the risk(s) and benefit(s) of doing nothing. The patient was provided information  about the general risks and possible complications associated with the procedure. These may include, but are not limited to: failure to achieve desired goals, infection, bleeding, organ or nerve damage, allergic reactions, paralysis,  and death. In addition, the patient was informed of those risks and complications associated to the procedure, such as failure to decrease pain; infection; bleeding; organ or nerve damage with subsequent damage to sensory, motor, and/or autonomic systems, resulting in permanent pain, numbness, and/or weakness of one or several areas of the body; allergic reactions; (i.e.: anaphylactic reaction); and/or death. Furthermore, the patient was informed of those risks and complications associated with the medications. These include, but are not limited to: allergic reactions (i.e.: anaphylactic or anaphylactoid reaction(s)); adrenal axis suppression; blood sugar elevation that in diabetics may result in ketoacidosis or comma; water retention that in patients with history of congestive heart failure may result in shortness of breath, pulmonary edema, and decompensation with resultant heart failure; weight gain; swelling or edema; medication-induced neural toxicity; particulate matter embolism and blood vessel occlusion with resultant organ, and/or nervous system infarction; and/or aseptic necrosis of one or more joints. Finally, the patient was informed that Medicine is not an exact science; therefore, there is also the possibility of unforeseen or unpredictable risks and/or possible complications that may result in a catastrophic outcome. The patient indicated having understood very clearly. We have given the patient no guarantees and we have made no promises. Enough time was given to the patient to ask questions, all of which were answered to the patient's satisfaction. Lindsay Austin has indicated that she wanted to continue with the procedure. Attestation: I, the ordering provider, attest  that I have discussed with the patient the benefits, risks, side-effects, alternatives, likelihood of achieving goals, and potential problems during recovery for the procedure that I have provided informed consent. Date  Time: 03/04/2022 10:45 AM  Pre-Procedure Preparation:  Monitoring: As per clinic protocol. Respiration, ETCO2, SpO2, BP, heart rate and rhythm monitor placed and checked for adequate function Safety Precautions: Patient was assessed for positional comfort and pressure points before starting the procedure. Time-out: I initiated and conducted the "Time-out" before starting the procedure, as per protocol. The patient was asked to participate by confirming the accuracy of the "Time Out" information. Verification of the correct person, site, and procedure were performed and confirmed by me, the nursing staff, and the patient. "Time-out" conducted as per Joint Commission's Universal Protocol (UP.01.01.01). Time:    Description of Procedure:          Target Area: Inferior, posterior, aspect of the sacroiliac fissure Approach: Posterior, paraspinal, ipsilateral approach. Area Prepped: Entire Lower Lumbosacral Region DuraPrep (Iodine Povacrylex [0.7% available iodine] and Isopropyl Alcohol, 74% w/w) Safety Precautions: Aspiration looking for blood return was conducted prior to all injections. At no point did we inject any substances, as a needle was being advanced. No attempts were made at seeking any paresthesias. Safe injection practices and needle disposal techniques used. Medications properly checked for expiration dates. SDV (single dose vial) medications used. Description of the Procedure: Protocol guidelines were followed. The patient was placed in position over the procedure table. The target area was identified and the area prepped in the usual manner. Skin & deeper tissues infiltrated with local anesthetic. Appropriate amount of time allowed to pass for local anesthetics to take  effect. The procedure needle was advanced under fluoroscopic guidance into the sacroiliac joint until a firm endpoint was obtained. Proper needle placement secured. Negative aspiration confirmed. Solution injected in intermittent fashion, asking for systemic symptoms every 0.5cc of injectate. The needles were then removed and the area cleansed, making sure to leave some of the prepping solution back to take advantage of its long  term bactericidal properties. Vitals:   03/04/22 1052 03/04/22 1111  BP: 100/70 98/68  Pulse: 94   Resp: 16 10  Temp: 98.2 F (36.8 C)   TempSrc: Temporal   SpO2: 98% 95%  Weight: 183 lb (83 kg)   Height: '5\' 4"'$  (1.626 m)     Start Time:   hrs. End Time:   hrs. Materials:  Needle(s) Type: Spinal Needle Gauge: 25G Length: 3.5-in Medication(s): Please see orders for medications and dosing details.  10 cc solution made of 9 cc of 0.2% ropivacaine, 1 cc of methylprednisolone, 80 mg/cc.  5 cc injected into the left sacroiliac joint after contrast confirmation.  5 cc injected into the right sacroiliac joint after contrast confirmation.  Afterwards bilateral piriformis trigger point injections were done,  1 cm inferior, 1 cm deep, 1 cm lateral to the inferior fissure of the left and right SI joint.  Contrast was injected to confirm piriformis muscle striations.  8 cc solution made of 7 cc of 0.2% ropivacaine, 1 cc of Decadron 10 mg/cc.  4 cc injected into the left piriformis muscle, 4 cc injected into the right piriformis muscle.   Imaging Guidance (Non-Spinal):          Type of Imaging Technique: Fluoroscopy Guidance (Non-Spinal) Indication(s): Assistance in needle guidance and placement for procedures requiring needle placement in or near specific anatomical locations not easily accessible without such assistance. Exposure Time: Please see nurses notes. Contrast: Before injecting any contrast, we confirmed that the patient did not have an allergy to iodine, shellfish,  or radiological contrast. Once satisfactory needle placement was completed at the desired level, radiological contrast was injected. Contrast injected under live fluoroscopy. No contrast complications. See chart for type and volume of contrast used. Fluoroscopic Guidance: I was personally present during the use of fluoroscopy. "Tunnel Vision Technique" used to obtain the best possible view of the target area. Parallax error corrected before commencing the procedure. "Direction-depth-direction" technique used to introduce the needle under continuous pulsed fluoroscopy. Once target was reached, antero-posterior, oblique, and lateral fluoroscopic projection used confirm needle placement in all planes. Images permanently stored in EMR. Interpretation: I personally interpreted the imaging intraoperatively. Adequate needle placement confirmed in multiple planes. Appropriate spread of contrast into desired area was observed. No evidence of afferent or efferent intravascular uptake. Permanent images saved into the patient's record.   Post-operative Assessment:  Post-procedure Vital Signs:  Pulse/HCG Rate:  9478 Temp:  98.2 F (36.8 C) Resp: 10 BP: 98/68 SpO2: 95 %  EBL: None  Complications: No immediate post-treatment complications observed by team, or reported by patient.  Note: The patient tolerated the entire procedure well. A repeat set of vitals were taken after the procedure and the patient was kept under observation following institutional policy, for this type of procedure. Post-procedural neurological assessment was performed, showing return to baseline, prior to discharge. The patient was provided with post-procedure discharge instructions, including a section on how to identify potential problems. Should any problems arise concerning this procedure, the patient was given instructions to immediately contact us, at any time, without hesitation. In any case, we plan to contact the patient by  telephone for a follow-up status report regarding this interventional procedure.  Comments:  No additional relevant information.  Plan of Care  Orders:  Orders Placed This Encounter  Procedures   DG PAIN CLINIC C-ARM 1-60 MIN NO REPORT    Intraoperative interpretation by procedural physician at [place].    Standing Status:  Standing    Number of Occurrences:   1    Order Specific Question:   Reason for exam:    Answer:   Assistance in needle guidance and placement for procedures requiring needle placement in or near specific anatomical locations not easily accessible without such assistance.   Chronic Opioid Analgesic:  fentanyl patch  50 mcg an hour; 120 MME.   Medications ordered for procedure: Meds ordered this encounter  Medications   botulinum toxin Type A (BOTOX) injection 155 Units   iohexol (OMNIPAQUE) 180 MG/ML injection 10 mL    Must be Myelogram-compatible. If not available, you may substitute with a water-soluble, non-ionic, hypoallergenic, myelogram-compatible radiological contrast medium.   lidocaine (XYLOCAINE) 2 % (with pres) injection 400 mg   dexamethasone (DECADRON) injection 10 mg   ropivacaine (PF) 2 mg/mL (0.2%) (NAROPIN) injection 9 mL   ropivacaine (PF) 2 mg/mL (0.2%) (NAROPIN) injection 9 mL   methylPREDNISolone acetate (DEPO-MEDROL) injection 80 mg   Medications administered: We administered ropivacaine (PF) 2 mg/mL (0.2%).  See the medical record for exact dosing, route, and time of administration.  Follow-up plan:   Return for Keep sch. appt.      Recent Visits Date Type Provider Dept  02/05/22 Office Visit [name], MD [facility]  12/19/21 Office Visit [name], MD [facility]  Showing recent visits within past 90 days and meeting all other requirements Today's Visits Date Type Provider Dept  03/04/22 Procedure visit [name], MD [facility]  Showing today's visits and meeting all  other requirements Future Appointments Date Type Provider Dept  05/07/22 Appointment [name], MD [facility]  Showing future appointments within next 90 days and meeting all other requirements  Disposition: Discharge home  Discharge (Date  Time): 03/04/2022;   hrs.   Primary Care Physician: [name], [place] Location: [facility] Note by: [name], MD Date: 03/04/2022; Time: 11:12 AM  Disclaimer:  Medicine is not an exact science. The only guarantee in medicine is that nothing is guaranteed. It is important to note that the decision to proceed with this intervention was based on the information collected from the patient. The Data and conclusions were drawn from the patient's questionnaire, the interview, and the physical examination. Because the information was provided in large part by the patient, it cannot be guaranteed that it has not been purposely or unconsciously manipulated. Every effort has been made to obtain as much relevant data as possible for this evaluation. It is important to note that the conclusions that lead to this procedure are derived in large part from the available data. Always take into account that the treatment will also be dependent on availability of resources and existing treatment guidelines, considered by other Pain Management Practitioners as being common knowledge and practice, at the time of the intervention. For Medico-Legal purposes, it is also important to point out that variation in procedural techniques and pharmacological choices are the acceptable norm. The indications, contraindications, technique, and results of the above procedure should only be interpreted and judged by a Board-Certified Interventional Pain Specialist with extensive familiarity and expertise in the same exact procedure and technique.

## 2022-03-05 ENCOUNTER — Telehealth: Payer: Self-pay

## 2022-03-22 IMAGING — MR MR HEAD W/O CM
11 series · 45 of 48 positions shown · non-contrast
Comparison: 12/29/2020

CLINICAL DATA: Headache and weakness in the left hand. Unsteady
gait.

EXAM:
MRI HEAD WITHOUT CONTRAST
TECHNIQUE: Multiplanar, multiecho pulse sequences of the brain and surrounding
structures were obtained without intravenous contrast.

[Series 5: ax dwi_tracew · axial · 3.0mm · 0.65mm/px · z∈[-66,+86]mm · 5 of 48 slices shown]
[im 1/48]
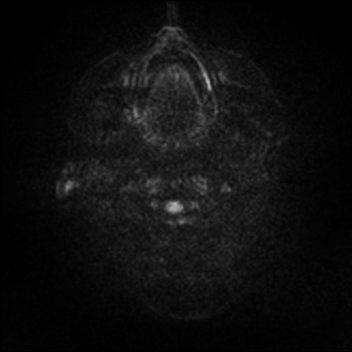
[im 12/48]
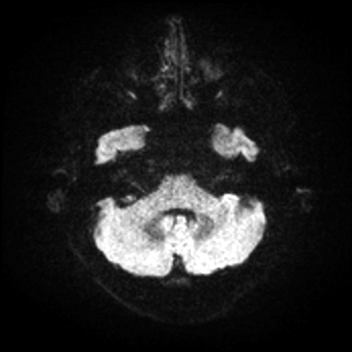
[im 24/48]
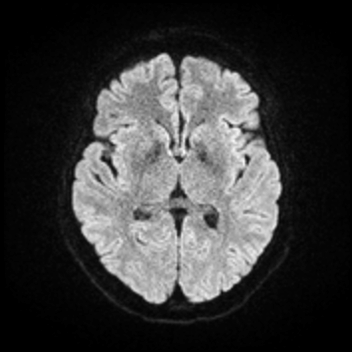
[im 36/48]
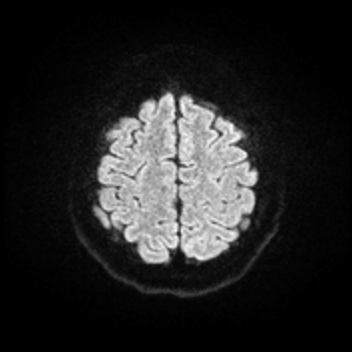
[im 48/48]
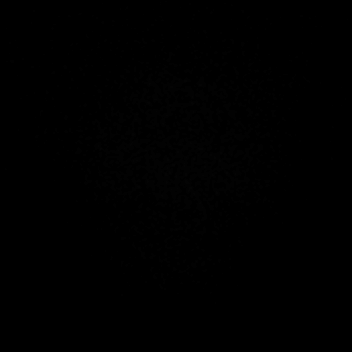

[Series 6: ax dwi_adc · axial · 3.0mm · 0.65mm/px · z∈[-66,+83]mm · 5 of 47 slices shown]
[im 1/47]
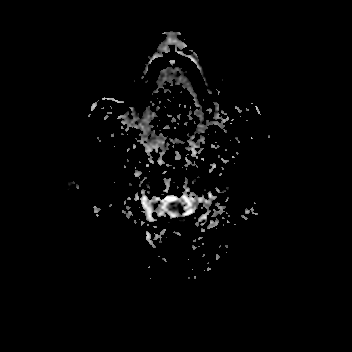
[im 12/47]
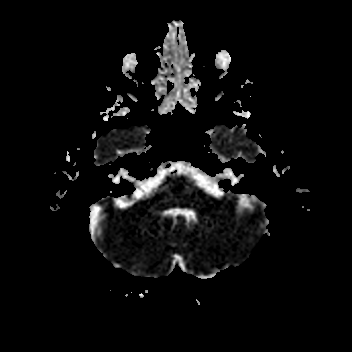
[im 24/47]
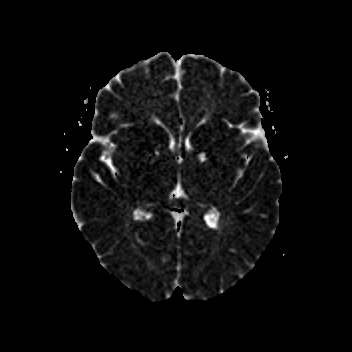
[im 35/47]
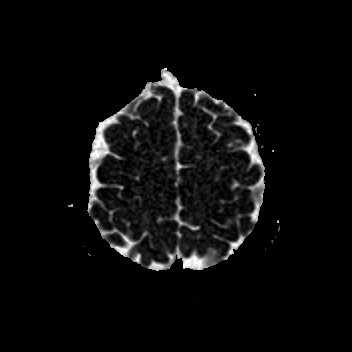
[im 47/47]
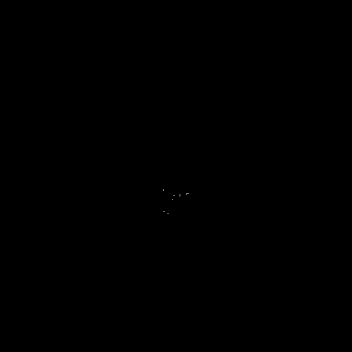

[Series 7: cor dwi_tracew · coronal · 5.0mm · 0.60mm/px · 3 of 38 slices shown]
[im 1/38]
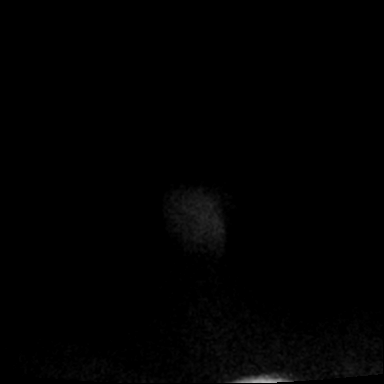
[im 19/38]
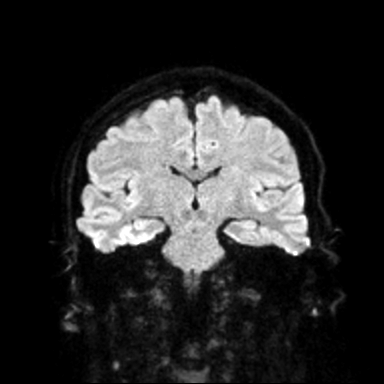
[im 38/38]
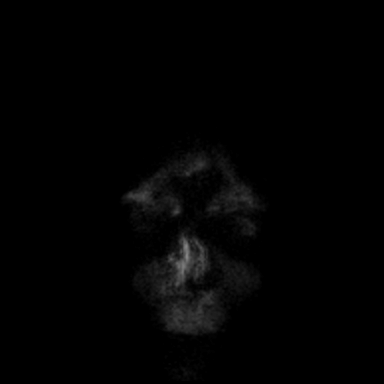

[Series 8: cor dwi_adc · coronal · 5.0mm · 0.60mm/px · 3 of 35 slices shown]
[im 1/35]
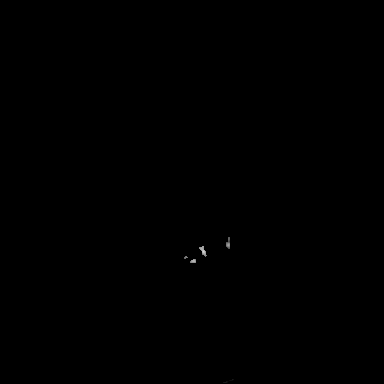
[im 18/35]
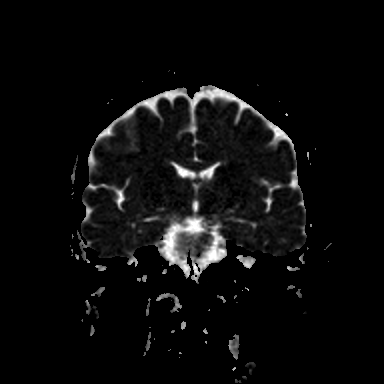
[im 35/35]
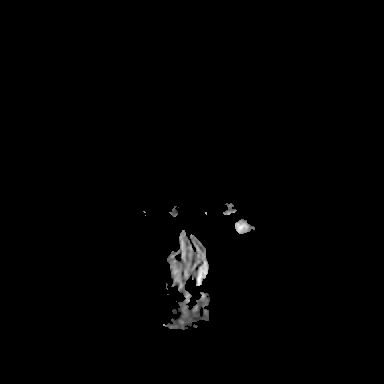

[Series 9: T1 · sagittal · 5.0mm · 0.62mm/px · 2 of 23 slices shown (1 of 2)]
[im 1/23]
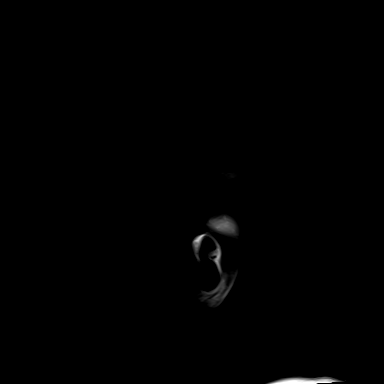
[im 23/23]
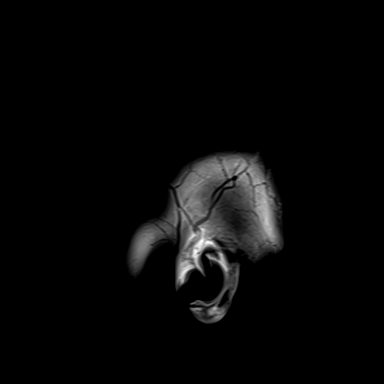

[Series 10: T2 · axial · 5.0mm · 0.53mm/px · z∈[-63,+78]mm · 2 of 25 slices shown (1 of 2)]
[im 1/25]
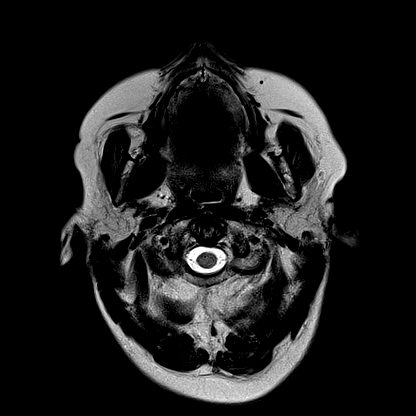
[im 25/25]
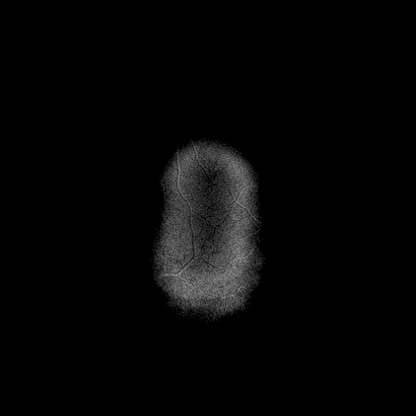

[Series 12: pha_images · axial · 3.0mm · 0.90mm/px · z∈[-67,+83]mm · 4 of 52 slices shown]
[im 1/52]
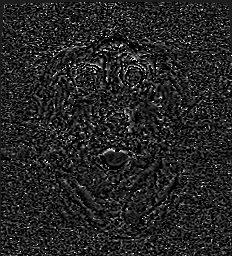
[im 18/52]
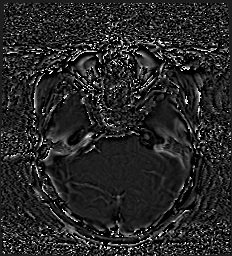
[im 35/52]
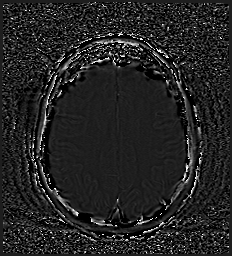
[im 52/52]
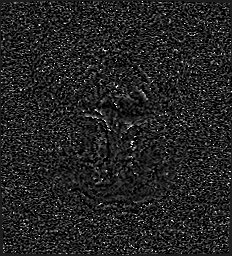

[Series 13: swi_images · axial · 3.0mm · 0.90mm/px · z∈[-67,+83]mm · 4 of 52 slices shown]
[im 1/52]
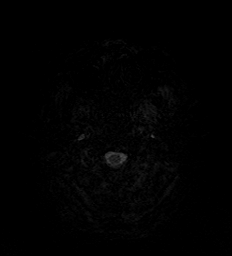
[im 18/52]
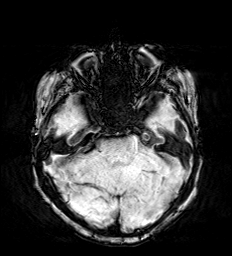
[im 35/52]
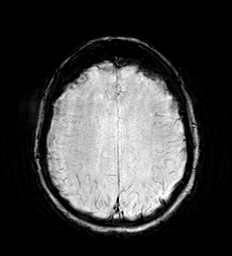
[im 52/52]
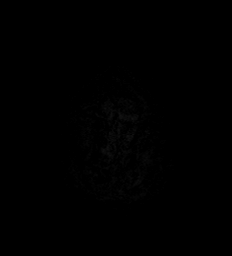

[Series 15: FLAIR · axial · 3.0mm · 0.53mm/px · z∈[-64,+80]mm · 4 of 50 slices shown]
[im 1/50]
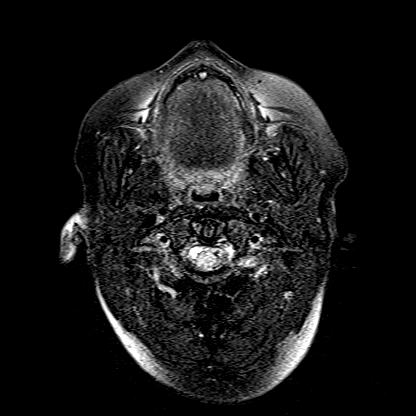
[im 17/50]
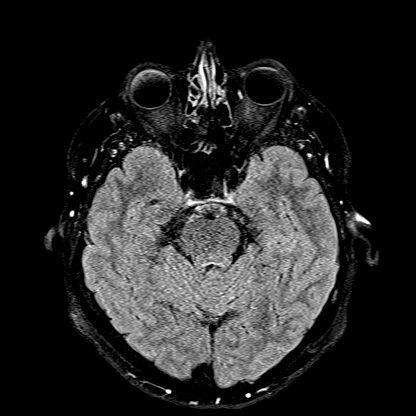
[im 33/50]
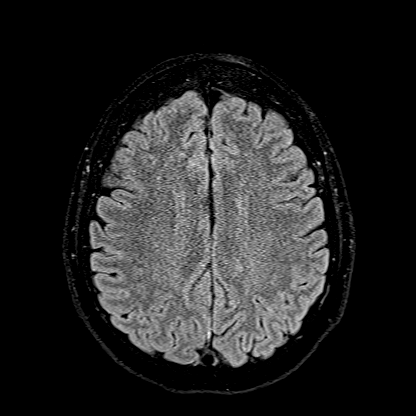
[im 50/50]
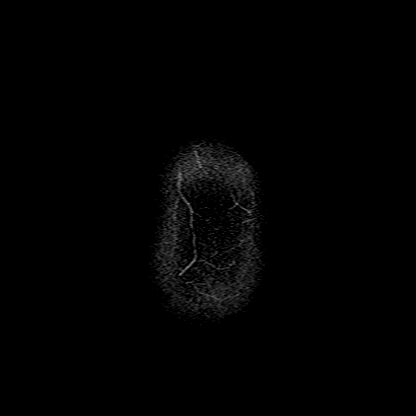

[Series 16: T1 · axial · 1.0mm · 0.98mm/px · z∈[-67,+89]mm · 11 of 160 slices shown (2 of 2)]
[im 1/160]
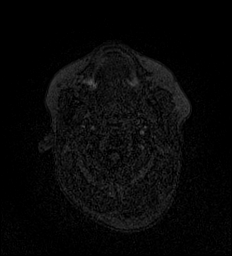
[im 13/160]
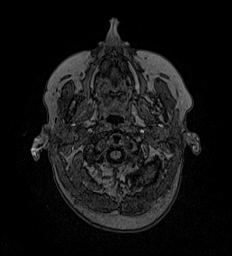
[im 25/160]
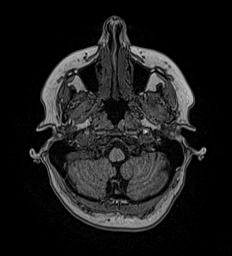
[im 37/160]
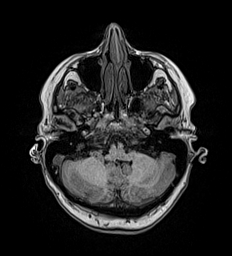
[im 49/160]
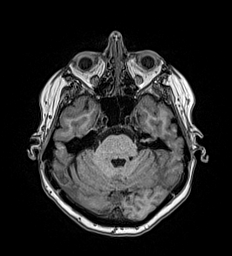
[im 62/160]
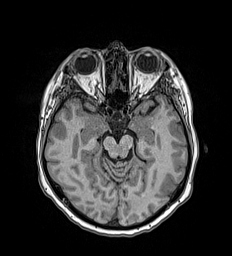
[im 74/160]
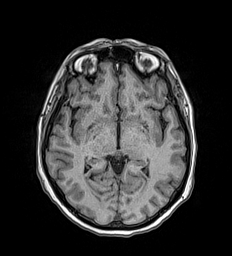
[im 86/160]
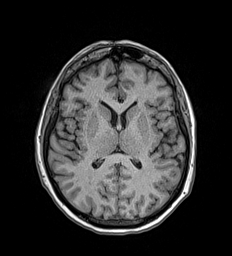
[im 111/160]
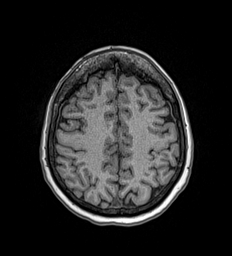
[im 135/160]
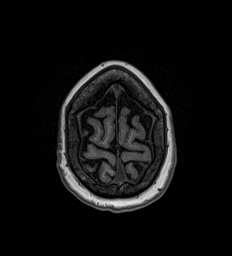
[im 160/160]
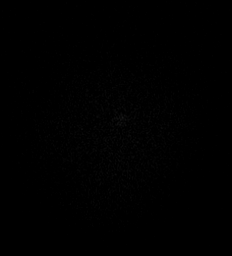

[Series 17: T2 · coronal · 5.0mm · 0.57mm/px · 2 of 28 slices shown (2 of 2)]
[im 1/28]
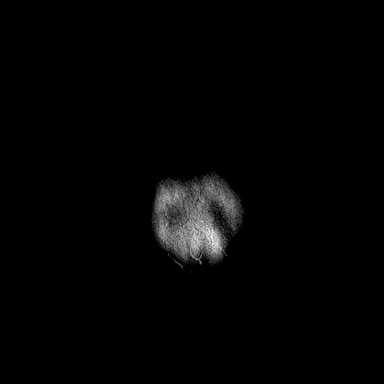
[im 28/28]
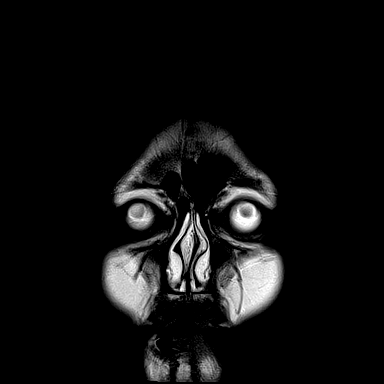

[45 of 48 positions shown; findings below may reference images not displayed]

FINDINGS: Brain: No acute infarction, hemorrhage, hydrocephalus, extra-axial
collection or mass lesion. Small FLAIR hyperintensities in the
cerebral white matter usually related to chronic small vessel
ischemia and commonly encountered by patient age. Dilated
perivascular spaces at the bilateral globus pallidus region. Brain
volume is normal.

Vascular: Normal flow voids.

Skull and upper cervical spine: Normal marrow signal.

Sinuses/Orbits: Negative.
IMPRESSION: 1. No acute finding or explanation for symptoms.
2. Mild, nonspecific white matter disease, likely early chronic
small vessel ischemia.

## 2022-05-07 ENCOUNTER — Other Ambulatory Visit: Payer: Self-pay | Admitting: Student in an Organized Health Care Education/Training Program

## 2022-05-07 ENCOUNTER — Encounter: Payer: Self-pay | Admitting: Student in an Organized Health Care Education/Training Program

## 2022-05-07 ENCOUNTER — Ambulatory Visit
Payer: Medicaid Other | Attending: Student in an Organized Health Care Education/Training Program | Admitting: Student in an Organized Health Care Education/Training Program

## 2022-05-07 VITALS — BP 118/76 | HR 89 | Temp 97.3°F | Ht 64.0 in | Wt 183.0 lb

## 2022-05-07 DIAGNOSIS — S7001XS Contusion of right hip, sequela: Secondary | ICD-10-CM | POA: Diagnosis present

## 2022-05-07 DIAGNOSIS — M47816 Spondylosis without myelopathy or radiculopathy, lumbar region: Secondary | ICD-10-CM | POA: Insufficient documentation

## 2022-05-07 DIAGNOSIS — M47812 Spondylosis without myelopathy or radiculopathy, cervical region: Secondary | ICD-10-CM | POA: Diagnosis present

## 2022-05-07 DIAGNOSIS — Z981 Arthrodesis status: Secondary | ICD-10-CM | POA: Insufficient documentation

## 2022-05-07 DIAGNOSIS — T85192S Other mechanical complication of implanted electronic neurostimulator (electrode) of spinal cord, sequela: Secondary | ICD-10-CM

## 2022-05-07 DIAGNOSIS — G9050 Complex regional pain syndrome I, unspecified: Secondary | ICD-10-CM | POA: Diagnosis present

## 2022-05-07 DIAGNOSIS — G43709 Chronic migraine without aura, not intractable, without status migrainosus: Secondary | ICD-10-CM | POA: Diagnosis not present

## 2022-05-07 DIAGNOSIS — M533 Sacrococcygeal disorders, not elsewhere classified: Secondary | ICD-10-CM | POA: Diagnosis present

## 2022-05-07 DIAGNOSIS — G5703 Lesion of sciatic nerve, bilateral lower limbs: Secondary | ICD-10-CM | POA: Diagnosis not present

## 2022-05-07 DIAGNOSIS — G894 Chronic pain syndrome: Secondary | ICD-10-CM

## 2022-05-07 DIAGNOSIS — Q761 Klippel-Feil syndrome: Secondary | ICD-10-CM | POA: Insufficient documentation

## 2022-05-07 DIAGNOSIS — G5701 Lesion of sciatic nerve, right lower limb: Secondary | ICD-10-CM | POA: Diagnosis present

## 2022-05-07 DIAGNOSIS — M4802 Spinal stenosis, cervical region: Secondary | ICD-10-CM | POA: Insufficient documentation

## 2022-05-07 MED ORDER — FENTANYL 50 MCG/HR TD PT72: 1.0000 | MEDICATED_PATCH | TRANSDERMAL | 0 refills | Status: AC

## 2022-05-07 MED ORDER — FENTANYL 50 MCG/HR TD PT72: 1.0000 | MEDICATED_PATCH | TRANSDERMAL | 0 refills | Status: DC

## 2022-05-07 NOTE — Progress Notes (Signed)
PROVIDER NOTE: Information contained herein reflects review and annotations entered in association with encounter. Interpretation of such information and data should be left to medically-trained personnel. Information provided to patient can be located elsewhere in the medical record under "Patient Instructions". Document created using STT-dictation technology, any transcriptional errors that may result from process are unintentional.    Patient: Lindsay Austin  Service Category: E/M  Provider: [name], MD  DOB: [birth year]  DOS: 05/07/2022  Specialty: Interventional Pain Management  MRN: [id number]  Setting: Ambulatory outpatient  PCP: [name], PA  Type: Established Patient    Referring Provider: [name], PA  Location: Office  Delivery: Face-to-face     HPI  [name], a 62 y.o. year old female, is here today because of her Sacroiliac joint dysfunction of right side [M53.3]. [name] primary complain today is Back Pain (lower)  Last encounter: My last encounter with her was on 03/04/22  Pertinent problems: [name] has Hx of lumbosacral spine surgery; History of lumbar fusion (L5-S1 fusion); Failed spinal cord stimulator, sequela; Cervical fusion syndrome (C4-C7); Chronic pain syndrome; Major depressive disorder with current active episode; Lumbar spondylosis; Cervical spondylosis without myelopathy; and Spinal stenosis in cervical region on their pertinent problem list. Pain Assessment: Severity of Chronic pain is reported as a 3 /10. Location: Back Right, Left/pain radiaities down both leg to her feet. Onset: More than a month ago. Quality: Aching, Constant, Burning, Throbbing, Nagging, Stabbing, Sharp, Shooting. Timing: Constant. Modifying factor(s): laying down and meds. Vitals:  height is '5\' 4"'$  (1.626 m) and weight is 183 lb (83 kg). Her temperature is 97.3 F (36.3 C) (abnormal). Her blood pressure is 118/76 and her pulse is 89. Her oxygen saturation is 99%.   Reason for  encounter: medication management.    Patient presents today for medication management.  No significant change in medical history.  Lindsay Austin is having increased migraines and would like to get scheduled for Botox.  Previous Botox treatment was 03/04/22  She has moved to [place] with her family.  Otherwise we will refill fentanyl patch as below.  No change in dose.  Renew annual urine toxicology screen for medication compliance and monitoring.    Pharmacotherapy Assessment  Analgesic:  fentanyl patch  50 mcg an hour; 120 MME.   Monitoring: [place] PMP: PDMP reviewed during this encounter.       Pharmacotherapy: No side-effects or adverse reactions reported. Compliance: No problems identified. Effectiveness: Clinically acceptable.  UDS:  Summary  Date Value Ref Range Status  04/18/2021 Note  Final    Comment:    ==================================================================== Tox[employer]SURE Select 13 (MW) ==================================================================== Test                             Result       Flag       Units  Drug Present and Declared for Prescription Verification   Fentanyl                       62           EXPECTED   ng/mg creat   Norfentanyl                    257          EXPECTED   ng/mg creat    Source of fentanyl is a scheduled prescription medication, including    IV, patch, and transmucosal formulations.  Norfentanyl is an expected    metabolite of fentanyl.  ==================================================================== Test                      Result    Flag   Units      Ref Range   Creatinine              190              mg/dL      >=20 ==================================================================== Declared Medications:  The flagging and interpretation on this report are based on the  following declared medications.  Unexpected results may arise from  inaccuracies in the declared medications.   **Note: The testing scope of this  panel includes these medications:   Fentanyl (Duragesic)   **Note: The testing scope of this panel does not include the  following reported medications:   Albuterol  Bupropion (Wellbutrin SR)  Clopidogrel (Plavix)  Dicyclomine (Bentyl)  Escitalopram (Lexapro)  Galcanezumab (Emgality)  Methocarbamol (Robaxin)  Multivitamin  Naloxone (Narcan)  Olmesartan (Benicar)  Ondansetron (Zofran)  Pregabalin (Lyrica)  Rizatriptan (Maxalt)  Sucralfate (Carafate)  Trazodone (Desyrel)  Triamcinolone (Kenalog) ==================================================================== For clinical consultation, please call [phone]. ====================================================================       ROS  Constitutional: Denies any fever or chills Gastrointestinal: No reported hemesis, hematochezia, vomiting, or acute GI distress Musculoskeletal: Low back pain Neurological:  Migraines  Medication Review  albuterol, buPROPion, clopidogrel, dicyclomine, fentaNYL, methocarbamol, naloxone, ondansetron, pregabalin, risperiDONE, rizatriptan, sucralfate, and traZODone  History Review  Allergy: [name] is allergic to methadone, other, and tape. Drug: [name]  reports no history of drug use. Alcohol:  reports no history of alcohol use. Tobacco:  reports that she has never smoked. She has never used smokeless tobacco. Social: [name]  reports that she has never smoked. She has never used smokeless tobacco. She reports that she does not drink alcohol and does not use drugs. Medical:  has a past medical history of Anxiety, Arthritis, Bladder cancer ([place]), Cancer of kidney ([place]), DDD (degenerative disc disease), cervical, Depression, Failed back syndrome, Fibromyalgia, GERD (gastroesophageal reflux disease), Headache, Heart murmur, Hyperlipemia, Hypertension, IBS (irritable bowel syndrome), Kidney failure, Renal insufficiency, RSD (reflex sympathetic dystrophy), Seizure ([place]), Stomach ulcer,  and Trigeminal neuralgia. Surgical: Ms. Braatz  has a past surgical history that includes Transurethral resection of bladder tumor with gyrus (turbt-gyrus) (2012, 2013); Nephroureterectomy (2011); Back surgery (1995); Cholecystectomy; Brain surgery (2004); trigeminal neuralgia (2004); Spinal cord stimulator implant (2004); colonscopy (2006); Posterior cervical fusion/foraminotomy (Bilateral, 07/05/2019); Posterior cervical laminectomy (Bilateral, 07/05/2019); and Breast biopsy (Left, 2017). Family: family history includes Dementia in her father; Diabetes in her mother; Heart disease in her mother; Kidney disease in her father.  Laboratory Chemistry Profile   Renal Lab Results  Component Value Date   BUN 13 12/29/2020   CREATININE 1.44 (H) 12/29/2020   LABCREA 71 08/29/2019   GFRAA 51 (L) 08/31/2019   GFRNONAA 42 (L) 12/29/2020     Hepatic Lab Results  Component Value Date   AST 32 08/29/2019   ALT 12 08/29/2019   ALBUMIN 4.1 08/29/2019   ALKPHOS 66 08/29/2019     Electrolytes Lab Results  Component Value Date   NA 139 12/29/2020   K 4.3 12/29/2020   CL 101 12/29/2020   CALCIUM 9.2 12/29/2020   MG 2.2 08/31/2019     Bone No results found for: "VD25OH", "VD125OH2TOT", "IH4742VZ5", "GL8756EP3", "25OHVITD1", "25OHVITD2", "25OHVITD3", "TESTOFREE", "TESTOSTERONE"   Inflammation (CRP: Acute Phase) (ESR: Chronic Phase)  No results found for: "CRP", "ESRSEDRATE", "LATICACIDVEN"     Note: Above Lab results reviewed.   Physical Exam  General appearance: Well nourished, well developed, and well hydrated. In no apparent acute distress Mental status: Alert, oriented x 3 (person, place, & time)       Respiratory: No evidence of acute respiratory distress Eyes: PERLA Vitals: BP 118/76   Pulse 89   Temp (!) 97.3 F (36.3 C)   Ht '5\' 4"'$  (1.626 m)   Wt 183 lb (83 kg)   SpO2 99%   BMI 31.41 kg/m  BMI: Estimated body mass index is 31.41 kg/m as calculated from the following:   Height as  of this encounter: '5\' 4"'$  (1.626 m).   Weight as of this encounter: 183 lb (83 kg). Ideal: Ideal body weight: 54.7 kg (120 lb 9.5 oz) Adjusted ideal body weight: 66 kg (145 lb 8.9 oz)  Lumbar Spine Area Exam  Skin & Axial Inspection: Well healed scar from previous spine surgery detected Alignment: Symmetrical Functional ROM: Pain restricted ROM affecting primarily the right Stability: No instability detected Muscle Tone/Strength: Functionally intact. No obvious neuro-muscular anomalies detected. Sensory (Neurological): Musculoskeletal pain pattern Palpation: No palpable anomalies       Provocative Tests: Hyperextension/rotation test: deferred today       Lumbar quadrant test (Kemp's test): deferred today       Lateral bending test: (+) due to pain.    Gait & Posture Assessment  Ambulation: antalgic Gait: Limited. Using assistive device to ambulate Posture: Difficulty standing up straight, due to pain    Lower Extremity Exam      Side: Right lower extremity   Side: Left lower extremity  Stability: No instability observed           Stability: No instability observed          Skin & Extremity Inspection: Skin color, temperature, and hair growth are WNL. No peripheral edema or cyanosis. No masses, redness, swelling, asymmetry, or associated skin lesions. No contractures.   Skin & Extremity Inspection: Skin color, temperature, and hair growth are WNL. No peripheral edema or cyanosis. No masses, redness, swelling, asymmetry, or associated skin lesions. No contractures.  Functional ROM: Pain restricted ROM for all joints of the lower extremity           Functional ROM: Pain restricted ROM for all joints of the lower extremity          Muscle Tone/Strength: Functionally intact. No obvious neuro-muscular anomalies detected.   Muscle Tone/Strength: Functionally intact. No obvious neuro-muscular anomalies detected.  Sensory (Neurological): Unimpaired         Sensory (Neurological): Unimpaired         DTR: Patellar: deferred today Achilles: deferred today Plantar: deferred today   DTR: Patellar: deferred today Achilles: deferred today Plantar: deferred today  Palpation: No palpable anomalies   Palpation: No palpable anomalies       Assessment   Status Diagnosis  Controlled Controlled Having a Flare-up 1. Sacroiliac joint dysfunction of right side   2. Sacroiliac joint dysfunction of left side   3. Chronic migraine without aura without status migrainosus, not intractable   4. Piriformis syndrome of both sides   5. History of lumbar fusion   6. Failed spinal cord stimulator, sequela   7. Lumbar spondylosis   8. RSD (reflex sympathetic dystrophy)   9. Chronic pain syndrome   10. Cervical spondylosis without myelopathy   11. Spinal stenosis in cervical region   12.  Cervical fusion syndrome (C4-T2)   13. Lumbar facet arthropathy   14. Cervical facet joint syndrome   15. Piriformis syndrome of right side   16. Contusion of right hip, sequela       Plan of Care  [name] has a current medication list which includes the following long-term medication(s): albuterol, bupropion, dicyclomine, risperidone, rizatriptan, sucralfate, and trazodone.  1. Sacroiliac joint dysfunction of right side - fentaNYL (DURAGESIC) 50 MCG/HR; Place 1 patch onto the skin every 3 (three) days. Most last 30 days.  Dispense: 10 patch; Refill: 0 - fentaNYL (DURAGESIC) 50 MCG/HR; Place 1 patch onto the skin every 3 (three) days. Most last 30 days.  Dispense: 10 patch; Refill: 0 - fentaNYL (DURAGESIC) 50 MCG/HR; Place 1 patch onto the skin every 3 (three) days. Most last 30 days.  Dispense: 10 patch; Refill: 0  2. Sacroiliac joint dysfunction of left side  3. Chronic migraine without aura without status migrainosus, not intractable  4. Piriformis syndrome of both sides  5. History of lumbar fusion - fentaNYL (DURAGESIC) 50 MCG/HR; Place 1 patch onto the skin every 3 (three) days. Most last  30 days.  Dispense: 10 patch; Refill: 0 - fentaNYL (DURAGESIC) 50 MCG/HR; Place 1 patch onto the skin every 3 (three) days. Most last 30 days.  Dispense: 10 patch; Refill: 0 - fentaNYL (DURAGESIC) 50 MCG/HR; Place 1 patch onto the skin every 3 (three) days. Most last 30 days.  Dispense: 10 patch; Refill: 0  6. Failed spinal cord stimulator, sequela - fentaNYL (DURAGESIC) 50 MCG/HR; Place 1 patch onto the skin every 3 (three) days. Most last 30 days.  Dispense: 10 patch; Refill: 0 - fentaNYL (DURAGESIC) 50 MCG/HR; Place 1 patch onto the skin every 3 (three) days. Most last 30 days.  Dispense: 10 patch; Refill: 0 - fentaNYL (DURAGESIC) 50 MCG/HR; Place 1 patch onto the skin every 3 (three) days. Most last 30 days.  Dispense: 10 patch; Refill: 0  7. Lumbar spondylosis - fentaNYL (DURAGESIC) 50 MCG/HR; Place 1 patch onto the skin every 3 (three) days. Most last 30 days.  Dispense: 10 patch; Refill: 0 - fentaNYL (DURAGESIC) 50 MCG/HR; Place 1 patch onto the skin every 3 (three) days. Most last 30 days.  Dispense: 10 patch; Refill: 0 - fentaNYL (DURAGESIC) 50 MCG/HR; Place 1 patch onto the skin every 3 (three) days. Most last 30 days.  Dispense: 10 patch; Refill: 0  8. RSD (reflex sympathetic dystrophy) - fentaNYL (DURAGESIC) 50 MCG/HR; Place 1 patch onto the skin every 3 (three) days. Most last 30 days.  Dispense: 10 patch; Refill: 0 - fentaNYL (DURAGESIC) 50 MCG/HR; Place 1 patch onto the skin every 3 (three) days. Most last 30 days.  Dispense: 10 patch; Refill: 0 - fentaNYL (DURAGESIC) 50 MCG/HR; Place 1 patch onto the skin every 3 (three) days. Most last 30 days.  Dispense: 10 patch; Refill: 0  9. Chronic pain syndrome - fentaNYL (DURAGESIC) 50 MCG/HR; Place 1 patch onto the skin every 3 (three) days. Most last 30 days.  Dispense: 10 patch; Refill: 0 - fentaNYL (DURAGESIC) 50 MCG/HR; Place 1 patch onto the skin every 3 (three) days. Most last 30 days.  Dispense: 10 patch; Refill: 0 - fentaNYL  (DURAGESIC) 50 MCG/HR; Place 1 patch onto the skin every 3 (three) days. Most last 30 days.  Dispense: 10 patch; Refill: 0 - ToxASSURE Select 13 (MW), Urine  10. Cervical spondylosis without myelopathy - fentaNYL (DURAGESIC) 50 MCG/HR; Place 1 patch onto  the skin every 3 (three) days. Most last 30 days.  Dispense: 10 patch; Refill: 0 - fentaNYL (DURAGESIC) 50 MCG/HR; Place 1 patch onto the skin every 3 (three) days. Most last 30 days.  Dispense: 10 patch; Refill: 0 - fentaNYL (DURAGESIC) 50 MCG/HR; Place 1 patch onto the skin every 3 (three) days. Most last 30 days.  Dispense: 10 patch; Refill: 0  11. Spinal stenosis in cervical region - fentaNYL (DURAGESIC) 50 MCG/HR; Place 1 patch onto the skin every 3 (three) days. Most last 30 days.  Dispense: 10 patch; Refill: 0 - fentaNYL (DURAGESIC) 50 MCG/HR; Place 1 patch onto the skin every 3 (three) days. Most last 30 days.  Dispense: 10 patch; Refill: 0 - fentaNYL (DURAGESIC) 50 MCG/HR; Place 1 patch onto the skin every 3 (three) days. Most last 30 days.  Dispense: 10 patch; Refill: 0  12. Cervical fusion syndrome (C4-T2) - fentaNYL (DURAGESIC) 50 MCG/HR; Place 1 patch onto the skin every 3 (three) days. Most last 30 days.  Dispense: 10 patch; Refill: 0 - fentaNYL (DURAGESIC) 50 MCG/HR; Place 1 patch onto the skin every 3 (three) days. Most last 30 days.  Dispense: 10 patch; Refill: 0 - fentaNYL (DURAGESIC) 50 MCG/HR; Place 1 patch onto the skin every 3 (three) days. Most last 30 days.  Dispense: 10 patch; Refill: 0  13. Lumbar facet arthropathy - fentaNYL (DURAGESIC) 50 MCG/HR; Place 1 patch onto the skin every 3 (three) days. Most last 30 days.  Dispense: 10 patch; Refill: 0 - fentaNYL (DURAGESIC) 50 MCG/HR; Place 1 patch onto the skin every 3 (three) days. Most last 30 days.  Dispense: 10 patch; Refill: 0 - fentaNYL (DURAGESIC) 50 MCG/HR; Place 1 patch onto the skin every 3 (three) days. Most last 30 days.  Dispense: 10 patch; Refill: 0  14.  Cervical facet joint syndrome - fentaNYL (DURAGESIC) 50 MCG/HR; Place 1 patch onto the skin every 3 (three) days. Most last 30 days.  Dispense: 10 patch; Refill: 0 - fentaNYL (DURAGESIC) 50 MCG/HR; Place 1 patch onto the skin every 3 (three) days. Most last 30 days.  Dispense: 10 patch; Refill: 0 - fentaNYL (DURAGESIC) 50 MCG/HR; Place 1 patch onto the skin every 3 (three) days. Most last 30 days.  Dispense: 10 patch; Refill: 0  15. Piriformis syndrome of right side - fentaNYL (DURAGESIC) 50 MCG/HR; Place 1 patch onto the skin every 3 (three) days. Most last 30 days.  Dispense: 10 patch; Refill: 0 - fentaNYL (DURAGESIC) 50 MCG/HR; Place 1 patch onto the skin every 3 (three) days. Most last 30 days.  Dispense: 10 patch; Refill: 0 - fentaNYL (DURAGESIC) 50 MCG/HR; Place 1 patch onto the skin every 3 (three) days. Most last 30 days.  Dispense: 10 patch; Refill: 0  16. Contusion of right hip, sequela - fentaNYL (DURAGESIC) 50 MCG/HR; Place 1 patch onto the skin every 3 (three) days. Most last 30 days.  Dispense: 10 patch; Refill: 0 - fentaNYL (DURAGESIC) 50 MCG/HR; Place 1 patch onto the skin every 3 (three) days. Most last 30 days.  Dispense: 10 patch; Refill: 0 - fentaNYL (DURAGESIC) 50 MCG/HR; Place 1 patch onto the skin every 3 (three) days. Most last 30 days.  Dispense: 10 patch; Refill: 0     Pharmacotherapy (Medications Ordered): Meds ordered this encounter  Medications   fentaNYL (DURAGESIC) 50 MCG/HR    Sig: Place 1 patch onto the skin every 3 (three) days. Most last 30 days.    Dispense:  10 patch  Refill:  0    [place] STOP ACT - Not applicable. Fill one day early if pharmacy is closed on scheduled refill date.   fentaNYL (DURAGESIC) 50 MCG/HR    Sig: Place 1 patch onto the skin every 3 (three) days. Most last 30 days.    Dispense:  10 patch    Refill:  0    [place] STOP ACT - Not applicable. Fill one day early if pharmacy is closed on scheduled refill date.   fentaNYL (DURAGESIC) 50  MCG/HR    Sig: Place 1 patch onto the skin every 3 (three) days. Most last 30 days.    Dispense:  10 patch    Refill:  0    [place] STOP ACT - Not applicable. Fill one day early if pharmacy is closed on scheduled refill date.   Follow-up for Botox for migraine management  Orders Placed This Encounter  Procedures   ToxASSURE Select 13 (MW), Urine    Volume: 30 ml(s). Minimum 3 ml of urine is needed. Document temperature of fresh sample. Indications: Long term (current) use of opiate analgesic (Z79.891)    Order Specific Question:   Release to patient    Answer:   Immediate   [facility]o [place]    Schedule after 06/04/21: botox for migraines    Standing Status:   Standing    Number of Occurrences:   1   Lyrica managed by neurology: 100 mg TID   Follow-up plan:   Return in about 4 weeks (around 06/04/2022) for Botox for migraines.   Recent Visits Date Type Provider Dept  03/04/22 Procedure visit [name], MD [facility]  Showing recent visits within past 90 days and meeting all other requirements Today's Visits Date Type Provider Dept  05/07/22 Office Visit [name], MD [facility]  Showing today's visits and meeting all other requirements Future Appointments Date Type Provider Dept  08/01/22 Appointment [name], MD [facility]  Showing future appointments within next 90 days and meeting all other requirements  I discussed the assessment and treatment plan with the patient. The patient was provided an opportunity to ask questions and all were answered. The patient agreed with the plan and demonstrated an understanding of the instructions.  Patient advised to call back or seek an in-person evaluation if the symptoms or condition worsens.  Duration of encounter: 35 minutes.  Note by: [name], MD Date: 05/07/2022; Time: 2:07 PM

## 2022-05-07 NOTE — Progress Notes (Signed)
Nursing Pain Medication Assessment:  Safety precautions to be maintained throughout the outpatient stay will include: orient to surroundings, keep bed in low position, maintain call bell within reach at all times, provide assistance with transfer out of bed and ambulation.  Medication Inspection Compliance: Pill count conducted under aseptic conditions, in front of the patient. Neither the pills nor the bottle was removed from the patient's sight at any time. Once count was completed pills were immediately returned to the patient in their original bottle.  Medication: Fentanyl patch Pill/Patch Count:  2 of 10 pills remain Pill/Patch Appearance: Markings consistent with prescribed medication Bottle Appearance: Standard pharmacy container. Clearly labeled. Filled Date: 69 / 13 / 2023 Last Medication intake:  TodaySafety precautions to be maintained throughout the outpatient stay will include: orient to surroundings, keep bed in low position, maintain call bell within reach at all times, provide assistance with transfer out of bed and ambulation.

## 2022-05-11 LAB — TOXASSURE SELECT 13 (MW), URINE

## 2022-07-31 ENCOUNTER — Ambulatory Visit
Payer: Medicaid Other | Attending: Student in an Organized Health Care Education/Training Program | Admitting: Student in an Organized Health Care Education/Training Program

## 2022-07-31 ENCOUNTER — Encounter: Payer: Self-pay | Admitting: Student in an Organized Health Care Education/Training Program

## 2022-07-31 VITALS — BP 88/64 | HR 101 | Temp 97.2°F | Resp 16 | Ht 64.0 in | Wt 173.0 lb

## 2022-07-31 DIAGNOSIS — S7001XS Contusion of right hip, sequela: Secondary | ICD-10-CM

## 2022-07-31 DIAGNOSIS — M4802 Spinal stenosis, cervical region: Secondary | ICD-10-CM | POA: Diagnosis present

## 2022-07-31 DIAGNOSIS — M47816 Spondylosis without myelopathy or radiculopathy, lumbar region: Secondary | ICD-10-CM

## 2022-07-31 DIAGNOSIS — Q761 Klippel-Feil syndrome: Secondary | ICD-10-CM

## 2022-07-31 DIAGNOSIS — Z981 Arthrodesis status: Secondary | ICD-10-CM | POA: Insufficient documentation

## 2022-07-31 DIAGNOSIS — G5701 Lesion of sciatic nerve, right lower limb: Secondary | ICD-10-CM

## 2022-07-31 DIAGNOSIS — G43709 Chronic migraine without aura, not intractable, without status migrainosus: Secondary | ICD-10-CM | POA: Diagnosis not present

## 2022-07-31 DIAGNOSIS — M533 Sacrococcygeal disorders, not elsewhere classified: Secondary | ICD-10-CM | POA: Diagnosis not present

## 2022-07-31 DIAGNOSIS — G9050 Complex regional pain syndrome I, unspecified: Secondary | ICD-10-CM

## 2022-07-31 DIAGNOSIS — G894 Chronic pain syndrome: Secondary | ICD-10-CM

## 2022-07-31 DIAGNOSIS — G5703 Lesion of sciatic nerve, bilateral lower limbs: Secondary | ICD-10-CM | POA: Diagnosis not present

## 2022-07-31 DIAGNOSIS — M47812 Spondylosis without myelopathy or radiculopathy, cervical region: Secondary | ICD-10-CM | POA: Diagnosis present

## 2022-07-31 DIAGNOSIS — T85192S Other mechanical complication of implanted electronic neurostimulator (electrode) of spinal cord, sequela: Secondary | ICD-10-CM

## 2022-07-31 MED ORDER — FENTANYL 50 MCG/HR TD PT72: 1.0000 | MEDICATED_PATCH | TRANSDERMAL | 0 refills | Status: AC

## 2022-07-31 MED ORDER — FENTANYL 50 MCG/HR TD PT72: 1.0000 | MEDICATED_PATCH | TRANSDERMAL | 0 refills | Status: DC

## 2022-07-31 NOTE — Patient Instructions (Signed)
____________________________________________________________________________________________  General Risks and Possible Complications  Patient Responsibilities: It is important that you read this as it is part of your informed consent. It is our duty to inform you of the risks and possible complications associated with treatments offered to you. It is your responsibility as a patient to read this and to ask questions about anything that is not clear or that you believe was not covered in this document.  Patient's Rights: You have the right to refuse treatment. You also have the right to change your mind, even after initially having agreed to have the treatment done. However, under this last option, if you wait until the last second to change your mind, you may be charged for the materials used up to that point.  Introduction: Medicine is not an exact science. Everything in Medicine, including the lack of treatment(s), carries the potential for danger, harm, or loss (which is by definition: Risk). In Medicine, a complication is a secondary problem, condition, or disease that can aggravate an already existing one. All treatments carry the risk of possible complications. The fact that a side effects or complications occurs, does not imply that the treatment was conducted incorrectly. It must be clearly understood that these can happen even when everything is done following the highest safety standards.  No treatment: You can choose not to proceed with the proposed treatment alternative. The "PRO(s)" would include: avoiding the risk of complications associated with the therapy. The "CON(s)" would include: not getting any of the treatment benefits. These benefits fall under one of three categories: diagnostic; therapeutic; and/or palliative. Diagnostic benefits include: getting information which can ultimately lead to improvement of the disease or symptom(s). Therapeutic benefits are those associated with  the successful treatment of the disease. Finally, palliative benefits are those related to the decrease of the primary symptoms, without necessarily curing the condition (example: decreasing the pain from a flare-up of a chronic condition, such as incurable terminal cancer).  General Risks and Complications: These are associated to most interventional treatments. They can occur alone, or in combination. They fall under one of the following six (6) categories: no benefit or worsening of symptoms; bleeding; infection; nerve damage; allergic reactions; and/or death. No benefits or worsening of symptoms: In Medicine there are no guarantees, only probabilities. No healthcare provider can ever guarantee that a medical treatment will work, they can only state the probability that it may. Furthermore, there is always the possibility that the condition may worsen, either directly, or indirectly, as a consequence of the treatment. Bleeding: This is more common if the patient is taking a blood thinner, either prescription or over the counter (example: Goody Powders, Fish oil, Aspirin, Garlic, etc.), or if suffering a condition associated with impaired coagulation (example: Hemophilia, cirrhosis of the liver, low platelet counts, etc.). However, even if you do not have one on these, it can still happen. If you have any of these conditions, or take one of these drugs, make sure to notify your treating physician. Infection: This is more common in patients with a compromised immune system, either due to disease (example: diabetes, cancer, human immunodeficiency virus [HIV], etc.), or due to medications or treatments (example: therapies used to treat cancer and rheumatological diseases). However, even if you do not have one on these, it can still happen. If you have any of these conditions, or take one of these drugs, make sure to notify your treating physician. Nerve Damage: This is more common when the treatment is an    invasive one, but it can also happen with the use of medications, such as those used in the treatment of cancer. The damage can occur to small secondary nerves, or to large primary ones, such as those in the spinal cord and brain. This damage may be temporary or permanent and it may lead to impairments that can range from temporary numbness to permanent paralysis and/or brain death. Allergic Reactions: Any time a substance or material comes in contact with our body, there is the possibility of an allergic reaction. These can range from a mild skin rash (contact dermatitis) to a severe systemic reaction (anaphylactic reaction), which can result in death. Death: In general, any medical intervention can result in death, most of the time due to an unforeseen complication. ____________________________________________________________________________________________   OnabotulinumtoxinA Injection (Medical Use) What is this medication? ONABOTULINUMTOXINA (o na BOTT you lye num tox in eh) treats severe muscle spasms. It may also be used to prevent migraine headaches. It can treat excessive sweating when other medications do not work well enough. This medicine may be used for other purposes; ask your health care provider or pharmacist if you have questions. COMMON BRAND NAME(S): Botox What should I tell my care team before I take this medication? They need to know if you have any of these conditions: Breathing problems Cerebral palsy spasms Difficulty urinating Heart problems History of surgery where this medication is going to be used Infection at the site where this medication is going to be used Myasthenia gravis or other neurologic disease Nerve or muscle disease Surgery plans Take medications that treat or prevent blood clots Thyroid problems An unusual or allergic reaction to botulinum toxin, albumin, other medications, foods, dyes, or preservatives Pregnant or trying to get  pregnant Breast-feeding How should I use this medication? This medication is for injected into a muscle. It is given by your care team in a hospital or clinic setting. A special MedGuide will be given to you before each treatment. Be sure to read this information carefully each time. Talk to your care team about the use of this medication in children. While this medication may be prescribed for children as young as 2 years for selected conditions, precautions do apply. Overdosage: If you think you have taken too much of this medicine contact a poison control center or emergency room at once. NOTE: This medicine is only for you. Do not share this medicine with others. What if I miss a dose? This does not apply. What may interact with this medication? Aminoglycoside antibiotics, such as gentamicin, neomycin, tobramycin Muscle relaxants Other botulinum toxin injections This list may not describe all possible interactions. Give your health care provider a list of all the medicines, herbs, non-prescription drugs, or dietary supplements you use. Also tell them if you smoke, drink alcohol, or use illegal drugs. Some items may interact with your medicine. What should I watch for while using this medication? Visit your care team for regular check ups. This medication will cause weakness in the muscle where it is injected. Tell your care team if you feel unusually weak in other muscles. Get medical help right away if you have problems with breathing, swallowing, or talking. This medication might make your eyelids droop or make you see blurry or double. If you have weak muscles or trouble seeing do not drive a car, use machinery, or do other dangerous activities. This medication contains albumin from human blood. It may be possible to pass an infection in this medication, but no  cases have been reported. Talk to your care team about the risks and benefits of this medication. If your activities have been  limited by your condition, go back to your regular routine slowly after treatment with this medication. What side effects may I notice from receiving this medication? Side effects that you should report to your care team as soon as possible: Allergic reactions--skin rash, itching, hives, swelling of the face, lips, tongue, or throat Dryness or irritation of the eyes, eye pain, change in vision, sensitivity to light Infection--fever, chills, cough, sore throat, wounds that don't heal, pain or trouble when passing urine, general feeling of discomfort or being unwell Spread of botulinum toxin effects--unusual weakness or fatigue, blurry or double vision, trouble swallowing, hoarseness or trouble speaking, trouble breathing, loss of bladder control Trouble passing urine Side effects that usually do not require medical attention (report these to your care team if they continue or are bothersome): Dry mouth Eyelid drooping Fatigue Headache Pain, redness, or irritation at injection site This list may not describe all possible side effects. Call your doctor for medical advice about side effects. You may report side effects to FDA at 1-800-FDA-1088. Where should I keep my medication? This medication is given in a hospital or clinic and will not be stored at home. NOTE: This sheet is a summary. It may not cover all possible information. If you have questions about this medicine, talk to your doctor, pharmacist, or health care provider.  2023 [employer] Standard (2021-04-12 00:00:00)

## 2022-07-31 NOTE — Progress Notes (Signed)
Nursing Pain Medication Assessment:  Safety precautions to be maintained throughout the outpatient stay will include: orient to surroundings, keep bed in low position, maintain call bell within reach at all times, provide assistance with transfer out of bed and ambulation.  Medication Inspection Compliance: Lindsay Austin did not comply with our request to bring her pills to be counted. She was reminded that bringing the medication bottles, even when empty, is a requirement.  Medication: None brought in. Pill/Patch Count: None available to be counted. Bottle Appearance: No container available. Did not bring bottle(s) to appointment. Filled Date: N/A Last Medication intake:  Today 

## 2022-07-31 NOTE — Progress Notes (Signed)
PROVIDER NOTE: Information contained herein reflects review and annotations entered in association with encounter. Interpretation of such information and data should be left to medically-trained personnel. Information provided to patient can be located elsewhere in the medical record under "Patient Instructions". Document created using STT-dictation technology, any transcriptional errors that may result from process are unintentional.    Patient: Lindsay Austin  Service Category: E/M  Provider: [name], MD  DOB: 27-Aug-1960  DOS: 07/31/2022  Specialty: Interventional Pain Management  MRN: [id number]  Setting: Ambulatory outpatient  PCP: Lindsay Plumber, PA  Type: Established Patient    Referring Provider: [name], PA  Location: Office  Delivery: Face-to-face     HPI  [name], a 62 y.o. year old female, is here today because of her Sacroiliac joint dysfunction of right side [M53.3]. [name] primary complain today is Back Pain (Lumbar bilateral ), Leg Pain (Bilateral ), and Knee Injury (Left knee, fell and that created a hematoma )  Last encounter: My last encounter with her was on 05/07/22  Pertinent problems: [name] has Hx of lumbosacral spine surgery; History of lumbar fusion (L5-S1 fusion); Failed spinal cord stimulator, sequela; Cervical fusion syndrome (C4-C7); Chronic pain syndrome; Major depressive disorder with current active episode; Lumbar spondylosis; Cervical spondylosis without myelopathy; and Spinal stenosis in cervical region on their pertinent problem list. Pain Assessment: Severity of Chronic pain is reported as a 4 /10. Location: Back Lower, Left, Right/? back pain into lungs.. Onset: More than a month ago. Quality: Discomfort, Burning, Other (Comment), Aching, Constant (stinging). Timing: Constant. Modifying factor(s): rest, patches. Vitals:  height is 5\' 4"  (1.626 m) and weight is 173 lb (78.5 kg). Her temporal temperature is 97.2 F (36.2 C) (abnormal). Her blood  pressure is 88/64 (abnormal) and her pulse is 101 (abnormal). Her respiration is 16 and oxygen saturation is 94%.   Reason for encounter: medication management.    Patient presents today for medication management.  Patient unfortunately had a TIA as well as a fall and injured her left knee.  She states that she was admitted to the hospital for a left knee hematoma and then subsequently went on to rehab.  She states that it has been a rough course.  She is also recovering from a an upper respiratory illness and has a cough.  She has chronic low back and bilateral leg pain.  We will refill her fentanyl patch as below, no change in dose.  She has also been struggling with migraines, more than 15 migraines a month and would like to repeat Botox which was done more than 6 months ago.   Pharmacotherapy Assessment  Analgesic:  fentanyl patch  50 mcg an hour; 120 MME.   Monitoring: [place] PMP: PDMP reviewed during this encounter.       Pharmacotherapy: No side-effects or adverse reactions reported. Compliance: No problems identified. Effectiveness: Clinically acceptable.  UDS:  Summary  Date Value Ref Range Status  05/07/2022 Note  Final    Comment:    ==================================================================== [employer] Select 13 (MW) ==================================================================== Test                             Result       Flag       Units  Drug Present and Declared for Prescription Verification   Fentanyl                       148  EXPECTED   ng/mg creat   Norfentanyl                    632          EXPECTED   ng/mg creat    Source of fentanyl is a scheduled prescription medication, including    IV, patch, and transmucosal formulations. Norfentanyl is an expected    metabolite of fentanyl.  ==================================================================== Test                      Result    Flag   Units      Ref Range   Creatinine               71               mg/dL      >=20 ==================================================================== Declared Medications:  The flagging and interpretation on this report are based on the  following declared medications.  Unexpected results may arise from  inaccuracies in the declared medications.   **Note: The testing scope of this panel includes these medications:   Fentanyl (Duragesic)   **Note: The testing scope of this panel does not include the  following reported medications:   Albuterol (Ventolin HFA)  Bupropion (Wellbutrin)  Clopidogrel (Plavix)  Dicyclomine (Bentyl)  Methocarbamol (Robaxin)  Naloxone (Narcan)  Ondansetron (Zofran)  Pregabalin (Lyrica)  Risperidone (Risperdal)  Rizatriptan (Maxalt)  Sucralfate (Carafate)  Trazodone (Desyrel) ==================================================================== For clinical consultation, please call [phone]. ====================================================================       ROS  Constitutional: Denies any fever or chills Gastrointestinal: No reported hemesis, hematochezia, vomiting, or acute GI distress Musculoskeletal: Low back pain Neurological:  Migraines  Medication Review  DSS, albuterol, atorvastatin, buPROPion, clopidogrel, dicyclomine, fentaNYL, methocarbamol, naloxone, ondansetron, pregabalin, risperiDONE, rizatriptan, sucralfate, and traZODone  History Review  Allergy: [name] is allergic to methadone, other, and tape. Drug: [name]  reports no history of drug use. Alcohol:  reports no history of alcohol use. Tobacco:  reports that she has never smoked. She has never used smokeless tobacco. Social: [name]  reports that she has never smoked. She has never used smokeless tobacco. She reports that she does not drink alcohol and does not use drugs. Medical:  has a past medical history of Anxiety, Arthritis, Bladder cancer, Cancer of kidney, DDD (degenerative disc disease), cervical,  Depression, Failed back syndrome, Fibromyalgia, GERD (gastroesophageal reflux disease), Headache, Heart murmur, Hyperlipemia, Hypertension, IBS (irritable bowel syndrome), Kidney failure, Renal insufficiency, RSD (reflex sympathetic dystrophy), Seizure, Stomach ulcer, and Trigeminal neuralgia. Surgical: [name]  has a past surgical history that includes Transurethral resection of bladder tumor with gyrus (turbt-gyrus) (2012, 2013); Nephroureterectomy (2011); Back surgery (1995); Cholecystectomy; Brain surgery (2004); trigeminal neuralgia (2004); Spinal cord stimulator implant (2004); colonscopy (2006); Posterior cervical fusion/foraminotomy (Bilateral, 07/05/2019); Posterior cervical laminectomy (Bilateral, 07/05/2019); and Breast biopsy (Left, 2017). Family: family history includes Dementia in her father; Diabetes in her mother; Heart disease in her mother; Kidney disease in her father.  Laboratory Chemistry Profile   Renal Lab Results  Component Value Date   BUN 13 12/29/2020   CREATININE 1.44 (H) 12/29/2020   LABCREA 71 08/29/2019   GFRAA 51 (L) 08/31/2019   GFRNONAA 42 (L) 12/29/2020     Hepatic Lab Results  Component Value Date   AST 32 08/29/2019   ALT 12 08/29/2019   ALBUMIN 4.1 08/29/2019   ALKPHOS 66 08/29/2019     Electrolytes Lab Results  Component Value Date  NA 139 12/29/2020   K 4.3 12/29/2020   CL 101 12/29/2020   CALCIUM 9.2 12/29/2020   MG 2.2 08/31/2019     Bone No results found for: "VD25OH", "VD125OH2TOT", "[id number]", "UK:[id number]", "25OHVITD1", "25OHVITD2", "25OHVITD3", "TESTOFREE", "TESTOSTERONE"   Inflammation (CRP: Acute Phase) (ESR: Chronic Phase) No results found for: "CRP", "ESRSEDRATE", "LATICACIDVEN"     Note: Above Lab results reviewed.   Physical Exam  General appearance: Well nourished, well developed, and well hydrated. In no apparent acute distress Mental status: Alert, oriented x 3 (person, place, & time)       Respiratory: No evidence of  acute respiratory distress Eyes: PERLA Vitals: BP (!) 88/64 (BP Location: Left Arm, Patient Position: Sitting, Cuff Size: Normal)   Pulse (!) 101   Temp (!) 97.2 F (36.2 C) (Temporal)   Resp 16   Ht 5\' 4"  (1.626 m)   Wt 173 lb (78.5 kg)   SpO2 94%   BMI 29.70 kg/m  BMI: Estimated body mass index is 29.7 kg/m as calculated from the following:   Height as of this encounter: 5\' 4"  (1.626 m).   Weight as of this encounter: 173 lb (78.5 kg). Ideal: Ideal body weight: 54.7 kg (120 lb 9.5 oz) Adjusted ideal body weight: 64.2 kg (141 lb 8.9 oz)  Lumbar Spine Area Exam  Skin & Axial Inspection: Well healed scar from previous spine surgery detected Alignment: Symmetrical Functional ROM: Pain restricted ROM affecting primarily the right Stability: No instability detected Muscle Tone/Strength: Functionally intact. No obvious neuro-muscular anomalies detected. Sensory (Neurological): Musculoskeletal pain pattern    Gait & Posture Assessment  Ambulation: antalgic Gait: Limited. Using assistive device to ambulate Posture: Difficulty standing up straight, due to pain    Lower Extremity Exam      Side: Right lower extremity   Side: Left lower extremity  Stability: No instability observed           Stability: No instability observed          Skin & Extremity Inspection: Skin color, temperature, and hair growth are WNL. No peripheral edema or cyanosis. No masses, redness, swelling, asymmetry, or associated skin lesions. No contractures.   Skin & Extremity Inspection: Skin color, temperature, and hair growth are WNL. No peripheral edema or cyanosis. No masses, redness, swelling, asymmetry, or associated skin lesions. No contractures.  Functional ROM: Pain restricted ROM for all joints of the lower extremity           Functional ROM: Pain restricted ROM for all joints of the lower extremity          Muscle Tone/Strength: Functionally intact. No obvious neuro-muscular anomalies detected.   Muscle  Tone/Strength: Functionally intact. No obvious neuro-muscular anomalies detected.  Sensory (Neurological): Unimpaired         Sensory (Neurological): Unimpaired        DTR: Patellar: deferred today Achilles: deferred today Plantar: deferred today   DTR: Patellar: deferred today Achilles: deferred today Plantar: deferred today  Palpation: No palpable anomalies   Palpation: No palpable anomalies       Assessment   Status Diagnosis  Controlled Controlled Having a Flare-up 1. Sacroiliac joint dysfunction of right side   2. Sacroiliac joint dysfunction of left side   3. Chronic migraine without aura without status migrainosus, not intractable   4. Piriformis syndrome of both sides   5. Failed spinal cord stimulator, sequela   6. History of lumbar fusion   7. Chronic pain syndrome   8. Cervical spondylosis  without myelopathy   9. Spinal stenosis in cervical region   10. Lumbar spondylosis   11. Cervical fusion syndrome (C4-T2)   12. RSD (reflex sympathetic dystrophy)   13. Lumbar facet arthropathy   14. Cervical facet joint syndrome   15. Piriformis syndrome of right side   16. Contusion of right hip, sequela        Plan of Care  [name] has a current medication list which includes the following long-term medication(s): albuterol, atorvastatin, bupropion, dicyclomine, risperidone, rizatriptan, sucralfate, and trazodone.  1. Sacroiliac joint dysfunction of right side - fentaNYL (DURAGESIC) 50 MCG/HR; Place 1 patch onto the skin every 3 (three) days. Most last 30 days.  Dispense: 10 patch; Refill: 0 - fentaNYL (DURAGESIC) 50 MCG/HR; Place 1 patch onto the skin every 3 (three) days. Most last 30 days.  Dispense: 10 patch; Refill: 0 - fentaNYL (DURAGESIC) 50 MCG/HR; Place 1 patch onto the skin every 3 (three) days. Most last 30 days.  Dispense: 10 patch; Refill: 0  2. Sacroiliac joint dysfunction of left side  3. Chronic migraine without aura without status migrainosus,  not intractable - Chemo Denervation; Future  4. Piriformis syndrome of both sides  5. Failed spinal cord stimulator, sequela - fentaNYL (DURAGESIC) 50 MCG/HR; Place 1 patch onto the skin every 3 (three) days. Most last 30 days.  Dispense: 10 patch; Refill: 0 - fentaNYL (DURAGESIC) 50 MCG/HR; Place 1 patch onto the skin every 3 (three) days. Most last 30 days.  Dispense: 10 patch; Refill: 0 - fentaNYL (DURAGESIC) 50 MCG/HR; Place 1 patch onto the skin every 3 (three) days. Most last 30 days.  Dispense: 10 patch; Refill: 0  6. History of lumbar fusion - fentaNYL (DURAGESIC) 50 MCG/HR; Place 1 patch onto the skin every 3 (three) days. Most last 30 days.  Dispense: 10 patch; Refill: 0 - fentaNYL (DURAGESIC) 50 MCG/HR; Place 1 patch onto the skin every 3 (three) days. Most last 30 days.  Dispense: 10 patch; Refill: 0 - fentaNYL (DURAGESIC) 50 MCG/HR; Place 1 patch onto the skin every 3 (three) days. Most last 30 days.  Dispense: 10 patch; Refill: 0  7. Chronic pain syndrome - fentaNYL (DURAGESIC) 50 MCG/HR; Place 1 patch onto the skin every 3 (three) days. Most last 30 days.  Dispense: 10 patch; Refill: 0 - fentaNYL (DURAGESIC) 50 MCG/HR; Place 1 patch onto the skin every 3 (three) days. Most last 30 days.  Dispense: 10 patch; Refill: 0 - fentaNYL (DURAGESIC) 50 MCG/HR; Place 1 patch onto the skin every 3 (three) days. Most last 30 days.  Dispense: 10 patch; Refill: 0  8. Cervical spondylosis without myelopathy - fentaNYL (DURAGESIC) 50 MCG/HR; Place 1 patch onto the skin every 3 (three) days. Most last 30 days.  Dispense: 10 patch; Refill: 0 - fentaNYL (DURAGESIC) 50 MCG/HR; Place 1 patch onto the skin every 3 (three) days. Most last 30 days.  Dispense: 10 patch; Refill: 0 - fentaNYL (DURAGESIC) 50 MCG/HR; Place 1 patch onto the skin every 3 (three) days. Most last 30 days.  Dispense: 10 patch; Refill: 0  9. Spinal stenosis in cervical region - fentaNYL (DURAGESIC) 50 MCG/HR; Place 1 patch onto  the skin every 3 (three) days. Most last 30 days.  Dispense: 10 patch; Refill: 0 - fentaNYL (DURAGESIC) 50 MCG/HR; Place 1 patch onto the skin every 3 (three) days. Most last 30 days.  Dispense: 10 patch; Refill: 0 - fentaNYL (DURAGESIC) 50 MCG/HR; Place 1 patch onto the skin every  3 (three) days. Most last 30 days.  Dispense: 10 patch; Refill: 0  10. Lumbar spondylosis - fentaNYL (DURAGESIC) 50 MCG/HR; Place 1 patch onto the skin every 3 (three) days. Most last 30 days.  Dispense: 10 patch; Refill: 0 - fentaNYL (DURAGESIC) 50 MCG/HR; Place 1 patch onto the skin every 3 (three) days. Most last 30 days.  Dispense: 10 patch; Refill: 0 - fentaNYL (DURAGESIC) 50 MCG/HR; Place 1 patch onto the skin every 3 (three) days. Most last 30 days.  Dispense: 10 patch; Refill: 0  11. Cervical fusion syndrome (C4-T2) - fentaNYL (DURAGESIC) 50 MCG/HR; Place 1 patch onto the skin every 3 (three) days. Most last 30 days.  Dispense: 10 patch; Refill: 0 - fentaNYL (DURAGESIC) 50 MCG/HR; Place 1 patch onto the skin every 3 (three) days. Most last 30 days.  Dispense: 10 patch; Refill: 0 - fentaNYL (DURAGESIC) 50 MCG/HR; Place 1 patch onto the skin every 3 (three) days. Most last 30 days.  Dispense: 10 patch; Refill: 0  12. RSD (reflex sympathetic dystrophy) - fentaNYL (DURAGESIC) 50 MCG/HR; Place 1 patch onto the skin every 3 (three) days. Most last 30 days.  Dispense: 10 patch; Refill: 0 - fentaNYL (DURAGESIC) 50 MCG/HR; Place 1 patch onto the skin every 3 (three) days. Most last 30 days.  Dispense: 10 patch; Refill: 0 - fentaNYL (DURAGESIC) 50 MCG/HR; Place 1 patch onto the skin every 3 (three) days. Most last 30 days.  Dispense: 10 patch; Refill: 0  13. Lumbar facet arthropathy - fentaNYL (DURAGESIC) 50 MCG/HR; Place 1 patch onto the skin every 3 (three) days. Most last 30 days.  Dispense: 10 patch; Refill: 0 - fentaNYL (DURAGESIC) 50 MCG/HR; Place 1 patch onto the skin every 3 (three) days. Most last 30 days.   Dispense: 10 patch; Refill: 0 - fentaNYL (DURAGESIC) 50 MCG/HR; Place 1 patch onto the skin every 3 (three) days. Most last 30 days.  Dispense: 10 patch; Refill: 0  14. Cervical facet joint syndrome - fentaNYL (DURAGESIC) 50 MCG/HR; Place 1 patch onto the skin every 3 (three) days. Most last 30 days.  Dispense: 10 patch; Refill: 0 - fentaNYL (DURAGESIC) 50 MCG/HR; Place 1 patch onto the skin every 3 (three) days. Most last 30 days.  Dispense: 10 patch; Refill: 0 - fentaNYL (DURAGESIC) 50 MCG/HR; Place 1 patch onto the skin every 3 (three) days. Most last 30 days.  Dispense: 10 patch; Refill: 0  15. Piriformis syndrome of right side - fentaNYL (DURAGESIC) 50 MCG/HR; Place 1 patch onto the skin every 3 (three) days. Most last 30 days.  Dispense: 10 patch; Refill: 0 - fentaNYL (DURAGESIC) 50 MCG/HR; Place 1 patch onto the skin every 3 (three) days. Most last 30 days.  Dispense: 10 patch; Refill: 0 - fentaNYL (DURAGESIC) 50 MCG/HR; Place 1 patch onto the skin every 3 (three) days. Most last 30 days.  Dispense: 10 patch; Refill: 0  16. Contusion of right hip, sequela - fentaNYL (DURAGESIC) 50 MCG/HR; Place 1 patch onto the skin every 3 (three) days. Most last 30 days.  Dispense: 10 patch; Refill: 0 - fentaNYL (DURAGESIC) 50 MCG/HR; Place 1 patch onto the skin every 3 (three) days. Most last 30 days.  Dispense: 10 patch; Refill: 0 - fentaNYL (DURAGESIC) 50 MCG/HR; Place 1 patch onto the skin every 3 (three) days. Most last 30 days.  Dispense: 10 patch; Refill: 0      Pharmacotherapy (Medications Ordered): Meds ordered this encounter  Medications   fentaNYL (DURAGESIC) 50 MCG/HR  Sig: Place 1 patch onto the skin every 3 (three) days. Most last 30 days.    Dispense:  10 patch    Refill:  0    [place] STOP ACT - Not applicable. Fill one day early if pharmacy is closed on scheduled refill date.   fentaNYL (DURAGESIC) 50 MCG/HR    Sig: Place 1 patch onto the skin every 3 (three) days. Most last 30  days.    Dispense:  10 patch    Refill:  0    [place] STOP ACT - Not applicable. Fill one day early if pharmacy is closed on scheduled refill date.   fentaNYL (DURAGESIC) 50 MCG/HR    Sig: Place 1 patch onto the skin every 3 (three) days. Most last 30 days.    Dispense:  10 patch    Refill:  0    [place] STOP ACT - Not applicable. Fill one day early if pharmacy is closed on scheduled refill date.   Follow-up for Botox for migraine management  Orders Placed This Encounter  Procedures   Chemo Denervation    Standing Status:   Future    Standing Expiration Date:   07/31/2023    Scheduling Instructions:     Botox for migraine   Lyrica managed by neurology: 100 mg TID   Follow-up plan:   Return in about 4 months (around 11/19/2022) for Medication Management, in person.   Recent Visits Date Type Provider Dept  05/07/22 Office Visit [name], MD [facility]  Showing recent visits within past 90 days and meeting all other requirements Today's Visits Date Type Provider Dept  07/31/22 Office Visit [name], MD [facility]  Showing today's visits and meeting all other requirements Future Appointments No visits were found meeting these conditions. Showing future appointments within next 90 days and meeting all other requirements  I discussed the assessment and treatment plan with the patient. The patient was provided an opportunity to ask questions and all were answered. The patient agreed with the plan and demonstrated an understanding of the instructions.  Patient advised to call back or seek an in-person evaluation if the symptoms or condition worsens.  Duration of encounter: 35 minutes.  Note by: [name], MD Date: 07/31/2022; Time: 2:50 PM

## 2022-08-01 ENCOUNTER — Encounter: Payer: Medicaid Other | Admitting: Student in an Organized Health Care Education/Training Program

## 2022-09-09 ENCOUNTER — Ambulatory Visit
Payer: Medicaid Other | Attending: Student in an Organized Health Care Education/Training Program | Admitting: Student in an Organized Health Care Education/Training Program

## 2022-09-09 ENCOUNTER — Encounter: Payer: Self-pay | Admitting: Student in an Organized Health Care Education/Training Program

## 2022-09-09 VITALS — BP 103/77 | HR 58 | Temp 97.9°F | Resp 17 | Ht 64.0 in | Wt 173.0 lb

## 2022-09-09 DIAGNOSIS — G43709 Chronic migraine without aura, not intractable, without status migrainosus: Secondary | ICD-10-CM | POA: Diagnosis not present

## 2022-09-09 MED ORDER — SODIUM CHLORIDE (PF) 0.9 % IJ SOLN
INTRAMUSCULAR | Status: AC
  Filled 2022-09-09: qty 10

## 2022-09-09 MED ORDER — ONABOTULINUMTOXINA 200 UNITS IJ SOLR
155.0000 [IU] | Freq: Once | INTRAMUSCULAR | Status: AC
  Administered 2022-09-09: 200 [IU] via INTRAMUSCULAR
  Filled 2022-09-09: qty 200

## 2022-09-09 NOTE — Progress Notes (Signed)
Safety precautions to be maintained throughout the outpatient stay will include: orient to surroundings, keep bed in low position, maintain call bell within reach at all times, provide assistance with transfer out of bed and ambulation.  

## 2022-09-09 NOTE — Progress Notes (Signed)
PROVIDER NOTE: Information contained herein reflects review and annotations entered in association with encounter. Interpretation of such information and data should be left to medically-trained personnel. Information provided to patient can be located elsewhere in the medical record under "Patient Instructions". Document created using STT-dictation technology, any transcriptional errors that may result from process are unintentional.    Patient: Lindsay Austin  Service Category: Procedure  Provider: Edward Jolly, MD  DOB: 20-Jun-1960  DOS: 09/09/2022  Location: [facility]  MRN: [id number]  Setting: Ambulatory - outpatient  Referring Provider: Shane Crutch, PA  Type: Established Patient  Specialty: Interventional Pain Management  PCP: Shane Crutch, PA   Primary Reason for Visit: Interventional Pain Management Treatment. CC: migraine     Procedure:            Botox for migraine management #5            1. Chronic migraine without aura without status migrainosus, not intractable     Pain Score: Pre-procedure: 3 /10 Post-procedure: 3 /10     Pre-op H&P Assessment:  Lindsay Austin is a 62 y.o. (year old), female patient, seen today for interventional treatment. 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 Lindsay Austin has a current medication list which includes the following prescription(s): albuterol, atorvastatin, bupropion, clopidogrel, dicyclomine, dss, fentanyl, [START ON 09/20/2022] fentanyl, [START ON 10/20/2022] fentanyl, methocarbamol, naloxone, ondansetron, pregabalin, risperidone,  rizatriptan, sucralfate, and trazodone. Her primarily concern today is the Back Pain (Right hip pain)   Initial Vital Signs:  Pulse/HCG Rate: (!) 59  Temp: 97.9 F (36.6 C) Resp: 17 BP: 122/74 SpO2: 99 %  BMI: Estimated body mass index is 29.7 kg/m as calculated from the following:   Height as of this encounter: 5\' 4"  (1.626 m).   Weight as of this encounter: 173 lb (78.5 kg).  Risk Assessment: Allergies: Reviewed. She is allergic to methadone, other, and tape.  Allergy Precautions: None required Coagulopathies: Reviewed. None identified.  Blood-thinner therapy: None at this time Active Infection(s): Reviewed. None identified. [name] is afebrile  Site Confirmation: Lindsay Austin was asked to confirm the procedure and laterality before marking the site Procedure checklist: Completed Consent: Before the procedure and under the influence of no sedative(s), amnesic(s), or anxiolytics, the patient was informed of the treatment options, risks and possible complications. To fulfill our ethical and legal obligations, as recommended by the American Medical Association's Code of Ethics, I have informed the patient of my clinical impression; the nature and purpose of the treatment or procedure; the risks, benefits, and possible complications of the intervention; the alternatives, including doing nothing; the risk(s) and benefit(s) of the alternative treatment(s) or procedure(s); and the risk(s) and benefit(s) of doing nothing. The patient was provided information about the general risks and possible complications associated with the procedure. These may include, but are not limited to: failure to achieve desired goals, infection, bleeding, organ or nerve damage, allergic reactions, paralysis, and death. In addition, the patient was informed of those risks and complications associated to the procedure, such as failure to decrease pain; infection; bleeding; organ or nerve damage with subsequent damage to  sensory, motor, and/or autonomic systems, resulting in permanent pain, numbness, and/or weakness of one or several areas  of the body; allergic reactions; (i.e.: anaphylactic reaction); and/or death. Furthermore, the patient was informed of those risks and complications associated with the medications. These include, but are not limited to: allergic reactions (i.e.: anaphylactic or anaphylactoid reaction(s)); adrenal axis suppression; blood sugar elevation that in diabetics may result in ketoacidosis or comma; water retention that in patients with history of congestive heart failure may result in shortness of breath, pulmonary edema, and decompensation with resultant heart failure; weight gain; swelling or edema; medication-induced neural toxicity; particulate matter embolism and blood vessel occlusion with resultant organ, and/or nervous system infarction; and/or aseptic necrosis of one or more joints. Finally, the patient was informed that Medicine is not an exact science; therefore, there is also the possibility of unforeseen or unpredictable risks and/or possible complications that may result in a catastrophic outcome. The patient indicated having understood very clearly. We have given the patient no guarantees and we have made no promises. Enough time was given to the patient to ask questions, all of which were answered to the patient's satisfaction. Lindsay Austin has indicated that she wanted to continue with the procedure. Attestation: I, the ordering provider, attest that I have discussed with the patient the benefits, risks, side-effects, alternatives, likelihood of achieving goals, and potential problems during recovery for the procedure that I have provided informed consent. Date  Time: 09/09/2022 10:12 AM  Pre-Procedure Preparation:  Monitoring: As per clinic protocol. Respiration, ETCO2, SpO2, BP, heart rate and rhythm monitor placed and checked for adequate function Safety Precautions: Patient was  assessed for positional comfort and pressure points before starting the procedure. Time-out: I initiated and conducted the "Time-out" before starting the procedure, as per protocol. The patient was asked to participate by confirming the accuracy of the "Time Out" information. Verification of the correct person, site, and procedure were performed and confirmed by me, the nursing staff, and the patient. "Time-out" conducted as per Joint Commission's Universal Protocol (UP.01.01.01). Time: 1130  Constitution and injection details as below:   1 x 200 unit vial of Botox brand onabotulinum toxin A was diluted with 4 mL preservative free saline, for a final concentration of 5 units/0.1 mL. Injection sites were prepared for injection with alcohol swab  Botox was injected as follows:  5 units left corrugator 5 units right corrugator 5 units procerus  5 units in 2 sites, right frontalis (10 units total) 5units in 2 sites, left frontalis (10 units total) 5 units in each of 4 sites right temporalis (20 units total) 5 units in each of 4 sites left temporalis (20 units total) 5 units in each of 3 sites right occipitalis (15 units total) 5 units in each of 3 sites left occipitalis (15 units total) 5 units in each of 2 sites right cervical paraspinal at approximate C3-4 level (10 units total) 5 units in each of 2 sites left cervical paraspinal muscles at approximate C3-4 level (10 units total) 5 units in each of 3 sites right trapezius (15 units total) 5 units in each of 3 sites left trapezius (15 units total)  45 units were wasted  A total of 155 units was administered by Dr Cherylann Ratel        Post-operative Assessment:  Post-procedure Vital Signs:  Pulse/HCG Rate: (!) 58  Temp: 97.9 F (36.6 C) Resp: 17 BP: 103/77 SpO2: 100 %  EBL: None  Complications: No immediate post-treatment complications observed by team, or reported by patient.  Note: The patient tolerated the entire procedure  well. A repeat set  of vitals were taken after the procedure and the patient was kept under observation following institutional policy, for this type of procedure. Post-procedural neurological assessment was performed, showing return to baseline, prior to discharge. The patient was provided with post-procedure discharge instructions, including a section on how to identify potential problems. Should any problems arise concerning this procedure, the patient was given instructions to immediately contact us, at any time, without hesitation. In any case, we plan to contact the patient by telephone for a follow-up status report regarding this interventional procedure.  Comments:  No additional relevant information.  Plan of Care   Chronic Opioid Analgesic:   fentanyl patch  50 mcg an hour; 120 MME.   Medications ordered for procedure: Meds ordered this encounter  Medications   botulinum toxin Type A (BOTOX) injection 155 Units   Medications administered: We administered botulinum toxin Type A.  See the medical record for exact dosing, route, and time of administration.  Follow-up plan:   Return for Keep sch. appt.     Recent Visits Date Type Provider Dept  07/31/22 Office Visit Edward Jolly, MD [facility]  Showing recent visits within past 90 days and meeting all other requirements Today's Visits Date Type Provider Dept  09/09/22 Procedure visit Edward Jolly, MD [facility]  Showing today's visits and meeting all other requirements Future Appointments Date Type Provider Dept  10/29/22 Appointment Edward Jolly, MD [facility]  Showing future appointments within next 90 days and meeting all other requirements  Disposition: Discharge home  Discharge (Date  Time): 09/09/2022; 1143 hrs.   Primary Care Physician: Shane Crutch, [place] Location: [facility] Note by: Edward Jolly, MD Date: 09/09/2022; Time: 1:32 PM  Disclaimer:   Medicine is not an exact science. The only guarantee in medicine is that nothing is guaranteed. It is important to note that the decision to proceed with this intervention was based on the information collected from the patient. The Data and conclusions were drawn from the patient's questionnaire, the interview, and the physical examination. Because the information was provided in large part by the patient, it cannot be guaranteed that it has not been purposely or unconsciously manipulated. Every effort has been made to obtain as much relevant data as possible for this evaluation. It is important to note that the conclusions that lead to this procedure are derived in large part from the available data. Always take into account that the treatment will also be dependent on availability of resources and existing treatment guidelines, considered by other Pain Management Practitioners as being common knowledge and practice, at the time of the intervention. For Medico-Legal purposes, it is also important to point out that variation in procedural techniques and pharmacological choices are the acceptable norm. The indications, contraindications, technique, and results of the above procedure should only be interpreted and judged by a Board-Certified Interventional Pain Specialist with extensive familiarity and expertise in the same exact procedure and technique.

## 2022-09-10 ENCOUNTER — Telehealth: Payer: Self-pay

## 2022-09-10 NOTE — Telephone Encounter (Signed)
Post procedure follow-up. LVM.   Arisa Congleton Mathayus Stanbery, RN  

## 2022-10-29 ENCOUNTER — Ambulatory Visit
Payer: Medicaid Other | Attending: Student in an Organized Health Care Education/Training Program | Admitting: Student in an Organized Health Care Education/Training Program

## 2022-10-29 ENCOUNTER — Encounter: Payer: Self-pay | Admitting: Student in an Organized Health Care Education/Training Program

## 2022-10-29 VITALS — BP 119/72 | HR 66 | Temp 97.9°F | Resp 16 | Ht 64.0 in | Wt 173.0 lb

## 2022-10-29 DIAGNOSIS — Z981 Arthrodesis status: Secondary | ICD-10-CM | POA: Diagnosis present

## 2022-10-29 DIAGNOSIS — G43709 Chronic migraine without aura, not intractable, without status migrainosus: Secondary | ICD-10-CM | POA: Diagnosis present

## 2022-10-29 DIAGNOSIS — M4802 Spinal stenosis, cervical region: Secondary | ICD-10-CM | POA: Diagnosis present

## 2022-10-29 DIAGNOSIS — M47812 Spondylosis without myelopathy or radiculopathy, cervical region: Secondary | ICD-10-CM

## 2022-10-29 DIAGNOSIS — G5703 Lesion of sciatic nerve, bilateral lower limbs: Secondary | ICD-10-CM | POA: Diagnosis not present

## 2022-10-29 DIAGNOSIS — M533 Sacrococcygeal disorders, not elsewhere classified: Secondary | ICD-10-CM | POA: Diagnosis not present

## 2022-10-29 DIAGNOSIS — Q761 Klippel-Feil syndrome: Secondary | ICD-10-CM | POA: Diagnosis present

## 2022-10-29 DIAGNOSIS — S7001XS Contusion of right hip, sequela: Secondary | ICD-10-CM | POA: Insufficient documentation

## 2022-10-29 DIAGNOSIS — G9050 Complex regional pain syndrome I, unspecified: Secondary | ICD-10-CM | POA: Diagnosis present

## 2022-10-29 DIAGNOSIS — G5701 Lesion of sciatic nerve, right lower limb: Secondary | ICD-10-CM | POA: Insufficient documentation

## 2022-10-29 DIAGNOSIS — G894 Chronic pain syndrome: Secondary | ICD-10-CM

## 2022-10-29 DIAGNOSIS — T85192S Other mechanical complication of implanted electronic neurostimulator (electrode) of spinal cord, sequela: Secondary | ICD-10-CM | POA: Insufficient documentation

## 2022-10-29 DIAGNOSIS — M47816 Spondylosis without myelopathy or radiculopathy, lumbar region: Secondary | ICD-10-CM | POA: Insufficient documentation

## 2022-10-29 MED ORDER — FENTANYL 50 MCG/HR TD PT72
1.0000 | MEDICATED_PATCH | TRANSDERMAL | 0 refills | Status: AC
Start: 2022-12-27 — End: 2023-01-26

## 2022-10-29 MED ORDER — FENTANYL 50 MCG/HR TD PT72
1.0000 | MEDICATED_PATCH | TRANSDERMAL | 0 refills | Status: DC
Start: 2023-01-26 — End: 2023-02-19

## 2022-10-29 MED ORDER — FENTANYL 50 MCG/HR TD PT72
1.0000 | MEDICATED_PATCH | TRANSDERMAL | 0 refills | Status: AC
Start: 2022-11-27 — End: 2022-12-27

## 2022-10-29 NOTE — Progress Notes (Signed)
PROVIDER NOTE: Information contained herein reflects review and annotations entered in association with encounter. Interpretation of such information and data should be left to medically-trained personnel. Information provided to patient can be located elsewhere in the medical record under "Patient Instructions". Document created using STT-dictation technology, any transcriptional errors that may result from process are unintentional.    Patient: Lindsay Austin  Service Category: E/M  Provider: Edward Jolly, MD  DOB: [birth year]  DOS: 10/29/2022  Specialty: Interventional Pain Management  MRN: [id number]  Setting: Ambulatory outpatient  PCP: Shane Crutch, PA  Type: Established Patient    Referring Provider: Shane Crutch, PA  Location: Office  Delivery: Face-to-face     HPI  Ms. Gazella Sterman, a 62 y.o. year old female, is here today because of her Chronic migraine without aura without status migrainosus, not intractable [G43.709]. Ms. Rients primary complain today is Back Pain (Lumbar bilateral ) and Leg Pain (Bilateral )  Last encounter: My last encounter with her was on 09/09/2022  Pertinent problems: Ms. Hensen has Hx of lumbosacral spine surgery; History of lumbar fusion (L5-S1 fusion); Failed spinal cord stimulator, sequela; Cervical fusion syndrome (C4-C7); Chronic pain syndrome; Major depressive disorder with current active episode; Lumbar spondylosis; Cervical spondylosis without myelopathy; and Spinal stenosis in cervical region on their pertinent problem list. Pain Assessment: Severity of Chronic pain is reported as a 4 /10. Location: Back Lower, Left, Right/into the hips and down both legs. Onset: More than a month ago. Quality: Aching, Burning, Constant, Discomfort. Timing: Constant. Modifying factor(s): laying down, patches. Vitals:  height is 5\' 4"  (1.626 m) and weight is 173 lb (78.5 kg). Her temporal temperature is 97.9 F (36.6 C). Her blood pressure is 119/72 and her pulse is 66. Her  respiration is 16 and oxygen saturation is 99%.   Reason for encounter: medication management.    Excellent response to previous Botox treatment.  States that she has had only 1 migraine since then which lasted only 6 hours.  Otherwise she is doing well.  Continues fentanyl patch as prescribed, no change in dose.  Pharmacotherapy Assessment  Analgesic:  fentanyl patch  50 mcg an hour; 120 MME.   Monitoring: [place] PMP: PDMP reviewed during this encounter.       Pharmacotherapy: No side-effects or adverse reactions reported. Compliance: No problems identified. Effectiveness: Clinically acceptable.  UDS:  Summary  Date Value Ref Range Status  05/07/2022 Note  Final    Comment:    ==================================================================== [employer] [facility] [employer] (MW) ==================================================================== Test                             Result       Flag       Units  Drug Present and Declared for Prescription Verification   Fentanyl                       148          EXPECTED   ng/mg creat   Norfentanyl                    632          EXPECTED   ng/mg creat    Source of fentanyl is a scheduled prescription medication, including    IV, patch, and transmucosal formulations. Norfentanyl is an expected    metabolite of fentanyl.  ==================================================================== Test  Result    Flag   Units      Ref Range   Creatinine              71               mg/dL      >=16 ==================================================================== Declared Medications:  The flagging and interpretation on this report are based on the  following declared medications.  Unexpected results may arise from  inaccuracies in the declared medications.   **Note: The testing scope of this panel includes these medications:   Fentanyl (Duragesic)   **Note: The testing scope of this panel does not include the  following  reported medications:   Albuterol (Ventolin HFA)  Bupropion (Wellbutrin)  Clopidogrel (Plavix)  Dicyclomine (Bentyl)  Methocarbamol (Robaxin)  Naloxone (Narcan)  Ondansetron (Zofran)  Pregabalin (Lyrica)  Risperidone (Risperdal)  Rizatriptan (Maxalt)  Sucralfate (Carafate)  Trazodone (Desyrel) ==================================================================== For clinical consultation, please call [phone]. ====================================================================       ROS  Constitutional: Denies any fever or chills Gastrointestinal: No reported hemesis, hematochezia, vomiting, or acute GI distress Musculoskeletal: Low back pain Neurological:  Migraines  Medication Review  DSS, albuterol, atorvastatin, buPROPion, clopidogrel, dicyclomine, fentaNYL, methocarbamol, naloxone, ondansetron, pregabalin, risperiDONE, rizatriptan, sucralfate, and traZODone  History Review  Allergy: Ms. Philson is allergic to methadone, other, and tape. Drug: Ms. Gartley  reports no history of drug use. Alcohol:  reports no history of alcohol use. Tobacco:  reports that she has never smoked. She has never used smokeless tobacco. Social: Ms. Muldrow  reports that she has never smoked. She has never used smokeless tobacco. She reports that she does not drink alcohol and does not use drugs. Medical:  has a past medical history of Anxiety, Arthritis, Bladder cancer (HCC), Cancer of kidney (HCC), DDD (degenerative disc disease), cervical, Depression, Failed back syndrome, Fibromyalgia, GERD (gastroesophageal reflux disease), Headache, Heart murmur, Hyperlipemia, Hypertension, IBS (irritable bowel syndrome), Kidney failure, Renal insufficiency, RSD (reflex sympathetic dystrophy), Seizure (HCC), Stomach ulcer, and Trigeminal neuralgia. Surgical: Ms. Jimmy  has a past surgical history that includes Transurethral resection of bladder tumor with gyrus (turbt-gyrus) (2012, 2013); Nephroureterectomy (2011);  Back surgery (1995); Cholecystectomy; Brain surgery (2004); trigeminal neuralgia (2004); Spinal cord stimulator implant (2004); colonscopy (2006); Posterior cervical fusion/foraminotomy (Bilateral, 07/05/2019); Posterior cervical laminectomy (Bilateral, 07/05/2019); and Breast biopsy (Left, 2017). Family: family history includes Dementia in her father; Diabetes in her mother; Heart disease in her mother; Kidney disease in her father.  Laboratory Chemistry Profile   Renal Lab Results  Component Value Date   BUN 13 12/29/2020   CREATININE 1.44 (H) 12/29/2020   LABCREA 71 08/29/2019   GFRAA 51 (L) 08/31/2019   GFRNONAA 42 (L) 12/29/2020     Hepatic Lab Results  Component Value Date   AST 32 08/29/2019   ALT 12 08/29/2019   ALBUMIN 4.1 08/29/2019   ALKPHOS 66 08/29/2019     Electrolytes Lab Results  Component Value Date   NA 139 12/29/2020   K 4.3 12/29/2020   CL 101 12/29/2020   CALCIUM 9.2 12/29/2020   MG 2.2 08/31/2019     Bone No results found for: "VD25OH", "VD125OH2TOT", "WJ1914NW2", "NF6213YQ6", "25OHVITD1", "25OHVITD2", "25OHVITD3", "TESTOFREE", "TESTOSTERONE"   Inflammation (CRP: Acute Phase) (ESR: Chronic Phase) No results found for: "CRP", "ESRSEDRATE", "LATICACIDVEN"     Note: Above Lab results reviewed.   Physical Exam  General appearance: Well nourished, well developed, and well hydrated. In no apparent acute distress Mental status: Alert, oriented x 3 (  person, place, & time)       Respiratory: No evidence of acute respiratory distress Eyes: PERLA Vitals: BP 119/72 (BP Location: Right Arm, Patient Position: Sitting, Cuff Size: Normal)   Pulse 66   Temp 97.9 F (36.6 C) (Temporal)   Resp 16   Ht 5\' 4"  (1.626 m)   Wt 173 lb (78.5 kg)   SpO2 99%   BMI 29.70 kg/m  BMI: Estimated body mass index is 29.7 kg/m as calculated from the following:   Height as of this encounter: 5\' 4"  (1.626 m).   Weight as of this encounter: 173 lb (78.5 kg). Ideal: Ideal body  weight: 54.7 kg (120 lb 9.5 oz) Adjusted ideal body weight: 64.2 kg (141 lb 8.9 oz)  Lumbar Spine Area Exam  Skin & Axial Inspection: Well healed scar from previous spine surgery detected Alignment: Symmetrical Functional ROM: Pain restricted ROM affecting primarily the right Stability: No instability detected Muscle Tone/Strength: Functionally intact. No obvious neuro-muscular anomalies detected. Sensory (Neurological): Musculoskeletal pain pattern    Gait & Posture Assessment  Ambulation: antalgic Gait: Limited. Using assistive device to ambulate Posture: Difficulty standing up straight, due to pain    Lower Extremity Exam      Side: Right lower extremity   Side: Left lower extremity  Stability: No instability observed           Stability: No instability observed          Skin & Extremity Inspection: Skin color, temperature, and hair growth are WNL. No peripheral edema or cyanosis. No masses, redness, swelling, asymmetry, or associated skin lesions. No contractures.   Skin & Extremity Inspection: Skin color, temperature, and hair growth are WNL. No peripheral edema or cyanosis. No masses, redness, swelling, asymmetry, or associated skin lesions. No contractures.  Functional ROM: Pain restricted ROM for all joints of the lower extremity           Functional ROM: Pain restricted ROM for all joints of the lower extremity          Muscle Tone/Strength: Functionally intact. No obvious neuro-muscular anomalies detected.   Muscle Tone/Strength: Functionally intact. No obvious neuro-muscular anomalies detected.  Sensory (Neurological): Unimpaired         Sensory (Neurological): Unimpaired        DTR: Patellar: deferred today Achilles: deferred today Plantar: deferred today   DTR: Patellar: deferred today Achilles: deferred today Plantar: deferred today  Palpation: No palpable anomalies   Palpation: No palpable anomalies       Assessment   Status Diagnosis   Controlled Controlled Controlled 1. Chronic migraine without aura without status migrainosus, not intractable   2. Sacroiliac joint dysfunction of right side   3. Sacroiliac joint dysfunction of left side   4. Failed spinal cord stimulator, sequela   5. History of lumbar fusion   6. Piriformis syndrome of both sides   7. Chronic pain syndrome   8. Cervical spondylosis without myelopathy   9. Spinal stenosis in cervical region   10. Lumbar spondylosis   11. Cervical fusion syndrome (C4-T2)   12. RSD (reflex sympathetic dystrophy)   13. Lumbar facet arthropathy   14. Cervical facet joint syndrome   15. Piriformis syndrome of right side   16. Contusion of right hip, sequela        Plan of Care  Ms. Exodus Mccrea has a current medication list which includes the following long-term medication(s): albuterol, atorvastatin, bupropion, dicyclomine, risperidone, rizatriptan, sucralfate, and trazodone.  1. Chronic migraine  without aura without status migrainosus, not intractable - Chemo Denervation; Future  2. Sacroiliac joint dysfunction of right side - fentaNYL (DURAGESIC) 50 MCG/HR; Place 1 patch onto the skin every 3 (three) days. Most last 30 days.  Dispense: 10 patch; Refill: 0 - fentaNYL (DURAGESIC) 50 MCG/HR; Place 1 patch onto the skin every 3 (three) days. Most last 30 days.  Dispense: 10 patch; Refill: 0 - fentaNYL (DURAGESIC) 50 MCG/HR; Place 1 patch onto the skin every 3 (three) days. Most last 30 days.  Dispense: 10 patch; Refill: 0  3. Sacroiliac joint dysfunction of left side  4. Failed spinal cord stimulator, sequela - fentaNYL (DURAGESIC) 50 MCG/HR; Place 1 patch onto the skin every 3 (three) days. Most last 30 days.  Dispense: 10 patch; Refill: 0 - fentaNYL (DURAGESIC) 50 MCG/HR; Place 1 patch onto the skin every 3 (three) days. Most last 30 days.  Dispense: 10 patch; Refill: 0 - fentaNYL (DURAGESIC) 50 MCG/HR; Place 1 patch onto the skin every 3 (three) days. Most last  30 days.  Dispense: 10 patch; Refill: 0  5. History of lumbar fusion - fentaNYL (DURAGESIC) 50 MCG/HR; Place 1 patch onto the skin every 3 (three) days. Most last 30 days.  Dispense: 10 patch; Refill: 0 - fentaNYL (DURAGESIC) 50 MCG/HR; Place 1 patch onto the skin every 3 (three) days. Most last 30 days.  Dispense: 10 patch; Refill: 0 - fentaNYL (DURAGESIC) 50 MCG/HR; Place 1 patch onto the skin every 3 (three) days. Most last 30 days.  Dispense: 10 patch; Refill: 0  6. Piriformis syndrome of both sides  7. Chronic pain syndrome - fentaNYL (DURAGESIC) 50 MCG/HR; Place 1 patch onto the skin every 3 (three) days. Most last 30 days.  Dispense: 10 patch; Refill: 0 - fentaNYL (DURAGESIC) 50 MCG/HR; Place 1 patch onto the skin every 3 (three) days. Most last 30 days.  Dispense: 10 patch; Refill: 0 - fentaNYL (DURAGESIC) 50 MCG/HR; Place 1 patch onto the skin every 3 (three) days. Most last 30 days.  Dispense: 10 patch; Refill: 0  8. Cervical spondylosis without myelopathy - fentaNYL (DURAGESIC) 50 MCG/HR; Place 1 patch onto the skin every 3 (three) days. Most last 30 days.  Dispense: 10 patch; Refill: 0 - fentaNYL (DURAGESIC) 50 MCG/HR; Place 1 patch onto the skin every 3 (three) days. Most last 30 days.  Dispense: 10 patch; Refill: 0 - fentaNYL (DURAGESIC) 50 MCG/HR; Place 1 patch onto the skin every 3 (three) days. Most last 30 days.  Dispense: 10 patch; Refill: 0  9. Spinal stenosis in cervical region - fentaNYL (DURAGESIC) 50 MCG/HR; Place 1 patch onto the skin every 3 (three) days. Most last 30 days.  Dispense: 10 patch; Refill: 0 - fentaNYL (DURAGESIC) 50 MCG/HR; Place 1 patch onto the skin every 3 (three) days. Most last 30 days.  Dispense: 10 patch; Refill: 0 - fentaNYL (DURAGESIC) 50 MCG/HR; Place 1 patch onto the skin every 3 (three) days. Most last 30 days.  Dispense: 10 patch; Refill: 0  10. Lumbar spondylosis - fentaNYL (DURAGESIC) 50 MCG/HR; Place 1 patch onto the skin every 3  (three) days. Most last 30 days.  Dispense: 10 patch; Refill: 0 - fentaNYL (DURAGESIC) 50 MCG/HR; Place 1 patch onto the skin every 3 (three) days. Most last 30 days.  Dispense: 10 patch; Refill: 0 - fentaNYL (DURAGESIC) 50 MCG/HR; Place 1 patch onto the skin every 3 (three) days. Most last 30 days.  Dispense: 10 patch; Refill: 0  11. Cervical fusion  syndrome (C4-T2) - fentaNYL (DURAGESIC) 50 MCG/HR; Place 1 patch onto the skin every 3 (three) days. Most last 30 days.  Dispense: 10 patch; Refill: 0 - fentaNYL (DURAGESIC) 50 MCG/HR; Place 1 patch onto the skin every 3 (three) days. Most last 30 days.  Dispense: 10 patch; Refill: 0 - fentaNYL (DURAGESIC) 50 MCG/HR; Place 1 patch onto the skin every 3 (three) days. Most last 30 days.  Dispense: 10 patch; Refill: 0  12. RSD (reflex sympathetic dystrophy) - fentaNYL (DURAGESIC) 50 MCG/HR; Place 1 patch onto the skin every 3 (three) days. Most last 30 days.  Dispense: 10 patch; Refill: 0 - fentaNYL (DURAGESIC) 50 MCG/HR; Place 1 patch onto the skin every 3 (three) days. Most last 30 days.  Dispense: 10 patch; Refill: 0 - fentaNYL (DURAGESIC) 50 MCG/HR; Place 1 patch onto the skin every 3 (three) days. Most last 30 days.  Dispense: 10 patch; Refill: 0  13. Lumbar facet arthropathy - fentaNYL (DURAGESIC) 50 MCG/HR; Place 1 patch onto the skin every 3 (three) days. Most last 30 days.  Dispense: 10 patch; Refill: 0 - fentaNYL (DURAGESIC) 50 MCG/HR; Place 1 patch onto the skin every 3 (three) days. Most last 30 days.  Dispense: 10 patch; Refill: 0 - fentaNYL (DURAGESIC) 50 MCG/HR; Place 1 patch onto the skin every 3 (three) days. Most last 30 days.  Dispense: 10 patch; Refill: 0  14. Cervical facet joint syndrome - fentaNYL (DURAGESIC) 50 MCG/HR; Place 1 patch onto the skin every 3 (three) days. Most last 30 days.  Dispense: 10 patch; Refill: 0 - fentaNYL (DURAGESIC) 50 MCG/HR; Place 1 patch onto the skin every 3 (three) days. Most last 30 days.  Dispense:  10 patch; Refill: 0 - fentaNYL (DURAGESIC) 50 MCG/HR; Place 1 patch onto the skin every 3 (three) days. Most last 30 days.  Dispense: 10 patch; Refill: 0  15. Piriformis syndrome of right side - fentaNYL (DURAGESIC) 50 MCG/HR; Place 1 patch onto the skin every 3 (three) days. Most last 30 days.  Dispense: 10 patch; Refill: 0 - fentaNYL (DURAGESIC) 50 MCG/HR; Place 1 patch onto the skin every 3 (three) days. Most last 30 days.  Dispense: 10 patch; Refill: 0 - fentaNYL (DURAGESIC) 50 MCG/HR; Place 1 patch onto the skin every 3 (three) days. Most last 30 days.  Dispense: 10 patch; Refill: 0  16. Contusion of right hip, sequela - fentaNYL (DURAGESIC) 50 MCG/HR; Place 1 patch onto the skin every 3 (three) days. Most last 30 days.  Dispense: 10 patch; Refill: 0 - fentaNYL (DURAGESIC) 50 MCG/HR; Place 1 patch onto the skin every 3 (three) days. Most last 30 days.  Dispense: 10 patch; Refill: 0 - fentaNYL (DURAGESIC) 50 MCG/HR; Place 1 patch onto the skin every 3 (three) days. Most last 30 days.  Dispense: 10 patch; Refill: 0      Pharmacotherapy (Medications Ordered): Meds ordered this encounter  Medications   fentaNYL (DURAGESIC) 50 MCG/HR    Sig: Place 1 patch onto the skin every 3 (three) days. Most last 30 days.    Dispense:  10 patch    Refill:  0    [place] STOP ACT - Not applicable. Fill one day early if pharmacy is closed on scheduled refill date.   fentaNYL (DURAGESIC) 50 MCG/HR    Sig: Place 1 patch onto the skin every 3 (three) days. Most last 30 days.    Dispense:  10 patch    Refill:  0    [place] STOP ACT -  Not applicable. Fill one day early if pharmacy is closed on scheduled refill date.   fentaNYL (DURAGESIC) 50 MCG/HR    Sig: Place 1 patch onto the skin every 3 (three) days. Most last 30 days.    Dispense:  10 patch    Refill:  0    [place] STOP ACT - Not applicable. Fill one day early if pharmacy is closed on scheduled refill date.   Follow-up for Botox for migraine  management  Orders Placed This Encounter  Procedures   Chemo Denervation    Standing Status:   Future    Standing Expiration Date:   10/29/2023    Scheduling Instructions:     Botox for migraines   Lyrica managed by neurology: 100 mg TID   Follow-up plan:   Return in about 16 weeks (around 02/18/2023) for MM+ Botox (schedule as E/M visit).   Recent Visits Date Type Provider Dept  09/09/22 Procedure visit Edward Jolly, MD [facility]  07/31/22 Office Visit Edward Jolly, MD [facility]  Showing recent visits within past 90 days and meeting all other requirements Today's Visits Date Type Provider Dept  10/29/22 Office Visit Edward Jolly, MD [facility]  Showing today's visits and meeting all other requirements Future Appointments No visits were found meeting these conditions. Showing future appointments within next 90 days and meeting all other requirements  I discussed the assessment and treatment plan with the patient. The patient was provided an opportunity to ask questions and all were answered. The patient agreed with the plan and demonstrated an understanding of the instructions.  Patient advised to call back or seek an in-person evaluation if the symptoms or condition worsens.  Duration of encounter: 35 minutes.  Note by: Edward Jolly, MD Date: 10/29/2022; Time: 2:46 PM

## 2022-10-29 NOTE — Progress Notes (Signed)
Nursing Pain Medication Assessment:  Safety precautions to be maintained throughout the outpatient stay will include: orient to surroundings, keep bed in low position, maintain call bell within reach at all times, provide assistance with transfer out of bed and ambulation.  Medication Inspection Compliance:  fentanyl patches   Medication: Fentanyl patch Pill/Patch Count:  9 of 10 patches remain Pill/Patch Appearance: Markings consistent with prescribed medication Bottle Appearance: Standard pharmacy container. Clearly labeled. Filled Date: 07 / 01 / 2024 Last Medication intake:  Yesterday

## 2022-11-12 ENCOUNTER — Encounter: Payer: Medicaid Other | Admitting: Student in an Organized Health Care Education/Training Program

## 2023-02-19 ENCOUNTER — Ambulatory Visit
Payer: Medicaid Other | Attending: Student in an Organized Health Care Education/Training Program | Admitting: Student in an Organized Health Care Education/Training Program

## 2023-02-19 ENCOUNTER — Encounter: Payer: Self-pay | Admitting: Student in an Organized Health Care Education/Training Program

## 2023-02-19 VITALS — BP 110/74 | HR 58 | Temp 98.1°F | Resp 16 | Ht 64.0 in | Wt 173.0 lb

## 2023-02-19 DIAGNOSIS — M47812 Spondylosis without myelopathy or radiculopathy, cervical region: Secondary | ICD-10-CM | POA: Insufficient documentation

## 2023-02-19 DIAGNOSIS — G5701 Lesion of sciatic nerve, right lower limb: Secondary | ICD-10-CM | POA: Diagnosis present

## 2023-02-19 DIAGNOSIS — Q761 Klippel-Feil syndrome: Secondary | ICD-10-CM | POA: Insufficient documentation

## 2023-02-19 DIAGNOSIS — M47816 Spondylosis without myelopathy or radiculopathy, lumbar region: Secondary | ICD-10-CM | POA: Diagnosis present

## 2023-02-19 DIAGNOSIS — M4802 Spinal stenosis, cervical region: Secondary | ICD-10-CM | POA: Insufficient documentation

## 2023-02-19 DIAGNOSIS — T85192S Other mechanical complication of implanted electronic neurostimulator (electrode) of spinal cord, sequela: Secondary | ICD-10-CM | POA: Insufficient documentation

## 2023-02-19 DIAGNOSIS — S7001XS Contusion of right hip, sequela: Secondary | ICD-10-CM | POA: Diagnosis present

## 2023-02-19 DIAGNOSIS — Z981 Arthrodesis status: Secondary | ICD-10-CM | POA: Insufficient documentation

## 2023-02-19 DIAGNOSIS — G43709 Chronic migraine without aura, not intractable, without status migrainosus: Secondary | ICD-10-CM | POA: Insufficient documentation

## 2023-02-19 DIAGNOSIS — G894 Chronic pain syndrome: Secondary | ICD-10-CM | POA: Diagnosis present

## 2023-02-19 DIAGNOSIS — G9050 Complex regional pain syndrome I, unspecified: Secondary | ICD-10-CM | POA: Insufficient documentation

## 2023-02-19 DIAGNOSIS — M533 Sacrococcygeal disorders, not elsewhere classified: Secondary | ICD-10-CM | POA: Insufficient documentation

## 2023-02-19 MED ORDER — FENTANYL 50 MCG/HR TD PT72
1.0000 | MEDICATED_PATCH | TRANSDERMAL | 0 refills | Status: DC
Start: 2023-05-05 — End: 2023-05-26

## 2023-02-19 MED ORDER — FENTANYL 50 MCG/HR TD PT72
1.0000 | MEDICATED_PATCH | TRANSDERMAL | 0 refills | Status: AC
Start: 2023-04-05 — End: 2023-05-05

## 2023-02-19 MED ORDER — ONABOTULINUMTOXINA 200 UNITS IJ SOLR
155.0000 [IU] | Freq: Once | INTRAMUSCULAR | Status: AC
  Administered 2023-02-19: 155 [IU] via INTRAMUSCULAR
  Filled 2023-02-19: qty 200

## 2023-02-19 MED ORDER — SODIUM CHLORIDE (PF) 0.9 % IJ SOLN
INTRAMUSCULAR | Status: AC
  Filled 2023-02-19: qty 10

## 2023-02-19 MED ORDER — FENTANYL 50 MCG/HR TD PT72
1.0000 | MEDICATED_PATCH | TRANSDERMAL | 0 refills | Status: AC
Start: 2023-03-06 — End: 2023-04-05

## 2023-02-19 NOTE — Progress Notes (Signed)
PROVIDER NOTE: Information contained herein reflects review and annotations entered in association with encounter. Interpretation of such information and data should be left to medically-trained personnel. Information provided to patient can be located elsewhere in the medical record under "Patient Instructions". Document created using STT-dictation technology, any transcriptional errors that may result from process are unintentional.    Patient: Lindsay Austin  Service Category: Procedure  Provider: Edward Jolly, MD  DOB: Jun 16, 1960  DOS: 02/19/2023  Location: [facility]  MRN: [id number]  Setting: Ambulatory - outpatient  Referring Provider: Shane Crutch, PA  Type: Established Patient  Specialty: Interventional Pain Management  PCP: Shane Crutch, PA   Primary Reason for Visit: Interventional Pain Management Treatment. CC: migraine     Procedure:            Botox for migraine management #6           1. Chronic migraine without aura without status migrainosus, not intractable   2. Sacroiliac joint dysfunction of right side   3. Sacroiliac joint dysfunction of left side   4. Failed spinal cord stimulator, sequela   5. History of lumbar fusion   6. Chronic pain syndrome   7. Cervical spondylosis without myelopathy   8. Spinal stenosis in cervical region   9. Lumbar spondylosis   10. Cervical fusion syndrome (C4-T2)   11. RSD (reflex sympathetic dystrophy)   12. Lumbar facet arthropathy   13. Cervical facet joint syndrome   14. Piriformis syndrome of right side   15. Contusion of right hip, sequela     Pain Score: Pre-procedure: 4 /10 Post-procedure: 4 /10     Pre-op H&P Assessment:  Lindsay Austin is a 62 y.o. (year old), female patient, seen today for interventional treatment. 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 Lindsay Austin has a current medication list which includes the following prescription(s): albuterol, atorvastatin, bupropion, clopidogrel, dicyclomine, dss, methocarbamol, naloxone, ondansetron, risperidone, rizatriptan, sucralfate, trazodone, [START ON 03/06/2023] fentanyl, [START ON 04/05/2023] fentanyl, and [START ON 05/05/2023] fentanyl. Her primarily concern today is the Migraine   Initial Vital Signs:  Pulse/HCG Rate: (!) 58  Temp: 98.1 F (36.7 C) Resp: 16 BP: 110/74 SpO2: 94 %  BMI: Estimated body mass index is 29.7 kg/m as calculated from the following:   Height as of this encounter: 5\' 4"  (1.626 m).   Weight as of this encounter: 173 lb (78.5 kg).  Risk Assessment: Allergies: Reviewed. She is allergic to methadone, other, and tape.  Allergy Precautions: None required Coagulopathies: Reviewed. None identified.  Blood-thinner therapy: None at this time Active Infection(s): Reviewed. None identified. Lindsay Austin is afebrile  Site Confirmation: Lindsay Austin was asked to confirm the procedure and laterality before marking the site Procedure checklist: Completed Consent: Before the procedure and under the influence of no sedative(s), amnesic(s), or anxiolytics, the patient was informed of the treatment options, risks and possible complications. To fulfill our ethical and legal obligations, as recommended by the American Medical Association's Code of Ethics, I have informed the patient of my clinical impression; the nature and purpose of the treatment or procedure; the risks, benefits, and possible complications of the intervention; the alternatives, including doing nothing; the risk(s) and benefit(s) of the alternative treatment(s)  or procedure(s);  and the risk(s) and benefit(s) of doing nothing. The patient was provided information about the general risks and possible complications associated with the procedure. These may include, but are not limited to: failure to achieve desired goals, infection, bleeding, organ or nerve damage, allergic reactions, paralysis, and death. In addition, the patient was informed of those risks and complications associated to the procedure, such as failure to decrease pain; infection; bleeding; organ or nerve damage with subsequent damage to sensory, motor, and/or autonomic systems, resulting in permanent pain, numbness, and/or weakness of one or several areas of the body; allergic reactions; (i.e.: anaphylactic reaction); and/or death. Furthermore, the patient was informed of those risks and complications associated with the medications. These include, but are not limited to: allergic reactions (i.e.: anaphylactic or anaphylactoid reaction(s)); adrenal axis suppression; blood sugar elevation that in diabetics may result in ketoacidosis or comma; water retention that in patients with history of congestive heart failure may result in shortness of breath, pulmonary edema, and decompensation with resultant heart failure; weight gain; swelling or edema; medication-induced neural toxicity; particulate matter embolism and blood vessel occlusion with resultant organ, and/or nervous system infarction; and/or aseptic necrosis of one or more joints. Finally, the patient was informed that Medicine is not an exact science; therefore, there is also the possibility of unforeseen or unpredictable risks and/or possible complications that may result in a catastrophic outcome. The patient indicated having understood very clearly. We have given the patient no guarantees and we have made no promises. Enough time was given to the patient to ask questions, all of which were answered to the patient's satisfaction. Lindsay Austin has indicated that she  wanted to continue with the procedure. Attestation: I, the ordering provider, attest that I have discussed with the patient the benefits, risks, side-effects, alternatives, likelihood of achieving goals, and potential problems during recovery for the procedure that I have provided informed consent. Date  Time: 02/19/2023 11:32 AM  Pre-Procedure Preparation:  Monitoring: As per clinic protocol. Respiration, ETCO2, SpO2, BP, heart rate and rhythm monitor placed and checked for adequate function Safety Precautions: Patient was assessed for positional comfort and pressure points before starting the procedure. Time-out: I initiated and conducted the "Time-out" before starting the procedure, as per protocol. The patient was asked to participate by confirming the accuracy of the "Time Out" information. Verification of the correct person, site, and procedure were performed and confirmed by me, the nursing staff, and the patient. "Time-out" conducted as per Joint Commission's Universal Protocol (UP.01.01.01). Time: 1227  Constitution and injection details as below:   1 x 200 unit vial of Botox brand onabotulinum toxin A was diluted with 4 mL preservative free saline, for a final concentration of 5 units/0.1 mL. Injection sites were prepared for injection with alcohol swab  Botox was injected as follows:  5 units left corrugator 5 units right corrugator 5 units procerus  5 units in 2 sites, right frontalis (10 units total) 5units in 2 sites, left frontalis (10 units total) 5 units in each of 4 sites right temporalis (20 units total) 5 units in each of 4 sites left temporalis (20 units total) 5 units in each of 3 sites right occipitalis (15 units total) 5 units in each of 3 sites left occipitalis (15 units total) 5 units in each of 2 sites right cervical paraspinal at approximate C3-4 level (10 units total) 5 units in each of 2 sites left cervical paraspinal muscles at approximate C3-4 level (10  units total)  5 units in each of 3 sites right trapezius (15 units total) 5 units in each of 3 sites left trapezius (15 units total)  45 units were wasted  A total of 155 units was administered by Dr Cherylann Ratel        Post-operative Assessment:  Post-procedure Vital Signs:  Pulse/HCG Rate: (!) 58  Temp: 98.1 F (36.7 C) Resp: 16 BP: 110/74 SpO2: 94 %  EBL: None  Complications: No immediate post-treatment complications observed by team, or reported by patient.  Note: The patient tolerated the entire procedure well. A repeat set of vitals were taken after the procedure and the patient was kept under observation following institutional policy, for this type of procedure. Post-procedural neurological assessment was performed, showing return to baseline, prior to discharge. The patient was provided with post-procedure discharge instructions, including a section on how to identify potential problems. Should any problems arise concerning this procedure, the patient was given instructions to immediately contact us, at any time, without hesitation. In any case, we plan to contact the patient by telephone for a follow-up status report regarding this interventional procedure.  Comments:  No additional relevant information.  Plan of Care   Chronic Opioid Analgesic:   fentanyl patch  50 mcg an hour; 120 MME.   Medications ordered for procedure: Meds ordered this encounter  Medications   botulinum toxin Type A (BOTOX) injection 155 Units   fentaNYL (DURAGESIC) 50 MCG/HR    Sig: Place 1 patch onto the skin every 3 (three) days. Most last 30 days.    Dispense:  10 patch    Refill:  0    [place] STOP ACT - Not applicable. Fill one day early if pharmacy is closed on scheduled refill date.   fentaNYL (DURAGESIC) 50 MCG/HR    Sig: Place 1 patch onto the skin every 3 (three) days. Most last 30 days.    Dispense:  10 patch    Refill:  0    [place] STOP ACT - Not applicable. Fill one day early if pharmacy  is closed on scheduled refill date.   fentaNYL (DURAGESIC) 50 MCG/HR    Sig: Place 1 patch onto the skin every 3 (three) days. Most last 30 days.    Dispense:  10 patch    Refill:  0    [place] STOP ACT - Not applicable. Fill one day early if pharmacy is closed on scheduled refill date.   Medications administered: We administered botulinum toxin Type A.  See the medical record for exact dosing, route, and time of administration.  Follow-up plan:   Return in about 3 months (around 05/22/2023) for MM, F2F Botox injections (schedule as E/M) visit.     Recent Visits No visits were found meeting these conditions. Showing recent visits within past 90 days and meeting all other requirements Today's Visits Date Type Provider Dept  02/19/23 Procedure visit Edward Jolly, MD [facility]  Showing today's visits and meeting all other requirements Future Appointments No visits were found meeting these conditions. Showing future appointments within next 90 days and meeting all other requirements  Disposition: Discharge home  Discharge (Date  Time): 02/19/2023; 1234 hrs.   Primary Care Physician: Shane Crutch, [place] Location: [facility] Note by: Edward Jolly, MD Date: 02/19/2023; Time: 12:47 PM  Disclaimer:  Medicine is not an exact science. The only guarantee in medicine is that nothing is guaranteed. It is important to note that the decision to proceed with this intervention was based  on the information collected from the patient. The Data and conclusions were drawn from the patient's questionnaire, the interview, and the physical examination. Because the information was provided in large part by the patient, it cannot be guaranteed that it has not been purposely or unconsciously manipulated. Every effort has been made to obtain as much relevant data as possible for this evaluation. It is important to note that the conclusions that lead to this procedure are  derived in large part from the available data. Always take into account that the treatment will also be dependent on availability of resources and existing treatment guidelines, considered by other Pain Management Practitioners as being common knowledge and practice, at the time of the intervention. For Medico-Legal purposes, it is also important to point out that variation in procedural techniques and pharmacological choices are the acceptable norm. The indications, contraindications, technique, and results of the above procedure should only be interpreted and judged by a Board-Certified Interventional Pain Specialist with extensive familiarity and expertise in the same exact procedure and technique.

## 2023-02-19 NOTE — Patient Instructions (Signed)
______________________________________________________________________    Post-Procedure Discharge Instructions  Instructions: Apply ice:  Purpose: This will minimize any swelling and discomfort after procedure.  When: Day of procedure, as soon as you get home. How: Fill a plastic sandwich bag with crushed ice. Cover it with a small towel and apply to injection site. How long: (15 min on, 15 min off) Apply for 15 minutes then remove x 15 minutes.  Repeat sequence on day of procedure, until you go to bed. Apply heat:  Purpose: To treat any soreness and discomfort from the procedure. When: Starting the next day after the procedure. How: Apply heat to procedure site starting the day following the procedure. How long: May continue to repeat daily, until discomfort goes away. Food intake: Start with clear liquids (like water) and advance to regular food, as tolerated.  Physical activities: Keep activities to a minimum for the first 8 hours after the procedure. After that, then as tolerated. Driving: If you have received any sedation, be responsible and do not drive. You are not allowed to drive for 24 hours after having sedation. Blood thinner: (Applies only to those taking blood thinners) You may restart your blood thinner 6 hours after your procedure. Insulin: (Applies only to Diabetic patients taking insulin) As soon as you can eat, you may resume your normal dosing schedule. Infection prevention: Keep procedure site clean and dry. Shower daily and clean area with soap and water. Post-procedure Pain Diary: Extremely important that this be done correctly and accurately. Recorded information will be used to determine the next step in treatment. For the purpose of accuracy, follow these rules: Evaluate only the area treated. Do not report or include pain from an untreated area. For the purpose of this evaluation, ignore all other areas of pain, except for the treated area. After your procedure,  avoid taking a long nap and attempting to complete the pain diary after you wake up. Instead, set your alarm clock to go off every hour, on the hour, for the initial 8 hours after the procedure. Document the duration of the numbing medicine, and the relief you are getting from it. Do not go to sleep and attempt to complete it later. It will not be accurate. If you received sedation, it is likely that you were given a medication that may cause amnesia. Because of this, completing the diary at a later time may cause the information to be inaccurate. This information is needed to plan your care. Follow-up appointment: Keep your post-procedure follow-up evaluation appointment after the procedure (usually 2 weeks for most procedures, 6 weeks for radiofrequencies). DO NOT FORGET to bring you pain diary with you.   Expect: (What should I expect to see with my procedure?) From numbing medicine (AKA: Local Anesthetics): Numbness or decrease in pain. You may also experience some weakness, which if present, could last for the duration of the local anesthetic. Onset: Full effect within 15 minutes of injected. Duration: It will depend on the type of local anesthetic used. On the average, 1 to 8 hours.  From steroids (Applies only if steroids were used): Decrease in swelling or inflammation. Once inflammation is improved, relief of the pain will follow. Onset of benefits: Depends on the amount of swelling present. The more swelling, the longer it will take for the benefits to be seen. In some cases, up to 10 days. Duration: Steroids will stay in the system x 2 weeks. Duration of benefits will depend on multiple posibilities including persistent irritating factors. Side-effects: If  present, they may typically last 2 weeks (the duration of the steroids). Frequent: Cramps (if they occur, drink Gatorade and take over-the-counter Magnesium 450-500 mg once to twice a day); water retention with temporary weight gain;  increases in blood sugar; decreased immune system response; increased appetite. Occasional: Facial flushing (red, warm cheeks); mood swings; menstrual changes. Uncommon: Long-term decrease or suppression of natural hormones; bone thinning. (These are more common with higher doses or more frequent use. This is why we prefer that our patients avoid having any injection therapies in other practices.)  Very Rare: Severe mood changes; psychosis; aseptic necrosis. From procedure: Some discomfort is to be expected once the numbing medicine wears off. This should be minimal if ice and heat are applied as instructed.  Call if: (When should I call?) You experience numbness and weakness that gets worse with time, as opposed to wearing off. New onset bowel or bladder incontinence. (Applies only to procedures done in the spine)  Emergency Numbers: Durning business hours (Monday - Thursday, 8:00 AM - 4:00 PM) (Friday, 9:00 AM - 12:00 Noon): [phone] After hours: [phone] NOTE: If you are having a problem and are unable connect with, or to talk to a provider, then go to your nearest urgent care or emergency department. If the problem is serious and urgent, please call 911. ______________________________________________________________________   Remain upright x 4 hours No pressure to the areas of treatment x 3 days

## 2023-02-19 NOTE — Progress Notes (Signed)
Nursing Pain Medication Assessment:  Safety precautions to be maintained throughout the outpatient stay will include: orient to surroundings, keep bed in low position, maintain call bell within reach at all times, provide assistance with transfer out of bed and ambulation.  Medication Inspection Compliance: Pill count conducted under aseptic conditions, in front of the patient. Neither the pills nor the bottle was removed from the patient's sight at any time. Once count was completed pills were immediately returned to the patient in their original bottle.  Medication: Fentanyl patch Pill/Patch Count:  5 of 10 pills remain Pill/Patch Appearance: Markings consistent with prescribed medication Bottle Appearance: Standard pharmacy container. Clearly labeled. Filled Date: 10 / 08 / 2024 Last Medication intake:  Day before yesterday

## 2023-02-19 NOTE — Progress Notes (Signed)
PROVIDER NOTE: Information contained herein reflects review and annotations entered in association with encounter. Interpretation of such information and data should be left to medically-trained personnel. Information provided to patient can be located elsewhere in the medical record under "Patient Instructions". Document created using STT-dictation technology, any transcriptional errors that may result from process are unintentional.    Patient: Lindsay Austin  Service Category: E/M  Provider: Edward Jolly, MD  DOB: 03-18-1961  DOS: 62/23/2024  Specialty: Interventional Pain Management  MRN: [id number]  Setting: Ambulatory outpatient  PCP: Shane Crutch, PA  Type: Established Patient    Referring Provider: Shane Crutch, PA  Location: Office  Delivery: Face-to-face     HPI  Ms. Lilika Trimnal, a 62 y.o. year old female, is here today because of her Chronic migraine without aura without status migrainosus, not intractable [G43.709]. Ms. Hoschar primary complain today is Migraine  Last encounter: My last encounter with her was on 10/29/2022  Pertinent problems: Ms. Courtois has Hx of lumbosacral spine surgery; History of lumbar fusion (L5-S1 fusion); Failed spinal cord stimulator, sequela; Cervical fusion syndrome (C4-C7); Chronic pain syndrome; Major depressive disorder with current active episode; Lumbar spondylosis; Cervical spondylosis without myelopathy; and Spinal stenosis in cervical region on their pertinent problem list. Pain Assessment: Severity of Chronic pain is reported as a 4 /10. Location: Other (Comment) (migraine)  (usually right frontal, sometimes left frontal)/ . Onset: More than a month ago. Quality: Headache, Aching. Timing: Intermittent. Modifying factor(s): botox. Vitals:  height is 5\' 4"  (1.626 m) and weight is 173 lb (78.5 kg). Her temporal temperature is 98.1 F (36.7 C). Her blood pressure is 110/74 and her pulse is 58 (abnormal). Her respiration is 16 and oxygen saturation is 94%.    Reason for encounter: medication management.  Patient is also here for Botox for migraine management.  No change in medical history since last visit.  Patient's pain is at baseline.  Patient continues multimodal pain regimen as prescribed.  States that it provides pain relief and improvement in functional status. She has been experiencing increased migraines.  Is here today for Botox treatment for migraine management.  We will repeat again in 3 months.  See attached procedure note.  Pharmacotherapy Assessment  Analgesic:  fentanyl patch  50 mcg an hour; 120 MME.   Monitoring: [place] PMP: PDMP reviewed during this encounter.       Pharmacotherapy: No side-effects or adverse reactions reported. Compliance: No problems identified. Effectiveness: Clinically acceptable.  UDS:  Summary  Date Value Ref Range Status  05/07/2022 Note  Final    Comment:    ==================================================================== [employer] Select 13 (MW) ==================================================================== Test                             Result       Flag       Units  Drug Present and Declared for Prescription Verification   Fentanyl                       148          EXPECTED   ng/mg creat   Norfentanyl                    632          EXPECTED   ng/mg creat    Source of fentanyl is a scheduled prescription medication, including    IV, patch, and transmucosal formulations. Norfentanyl  is an expected    metabolite of fentanyl.  ==================================================================== Test                      Result    Flag   Units      Ref Range   Creatinine              71               mg/dL      >=10 ==================================================================== Declared Medications:  The flagging and interpretation on this report are based on the  following declared medications.  Unexpected results may arise from  inaccuracies in the declared  medications.   **Note: The testing scope of this panel includes these medications:   Fentanyl (Duragesic)   **Note: The testing scope of this panel does not include the  following reported medications:   Albuterol (Ventolin HFA)  Bupropion (Wellbutrin)  Clopidogrel (Plavix)  Dicyclomine (Bentyl)  Methocarbamol (Robaxin)  Naloxone (Narcan)  Ondansetron (Zofran)  Pregabalin (Lyrica)  Risperidone (Risperdal)  Rizatriptan (Maxalt)  Sucralfate (Carafate)  Trazodone (Desyrel) ==================================================================== For clinical consultation, please call [phone]. ====================================================================       ROS  Constitutional: Denies any fever or chills Gastrointestinal: No reported hemesis, hematochezia, vomiting, or acute GI distress Musculoskeletal: Low back pain Neurological:  Migraines  Medication Review  DSS, albuterol, atorvastatin, buPROPion, clopidogrel, dicyclomine, fentaNYL, methocarbamol, naloxone, ondansetron, risperiDONE, rizatriptan, sucralfate, and traZODone  History Review  Allergy: Ms. Blacknall is allergic to methadone, other, and tape. Drug: Ms. Washington  reports no history of drug use. Alcohol:  reports no history of alcohol use. Tobacco:  reports that she has never smoked. She has never used smokeless tobacco. Social: Ms. Newbern  reports that she has never smoked. She has never used smokeless tobacco. She reports that she does not drink alcohol and does not use drugs. Medical:  has a past medical history of Anxiety, Arthritis, Bladder cancer (HCC), Cancer of kidney (HCC), DDD (degenerative disc disease), cervical, Depression, Failed back syndrome, Fibromyalgia, GERD (gastroesophageal reflux disease), Headache, Heart murmur, Hyperlipemia, Hypertension, IBS (irritable bowel syndrome), Kidney failure, Renal insufficiency, RSD (reflex sympathetic dystrophy), Seizure (HCC), Stomach ulcer, and Trigeminal  neuralgia. Surgical: Ms. Arlt  has a past surgical history that includes Transurethral resection of bladder tumor with gyrus (turbt-gyrus) (2012, 2013); Nephroureterectomy (2011); Back surgery (1995); Cholecystectomy; Brain surgery (2004); trigeminal neuralgia (2004); Spinal cord stimulator implant (2004); colonscopy (2006); Posterior cervical fusion/foraminotomy (Bilateral, 07/05/2019); Posterior cervical laminectomy (Bilateral, 07/05/2019); and Breast biopsy (Left, 2017). Family: family history includes Dementia in her father; Diabetes in her mother; Heart disease in her mother; Kidney disease in her father.  Laboratory Chemistry Profile   Renal Lab Results  Component Value Date   BUN 13 12/29/2020   CREATININE 1.44 (H) 12/29/2020   LABCREA 71 08/29/2019   GFRAA 51 (L) 08/31/2019   GFRNONAA 42 (L) 12/29/2020     Hepatic Lab Results  Component Value Date   AST 32 08/29/2019   ALT 12 08/29/2019   ALBUMIN 4.1 08/29/2019   ALKPHOS 66 08/29/2019     Electrolytes Lab Results  Component Value Date   NA 139 12/29/2020   K 4.3 12/29/2020   CL 101 12/29/2020   CALCIUM 9.2 12/29/2020   MG 2.2 08/31/2019     Bone No results found for: "VD25OH", "VD125OH2TOT", "HK7425ZD6", "LO7564PP2", "25OHVITD1", "25OHVITD2", "25OHVITD3", "TESTOFREE", "TESTOSTERONE"   Inflammation (CRP: Acute Phase) (ESR: Chronic Phase) No results found for: "CRP", "ESRSEDRATE", "LATICACIDVEN"  Note: Above Lab results reviewed.   Physical Exam  General appearance: Well nourished, well developed, and well hydrated. In no apparent acute distress Mental status: Alert, oriented x 3 (person, place, & time)       Respiratory: No evidence of acute respiratory distress Eyes: PERLA Vitals: BP 110/74   Pulse (!) 58   Temp 98.1 F (36.7 C) (Temporal)   Resp 16   Ht 5\' 4"  (1.626 m)   Wt 173 lb (78.5 kg)   SpO2 94%   BMI 29.70 kg/m  BMI: Estimated body mass index is 29.7 kg/m as calculated from the following:    Height as of this encounter: 5\' 4"  (1.626 m).   Weight as of this encounter: 173 lb (78.5 kg). Ideal: Ideal body weight: 54.7 kg (120 lb 9.5 oz) Adjusted ideal body weight: 64.2 kg (141 lb 8.9 oz)  Lumbar Spine Area Exam  Skin & Axial Inspection: Well healed scar from previous spine surgery detected Alignment: Symmetrical Functional ROM: Pain restricted ROM affecting primarily the right Stability: No instability detected Muscle Tone/Strength: Functionally intact. No obvious neuro-muscular anomalies detected. Sensory (Neurological): Musculoskeletal pain pattern    Gait & Posture Assessment  Ambulation: antalgic Gait: Limited. Using assistive device to ambulate Posture: Difficulty standing up straight, due to pain    Lower Extremity Exam      Side: Right lower extremity   Side: Left lower extremity  Stability: No instability observed           Stability: No instability observed          Skin & Extremity Inspection: Skin color, temperature, and hair growth are WNL. No peripheral edema or cyanosis. No masses, redness, swelling, asymmetry, or associated skin lesions. No contractures.   Skin & Extremity Inspection: Skin color, temperature, and hair growth are WNL. No peripheral edema or cyanosis. No masses, redness, swelling, asymmetry, or associated skin lesions. No contractures.  Functional ROM: Pain restricted ROM for all joints of the lower extremity           Functional ROM: Pain restricted ROM for all joints of the lower extremity          Muscle Tone/Strength: Functionally intact. No obvious neuro-muscular anomalies detected.   Muscle Tone/Strength: Functionally intact. No obvious neuro-muscular anomalies detected.  Sensory (Neurological): Unimpaired         Sensory (Neurological): Unimpaired        DTR: Patellar: deferred today Achilles: deferred today Plantar: deferred today   DTR: Patellar: deferred today Achilles: deferred today Plantar: deferred today  Palpation: No palpable  anomalies   Palpation: No palpable anomalies       Assessment   Status Diagnosis  Having a Flare-up Controlled Controlled 1. Chronic migraine without aura without status migrainosus, not intractable   2. Sacroiliac joint dysfunction of right side   3. Sacroiliac joint dysfunction of left side   4. Failed spinal cord stimulator, sequela   5. History of lumbar fusion   6. Chronic pain syndrome   7. Cervical spondylosis without myelopathy   8. Spinal stenosis in cervical region   9. Lumbar spondylosis   10. Cervical fusion syndrome (C4-T2)   11. RSD (reflex sympathetic dystrophy)   12. Lumbar facet arthropathy   13. Cervical facet joint syndrome   14. Piriformis syndrome of right side   15. Contusion of right hip, sequela        Plan of Care  Ms. Molina Latimore has a current medication list which includes the following  long-term medication(s): albuterol, atorvastatin, bupropion, dicyclomine, risperidone, rizatriptan, sucralfate, and trazodone.  1. Chronic migraine without aura without status migrainosus, not intractable - botulinum toxin Type A (BOTOX) injection 155 Units  2. Sacroiliac joint dysfunction of right side - fentaNYL (DURAGESIC) 50 MCG/HR; Place 1 patch onto the skin every 3 (three) days. Most last 30 days.  Dispense: 10 patch; Refill: 0 - fentaNYL (DURAGESIC) 50 MCG/HR; Place 1 patch onto the skin every 3 (three) days. Most last 30 days.  Dispense: 10 patch; Refill: 0 - fentaNYL (DURAGESIC) 50 MCG/HR; Place 1 patch onto the skin every 3 (three) days. Most last 30 days.  Dispense: 10 patch; Refill: 0  3. Sacroiliac joint dysfunction of left side  4. Failed spinal cord stimulator, sequela - fentaNYL (DURAGESIC) 50 MCG/HR; Place 1 patch onto the skin every 3 (three) days. Most last 30 days.  Dispense: 10 patch; Refill: 0 - fentaNYL (DURAGESIC) 50 MCG/HR; Place 1 patch onto the skin every 3 (three) days. Most last 30 days.  Dispense: 10 patch; Refill: 0 - fentaNYL  (DURAGESIC) 50 MCG/HR; Place 1 patch onto the skin every 3 (three) days. Most last 30 days.  Dispense: 10 patch; Refill: 0  5. History of lumbar fusion - fentaNYL (DURAGESIC) 50 MCG/HR; Place 1 patch onto the skin every 3 (three) days. Most last 30 days.  Dispense: 10 patch; Refill: 0 - fentaNYL (DURAGESIC) 50 MCG/HR; Place 1 patch onto the skin every 3 (three) days. Most last 30 days.  Dispense: 10 patch; Refill: 0 - fentaNYL (DURAGESIC) 50 MCG/HR; Place 1 patch onto the skin every 3 (three) days. Most last 30 days.  Dispense: 10 patch; Refill: 0  6. Chronic pain syndrome - fentaNYL (DURAGESIC) 50 MCG/HR; Place 1 patch onto the skin every 3 (three) days. Most last 30 days.  Dispense: 10 patch; Refill: 0 - fentaNYL (DURAGESIC) 50 MCG/HR; Place 1 patch onto the skin every 3 (three) days. Most last 30 days.  Dispense: 10 patch; Refill: 0 - fentaNYL (DURAGESIC) 50 MCG/HR; Place 1 patch onto the skin every 3 (three) days. Most last 30 days.  Dispense: 10 patch; Refill: 0  7. Cervical spondylosis without myelopathy - fentaNYL (DURAGESIC) 50 MCG/HR; Place 1 patch onto the skin every 3 (three) days. Most last 30 days.  Dispense: 10 patch; Refill: 0 - fentaNYL (DURAGESIC) 50 MCG/HR; Place 1 patch onto the skin every 3 (three) days. Most last 30 days.  Dispense: 10 patch; Refill: 0 - fentaNYL (DURAGESIC) 50 MCG/HR; Place 1 patch onto the skin every 3 (three) days. Most last 30 days.  Dispense: 10 patch; Refill: 0  8. Spinal stenosis in cervical region - fentaNYL (DURAGESIC) 50 MCG/HR; Place 1 patch onto the skin every 3 (three) days. Most last 30 days.  Dispense: 10 patch; Refill: 0 - fentaNYL (DURAGESIC) 50 MCG/HR; Place 1 patch onto the skin every 3 (three) days. Most last 30 days.  Dispense: 10 patch; Refill: 0 - fentaNYL (DURAGESIC) 50 MCG/HR; Place 1 patch onto the skin every 3 (three) days. Most last 30 days.  Dispense: 10 patch; Refill: 0  9. Lumbar spondylosis - fentaNYL (DURAGESIC) 50 MCG/HR;  Place 1 patch onto the skin every 3 (three) days. Most last 30 days.  Dispense: 10 patch; Refill: 0 - fentaNYL (DURAGESIC) 50 MCG/HR; Place 1 patch onto the skin every 3 (three) days. Most last 30 days.  Dispense: 10 patch; Refill: 0 - fentaNYL (DURAGESIC) 50 MCG/HR; Place 1 patch onto the skin every 3 (three) days. Most  last 30 days.  Dispense: 10 patch; Refill: 0  10. Cervical fusion syndrome (C4-T2) - fentaNYL (DURAGESIC) 50 MCG/HR; Place 1 patch onto the skin every 3 (three) days. Most last 30 days.  Dispense: 10 patch; Refill: 0 - fentaNYL (DURAGESIC) 50 MCG/HR; Place 1 patch onto the skin every 3 (three) days. Most last 30 days.  Dispense: 10 patch; Refill: 0 - fentaNYL (DURAGESIC) 50 MCG/HR; Place 1 patch onto the skin every 3 (three) days. Most last 30 days.  Dispense: 10 patch; Refill: 0  11. RSD (reflex sympathetic dystrophy) - fentaNYL (DURAGESIC) 50 MCG/HR; Place 1 patch onto the skin every 3 (three) days. Most last 30 days.  Dispense: 10 patch; Refill: 0 - fentaNYL (DURAGESIC) 50 MCG/HR; Place 1 patch onto the skin every 3 (three) days. Most last 30 days.  Dispense: 10 patch; Refill: 0 - fentaNYL (DURAGESIC) 50 MCG/HR; Place 1 patch onto the skin every 3 (three) days. Most last 30 days.  Dispense: 10 patch; Refill: 0  12. Lumbar facet arthropathy - fentaNYL (DURAGESIC) 50 MCG/HR; Place 1 patch onto the skin every 3 (three) days. Most last 30 days.  Dispense: 10 patch; Refill: 0 - fentaNYL (DURAGESIC) 50 MCG/HR; Place 1 patch onto the skin every 3 (three) days. Most last 30 days.  Dispense: 10 patch; Refill: 0 - fentaNYL (DURAGESIC) 50 MCG/HR; Place 1 patch onto the skin every 3 (three) days. Most last 30 days.  Dispense: 10 patch; Refill: 0  13. Cervical facet joint syndrome - fentaNYL (DURAGESIC) 50 MCG/HR; Place 1 patch onto the skin every 3 (three) days. Most last 30 days.  Dispense: 10 patch; Refill: 0 - fentaNYL (DURAGESIC) 50 MCG/HR; Place 1 patch onto the skin every 3 (three)  days. Most last 30 days.  Dispense: 10 patch; Refill: 0 - fentaNYL (DURAGESIC) 50 MCG/HR; Place 1 patch onto the skin every 3 (three) days. Most last 30 days.  Dispense: 10 patch; Refill: 0  14. Piriformis syndrome of right side - fentaNYL (DURAGESIC) 50 MCG/HR; Place 1 patch onto the skin every 3 (three) days. Most last 30 days.  Dispense: 10 patch; Refill: 0 - fentaNYL (DURAGESIC) 50 MCG/HR; Place 1 patch onto the skin every 3 (three) days. Most last 30 days.  Dispense: 10 patch; Refill: 0 - fentaNYL (DURAGESIC) 50 MCG/HR; Place 1 patch onto the skin every 3 (three) days. Most last 30 days.  Dispense: 10 patch; Refill: 0  15. Contusion of right hip, sequela - fentaNYL (DURAGESIC) 50 MCG/HR; Place 1 patch onto the skin every 3 (three) days. Most last 30 days.  Dispense: 10 patch; Refill: 0 - fentaNYL (DURAGESIC) 50 MCG/HR; Place 1 patch onto the skin every 3 (three) days. Most last 30 days.  Dispense: 10 patch; Refill: 0 - fentaNYL (DURAGESIC) 50 MCG/HR; Place 1 patch onto the skin every 3 (three) days. Most last 30 days.  Dispense: 10 patch; Refill: 0     Pharmacotherapy (Medications Ordered): Meds ordered this encounter  Medications   botulinum toxin Type A (BOTOX) injection 155 Units   fentaNYL (DURAGESIC) 50 MCG/HR    Sig: Place 1 patch onto the skin every 3 (three) days. Most last 30 days.    Dispense:  10 patch    Refill:  0    [place] STOP ACT - Not applicable. Fill one day early if pharmacy is closed on scheduled refill date.   fentaNYL (DURAGESIC) 50 MCG/HR    Sig: Place 1 patch onto the skin every 3 (three) days. Most last  30 days.    Dispense:  10 patch    Refill:  0    [place] STOP ACT - Not applicable. Fill one day early if pharmacy is closed on scheduled refill date.   fentaNYL (DURAGESIC) 50 MCG/HR    Sig: Place 1 patch onto the skin every 3 (three) days. Most last 30 days.    Dispense:  10 patch    Refill:  0    [place] STOP ACT - Not applicable. Fill one day early if pharmacy is  closed on scheduled refill date.   Follow-up for Botox for migraine management  No orders of the defined types were placed in this encounter.  Lyrica managed by neurology: 100 mg TID   Follow-up plan:   Return in about 3 months (around 05/22/2023) for MM, F2F Botox injections .   Recent Visits No visits were found meeting these conditions. Showing recent visits within past 90 days and meeting all other requirements Today's Visits Date Type Provider Dept  02/19/23 Procedure visit Edward Jolly, MD [facility]  Showing today's visits and meeting all other requirements Future Appointments No visits were found meeting these conditions. Showing future appointments within next 90 days and meeting all other requirements  I discussed the assessment and treatment plan with the patient. The patient was provided an opportunity to ask questions and all were answered. The patient agreed with the plan and demonstrated an understanding of the instructions.  Patient advised to call back or seek an in-person evaluation if the symptoms or condition worsens.  Duration of encounter: 35 minutes.  Note by: Edward Jolly, MD Date: 02/19/2023; Time: 12:44 PM

## 2023-02-20 ENCOUNTER — Telehealth: Payer: Self-pay

## 2023-02-20 NOTE — Telephone Encounter (Signed)
Post procedure follow up.  LM 

## 2023-05-26 ENCOUNTER — Encounter: Payer: Self-pay | Admitting: Student in an Organized Health Care Education/Training Program

## 2023-05-26 ENCOUNTER — Ambulatory Visit
Payer: Medicaid Other | Attending: Student in an Organized Health Care Education/Training Program | Admitting: Student in an Organized Health Care Education/Training Program

## 2023-05-26 VITALS — HR 98 | Temp 97.2°F | Resp 16 | Ht 64.0 in | Wt 173.0 lb

## 2023-05-26 DIAGNOSIS — S7001XS Contusion of right hip, sequela: Secondary | ICD-10-CM | POA: Insufficient documentation

## 2023-05-26 DIAGNOSIS — M47812 Spondylosis without myelopathy or radiculopathy, cervical region: Secondary | ICD-10-CM | POA: Insufficient documentation

## 2023-05-26 DIAGNOSIS — G43709 Chronic migraine without aura, not intractable, without status migrainosus: Secondary | ICD-10-CM | POA: Insufficient documentation

## 2023-05-26 DIAGNOSIS — G894 Chronic pain syndrome: Secondary | ICD-10-CM | POA: Diagnosis not present

## 2023-05-26 DIAGNOSIS — Z981 Arthrodesis status: Secondary | ICD-10-CM | POA: Insufficient documentation

## 2023-05-26 DIAGNOSIS — G9050 Complex regional pain syndrome I, unspecified: Secondary | ICD-10-CM | POA: Diagnosis present

## 2023-05-26 DIAGNOSIS — M533 Sacrococcygeal disorders, not elsewhere classified: Secondary | ICD-10-CM | POA: Diagnosis not present

## 2023-05-26 DIAGNOSIS — G5701 Lesion of sciatic nerve, right lower limb: Secondary | ICD-10-CM | POA: Diagnosis present

## 2023-05-26 DIAGNOSIS — M47816 Spondylosis without myelopathy or radiculopathy, lumbar region: Secondary | ICD-10-CM | POA: Insufficient documentation

## 2023-05-26 DIAGNOSIS — T85192S Other mechanical complication of implanted electronic neurostimulator (electrode) of spinal cord, sequela: Secondary | ICD-10-CM | POA: Diagnosis not present

## 2023-05-26 DIAGNOSIS — Q761 Klippel-Feil syndrome: Secondary | ICD-10-CM | POA: Insufficient documentation

## 2023-05-26 DIAGNOSIS — M4802 Spinal stenosis, cervical region: Secondary | ICD-10-CM | POA: Diagnosis present

## 2023-05-26 MED ORDER — FENTANYL 50 MCG/HR TD PT72: 1.0000 | MEDICATED_PATCH | TRANSDERMAL | 0 refills | Status: AC

## 2023-05-26 MED ORDER — FENTANYL 50 MCG/HR TD PT72: 1.0000 | MEDICATED_PATCH | TRANSDERMAL | 0 refills | Status: DC

## 2023-05-26 MED ORDER — ONABOTULINUMTOXINA 200 UNITS IJ SOLR
155.0000 [IU] | Freq: Once | INTRAMUSCULAR | Status: AC
  Administered 2023-05-26: 155 [IU] via INTRAMUSCULAR
  Filled 2023-05-26: qty 200

## 2023-05-26 MED ORDER — SODIUM CHLORIDE (PF) 0.9 % IJ SOLN
INTRAMUSCULAR | Status: AC
  Filled 2023-05-26: qty 10

## 2023-05-26 NOTE — Progress Notes (Signed)
Nursing Pain Medication Assessment:  Safety precautions to be maintained throughout the outpatient stay will include: orient to surroundings, keep bed in low position, maintain call bell within reach at all times, provide assistance with transfer out of bed and ambulation.  Medication Inspection Compliance: Pill count conducted under aseptic conditions, in front of the patient. Neither the pills nor the bottle was removed from the patient's sight at any time. Once count was completed pills were immediately returned to the patient in their original bottle.  Medication: Fentanyl patch Pill/Patch Count:  3 of 10 patches remain Pill/Patch Appearance: Markings consistent with prescribed medication Bottle Appearance: Standard pharmacy container. Clearly labeled. Filled Date: 01 / 08 / 2025 Last Medication intake:  Today

## 2023-05-26 NOTE — Progress Notes (Signed)
PROVIDER NOTE: Information contained herein reflects review and annotations entered in association with encounter. Interpretation of such information and data should be left to medically-trained personnel. Information provided to patient can be located elsewhere in the medical record under "Patient Instructions". Document created using STT-dictation technology, any transcriptional errors that may result from process are unintentional.    Patient: Lindsay Austin  Service Category: E/M  Provider: Edward Jolly, MD  DOB: 01-Jun-1960  DOS: 05/26/2023  Specialty: Interventional Pain Management  MRN: [id number]  Setting: Ambulatory outpatient  PCP: Shane Crutch, PA  Type: Established Patient    Referring Provider: Shane Crutch, PA  Location: Office  Delivery: Face-to-face     HPI  [name], a 63 y.o. year old female, is here today because of her Chronic migraine without aura without status migrainosus, not intractable [G43.709]. [name] primary complain today is Migraine  Last encounter: My last encounter with her was on 02/19/23  Pertinent problems: Lindsay Austin has Hx of lumbosacral spine surgery; History of lumbar fusion (L5-S1 fusion); Failed spinal cord stimulator, sequela; Cervical fusion syndrome (C4-C7); Chronic pain syndrome; Major depressive disorder with current active episode; Lumbar spondylosis; Cervical spondylosis without myelopathy; and Spinal stenosis in cervical region on their pertinent problem list. Pain Assessment: Severity of Chronic pain is reported as a 3 /10. Location: Head Anterior/Denies. Onset: More than a month ago. Quality: Throbbing, Sharp, Shooting. Timing: Intermittent. Modifying factor(s): Botox and laying down in bed. Vitals:  height is 5\' 4"  (1.626 m) and weight is 173 lb (78.5 kg). Her temporal temperature is 97.2 F (36.2 C) (abnormal). Her pulse is 98. Her respiration is 16 and oxygen saturation is 98%.   Reason for encounter: medication management.    Patient  presents today for medication management.  No significant change in her medical history.  She notes that her headache frequency and intensity is getting worse.  She is also here for Botox.  See attached procedure note.  Pharmacotherapy Assessment  Analgesic:  fentanyl patch  50 mcg an hour; 120 MME.   Monitoring: Lindsay Austin PMP: PDMP reviewed during this encounter.       Pharmacotherapy: No side-effects or adverse reactions reported. Compliance: No problems identified. Effectiveness: Clinically acceptable.  UDS:  Summary  Date Value Ref Range Status  05/07/2022 Note  Final    Comment:    ==================================================================== ToxASSURE Select 13 (MW) ==================================================================== Test                             Result       Flag       Units  Drug Present and Declared for Prescription Verification   Fentanyl                       148          EXPECTED   ng/mg creat   Norfentanyl                    632          EXPECTED   ng/mg creat    Source of fentanyl is a scheduled prescription medication, including    IV, patch, and transmucosal formulations. Norfentanyl is an expected    metabolite of fentanyl.  ==================================================================== Test                      Result    Flag   Units  Ref Range   Creatinine              71               mg/dL      >=16 ==================================================================== Declared Medications:  The flagging and interpretation on this report are based on the  following declared medications.  Unexpected results may arise from  inaccuracies in the declared medications.   **Note: The testing scope of this panel includes these medications:   Fentanyl (Duragesic)   **Note: The testing scope of this panel does not include the  following reported medications:   Albuterol (Ventolin HFA)  Bupropion (Wellbutrin)  Clopidogrel (Plavix)   Dicyclomine (Bentyl)  Methocarbamol (Robaxin)  Naloxone (Narcan)  Ondansetron (Zofran)  Pregabalin (Lyrica)  Risperidone (Risperdal)  Rizatriptan (Maxalt)  Sucralfate (Carafate)  Trazodone (Desyrel) ==================================================================== For clinical consultation, please call [phone]. ====================================================================       ROS  Constitutional: Denies any fever or chills Gastrointestinal: No reported hemesis, hematochezia, vomiting, or acute GI distress Musculoskeletal: Low back pain Neurological:  Migraines  Medication Review  DSS, albuterol, atorvastatin, buPROPion, clopidogrel, dicyclomine, fentaNYL, methocarbamol, naloxone, ondansetron, risperiDONE, rizatriptan, sucralfate, and traZODone  History Review  Allergy: [name] is allergic to methadone, other, and tape. Drug: [name]  reports no history of drug use. Alcohol:  reports no history of alcohol use. Tobacco:  reports that she has never smoked. She has never used smokeless tobacco. Social: [name]  reports that she has never smoked. She has never used smokeless tobacco. She reports that she does not drink alcohol and does not use drugs. Medical:  has a past medical history of Anxiety, Arthritis, Bladder cancer (HCC), Cancer of kidney (HCC), DDD (degenerative disc disease), cervical, Depression, Failed back syndrome, Fibromyalgia, GERD (gastroesophageal reflux disease), Headache, Heart murmur, Hyperlipemia, Hypertension, IBS (irritable bowel syndrome), Kidney failure, Renal insufficiency, RSD (reflex sympathetic dystrophy), Seizure (HCC), Stomach ulcer, and Trigeminal neuralgia. Surgical: [name]  has a past surgical history that includes Transurethral resection of bladder tumor with gyrus (turbt-gyrus) (2012, 2013); Nephroureterectomy (2011); Back surgery (1995); Cholecystectomy; Brain surgery (2004); trigeminal neuralgia (2004); Spinal cord stimulator  implant (2004); colonscopy (2006); Posterior cervical fusion/foraminotomy (Bilateral, 07/05/2019); Posterior cervical laminectomy (Bilateral, 07/05/2019); and Breast biopsy (Left, 2017). Family: family history includes Dementia in her father; Diabetes in her mother; Heart disease in her mother; Kidney disease in her father.  Laboratory Chemistry Profile   Renal Lab Results  Component Value Date   BUN 13 12/29/2020   CREATININE 1.44 (H) 12/29/2020   LABCREA 71 08/29/2019   GFRAA 51 (L) 08/31/2019   GFRNONAA 42 (L) 12/29/2020     Hepatic Lab Results  Component Value Date   AST 32 08/29/2019   ALT 12 08/29/2019   ALBUMIN 4.1 08/29/2019   ALKPHOS 66 08/29/2019     Electrolytes Lab Results  Component Value Date   NA 139 12/29/2020   K 4.3 12/29/2020   CL 101 12/29/2020   CALCIUM 9.2 12/29/2020   MG 2.2 08/31/2019     Bone No results found for: "VD25OH", "VD125OH2TOT", "WJ1914NW2", "NF6213YQ6", "25OHVITD1", "25OHVITD2", "25OHVITD3", "TESTOFREE", "TESTOSTERONE"   Inflammation (CRP: Acute Phase) (ESR: Chronic Phase) No results found for: "CRP", "ESRSEDRATE", "LATICACIDVEN"     Note: Above Lab results reviewed.   Physical Exam  General appearance: Well nourished, well developed, and well hydrated. In no apparent acute distress Mental status: Alert, oriented x 3 (person, place, & time)       Respiratory: No evidence of  acute respiratory distress Eyes: PERLA Vitals: Pulse 98   Temp (!) 97.2 F (36.2 C) (Temporal)   Resp 16   Ht 5\' 4"  (1.626 m)   Wt 173 lb (78.5 kg)   SpO2 98%   BMI 29.70 kg/m  BMI: Estimated body mass index is 29.7 kg/m as calculated from the following:   Height as of this encounter: 5\' 4"  (1.626 m).   Weight as of this encounter: 173 lb (78.5 kg). Ideal: Ideal body weight: 54.7 kg (120 lb 9.5 oz) Adjusted ideal body weight: 64.2 kg (141 lb 8.9 oz)  Lumbar Spine Area Exam  Skin & Axial Inspection: Well healed scar from previous spine surgery  detected Alignment: Symmetrical Functional ROM: Pain restricted ROM affecting primarily the right Stability: No instability detected Muscle Tone/Strength: Functionally intact. No obvious neuro-muscular anomalies detected. Sensory (Neurological): Musculoskeletal pain pattern    Gait & Posture Assessment  Ambulation: antalgic Gait: Limited. Using assistive device to ambulate Posture: Difficulty standing up straight, due to pain    Lower Extremity Exam      Side: Right lower extremity   Side: Left lower extremity  Stability: No instability observed           Stability: No instability observed          Skin & Extremity Inspection: Skin color, temperature, and hair growth are WNL. No peripheral edema or cyanosis. No masses, redness, swelling, asymmetry, or associated skin lesions. No contractures.   Skin & Extremity Inspection: Skin color, temperature, and hair growth are WNL. No peripheral edema or cyanosis. No masses, redness, swelling, asymmetry, or associated skin lesions. No contractures.  Functional ROM: Pain restricted ROM for all joints of the lower extremity           Functional ROM: Pain restricted ROM for all joints of the lower extremity          Muscle Tone/Strength: Functionally intact. No obvious neuro-muscular anomalies detected.   Muscle Tone/Strength: Functionally intact. No obvious neuro-muscular anomalies detected.  Sensory (Neurological): Unimpaired         Sensory (Neurological): Unimpaired        DTR: Patellar: deferred today Achilles: deferred today Plantar: deferred today   DTR: Patellar: deferred today Achilles: deferred today Plantar: deferred today  Palpation: No palpable anomalies   Palpation: No palpable anomalies       Assessment   Status Diagnosis  Controlled Controlled Controlled 1. Chronic migraine without aura without status migrainosus, not intractable   2. Sacroiliac joint dysfunction of right side   3. Sacroiliac joint dysfunction of left side    4. Failed spinal cord stimulator, sequela   5. History of lumbar fusion   6. Chronic pain syndrome   7. Cervical spondylosis without myelopathy   8. Spinal stenosis in cervical region   9. Lumbar spondylosis   10. Cervical fusion syndrome (C4-T2)   11. RSD (reflex sympathetic dystrophy)   12. Lumbar facet arthropathy   13. Cervical facet joint syndrome   14. Piriformis syndrome of right side   15. Contusion of right hip, sequela        Plan of Care  Ms. Saaya Procell has a current medication list which includes the following long-term medication(s): albuterol, atorvastatin, bupropion, dicyclomine, risperidone, rizatriptan, sucralfate, and trazodone.  1. Chronic migraine without aura without status migrainosus, not intractable (Primary) - Chemo Denervation; Future  2. Sacroiliac joint dysfunction of right side - fentaNYL (DURAGESIC) 50 MCG/HR; Place 1 patch onto the skin every 3 (three) days.  Most last 30 days.  Dispense: 10 patch; Refill: 0 - fentaNYL (DURAGESIC) 50 MCG/HR; Place 1 patch onto the skin every 3 (three) days. Most last 30 days.  Dispense: 10 patch; Refill: 0 - fentaNYL (DURAGESIC) 50 MCG/HR; Place 1 patch onto the skin every 3 (three) days. Most last 30 days.  Dispense: 10 patch; Refill: 0  3. Sacroiliac joint dysfunction of left side  4. Failed spinal cord stimulator, sequela - fentaNYL (DURAGESIC) 50 MCG/HR; Place 1 patch onto the skin every 3 (three) days. Most last 30 days.  Dispense: 10 patch; Refill: 0 - fentaNYL (DURAGESIC) 50 MCG/HR; Place 1 patch onto the skin every 3 (three) days. Most last 30 days.  Dispense: 10 patch; Refill: 0 - fentaNYL (DURAGESIC) 50 MCG/HR; Place 1 patch onto the skin every 3 (three) days. Most last 30 days.  Dispense: 10 patch; Refill: 0  5. History of lumbar fusion - fentaNYL (DURAGESIC) 50 MCG/HR; Place 1 patch onto the skin every 3 (three) days. Most last 30 days.  Dispense: 10 patch; Refill: 0 - fentaNYL (DURAGESIC) 50 MCG/HR;  Place 1 patch onto the skin every 3 (three) days. Most last 30 days.  Dispense: 10 patch; Refill: 0 - fentaNYL (DURAGESIC) 50 MCG/HR; Place 1 patch onto the skin every 3 (three) days. Most last 30 days.  Dispense: 10 patch; Refill: 0  6. Chronic pain syndrome - ToxASSURE Select 13 (MW), Urine - fentaNYL (DURAGESIC) 50 MCG/HR; Place 1 patch onto the skin every 3 (three) days. Most last 30 days.  Dispense: 10 patch; Refill: 0 - fentaNYL (DURAGESIC) 50 MCG/HR; Place 1 patch onto the skin every 3 (three) days. Most last 30 days.  Dispense: 10 patch; Refill: 0 - fentaNYL (DURAGESIC) 50 MCG/HR; Place 1 patch onto the skin every 3 (three) days. Most last 30 days.  Dispense: 10 patch; Refill: 0  7. Cervical spondylosis without myelopathy - fentaNYL (DURAGESIC) 50 MCG/HR; Place 1 patch onto the skin every 3 (three) days. Most last 30 days.  Dispense: 10 patch; Refill: 0 - fentaNYL (DURAGESIC) 50 MCG/HR; Place 1 patch onto the skin every 3 (three) days. Most last 30 days.  Dispense: 10 patch; Refill: 0 - fentaNYL (DURAGESIC) 50 MCG/HR; Place 1 patch onto the skin every 3 (three) days. Most last 30 days.  Dispense: 10 patch; Refill: 0  8. Spinal stenosis in cervical region - fentaNYL (DURAGESIC) 50 MCG/HR; Place 1 patch onto the skin every 3 (three) days. Most last 30 days.  Dispense: 10 patch; Refill: 0 - fentaNYL (DURAGESIC) 50 MCG/HR; Place 1 patch onto the skin every 3 (three) days. Most last 30 days.  Dispense: 10 patch; Refill: 0 - fentaNYL (DURAGESIC) 50 MCG/HR; Place 1 patch onto the skin every 3 (three) days. Most last 30 days.  Dispense: 10 patch; Refill: 0  9. Lumbar spondylosis - fentaNYL (DURAGESIC) 50 MCG/HR; Place 1 patch onto the skin every 3 (three) days. Most last 30 days.  Dispense: 10 patch; Refill: 0 - fentaNYL (DURAGESIC) 50 MCG/HR; Place 1 patch onto the skin every 3 (three) days. Most last 30 days.  Dispense: 10 patch; Refill: 0 - fentaNYL (DURAGESIC) 50 MCG/HR; Place 1 patch onto  the skin every 3 (three) days. Most last 30 days.  Dispense: 10 patch; Refill: 0  10. Cervical fusion syndrome (C4-T2) - fentaNYL (DURAGESIC) 50 MCG/HR; Place 1 patch onto the skin every 3 (three) days. Most last 30 days.  Dispense: 10 patch; Refill: 0 - fentaNYL (DURAGESIC) 50 MCG/HR; Place 1 patch  onto the skin every 3 (three) days. Most last 30 days.  Dispense: 10 patch; Refill: 0 - fentaNYL (DURAGESIC) 50 MCG/HR; Place 1 patch onto the skin every 3 (three) days. Most last 30 days.  Dispense: 10 patch; Refill: 0  11. RSD (reflex sympathetic dystrophy) - fentaNYL (DURAGESIC) 50 MCG/HR; Place 1 patch onto the skin every 3 (three) days. Most last 30 days.  Dispense: 10 patch; Refill: 0 - fentaNYL (DURAGESIC) 50 MCG/HR; Place 1 patch onto the skin every 3 (three) days. Most last 30 days.  Dispense: 10 patch; Refill: 0 - fentaNYL (DURAGESIC) 50 MCG/HR; Place 1 patch onto the skin every 3 (three) days. Most last 30 days.  Dispense: 10 patch; Refill: 0  12. Lumbar facet arthropathy - fentaNYL (DURAGESIC) 50 MCG/HR; Place 1 patch onto the skin every 3 (three) days. Most last 30 days.  Dispense: 10 patch; Refill: 0 - fentaNYL (DURAGESIC) 50 MCG/HR; Place 1 patch onto the skin every 3 (three) days. Most last 30 days.  Dispense: 10 patch; Refill: 0 - fentaNYL (DURAGESIC) 50 MCG/HR; Place 1 patch onto the skin every 3 (three) days. Most last 30 days.  Dispense: 10 patch; Refill: 0  13. Cervical facet joint syndrome - fentaNYL (DURAGESIC) 50 MCG/HR; Place 1 patch onto the skin every 3 (three) days. Most last 30 days.  Dispense: 10 patch; Refill: 0 - fentaNYL (DURAGESIC) 50 MCG/HR; Place 1 patch onto the skin every 3 (three) days. Most last 30 days.  Dispense: 10 patch; Refill: 0 - fentaNYL (DURAGESIC) 50 MCG/HR; Place 1 patch onto the skin every 3 (three) days. Most last 30 days.  Dispense: 10 patch; Refill: 0  14. Piriformis syndrome of right side - fentaNYL (DURAGESIC) 50 MCG/HR; Place 1 patch onto  the skin every 3 (three) days. Most last 30 days.  Dispense: 10 patch; Refill: 0 - fentaNYL (DURAGESIC) 50 MCG/HR; Place 1 patch onto the skin every 3 (three) days. Most last 30 days.  Dispense: 10 patch; Refill: 0 - fentaNYL (DURAGESIC) 50 MCG/HR; Place 1 patch onto the skin every 3 (three) days. Most last 30 days.  Dispense: 10 patch; Refill: 0  15. Contusion of right hip, sequela - fentaNYL (DURAGESIC) 50 MCG/HR; Place 1 patch onto the skin every 3 (three) days. Most last 30 days.  Dispense: 10 patch; Refill: 0 - fentaNYL (DURAGESIC) 50 MCG/HR; Place 1 patch onto the skin every 3 (three) days. Most last 30 days.  Dispense: 10 patch; Refill: 0 - fentaNYL (DURAGESIC) 50 MCG/HR; Place 1 patch onto the skin every 3 (three) days. Most last 30 days.  Dispense: 10 patch; Refill: 0      Pharmacotherapy (Medications Ordered): Meds ordered this encounter  Medications   fentaNYL (DURAGESIC) 50 MCG/HR    Sig: Place 1 patch onto the skin every 3 (three) days. Most last 30 days.    Dispense:  10 patch    Refill:  0    [place] STOP ACT - Not applicable. Fill one day early if pharmacy is closed on scheduled refill date.   fentaNYL (DURAGESIC) 50 MCG/HR    Sig: Place 1 patch onto the skin every 3 (three) days. Most last 30 days.    Dispense:  10 patch    Refill:  0    [place] STOP ACT - Not applicable. Fill one day early if pharmacy is closed on scheduled refill date.   fentaNYL (DURAGESIC) 50 MCG/HR    Sig: Place 1 patch onto the skin every 3 (three) days. Most  last 30 days.    Dispense:  10 patch    Refill:  0    [place] STOP ACT - Not applicable. Fill one day early if pharmacy is closed on scheduled refill date.   botulinum toxin Type A (BOTOX) injection 155 Units    For migraine management, performed by MD   Follow-up for Botox for migraine management  Orders Placed This Encounter  Procedures   ToxASSURE Select 13 (MW), Urine    Volume: 30 ml(s). Minimum 3 ml of urine is needed. Document temperature  of fresh sample. Indications: Long term (current) use of opiate analgesic (Z61.096)    Release to patient:   Immediate   Chemo Denervation    Standing Status:   Future    Expiration Date:   05/25/2024    Scheduling Instructions:     Botox for migraines     Dr Cherylann Ratel [facility]    Where should this test be performed?:   other     Follow-up plan:   Return in about 3 months (around 08/24/2023) for MM + Botox (on procedure day).   Recent Visits No visits were found meeting these conditions. Showing recent visits within past 90 days and meeting all other requirements Today's Visits Date Type Provider Dept  05/26/23 Procedure visit Edward Jolly, MD [facility]  Showing today's visits and meeting all other requirements Future Appointments No visits were found meeting these conditions. Showing future appointments within next 90 days and meeting all other requirements  I discussed the assessment and treatment plan with the patient. The patient was provided an opportunity to ask questions and all were answered. The patient agreed with the plan and demonstrated an understanding of the instructions.  Patient advised to call back or seek an in-person evaluation if the symptoms or condition worsens.  Duration of encounter: 35 minutes.  Note by: Edward Jolly, MD Date: 05/26/2023; Time: 1:52 PM

## 2023-05-26 NOTE — Progress Notes (Addendum)
PROVIDER NOTE: Information contained herein reflects review and annotations entered in association with encounter. Interpretation of such information and data should be left to medically-trained personnel. Information provided to patient can be located elsewhere in the medical record under "Patient Instructions". Document created using STT-dictation technology, any transcriptional errors that may result from process are unintentional.    Patient: Lindsay Austin  Service Category: Procedure  Provider: Edward Jolly, MD  DOB: July 11, 1960  DOS: 05/26/2023  Location: [facility]  MRN: [id number]  Setting: Ambulatory - outpatient  Referring Provider: Shane Crutch, PA  Type: Established Patient  Specialty: Interventional Pain Management  PCP: Shane Crutch, PA   Primary Reason for Visit: Interventional Pain Management Treatment. CC: migraine     Procedure:            Botox for migraine management #7        1. Chronic migraine without aura without status migrainosus, not intractable   2. Sacroiliac joint dysfunction of right side   3. Sacroiliac joint dysfunction of left side   4. Failed spinal cord stimulator, sequela   5. History of lumbar fusion   6. Chronic pain syndrome   7. Cervical spondylosis without myelopathy   8. Spinal stenosis in cervical region   9. Lumbar spondylosis   10. Cervical fusion syndrome (C4-T2)   11. RSD (reflex sympathetic dystrophy)   12. Lumbar facet arthropathy   13. Cervical facet joint syndrome   14. Piriformis syndrome of right side   15. Contusion of right hip, sequela     Pain Score: Pre-procedure: 3 /10 Post-procedure: 3 /10     Pre-op H&P Assessment:  Lindsay Austin is a 63 y.o. (year old), female patient, seen today for interventional treatment. 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 Lindsay Austin has a current medication list which includes the following prescription(s): albuterol, atorvastatin, bupropion, clopidogrel, dicyclomine, dss, methocarbamol, naloxone, ondansetron, risperidone, rizatriptan, sucralfate, trazodone, [START ON 06/06/2023] fentanyl, [START ON 07/06/2023] fentanyl, and [START ON 08/05/2023] fentanyl. Her primarily concern today is the Migraine   Initial Vital Signs:  Pulse/HCG Rate: 98  Temp: (!) 97.2 F (36.2 C) Resp: 16 BP:   SpO2: 98 %  BMI: Estimated body mass index is 29.7 kg/m as calculated from the following:   Height as of this encounter: 5\' 4"  (1.626 m).   Weight as of this encounter: 173 lb (78.5 kg).  Risk Assessment: Allergies: Reviewed. She is allergic to methadone, other, and tape.  Allergy Precautions: None required Coagulopathies: Reviewed. None identified.  Blood-thinner therapy: None at this time Active Infection(s): Reviewed. None identified. Lindsay Austin is afebrile  Site Confirmation: Lindsay Austin was asked to confirm the procedure and laterality before marking the site Procedure checklist: Completed Consent: Before the procedure and under the influence of no sedative(s), amnesic(s), or anxiolytics, the patient was informed of the treatment options, risks and possible complications. To fulfill our ethical and legal obligations, as recommended by the American Medical Association's Code of Ethics, I have informed the patient of my clinical impression; the nature and purpose of the treatment or procedure; the risks, benefits, and possible complications of the intervention; the alternatives, including doing nothing; the risk(s) and benefit(s) of the alternative treatment(s) or procedure(s);  and the risk(s)  and benefit(s) of doing nothing. The patient was provided information about the general risks and possible complications associated with the procedure. These may include, but are not limited to: failure to achieve desired goals, infection, bleeding, organ or nerve damage, allergic reactions, paralysis, and death. In addition, the patient was informed of those risks and complications associated to the procedure, such as failure to decrease pain; infection; bleeding; organ or nerve damage with subsequent damage to sensory, motor, and/or autonomic systems, resulting in permanent pain, numbness, and/or weakness of one or several areas of the body; allergic reactions; (i.e.: anaphylactic reaction); and/or death. Furthermore, the patient was informed of those risks and complications associated with the medications. These include, but are not limited to: allergic reactions (i.e.: anaphylactic or anaphylactoid reaction(s)); adrenal axis suppression; blood sugar elevation that in diabetics may result in ketoacidosis or comma; water retention that in patients with history of congestive heart failure may result in shortness of breath, pulmonary edema, and decompensation with resultant heart failure; weight gain; swelling or edema; medication-induced neural toxicity; particulate matter embolism and blood vessel occlusion with resultant organ, and/or nervous system infarction; and/or aseptic necrosis of one or more joints. Finally, the patient was informed that Medicine is not an exact science; therefore, there is also the possibility of unforeseen or unpredictable risks and/or possible complications that may result in a catastrophic outcome. The patient indicated having understood very clearly. We have given the patient no guarantees and we have made no promises. Enough time was given to the patient to ask questions, all of which were answered to the patient's satisfaction. Lindsay Austin has indicated that she wanted to continue  with the procedure. Attestation: I, the ordering provider, attest that I have discussed with the patient the benefits, risks, side-effects, alternatives, likelihood of achieving goals, and potential problems during recovery for the procedure that I have provided informed consent. Date  Time: 05/26/2023 12:41 PM  Pre-Procedure Preparation:  Monitoring: As per clinic protocol. Respiration, ETCO2, SpO2, BP, heart rate and rhythm monitor placed and checked for adequate function Safety Precautions: Patient was assessed for positional comfort and pressure points before starting the procedure. Time-out: I initiated and conducted the "Time-out" before starting the procedure, as per protocol. The patient was asked to participate by confirming the accuracy of the "Time Out" information. Verification of the correct person, site, and procedure were performed and confirmed by me, the nursing staff, and the patient. "Time-out" conducted as per Joint Commission's Universal Protocol (UP.01.01.01). Time: 1342  Constitution and injection details as below:   1 x 200 unit vial of Botox brand onabotulinum toxin A was diluted with 4 mL preservative free saline, for a final concentration of 5 units/0.1 mL. Injection sites were prepared for injection with alcohol swab  Botox was injected as follows:  5 units left corrugator 5 units right corrugator 5 units procerus  5 units in 2 sites, right frontalis (10 units total) 5units in 2 sites, left frontalis (10 units total) 5 units in each of 4 sites right temporalis (20 units total) 5 units in each of 4 sites left temporalis (20 units total) 5 units in each of 3 sites right occipitalis (15 units total) 5 units in each of 3 sites left occipitalis (15 units total) 5 units in each of 2 sites right cervical paraspinal at approximate C3-4 level (10 units total) 5 units in each of 2 sites left cervical paraspinal muscles at approximate C3-4 level (10 units total) 5 units  in each of 3 sites right trapezius (15 units total) 5 units in each of 3 sites left trapezius (15 units total)  45 units were wasted  A total of 155 units was administered by Dr Cherylann Ratel        Post-operative Assessment:  Post-procedure Vital Signs:  Pulse/HCG Rate: 98  Temp: (!) 97.2 F (36.2 C) Resp: 16 BP:   SpO2: 98 %  EBL: None  Complications: No immediate post-treatment complications observed by team, or reported by patient.  Note: The patient tolerated the entire procedure well. A repeat set of vitals were taken after the procedure and the patient was kept under observation following institutional policy, for this type of procedure. Post-procedural neurological assessment was performed, showing return to baseline, prior to discharge. The patient was provided with post-procedure discharge instructions, including a section on how to identify potential problems. Should any problems arise concerning this procedure, the patient was given instructions to immediately contact us, at any time, without hesitation. In any case, we plan to contact the patient by telephone for a follow-up status report regarding this interventional procedure.  Comments:  No additional relevant information.  Plan of Care   Chronic Opioid Analgesic:   fentanyl patch  50 mcg an hour; 120 MME.   Medications ordered for procedure: Meds ordered this encounter  Medications   fentaNYL (DURAGESIC) 50 MCG/HR    Sig: Place 1 patch onto the skin every 3 (three) days. Most last 30 days.    Dispense:  10 patch    Refill:  0    [place] STOP ACT - Not applicable. Fill one day early if pharmacy is closed on scheduled refill date.   fentaNYL (DURAGESIC) 50 MCG/HR    Sig: Place 1 patch onto the skin every 3 (three) days. Most last 30 days.    Dispense:  10 patch    Refill:  0    [place] STOP ACT - Not applicable. Fill one day early if pharmacy is closed on scheduled refill date.   fentaNYL (DURAGESIC) 50 MCG/HR    Sig:  Place 1 patch onto the skin every 3 (three) days. Most last 30 days.    Dispense:  10 patch    Refill:  0    [place] STOP ACT - Not applicable. Fill one day early if pharmacy is closed on scheduled refill date.   botulinum toxin Type A (BOTOX) injection 155 Units    For migraine management, performed by MD   Medications administered: We administered botulinum toxin Type A.  See the medical record for exact dosing, route, and time of administration.  Follow-up plan:   Return in about 3 months (around 08/24/2023) for MM + Botox (on procedure day).     Recent Visits No visits were found meeting these conditions. Showing recent visits within past 90 days and meeting all other requirements Today's Visits Date Type Provider Dept  05/26/23 Procedure visit Edward Jolly, MD [facility]  Showing today's visits and meeting all other requirements Future Appointments No visits were found meeting these conditions. Showing future appointments within next 90 days and meeting all other requirements  Disposition: Discharge home  Discharge (Date  Time): 05/26/2023;   hrs.   Primary Care Physician: Shane Crutch, [place] Location: [facility] Note by: Edward Jolly, MD Date: 05/26/2023; Time: 1:52 PM  Disclaimer:  Medicine is not an exact science. The only guarantee in medicine is that nothing is guaranteed. It is important to note that the decision  to proceed with this intervention was based on the information collected from the patient. The Data and conclusions were drawn from the patient's questionnaire, the interview, and the physical examination. Because the information was provided in large part by the patient, it cannot be guaranteed that it has not been purposely or unconsciously manipulated. Every effort has been made to obtain as much relevant data as possible for this evaluation. It is important to note that the conclusions that lead to this procedure are derived  in large part from the available data. Always take into account that the treatment will also be dependent on availability of resources and existing treatment guidelines, considered by other Pain Management Practitioners as being common knowledge and practice, at the time of the intervention. For Medico-Legal purposes, it is also important to point out that variation in procedural techniques and pharmacological choices are the acceptable norm. The indications, contraindications, technique, and results of the above procedure should only be interpreted and judged by a Board-Certified Interventional Pain Specialist with extensive familiarity and expertise in the same exact procedure and technique.

## 2023-05-27 ENCOUNTER — Telehealth: Payer: Self-pay | Admitting: *Deleted

## 2023-05-27 NOTE — Telephone Encounter (Signed)
Attempted to call for post procedure follow-up. Message left.

## 2023-05-28 LAB — TOXASSURE SELECT 13 (MW), URINE

## 2023-08-20 ENCOUNTER — Encounter: Payer: Self-pay | Admitting: Student in an Organized Health Care Education/Training Program

## 2023-08-20 ENCOUNTER — Ambulatory Visit
Payer: Medicaid Other | Attending: Student in an Organized Health Care Education/Training Program | Admitting: Student in an Organized Health Care Education/Training Program

## 2023-08-20 VITALS — BP 110/78 | HR 61 | Temp 97.5°F | Resp 16 | Ht 64.0 in | Wt 173.0 lb

## 2023-08-20 DIAGNOSIS — M47816 Spondylosis without myelopathy or radiculopathy, lumbar region: Secondary | ICD-10-CM | POA: Diagnosis present

## 2023-08-20 DIAGNOSIS — M47812 Spondylosis without myelopathy or radiculopathy, cervical region: Secondary | ICD-10-CM | POA: Diagnosis present

## 2023-08-20 DIAGNOSIS — Z981 Arthrodesis status: Secondary | ICD-10-CM | POA: Diagnosis present

## 2023-08-20 DIAGNOSIS — G5701 Lesion of sciatic nerve, right lower limb: Secondary | ICD-10-CM | POA: Diagnosis present

## 2023-08-20 DIAGNOSIS — G9050 Complex regional pain syndrome I, unspecified: Secondary | ICD-10-CM | POA: Insufficient documentation

## 2023-08-20 DIAGNOSIS — M533 Sacrococcygeal disorders, not elsewhere classified: Secondary | ICD-10-CM | POA: Diagnosis present

## 2023-08-20 DIAGNOSIS — S7001XS Contusion of right hip, sequela: Secondary | ICD-10-CM | POA: Diagnosis present

## 2023-08-20 DIAGNOSIS — Q761 Klippel-Feil syndrome: Secondary | ICD-10-CM | POA: Diagnosis present

## 2023-08-20 DIAGNOSIS — M4802 Spinal stenosis, cervical region: Secondary | ICD-10-CM | POA: Insufficient documentation

## 2023-08-20 DIAGNOSIS — G43709 Chronic migraine without aura, not intractable, without status migrainosus: Secondary | ICD-10-CM | POA: Insufficient documentation

## 2023-08-20 DIAGNOSIS — G894 Chronic pain syndrome: Secondary | ICD-10-CM | POA: Insufficient documentation

## 2023-08-20 DIAGNOSIS — T85192S Other mechanical complication of implanted electronic neurostimulator (electrode) of spinal cord, sequela: Secondary | ICD-10-CM | POA: Diagnosis present

## 2023-08-20 MED ORDER — FENTANYL 50 MCG/HR TD PT72: 1.0000 | MEDICATED_PATCH | TRANSDERMAL | 0 refills | Status: DC

## 2023-08-20 MED ORDER — FENTANYL 50 MCG/HR TD PT72: 1.0000 | MEDICATED_PATCH | TRANSDERMAL | 0 refills | Status: AC

## 2023-08-20 MED ORDER — ONABOTULINUMTOXINA 200 UNITS IJ SOLR
155.0000 [IU] | Freq: Once | INTRAMUSCULAR | Status: AC
  Administered 2023-08-20: 200 [IU] via INTRAMUSCULAR
  Filled 2023-08-20: qty 200

## 2023-08-20 MED ORDER — SODIUM CHLORIDE (PF) 0.9 % IJ SOLN
INTRAMUSCULAR | Status: AC
  Filled 2023-08-20: qty 10

## 2023-08-20 NOTE — Progress Notes (Signed)
 PROVIDER NOTE: Information contained herein reflects review and annotations entered in association with encounter. Interpretation of such information and data should be left to medically-trained personnel. Information provided to patient can be located elsewhere in the medical record under "Patient Instructions". Document created using STT-dictation technology, any transcriptional errors that may result from process are unintentional.    Patient: Lindsay Austin  Service Category: E/M  Provider: Cephus Collin, MD  DOB: Feb 16, 1961  DOS: 08/20/2023  Specialty: Interventional Pain Management  MRN: [id number]  Setting: Ambulatory outpatient  PCP: Lindsay Baptise, PA  Type: Established Patient    Referring Provider: Aneita Baptise, PA  Location: Office  Delivery: Face-to-face     HPI  [name], a 63 y.o. year old female, is here today because of her Chronic migraine without aura without status migrainosus, not intractable [G43.709]. [name] primary complain today is Other (Migraines ) and Back Pain (Lumbar bilateral )  Last encounter: My last encounter with her was on 05/26/2023  Pertinent problems: [name] has Hx of lumbosacral spine surgery; History of lumbar fusion (L5-S1 fusion); Failed spinal cord stimulator, sequela; Cervical fusion syndrome (C4-C7); Chronic pain syndrome; Major depressive disorder with current active episode; Lumbar spondylosis; Cervical spondylosis without myelopathy; and Spinal stenosis in cervical region on their pertinent problem list. Pain Assessment: Severity of Chronic pain is reported as a 2 /10. Location: Head (lumbar bilateral) Posterior, Anterior/into both legs, to the bottom of the feet. Onset: More than a month ago. Quality: Discomfort, Other (Comment), Constant (lightning strike in the back and legs). Timing: Constant. Modifying factor(s): rest with pillows for positioning. Vitals:  height is 5\' 4"  (1.626 m) and weight is 173 lb (78.5 kg). Her temporal temperature is  97.5 F (36.4 C) (abnormal). Her blood pressure is 110/78 and her pulse is 61. Her respiration is 16 and oxygen saturation is 95%.   Reason for encounter: medication management.    Patient presents today for medication management.  Of note, patient was hospitalized for approximately 1 week in [place] for pneumonia.  She states that she is recovering.  She endorses fatigue and decreased energy.  She notes that her headache frequency and intensity is getting worse.  Last Botox  treatment was 05/26/2023.  She is also here for Botox .  See attached procedure note.  Pharmacotherapy Assessment  Analgesic:  fentanyl  patch  50 mcg an hour; 120 MME.   Monitoring: [place] PMP: PDMP reviewed during this encounter.       Pharmacotherapy: No side-effects or adverse reactions reported. Compliance: No problems identified. Effectiveness: Clinically acceptable.  UDS:  Summary  Date Value Ref Range Status  05/26/2023 FINAL  Final    Comment:    ==================================================================== [employer] Select 13 (MW) ==================================================================== Test                             Result       Flag       Units  Drug Present and Declared for Prescription Verification   Fentanyl                        28           EXPECTED   ng/mg creat   Norfentanyl                    684          EXPECTED   ng/mg creat    Source  of fentanyl  is a scheduled prescription medication, including    IV, patch, and transmucosal formulations. Norfentanyl is an expected    metabolite of fentanyl .  ==================================================================== Test                      Result    Flag   Units      Ref Range   Creatinine              113              mg/dL      >=16 ==================================================================== Declared Medications:  The flagging and interpretation on this report are based on the  following declared medications.   Unexpected results may arise from  inaccuracies in the declared medications.   **Note: The testing scope of this panel includes these medications:   Fentanyl  (Duragesic )   **Note: The testing scope of this panel does not include the  following reported medications:   Albuterol  (Ventolin  HFA)  Atorvastatin (Lipitor)  Bupropion  (Wellbutrin )  Clopidogrel (Plavix)  Dicyclomine  (Bentyl )  Docusate  Methocarbamol  (Robaxin )  Naloxone  (Narcan )  Ondansetron  (Zofran )  Risperidone (Risperdal)  Rizatriptan  (Maxalt )  Sucralfate  (Carafate )  Trazodone  (Desyrel ) ==================================================================== For clinical consultation, please call [phone]. ====================================================================       ROS  Constitutional: Denies any fever or chills Gastrointestinal: No reported hemesis, hematochezia, vomiting, or acute GI distress Musculoskeletal: Low back pain Neurological:  Migraines  Medication Review  DSS, albuterol , atorvastatin, buPROPion , clopidogrel, dicyclomine , fentaNYL , methocarbamol , naloxone , ondansetron , risperiDONE, rizatriptan , sucralfate , and traZODone   History Review  Allergy: [name] is allergic to methadone, other, and tape. Drug: [name]  reports no history of drug use. Alcohol:  reports no history of alcohol use. Tobacco:  reports that she has never smoked. She has never used smokeless tobacco. Social: [name]  reports that she has never smoked. She has never used smokeless tobacco. She reports that she does not drink alcohol and does not use drugs. Medical:  has a past medical history of Anxiety, Arthritis, Bladder cancer (HCC), Cancer of kidney (HCC), DDD (degenerative disc disease), cervical, Depression, Failed back syndrome, Fibromyalgia, GERD (gastroesophageal reflux disease), Headache, Heart murmur, Hyperlipemia, Hypertension, IBS (irritable bowel syndrome), Kidney failure, Renal insufficiency, RSD  (reflex sympathetic dystrophy), Seizure (HCC), Stomach ulcer, and Trigeminal neuralgia. Surgical: [name]  has a past surgical history that includes Transurethral resection of bladder tumor with gyrus (turbt-gyrus) (2012, 2013); Nephroureterectomy (2011); Back surgery (1995); Cholecystectomy; Brain surgery (2004); trigeminal neuralgia (2004); Spinal cord stimulator implant (2004); colonscopy (2006); Posterior cervical fusion/foraminotomy (Bilateral, 07/05/2019); Posterior cervical laminectomy (Bilateral, 07/05/2019); and Breast biopsy (Left, 2017). Family: family history includes Dementia in her father; Diabetes in her mother; Heart disease in her mother; Kidney disease in her father.  Laboratory Chemistry Profile   Renal Lab Results  Component Value Date   BUN 13 12/29/2020   CREATININE 1.44 (H) 12/29/2020   LABCREA 71 08/29/2019   GFRAA 51 (L) 08/31/2019   GFRNONAA 42 (L) 12/29/2020     Hepatic Lab Results  Component Value Date   AST 32 08/29/2019   ALT 12 08/29/2019   ALBUMIN 4.1 08/29/2019   ALKPHOS 66 08/29/2019     Electrolytes Lab Results  Component Value Date   NA 139 12/29/2020   K 4.3 12/29/2020   CL 101 12/29/2020   CALCIUM  9.2 12/29/2020   MG 2.2 08/31/2019     Bone No results found for: "VD25OH", "VD125OH2TOT", "WJ1914NW2", "NF6213YQ6", "25OHVITD1", "25OHVITD2", "25OHVITD3", "TESTOFREE", "TESTOSTERONE"  Inflammation (CRP: Acute Phase) (ESR: Chronic Phase) No results found for: "CRP", "ESRSEDRATE", "LATICACIDVEN"     Note: Above Lab results reviewed.   Physical Exam  General appearance: Well nourished, well developed, and well hydrated. In no apparent acute distress Mental status: Alert, oriented x 3 (person, place, & time)       Respiratory: No evidence of acute respiratory distress Eyes: PERLA Vitals: BP 110/78 (BP Location: Right Arm, Patient Position: Sitting, Cuff Size: Normal)   Pulse 61   Temp (!) 97.5 F (36.4 C) (Temporal)   Resp 16   Ht 5\' 4"   (1.626 m)   Wt 173 lb (78.5 kg)   SpO2 95%   BMI 29.70 kg/m  BMI: Estimated body mass index is 29.7 kg/m as calculated from the following:   Height as of this encounter: 5\' 4"  (1.626 m).   Weight as of this encounter: 173 lb (78.5 kg). Ideal: Ideal body weight: 54.7 kg (120 lb 9.5 oz) Adjusted ideal body weight: 64.2 kg (141 lb 8.9 oz)  Lumbar Spine Area Exam  Skin & Axial Inspection: Well healed scar from previous spine surgery detected Alignment: Symmetrical Functional ROM: Pain restricted ROM affecting primarily the right Stability: No instability detected Muscle Tone/Strength: Functionally intact. No obvious neuro-muscular anomalies detected. Sensory (Neurological): Musculoskeletal pain pattern    Gait & Posture Assessment  Ambulation: antalgic Gait: Limited. Using assistive device to ambulate Posture: Difficulty standing up straight, due to pain    Lower Extremity Exam      Side: Right lower extremity   Side: Left lower extremity  Stability: No instability observed           Stability: No instability observed          Skin & Extremity Inspection: Skin color, temperature, and hair growth are WNL. No peripheral edema or cyanosis. No masses, redness, swelling, asymmetry, or associated skin lesions. No contractures.   Skin & Extremity Inspection: Skin color, temperature, and hair growth are WNL. No peripheral edema or cyanosis. No masses, redness, swelling, asymmetry, or associated skin lesions. No contractures.  Functional ROM: Pain restricted ROM for all joints of the lower extremity           Functional ROM: Pain restricted ROM for all joints of the lower extremity          Muscle Tone/Strength: Functionally intact. No obvious neuro-muscular anomalies detected.   Muscle Tone/Strength: Functionally intact. No obvious neuro-muscular anomalies detected.  Sensory (Neurological): Unimpaired         Sensory (Neurological): Unimpaired        DTR: Patellar: deferred today Achilles:  deferred today Plantar: deferred today   DTR: Patellar: deferred today Achilles: deferred today Plantar: deferred today  Palpation: No palpable anomalies   Palpation: No palpable anomalies       Assessment   Status Diagnosis  Controlled Controlled Controlled 1. Chronic migraine without aura without status migrainosus, not intractable   2. Failed spinal cord stimulator, sequela   3. History of lumbar fusion   4. Chronic pain syndrome   5. Sacroiliac joint dysfunction of right side   6. Cervical spondylosis without myelopathy   7. Spinal stenosis in cervical region   8. Lumbar spondylosis   9. Cervical fusion syndrome (C4-T2)   10. RSD (reflex sympathetic dystrophy)   11. Lumbar facet arthropathy   12. Cervical facet joint syndrome   13. Piriformis syndrome of right side   14. Contusion of right hip, sequela  Plan of Care  [name] has a current medication list which includes the following long-term medication(s): albuterol , atorvastatin, bupropion , dicyclomine , risperidone, rizatriptan , sucralfate , and trazodone .  1. Chronic migraine without aura without status migrainosus, not intractable (Primary) - botulinum toxin Type A  (BOTOX ) injection 155 Units - Chemo Denervation; Future  2. Failed spinal cord stimulator, sequela - fentaNYL  (DURAGESIC ) 50 MCG/HR; Place 1 patch onto the skin every 3 (three) days. Most last 30 days.  Dispense: 10 patch; Refill: 0 - fentaNYL  (DURAGESIC ) 50 MCG/HR; Place 1 patch onto the skin every 3 (three) days. Most last 30 days.  Dispense: 10 patch; Refill: 0 - fentaNYL  (DURAGESIC ) 50 MCG/HR; Place 1 patch onto the skin every 3 (three) days. Most last 30 days.  Dispense: 10 patch; Refill: 0  3. History of lumbar fusion - fentaNYL  (DURAGESIC ) 50 MCG/HR; Place 1 patch onto the skin every 3 (three) days. Most last 30 days.  Dispense: 10 patch; Refill: 0 - fentaNYL  (DURAGESIC ) 50 MCG/HR; Place 1 patch onto the skin every 3 (three) days.  Most last 30 days.  Dispense: 10 patch; Refill: 0 - fentaNYL  (DURAGESIC ) 50 MCG/HR; Place 1 patch onto the skin every 3 (three) days. Most last 30 days.  Dispense: 10 patch; Refill: 0  4. Chronic pain syndrome - fentaNYL  (DURAGESIC ) 50 MCG/HR; Place 1 patch onto the skin every 3 (three) days. Most last 30 days.  Dispense: 10 patch; Refill: 0 - fentaNYL  (DURAGESIC ) 50 MCG/HR; Place 1 patch onto the skin every 3 (three) days. Most last 30 days.  Dispense: 10 patch; Refill: 0 - fentaNYL  (DURAGESIC ) 50 MCG/HR; Place 1 patch onto the skin every 3 (three) days. Most last 30 days.  Dispense: 10 patch; Refill: 0  5. Sacroiliac joint dysfunction of right side - fentaNYL  (DURAGESIC ) 50 MCG/HR; Place 1 patch onto the skin every 3 (three) days. Most last 30 days.  Dispense: 10 patch; Refill: 0 - fentaNYL  (DURAGESIC ) 50 MCG/HR; Place 1 patch onto the skin every 3 (three) days. Most last 30 days.  Dispense: 10 patch; Refill: 0 - fentaNYL  (DURAGESIC ) 50 MCG/HR; Place 1 patch onto the skin every 3 (three) days. Most last 30 days.  Dispense: 10 patch; Refill: 0  6. Cervical spondylosis without myelopathy - fentaNYL  (DURAGESIC ) 50 MCG/HR; Place 1 patch onto the skin every 3 (three) days. Most last 30 days.  Dispense: 10 patch; Refill: 0 - fentaNYL  (DURAGESIC ) 50 MCG/HR; Place 1 patch onto the skin every 3 (three) days. Most last 30 days.  Dispense: 10 patch; Refill: 0 - fentaNYL  (DURAGESIC ) 50 MCG/HR; Place 1 patch onto the skin every 3 (three) days. Most last 30 days.  Dispense: 10 patch; Refill: 0  7. Spinal stenosis in cervical region - fentaNYL  (DURAGESIC ) 50 MCG/HR; Place 1 patch onto the skin every 3 (three) days. Most last 30 days.  Dispense: 10 patch; Refill: 0 - fentaNYL  (DURAGESIC ) 50 MCG/HR; Place 1 patch onto the skin every 3 (three) days. Most last 30 days.  Dispense: 10 patch; Refill: 0 - fentaNYL  (DURAGESIC ) 50 MCG/HR; Place 1 patch onto the skin every 3 (three) days. Most last 30 days.  Dispense:  10 patch; Refill: 0  8. Lumbar spondylosis - fentaNYL  (DURAGESIC ) 50 MCG/HR; Place 1 patch onto the skin every 3 (three) days. Most last 30 days.  Dispense: 10 patch; Refill: 0 - fentaNYL  (DURAGESIC ) 50 MCG/HR; Place 1 patch onto the skin every 3 (three) days. Most last 30 days.  Dispense: 10 patch; Refill: 0 - fentaNYL  (DURAGESIC )  50 MCG/HR; Place 1 patch onto the skin every 3 (three) days. Most last 30 days.  Dispense: 10 patch; Refill: 0  9. Cervical fusion syndrome (C4-T2) - fentaNYL  (DURAGESIC ) 50 MCG/HR; Place 1 patch onto the skin every 3 (three) days. Most last 30 days.  Dispense: 10 patch; Refill: 0 - fentaNYL  (DURAGESIC ) 50 MCG/HR; Place 1 patch onto the skin every 3 (three) days. Most last 30 days.  Dispense: 10 patch; Refill: 0 - fentaNYL  (DURAGESIC ) 50 MCG/HR; Place 1 patch onto the skin every 3 (three) days. Most last 30 days.  Dispense: 10 patch; Refill: 0  10. RSD (reflex sympathetic dystrophy) - fentaNYL  (DURAGESIC ) 50 MCG/HR; Place 1 patch onto the skin every 3 (three) days. Most last 30 days.  Dispense: 10 patch; Refill: 0 - fentaNYL  (DURAGESIC ) 50 MCG/HR; Place 1 patch onto the skin every 3 (three) days. Most last 30 days.  Dispense: 10 patch; Refill: 0 - fentaNYL  (DURAGESIC ) 50 MCG/HR; Place 1 patch onto the skin every 3 (three) days. Most last 30 days.  Dispense: 10 patch; Refill: 0  11. Lumbar facet arthropathy - fentaNYL  (DURAGESIC ) 50 MCG/HR; Place 1 patch onto the skin every 3 (three) days. Most last 30 days.  Dispense: 10 patch; Refill: 0 - fentaNYL  (DURAGESIC ) 50 MCG/HR; Place 1 patch onto the skin every 3 (three) days. Most last 30 days.  Dispense: 10 patch; Refill: 0 - fentaNYL  (DURAGESIC ) 50 MCG/HR; Place 1 patch onto the skin every 3 (three) days. Most last 30 days.  Dispense: 10 patch; Refill: 0  12. Cervical facet joint syndrome - fentaNYL  (DURAGESIC ) 50 MCG/HR; Place 1 patch onto the skin every 3 (three) days. Most last 30 days.  Dispense: 10 patch; Refill:  0 - fentaNYL  (DURAGESIC ) 50 MCG/HR; Place 1 patch onto the skin every 3 (three) days. Most last 30 days.  Dispense: 10 patch; Refill: 0 - fentaNYL  (DURAGESIC ) 50 MCG/HR; Place 1 patch onto the skin every 3 (three) days. Most last 30 days.  Dispense: 10 patch; Refill: 0  13. Piriformis syndrome of right side - fentaNYL  (DURAGESIC ) 50 MCG/HR; Place 1 patch onto the skin every 3 (three) days. Most last 30 days.  Dispense: 10 patch; Refill: 0 - fentaNYL  (DURAGESIC ) 50 MCG/HR; Place 1 patch onto the skin every 3 (three) days. Most last 30 days.  Dispense: 10 patch; Refill: 0 - fentaNYL  (DURAGESIC ) 50 MCG/HR; Place 1 patch onto the skin every 3 (three) days. Most last 30 days.  Dispense: 10 patch; Refill: 0  14. Contusion of right hip, sequela - fentaNYL  (DURAGESIC ) 50 MCG/HR; Place 1 patch onto the skin every 3 (three) days. Most last 30 days.  Dispense: 10 patch; Refill: 0 - fentaNYL  (DURAGESIC ) 50 MCG/HR; Place 1 patch onto the skin every 3 (three) days. Most last 30 days.  Dispense: 10 patch; Refill: 0 - fentaNYL  (DURAGESIC ) 50 MCG/HR; Place 1 patch onto the skin every 3 (three) days. Most last 30 days.  Dispense: 10 patch; Refill: 0      Pharmacotherapy (Medications Ordered): Meds ordered this encounter  Medications   botulinum toxin Type A  (BOTOX ) injection 155 Units   fentaNYL  (DURAGESIC ) 50 MCG/HR    Sig: Place 1 patch onto the skin every 3 (three) days. Most last 30 days.    Dispense:  10 patch    Refill:  0     STOP ACT - Not applicable. Fill one day early if pharmacy is closed on scheduled refill date.   fentaNYL  (DURAGESIC ) 50 MCG/HR  Sig: Place 1 patch onto the skin every 3 (three) days. Most last 30 days.    Dispense:  10 patch    Refill:  0    [place] STOP ACT - Not applicable. Fill one day early if pharmacy is closed on scheduled refill date.   fentaNYL  (DURAGESIC ) 50 MCG/HR    Sig: Place 1 patch onto the skin every 3 (three) days. Most last 30 days.    Dispense:  10 patch     Refill:  0    [place] STOP ACT - Not applicable. Fill one day early if pharmacy is closed on scheduled refill date.   Follow-up for Botox  for migraine management  Orders Placed This Encounter  Procedures   Chemo Denervation    Standing Status:   Future    Expected Date:   11/19/2023    Expiration Date:   08/19/2024    Scheduling Instructions:     Botox  for migraine management to be done at [facility]    Where should this test be performed?:   other     Follow-up plan:   Return in about 3 months (around 11/19/2023) for MM + Botox .   Recent Visits Date Type Provider Dept  05/26/23 Procedure visit Cephus Collin, MD [facility]  Showing recent visits within past 90 days and meeting all other requirements Today's Visits Date Type Provider Dept  08/20/23 Procedure visit Cephus Collin, MD [facility]  Showing today's visits and meeting all other requirements Future Appointments No visits were found meeting these conditions. Showing future appointments within next 90 days and meeting all other requirements  I discussed the assessment and treatment plan with the patient. The patient was provided an opportunity to ask questions and all were answered. The patient agreed with the plan and demonstrated an understanding of the instructions.  Patient advised to call back or seek an in-person evaluation if the symptoms or condition worsens.  Duration of encounter: 35 minutes.  Note by: Cephus Collin, MD Date: 08/20/2023; Time: 1:06 PM

## 2023-08-20 NOTE — Progress Notes (Signed)
 PROVIDER NOTE: Information contained herein reflects review and annotations entered in association with encounter. Interpretation of such information and data should be left to medically-trained personnel. Information provided to patient can be located elsewhere in the medical record under "Patient Instructions". Document created using STT-dictation technology, any transcriptional errors that may result from process are unintentional.    Patient: Lindsay Austin  Service Category: Procedure  Provider: Cephus Collin, MD  DOB: [birth year]  DOS: 08/20/2023  Location: [facility]  MRN: [id number]  Setting: Ambulatory - outpatient  Referring Provider: Aneita Baptise, [place]  Type: Established Patient  Specialty: Interventional Pain Management  PCP: Aneita Baptise, PA   Primary Reason for Visit: Interventional Pain Management Treatment. CC: migraine     Procedure:            Botox  for migraine management #8        1. Chronic migraine without aura without status migrainosus, not intractable   2. Failed spinal cord stimulator, sequela   3. History of lumbar fusion   4. Chronic pain syndrome   5. Sacroiliac joint dysfunction of right side   6. Cervical spondylosis without myelopathy   7. Spinal stenosis in cervical region   8. Lumbar spondylosis   9. Cervical fusion syndrome (C4-T2)   10. RSD (reflex sympathetic dystrophy)   11. Lumbar facet arthropathy   12. Cervical facet joint syndrome   13. Piriformis syndrome of right side   14. Contusion of right hip, sequela     Pain Score: Pre-procedure: 2 /10 Post-procedure: 2 /10     Pre-op H&P Assessment:  Lindsay Austin is a 63 y.o. (year old), female patient, seen today for interventional treatment. 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 Lindsay Austin has a current medication list which includes the following prescription(s): albuterol , atorvastatin, bupropion , clopidogrel, dicyclomine , dss, methocarbamol , naloxone , ondansetron , risperidone, rizatriptan , sucralfate , trazodone , [START ON 09/03/2023] fentanyl , [START ON 10/03/2023] fentanyl , and [START ON 11/02/2023] fentanyl . Her primarily concern today is the Other (Migraines ) and Back Pain (Lumbar bilateral )   Initial Vital Signs:  Pulse/HCG Rate: 61  Temp: (!) 97.5 F (36.4 C) Resp: 16 BP: 110/78 SpO2: 95 %  BMI: Estimated body mass index is 29.7 kg/m as calculated from the following:   Height as of this encounter: 5\' 4"  (1.626 m).   Weight as of this encounter: 173 lb (78.5 kg).  Risk Assessment: Allergies: Reviewed. She is allergic to methadone, other, and tape.  Allergy Precautions: None required Coagulopathies: Reviewed. None identified.  Blood-thinner therapy: None at this time Active Infection(s): Reviewed. None identified. Lindsay Austin is afebrile  Site Confirmation: Lindsay Austin was asked to confirm the procedure and laterality before marking the site Procedure checklist: Completed Consent: Before the procedure and under the influence of no sedative(s), amnesic(s), or anxiolytics, the patient was informed of the treatment options, risks and possible complications. To fulfill our ethical and legal obligations, as recommended by the American Medical Association's Code of Ethics, I have informed the patient of my clinical impression; the nature and purpose of the treatment or procedure; the risks, benefits, and possible complications of the intervention; the alternatives, including doing nothing; the risk(s) and benefit(s) of the alternative treatment(s) or procedure(s); and the  risk(s) and benefit(s) of doing nothing. The patient was provided information about the general risks and possible complications associated with the procedure. These may include, but are not limited to: failure to achieve desired goals, infection, bleeding, organ or nerve damage, allergic reactions, paralysis, and death. In addition, the patient was informed of those risks and complications associated to the procedure, such as failure to decrease pain; infection; bleeding; organ or nerve damage with subsequent damage to sensory, motor, and/or autonomic systems, resulting in permanent pain, numbness, and/or weakness of one or several areas of the body; allergic reactions; (i.e.: anaphylactic reaction); and/or death. Furthermore, the patient was informed of those risks and complications associated with the medications. These include, but are not limited to: allergic reactions (i.e.: anaphylactic or anaphylactoid reaction(s)); adrenal axis suppression; blood sugar elevation that in diabetics may result in ketoacidosis or comma; water retention that in patients with history of congestive heart failure may result in shortness of breath, pulmonary edema, and decompensation with resultant heart failure; weight gain; swelling or edema; medication-induced neural toxicity; particulate matter embolism and blood vessel occlusion with resultant organ, and/or nervous system infarction; and/or aseptic necrosis of one or more joints. Finally, the patient was informed that Medicine is not an exact science; therefore, there is also the possibility of unforeseen or unpredictable risks and/or possible complications that may result in a catastrophic outcome. The patient indicated having understood very clearly. We have given the patient no guarantees and we have made no promises. Enough time was given to the patient to ask questions, all of which were answered to the patient's satisfaction. Lindsay Austin has indicated that she wanted to  continue with the procedure. Attestation: I, the ordering provider, attest that I have discussed with the patient the benefits, risks, side-effects, alternatives, likelihood of achieving goals, and potential problems during recovery for the procedure that I have provided informed consent. Date  Time: 08/20/2023 11:01 AM  Pre-Procedure Preparation:  Monitoring: As per clinic protocol. Respiration, ETCO2, SpO2, BP, heart rate and rhythm monitor placed and checked for adequate function Safety Precautions: Patient was assessed for positional comfort and pressure points before starting the procedure. Time-out: I initiated and conducted the "Time-out" before starting the procedure, as per protocol. The patient was asked to participate by confirming the accuracy of the "Time Out" information. Verification of the correct person, site, and procedure were performed and confirmed by me, the nursing staff, and the patient. "Time-out" conducted as per Joint Commission's Universal Protocol (UP.01.01.01). Time: 1143  Constitution and injection details as below:   1 x 200 unit vial of Botox  brand onabotulinum toxin A was diluted with 4 mL preservative free saline, for a final concentration of 5 units/0.1 mL. Injection sites were prepared for injection with alcohol swab  Botox  was injected as follows:  5 units left corrugator 5 units right corrugator 5 units procerus  5 units in 2 sites, right frontalis (10 units total) 5units in 2 sites, left frontalis (10 units total) 5 units in each of 4 sites right temporalis (20 units total) 5 units in each of 4 sites left temporalis (20 units total) 5 units in each of 3 sites right occipitalis (15 units total) 5 units in each of 3 sites left occipitalis (15 units total) 5 units in each of 2 sites right cervical paraspinal at approximate C3-4 level (10 units total) 5 units in each of 2 sites left cervical paraspinal muscles at approximate C3-4 level (10 units  total) 5 units in each of  3 sites right trapezius (15 units total) 5 units in each of 3 sites left trapezius (15 units total)  45 units were wasted  A total of 155 units was administered by Dr Rhesa Celeste        Post-operative Assessment:  Post-procedure Vital Signs:  Pulse/HCG Rate: 61  Temp: (!) 97.5 F (36.4 C) Resp: 16 BP: 110/78 SpO2: 95 %  EBL: None  Complications: No immediate post-treatment complications observed by team, or reported by patient.  Note: The patient tolerated the entire procedure well. A repeat set of vitals were taken after the procedure and the patient was kept under observation following institutional policy, for this type of procedure. Post-procedural neurological assessment was performed, showing return to baseline, prior to discharge. The patient was provided with post-procedure discharge instructions, including a section on how to identify potential problems. Should any problems arise concerning this procedure, the patient was given instructions to immediately contact us , at any time, without hesitation. In any case, we plan to contact the patient by telephone for a follow-up status report regarding this interventional procedure.  Comments:  No additional relevant information.  Plan of Care   Chronic Opioid Analgesic:   fentanyl  patch  50 mcg an hour; 120 MME.   Medications ordered for procedure: Meds ordered this encounter  Medications   botulinum toxin Type A  (BOTOX ) injection 155 Units   fentaNYL  (DURAGESIC ) 50 MCG/HR    Sig: Place 1 patch onto the skin every 3 (three) days. Most last 30 days.    Dispense:  10 patch    Refill:  0    [place] STOP ACT - Not applicable. Fill one day early if pharmacy is closed on scheduled refill date.   fentaNYL  (DURAGESIC ) 50 MCG/HR    Sig: Place 1 patch onto the skin every 3 (three) days. Most last 30 days.    Dispense:  10 patch    Refill:  0    [place] STOP ACT - Not applicable. Fill one day early if pharmacy is  closed on scheduled refill date.   fentaNYL  (DURAGESIC ) 50 MCG/HR    Sig: Place 1 patch onto the skin every 3 (three) days. Most last 30 days.    Dispense:  10 patch    Refill:  0     STOP ACT - Not applicable. Fill one day early if pharmacy is closed on scheduled refill date.   Medications administered: We administered botulinum toxin Type A .  See the medical record for exact dosing, route, and time of administration.  Follow-up plan:   Return in about 3 months (around 11/19/2023) for MM + Botox .     Recent Visits Date Type Provider Dept  05/26/23 Procedure visit Cephus Collin, MD [facility]  Showing recent visits within past 90 days and meeting all other requirements Today's Visits Date Type Provider Dept  08/20/23 Procedure visit Cephus Collin, MD [facility]  Showing today's visits and meeting all other requirements Future Appointments No visits were found meeting these conditions. Showing future appointments within next 90 days and meeting all other requirements  Disposition: Discharge home  Discharge (Date  Time): 08/20/2023; 1150 hrs.   Primary Care Physician: Aneita Baptise, [place] Location: [facility] Note by: Cephus Collin, MD Date: 08/20/2023; Time: 1:08 PM  Disclaimer:  Medicine is not an exact science. The only guarantee in medicine is that nothing is guaranteed. It is important to note that the decision to proceed with this intervention was based on the  information collected from the patient. The Data and conclusions were drawn from the patient's questionnaire, the interview, and the physical examination. Because the information was provided in large part by the patient, it cannot be guaranteed that it has not been purposely or unconsciously manipulated. Every effort has been made to obtain as much relevant data as possible for this evaluation. It is important to note that the conclusions that lead to this procedure are  derived in large part from the available data. Always take into account that the treatment will also be dependent on availability of resources and existing treatment guidelines, considered by other Pain Management Practitioners as being common knowledge and practice, at the time of the intervention. For Medico-Legal purposes, it is also important to point out that variation in procedural techniques and pharmacological choices are the acceptable norm. The indications, contraindications, technique, and results of the above procedure should only be interpreted and judged by a Board-Certified Interventional Pain Specialist with extensive familiarity and expertise in the same exact procedure and technique.

## 2023-08-20 NOTE — Progress Notes (Signed)
 Nursing Pain Medication Assessment:  Safety precautions to be maintained throughout the outpatient stay will include: orient to surroundings, keep bed in low position, maintain call bell within reach at all times, provide assistance with transfer out of bed and ambulation.  Medication Inspection Compliance: Pill count conducted under aseptic conditions, in front of the patient. Neither the pills nor the bottle was removed from the patient's sight at any time. Once count was completed pills were immediately returned to the patient in their original bottle.  Medication: Fentanyl  patch Pill/Patch Count:  4 of 10 patches remain Pill/Patch Appearance: Markings consistent with prescribed medication Bottle Appearance: Standard pharmacy container. Clearly labeled. Filled Date: 04 / 08 / 2025 Last Medication intake:  Today

## 2023-08-21 ENCOUNTER — Telehealth: Payer: Self-pay

## 2023-08-21 ENCOUNTER — Encounter: Payer: Medicaid Other | Admitting: Student in an Organized Health Care Education/Training Program

## 2023-08-21 NOTE — Telephone Encounter (Signed)
 Post procedure follow up.  Did not leave message due to answering machine being of unknow person.

## 2023-11-11 ENCOUNTER — Telehealth: Payer: Self-pay

## 2023-11-11 ENCOUNTER — Telehealth: Payer: Self-pay | Admitting: Student in an Organized Health Care Education/Training Program

## 2023-11-11 NOTE — Telephone Encounter (Signed)
 PA completed via CMM for Fentanyl  50mcg/hr. KEY: [id number]

## 2023-11-11 NOTE — Telephone Encounter (Signed)
 Patient states she needs PA for Fentanyl 

## 2023-11-11 NOTE — Telephone Encounter (Signed)
 Lindsay Austin (Key: [id number]) PA Case ID #: [id number] Approved today by [employer] MCD 2017 Approved. Approved for FENTANYL  Patch 72HR 50MCG/HR, quantity up to 10 per 30 days, under the pharmacy benefit. The drug has been approved from 11/11/2023 to 02/09/2024. Generic or biosimilar substitution may be required when available and preferred on the formulary. Please note that dispensing of non-maintenance and specialty medications may be limited to a monthly supply. Effective Date: 11/11/2023 Authorization Expiration Date: 02/09/2024 Drug fentaNYL  50MCG/HR 72 hr patches  [employer] [employer] Electronic Prior Authorization Request Form

## 2023-11-11 NOTE — Telephone Encounter (Signed)
Fentanyl approved

## 2023-11-12 ENCOUNTER — Other Ambulatory Visit: Payer: Self-pay

## 2023-11-12 DIAGNOSIS — Z981 Arthrodesis status: Secondary | ICD-10-CM

## 2023-11-12 DIAGNOSIS — Q761 Klippel-Feil syndrome: Secondary | ICD-10-CM

## 2023-11-12 DIAGNOSIS — M47812 Spondylosis without myelopathy or radiculopathy, cervical region: Secondary | ICD-10-CM

## 2023-11-12 DIAGNOSIS — M533 Sacrococcygeal disorders, not elsewhere classified: Secondary | ICD-10-CM

## 2023-11-12 DIAGNOSIS — T85192S Other mechanical complication of implanted electronic neurostimulator (electrode) of spinal cord, sequela: Secondary | ICD-10-CM

## 2023-11-12 DIAGNOSIS — G5701 Lesion of sciatic nerve, right lower limb: Secondary | ICD-10-CM

## 2023-11-12 DIAGNOSIS — M4802 Spinal stenosis, cervical region: Secondary | ICD-10-CM

## 2023-11-12 DIAGNOSIS — G9050 Complex regional pain syndrome I, unspecified: Secondary | ICD-10-CM

## 2023-11-12 DIAGNOSIS — S7001XS Contusion of right hip, sequela: Secondary | ICD-10-CM

## 2023-11-12 DIAGNOSIS — G894 Chronic pain syndrome: Secondary | ICD-10-CM

## 2023-11-12 DIAGNOSIS — M47816 Spondylosis without myelopathy or radiculopathy, lumbar region: Secondary | ICD-10-CM

## 2023-11-12 MED ORDER — FENTANYL 50 MCG/HR TD PT72: 1.0000 | MEDICATED_PATCH | TRANSDERMAL | 0 refills | Status: DC

## 2023-11-12 NOTE — Telephone Encounter (Signed)
 Patient called her insurance has denied filling until PA is completed

## 2023-11-12 NOTE — Telephone Encounter (Signed)
 Spoke with [facility] and they state that the issue is not a PA, it is that they have initiated a new policy which is that they need 3 items put on the prescription.  1.  The rationale for the medication 2.  The tried and failed medications 3. The distance from the clinic to the pharmacy.    We called two other [facility] pharmacies and spoke with two different pharmacists who agree that this is a new policy. One states that it can be at the discretion of the pharmacist as to how strict the policy is followed.  He states that each pharmacist my require different things.  He states that some pharmacists will look at the PMP to find out what tried and failed drugs have been used, some will call the office and some will requuire that it be put on the prescription.  He says this is their right to do this as it is their liscence on the line.

## 2023-11-19 ENCOUNTER — Telehealth: Payer: Self-pay

## 2023-11-19 ENCOUNTER — Other Ambulatory Visit: Payer: Self-pay | Admitting: Nurse Practitioner

## 2023-11-19 ENCOUNTER — Encounter: Payer: Self-pay | Admitting: Student in an Organized Health Care Education/Training Program

## 2023-11-19 ENCOUNTER — Ambulatory Visit
Attending: Student in an Organized Health Care Education/Training Program | Admitting: Student in an Organized Health Care Education/Training Program

## 2023-11-19 VITALS — BP 115/77 | HR 61 | Temp 98.2°F | Ht 64.0 in | Wt 173.0 lb

## 2023-11-19 DIAGNOSIS — G43709 Chronic migraine without aura, not intractable, without status migrainosus: Secondary | ICD-10-CM | POA: Insufficient documentation

## 2023-11-19 DIAGNOSIS — T85192S Other mechanical complication of implanted electronic neurostimulator (electrode) of spinal cord, sequela: Secondary | ICD-10-CM | POA: Insufficient documentation

## 2023-11-19 DIAGNOSIS — G894 Chronic pain syndrome: Secondary | ICD-10-CM | POA: Diagnosis present

## 2023-11-19 DIAGNOSIS — Z981 Arthrodesis status: Secondary | ICD-10-CM | POA: Insufficient documentation

## 2023-11-19 DIAGNOSIS — M533 Sacrococcygeal disorders, not elsewhere classified: Secondary | ICD-10-CM | POA: Diagnosis present

## 2023-11-19 MED ORDER — FENTANYL 12 MCG/HR TD PT72: MEDICATED_PATCH | TRANSDERMAL | 0 refills | Status: DC

## 2023-11-19 MED ORDER — ONABOTULINUMTOXINA 200 UNITS IJ SOLR
155.0000 [IU] | Freq: Once | INTRAMUSCULAR | Status: AC
  Administered 2023-11-19: 155 [IU] via INTRAMUSCULAR
  Filled 2023-11-19 (×2): qty 200

## 2023-11-19 MED ORDER — FENTANYL 25 MCG/HR TD PT72: MEDICATED_PATCH | TRANSDERMAL | 0 refills | Status: DC

## 2023-11-19 MED ORDER — SODIUM CHLORIDE (PF) 0.9 % IJ SOLN
INTRAMUSCULAR | Status: AC
  Filled 2023-11-19: qty 10

## 2023-11-19 NOTE — Telephone Encounter (Signed)
 Patient records from [facility] have been sent and faxed to 2 locations per patient request:   [facility] located in [address] [place], [place] [address]. Fax number [phone] [facility] Fax # [phone]  [place] plans to move to [place] and the pain clinics there need further information to determine if they can prescribe her Fentanyl  patches for her transfer of care.

## 2023-11-19 NOTE — Progress Notes (Signed)
 PROVIDER NOTE: Information contained herein reflects review and annotations entered in association with encounter. Interpretation of such information and data should be left to medically-trained personnel. Information provided to patient can be located elsewhere in the medical record under Patient Instructions. Document created using STT-dictation technology, any transcriptional errors that may result from process are unintentional.    Patient: Lindsay Austin  Service Category: Procedure  Provider: Wallie Sherry, MD  DOB: December 13, 1960  DOS: 11/19/2023  Location: [facility]  MRN: [id number]  Setting: Ambulatory - outpatient  Referring Provider: Donnie Handing, PA  Type: Established Patient  Specialty: Interventional Pain Management  PCP: Donnie Handing, PA   Primary Reason for Visit: Interventional Pain Management Treatment. CC: migraine     Procedure:            Botox  for migraine management #9      1. Chronic migraine without aura without status migrainosus, not intractable   2. Failed spinal cord stimulator, sequela   3. History of lumbar fusion   4. Sacroiliac joint dysfunction of right side   5. Chronic pain syndrome     Pain Score: Pre-procedure: 3 /10 Post-procedure: 3 /10     Pre-op H&P Assessment:  Lindsay Austin is a 63 y.o. (year old), female patient, seen today for interventional treatment. 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 Lindsay Austin has a current medication list which includes the following prescription(s): albuterol , atorvastatin, bupropion , clopidogrel,  dicyclomine , dss, [START ON 12/01/2023] fentanyl , fentanyl , methocarbamol , naloxone , ondansetron , risperidone, rizatriptan , sucralfate , and trazodone . Her primarily concern today is the Back Pain and Hip Pain (low)   Initial Vital Signs:  Pulse/HCG Rate: 61  Temp: 98.2 F (36.8 C) Resp:   BP: 115/77 SpO2: 97 %  BMI: Estimated body mass index is 29.7 kg/m as calculated from the following:   Height as of this encounter: 5' 4 (1.626 m).   Weight as of this encounter: 173 lb (78.5 kg).  Risk Assessment: Allergies: Reviewed. She is allergic to methadone, other, and tape.  Allergy Precautions: None required Coagulopathies: Reviewed. None identified.  Blood-thinner therapy: None at this time Active Infection(s): Reviewed. None identified. Lindsay Austin is afebrile  Site Confirmation: Lindsay Austin was asked to confirm the procedure and laterality before marking the site Procedure checklist: Completed Consent: Before the procedure and under the influence of no sedative(s), amnesic(s), or anxiolytics, the patient was informed of the treatment options, risks and possible complications. To fulfill our ethical and legal obligations, as recommended by the American Medical Association's Code of Ethics, I have informed the patient of my clinical impression; the nature and purpose of the treatment or procedure; the risks, benefits, and possible complications of the intervention; the alternatives, including doing nothing; the risk(s) and benefit(s) of the alternative treatment(s) or procedure(s); and the risk(s) and benefit(s) of doing nothing. The patient was provided information about the general risks and possible complications associated with the procedure. These may include, but are not limited to: failure to achieve desired goals, infection, bleeding, organ or nerve damage, allergic reactions, paralysis, and death. In addition, the patient was informed of those risks and complications associated to the  procedure, such as failure to decrease pain; infection; bleeding; organ or nerve damage with  subsequent damage to sensory, motor, and/or autonomic systems, resulting in permanent pain, numbness, and/or weakness of one or several areas of the body; allergic reactions; (i.e.: anaphylactic reaction); and/or death. Furthermore, the patient was informed of those risks and complications associated with the medications. These include, but are not limited to: allergic reactions (i.e.: anaphylactic or anaphylactoid reaction(s)); adrenal axis suppression; blood sugar elevation that in diabetics may result in ketoacidosis or comma; water retention that in patients with history of congestive heart failure may result in shortness of breath, pulmonary edema, and decompensation with resultant heart failure; weight gain; swelling or edema; medication-induced neural toxicity; particulate matter embolism and blood vessel occlusion with resultant organ, and/or nervous system infarction; and/or aseptic necrosis of one or more joints. Finally, the patient was informed that Medicine is not an exact science; therefore, there is also the possibility of unforeseen or unpredictable risks and/or possible complications that may result in a catastrophic outcome. The patient indicated having understood very clearly. We have given the patient no guarantees and we have made no promises. Enough time was given to the patient to ask questions, all of which were answered to the patient's satisfaction. Lindsay Austin has indicated that she wanted to continue with the procedure. Attestation: I, the ordering provider, attest that I have discussed with the patient the benefits, risks, side-effects, alternatives, likelihood of achieving goals, and potential problems during recovery for the procedure that I have provided informed consent. Date  Time: 11/19/2023 11:16 AM  Pre-Procedure Preparation:  Monitoring: As per clinic protocol. Respiration, ETCO2,  SpO2, BP, heart rate and rhythm monitor placed and checked for adequate function Safety Precautions: Patient was assessed for positional comfort and pressure points before starting the procedure. Time-out: I initiated and conducted the Time-out before starting the procedure, as per protocol. The patient was asked to participate by confirming the accuracy of the Time Out information. Verification of the correct person, site, and procedure were performed and confirmed by me, the nursing staff, and the patient. Time-out conducted as per Joint Commission's Universal Protocol (UP.01.01.01). Time: 1227  Constitution and injection details as below:   1 x 200 unit vial of Botox  brand onabotulinum toxin A was diluted with 4 mL preservative free saline, for a final concentration of 5 units/0.1 mL. Injection sites were prepared for injection with alcohol swab  Botox  was injected as follows:  5 units left corrugator 5 units right corrugator 5 units procerus  5 units in 2 sites, right frontalis (10 units total) 5units in 2 sites, left frontalis (10 units total) 5 units in each of 4 sites right temporalis (20 units total) 5 units in each of 4 sites left temporalis (20 units total) 5 units in each of 3 sites right occipitalis (15 units total) 5 units in each of 3 sites left occipitalis (15 units total) 5 units in each of 2 sites right cervical paraspinal at approximate C3-4 level (10 units total) 5 units in each of 2 sites left cervical paraspinal muscles at approximate C3-4 level (10 units total) 5 units in each of 3 sites right trapezius (15 units total) 5 units in each of 3 sites left trapezius (15 units total)  45 units were wasted  A total of 155 units was administered by Dr Marcelino        Post-operative Assessment:  Post-procedure Vital Signs:  Pulse/HCG Rate: 61  Temp: 98.2 F (36.8 C) Resp:   BP: 115/77 SpO2: 97 %  EBL: None  Complications: No immediate post-treatment  complications observed by team, or reported by patient.  Note: The patient tolerated the entire procedure well. A repeat set of vitals were taken after the procedure and the patient was kept under observation following institutional policy, for this type of procedure. Post-procedural neurological assessment was performed, showing return to baseline, prior to discharge. The patient was provided with post-procedure discharge instructions, including a section on how to identify potential problems. Should any problems arise concerning this procedure, the patient was given instructions to immediately contact us , at any time, without hesitation. In any case, we plan to contact the patient by telephone for a follow-up status report regarding this interventional procedure.  Comments:  No additional relevant information.  Plan of Care   Chronic Opioid Analgesic:   fentanyl  patch  50 mcg an hour; 120 MME.   Medications ordered for procedure: Meds ordered this encounter  Medications   botulinum toxin Type A  (BOTOX ) injection 155 Units   fentaNYL  (DURAGESIC ) 12 MCG/HR    Sig: To be taken with a 25 mcg/hr patch for a total of 37 mcg/hr. Must last 30 days.    Dispense:  10 patch    Refill:  0    Chronic Pain: STOP Act (Not applicable) Fill 1 day early if closed on refill date. Avoid benzodiazepines within 8 hours of opioids   fentaNYL  (DURAGESIC ) 25 MCG/HR    Sig: To be taken with a 12mcg/hr patch for a total of 37 mcg/hr. Must last 30 days.    Dispense:  10 patch    Refill:  0    Chronic Pain: STOP Act (Not applicable) Fill 1 day early if closed on refill date. Avoid benzodiazepines within 8 hours of opioids   Medications administered: We administered botulinum toxin Type A .  See the medical record for exact dosing, route, and time of administration.  Follow-up plan:   Return for patient will call to schedule F2F appt prn.     Recent Visits No visits were found meeting these conditions. Showing  recent visits within past 90 days and meeting all other requirements Today's Visits Date Type Provider Dept  11/19/23 Procedure visit Marcelino Nurse, MD [facility]  Showing today's visits and meeting all other requirements Future Appointments No visits were found meeting these conditions. Showing future appointments within next 90 days and meeting all other requirements  Disposition: Discharge home  Discharge (Date  Time): 11/19/2023; 1233 hrs.   Primary Care Physician: Donnie Handing, [place] Location: [facility] Note by: Nurse Marcelino, MD Date: 11/19/2023; Time: 1:12 PM  Disclaimer:  Medicine is not an exact science. The only guarantee in medicine is that nothing is guaranteed. It is important to note that the decision to proceed with this intervention was based on the information collected from the patient. The Data and conclusions were drawn from the patient's questionnaire, the interview, and the physical examination. Because the information was provided in large part by the patient, it cannot be guaranteed that it has not been purposely or unconsciously manipulated. Every effort has been made to obtain as much relevant data as possible for this evaluation. It is important to note that the conclusions that lead to this procedure are derived in large part from the available data. Always take into account that the treatment will also be dependent on availability of resources and existing treatment guidelines, considered by other Pain Management Practitioners as being common knowledge and practice, at the time of the intervention. For Medico-Legal purposes, it is also  important to point out that variation in procedural techniques and pharmacological choices are the acceptable norm. The indications, contraindications, technique, and results of the above procedure should only be interpreted and judged by a Board-Certified Interventional Pain Specialist with  extensive familiarity and expertise in the same exact procedure and technique.

## 2023-11-19 NOTE — Progress Notes (Signed)
 PROVIDER NOTE: Interpretation of information contained herein should be left to medically-trained personnel. Specific patient instructions are provided elsewhere under Patient Instructions section of medical record. This document was created in part using AI and STT-dictation technology, any transcriptional errors that may result from this process are unintentional.  Patient: Lindsay Austin  Service: E/M   PCP: Donnie Handing, PA  DOB: [birth year]  DOS: 11/19/2023  Provider: Wallie Sherry, MD  MRN: [id number]  Delivery: Face-to-face  Specialty: Interventional Pain Management  Type: Established Patient  Setting: Ambulatory outpatient facility  Specialty designation: 09  Referring Prov.: Donnie Handing, PA  Location: Outpatient office facility       History of present illness (HPI) [name], a 63 y.o. year old female, is here today because of her Chronic migraine without aura without status migrainosus, not intractable [G43.709]. [name] primary complain today is Back Pain and Hip Pain (low)  Pertinent problems: [name] has Hx of lumbosacral spine surgery; History of lumbar fusion (L5-S1 fusion); Failed spinal cord stimulator, sequela; Cervical fusion syndrome (C4-C7); Chronic pain syndrome; Major depressive disorder with current active episode; Lumbar spondylosis; Cervical spondylosis without myelopathy; and Spinal stenosis in cervical region on their pertinent problem list.  Pain Assessment: Severity of Chronic pain is reported as a 3 /10. Location: Back Lower/radiates into hips. Onset: More than a month ago. Quality:  (electryfying). Timing: Constant. Modifying factor(s): meds. Vitals:  height is 5' 4 (1.626 m) and weight is 173 lb (78.5 kg). Her temperature is 98.2 F (36.8 C). Her blood pressure is 115/77 and her pulse is 61. Her oxygen saturation is 97%.  BMI: Estimated body mass index is 29.7 kg/m as calculated from the following:   Height as of this encounter: 5' 4 (1.626 m).   Weight  as of this encounter: 173 lb (78.5 kg).  Last encounter: Visit date not found. Last procedure: 08/20/2023.  Reason for encounter:  Patient presents today for medication management.  She informs me that she will be moving to [place]  next month.  She is in the process of establishing with a pain management provider there.  She is having difficulty locating 1.  She has provided me with 2 clinics and is requesting that I send a referral there.  We discussed weaning her fentanyl  patches until she is able to establish with pain management in [place] .  I recommend that she decrease her dose down to 37 mcg an hour from 50 mcg an hour understanding that she will have increased pain and potential muscle spasms and irritability during this time.  She is aware.  She is also here for Botox  treatment for migraine management.  See attached procedure note   Pharmacotherapy Assessment    fentanyl  patch  50 mcg an hour; 120 MME. wean to 37 mcg an hour Monitoring: [place] PMP: PDMP reviewed during this encounter.       Pharmacotherapy: No side-effects or adverse reactions reported. Compliance: No problems identified. Effectiveness: Clinically acceptable.  Rebecka Wolm HERO, RN  11/19/2023 12:34 PM  Sign when Signing Visit Nursing Pain Medication Assessment:  Safety precautions to be maintained throughout the outpatient stay will include: orient to surroundings, keep bed in low position, maintain call bell within reach at all times, provide assistance with transfer out of bed and ambulation.  Medication Inspection Compliance: Pill count conducted under aseptic conditions, in front of the patient. Neither the pills nor the bottle was removed from the patient's sight at any time. Once count was completed pills were  immediately returned to the patient in their original bottle.  Medication: Fentanyl  patch Pill/Patch Count: 8 of 10 pills/patches remain Pill/Patch Appearance: Markings consistent with prescribed  medication Bottle Appearance: Standard pharmacy container. Clearly labeled. Filled Date: 07 / 18 / 2025 Last Medication intake:  11-17-2023 Safety precautions to be maintained throughout the outpatient stay will include: orient to surroundings, keep bed in low position, maintain call bell within reach at all times, provide assistance with transfer out of bed and ambulation.  UDS:  Summary  Date Value Ref Range Status  05/26/2023 FINAL  Final    Comment:    ==================================================================== [employer] Select 13 (MW) ==================================================================== Test                             Result       Flag       Units  Drug Present and Declared for Prescription Verification   Fentanyl                        28           EXPECTED   ng/mg creat   Norfentanyl                    684          EXPECTED   ng/mg creat    Source of fentanyl  is a scheduled prescription medication, including    IV, patch, and transmucosal formulations. Norfentanyl is an expected    metabolite of fentanyl .  ==================================================================== Test                      Result    Flag   Units      Ref Range   Creatinine              113              mg/dL      >=79 ==================================================================== Declared Medications:  The flagging and interpretation on this report are based on the  following declared medications.  Unexpected results may arise from  inaccuracies in the declared medications.   **Note: The testing scope of this panel includes these medications:   Fentanyl  (Duragesic )   **Note: The testing scope of this panel does not include the  following reported medications:   Albuterol  (Ventolin  HFA)  Atorvastatin (Lipitor)  Bupropion  (Wellbutrin )  Clopidogrel (Plavix)  Dicyclomine  (Bentyl )  Docusate  Methocarbamol  (Robaxin )  Naloxone  (Narcan )  Ondansetron  (Zofran )   Risperidone (Risperdal)  Rizatriptan  (Maxalt )  Sucralfate  (Carafate )  Trazodone  (Desyrel ) ==================================================================== For clinical consultation, please call [phone]. ====================================================================     No results found for: CBDTHCR No results found for: D8THCCBX No results found for: D9THCCBX  ROS  Constitutional: Denies any fever or chills Gastrointestinal: No reported hemesis, hematochezia, vomiting, or acute GI distress Musculoskeletal: Denies any acute onset joint swelling, redness, loss of ROM, or weakness Neurological: No reported episodes of acute onset apraxia, aphasia, dysarthria, agnosia, amnesia, paralysis, loss of coordination, or loss of consciousness  Medication Review  DSS, albuterol , atorvastatin, buPROPion , clopidogrel, dicyclomine , fentaNYL , methocarbamol , naloxone , ondansetron , risperiDONE, rizatriptan , sucralfate , and traZODone   History Review  Allergy: [name] is allergic to methadone, other, and tape. Drug: [name]  reports no history of drug use. Alcohol:  reports no history of alcohol use. Tobacco:  reports that she has never smoked. She has never used smokeless tobacco. Social: [name]  reports that she has never smoked. She has never used smokeless tobacco. She reports that she does not drink alcohol and does not use drugs. Medical:  has a past medical history of Anxiety, Arthritis, Bladder cancer (HCC), Cancer of kidney (HCC), DDD (degenerative disc disease), cervical, Depression, Failed back syndrome, Fibromyalgia, GERD (gastroesophageal reflux disease), Headache, Heart murmur, Hyperlipemia, Hypertension, IBS (irritable bowel syndrome), Kidney failure, Renal insufficiency, RSD (reflex sympathetic dystrophy), Seizure (HCC), Stomach ulcer, and Trigeminal neuralgia. Surgical: Ms. Kerper  has a past surgical history that includes Transurethral resection of bladder tumor  with gyrus (turbt-gyrus) (2012, 2013); Nephroureterectomy (2011); Back surgery (1995); Cholecystectomy; Brain surgery (2004); trigeminal neuralgia (2004); Spinal cord stimulator implant (2004); colonscopy (2006); Posterior cervical fusion/foraminotomy (Bilateral, 07/05/2019); Posterior cervical laminectomy (Bilateral, 07/05/2019); and Breast biopsy (Left, 2017). Family: family history includes Dementia in her father; Diabetes in her mother; Heart disease in her mother; Kidney disease in her father.  Laboratory Chemistry Profile   Renal Lab Results  Component Value Date   BUN 13 12/29/2020   CREATININE 1.44 (H) 12/29/2020   LABCREA 71 08/29/2019   GFRAA 51 (L) 08/31/2019   GFRNONAA 42 (L) 12/29/2020    Hepatic Lab Results  Component Value Date   AST 32 08/29/2019   ALT 12 08/29/2019   ALBUMIN 4.1 08/29/2019   ALKPHOS 66 08/29/2019    Electrolytes Lab Results  Component Value Date   NA 139 12/29/2020   K 4.3 12/29/2020   CL 101 12/29/2020   CALCIUM  9.2 12/29/2020   MG 2.2 08/31/2019    Bone No results found for: VD25OH, VD125OH2TOT, CI6874NY7, CI7874NY7, 25OHVITD1, 25OHVITD2, 25OHVITD3, TESTOFREE, TESTOSTERONE  Inflammation (CRP: Acute Phase) (ESR: Chronic Phase) No results found for: CRP, ESRSEDRATE, LATICACIDVEN       Note: Above Lab results reviewed.  Recent Imaging Review  DG PAIN CLINIC C-ARM 1-60 MIN NO REPORT Fluoro was used, but no Radiologist interpretation will be provided.  Please refer to NOTES tab for provider progress note. Note: Reviewed        Physical Exam  Vitals: BP 115/77   Pulse 61   Temp 98.2 F (36.8 C)   Ht 5' 4 (1.626 m)   Wt 173 lb (78.5 kg)   SpO2 97%   BMI 29.70 kg/m  BMI: Estimated body mass index is 29.7 kg/m as calculated from the following:   Height as of this encounter: 5' 4 (1.626 m).   Weight as of this encounter: 173 lb (78.5 kg). Ideal: Ideal body weight: 54.7 kg (120 lb 9.5 oz) Adjusted ideal body  weight: 64.2 kg (141 lb 8.9 oz) General appearance: Well nourished, well developed, and well hydrated. In no apparent acute distress Mental status: Alert, oriented x 3 (person, place, & time)       Respiratory: No evidence of acute respiratory distress Eyes: PERLA   Assessment   Diagnosis Status  1. Chronic migraine without aura without status migrainosus, not intractable   2. Failed spinal cord stimulator, sequela   3. History of lumbar fusion   4. Sacroiliac joint dysfunction of right side   5. Chronic pain syndrome    Controlled Controlled Controlled   Updated Problems: No problems updated.  Plan of Care  1.  Will place referrals to pain management in [place]  including [facility], [facility] in [facility] 2.  Wean fentanyl  patch from 50 mcg an hour to 37 mcg an hour 3 see attached procedure note for Botox  for migraine management  [name] has a current medication list which includes the following long-term medication(s): albuterol , atorvastatin, bupropion , dicyclomine , risperidone, rizatriptan , sucralfate , and trazodone .  Pharmacotherapy (Medications Ordered): Meds ordered this encounter  Medications   botulinum toxin Type A  (BOTOX ) injection 155 Units   fentaNYL  (DURAGESIC ) 12 MCG/HR    Sig: To be taken with a 25 mcg/hr patch for a total of 37 mcg/hr. Must last 30 days.    Dispense:  10 patch    Refill:  0    Chronic Pain: STOP Act (Not applicable) Fill 1 day early if closed on refill date. Avoid benzodiazepines within 8 hours of opioids   fentaNYL  (DURAGESIC ) 25 MCG/HR    Sig: To be taken with a 12mcg/hr patch for a total of 37 mcg/hr. Must last 30 days.    Dispense:  10 patch    Refill:  0    Chronic Pain: STOP Act (Not applicable) Fill 1 day early if closed on refill date. Avoid benzodiazepines within 8 hours of opioids    Return for patient will call to schedule F2F appt prn.    Recent Visits No  visits were found meeting these conditions. Showing recent visits within past 90 days and meeting all other requirements Today's Visits Date Type Provider Dept  11/19/23 Procedure visit Marcelino Nurse, MD [facility]  Showing today's visits and meeting all other requirements Future Appointments No visits were found meeting these conditions. Showing future appointments within next 90 days and meeting all other requirements  I discussed the assessment and treatment plan with the patient. The patient was provided an opportunity to ask questions and all were answered. The patient agreed with the plan and demonstrated an understanding of the instructions.  Patient advised to call back or seek an in-person evaluation if the symptoms or condition worsens.  Duration of encounter: 45 minutes.  Total time on encounter, as per AMA guidelines included both the face-to-face and non-face-to-face time personally spent by the physician and/or other qualified health care professional(s) on the day of the encounter (includes time in activities that require the physician or other qualified health care professional and does not include time in activities normally performed by clinical staff). Physician's time may include the following activities when performed: Preparing to see the patient (e.g., pre-charting review of records, searching for previously ordered imaging, lab work, and nerve conduction tests) Review of prior analgesic pharmacotherapies. Reviewing PMP Interpreting ordered tests (e.g., lab work, imaging, nerve conduction tests) Performing post-procedure evaluations, including interpretation of diagnostic procedures Obtaining and/or reviewing separately obtained history Performing a medically appropriate examination and/or evaluation Counseling and educating the patient/family/caregiver Ordering medications, tests, or procedures Referring and communicating with other health care  professionals (when not separately reported) Documenting clinical information in the electronic or other health record Independently interpreting results (not separately reported) and communicating results to the patient/ family/caregiver Care coordination (not separately reported)  Note by: Nurse Marcelino, MD (TTS and AI technology used. I apologize for any typographical errors that were not detected and corrected.) Date: 11/19/2023; Time: 1:11 PM

## 2023-11-19 NOTE — Progress Notes (Signed)
 Nursing Pain Medication Assessment:  Safety precautions to be maintained throughout the outpatient stay will include: orient to surroundings, keep bed in low position, maintain call bell within reach at all times, provide assistance with transfer out of bed and ambulation.  Medication Inspection Compliance: Pill count conducted under aseptic conditions, in front of the patient. Neither the pills nor the bottle was removed from the patient's sight at any time. Once count was completed pills were immediately returned to the patient in their original bottle.  Medication: Fentanyl  patch Pill/Patch Count: 8 of 10 pills/patches remain Pill/Patch Appearance: Markings consistent with prescribed medication Bottle Appearance: Standard pharmacy container. Clearly labeled. Filled Date: 07 / 18 / 2025 Last Medication intake:  11-17-2023 Safety precautions to be maintained throughout the outpatient stay will include: orient to surroundings, keep bed in low position, maintain call bell within reach at all times, provide assistance with transfer out of bed and ambulation.

## 2023-11-20 ENCOUNTER — Telehealth: Payer: Self-pay

## 2023-11-20 NOTE — Telephone Encounter (Signed)
 No issues post botox  procedure.

## 2023-12-01 ENCOUNTER — Telehealth: Payer: Self-pay

## 2023-12-01 NOTE — Telephone Encounter (Signed)
 Will speak back to patient in the morning to see if she has any information from medicaid and if not we will call medicaid to find out why she is locked with medicaid.

## 2023-12-01 NOTE — Telephone Encounter (Signed)
 Called pharm and they state that they cannot run the insurance because the patient is locked out.  It was Written by Marcelino. Instructed them they she is to wear both patches for a total of 37mcg per order.

## 2023-12-01 NOTE — Telephone Encounter (Signed)
 Called to [facility] to find out why they are unable to fill medication.  The patient is locked in with a provider and medicaid will not allow medications to be filled by any other provider.  [facility] is unable to tell me which [facility] is.  States that fentanyl  was prescribed by Tobie earlier and maybe that is the problem.  Will attempt to call medicaid tomorrow to find out exactly what the problem is.    Called patient to let her know what is going on.  Will call patient back in the morning to see what info she has and if none we will call medicaid and find out.

## 2023-12-01 NOTE — Telephone Encounter (Signed)
 The pharmacy can't fill her script for fentanyl  25 mg and 12mg  until the doctor contacts the pharmacy and let them know it is for titration. [facility] [address] [facility].

## 2023-12-02 ENCOUNTER — Other Ambulatory Visit: Payer: Self-pay | Admitting: *Deleted

## 2023-12-02 MED ORDER — FENTANYL 25 MCG/HR TD PT72: MEDICATED_PATCH | TRANSDERMAL | 0 refills | Status: AC

## 2023-12-02 MED ORDER — FENTANYL 12 MCG/HR TD PT72
MEDICATED_PATCH | TRANSDERMAL | 0 refills | Status: AC
Start: 2023-12-02 — End: ?

## 2023-12-02 NOTE — Telephone Encounter (Signed)
 Patient aware that meds ;have been resent.

## 2023-12-03 ENCOUNTER — Telehealth: Payer: Self-pay | Admitting: Nurse Practitioner

## 2023-12-03 NOTE — Telephone Encounter (Signed)
 Pharmacy told patient they need clarification on Fentanyl  script recently sent in

## 2023-12-03 NOTE — Telephone Encounter (Signed)
 Called [facility] and gave information that were asking for.  DX code and tried and failed medications.

## 2023-12-31 ENCOUNTER — Telehealth: Payer: Self-pay | Admitting: *Deleted

## 2023-12-31 ENCOUNTER — Telehealth: Payer: Self-pay | Admitting: Student in an Organized Health Care Education/Training Program

## 2023-12-31 NOTE — Telephone Encounter (Signed)
 PT has moved out of stated will like for Lateef to send a referral to [facility]. Fax no is [phone]. Please give patient a call. TY

## 2024-01-05 ENCOUNTER — Other Ambulatory Visit: Payer: Self-pay | Admitting: Nurse Practitioner
